# Patient Record
Sex: Female | Born: 1950 | Race: Black or African American | Hispanic: No | Marital: Single | Smoking: Former smoker
Health system: Southern US, Community
[De-identification: ages and names within clinical notes are randomized; demographics above are authoritative.]

## PROBLEM LIST (undated history)

## (undated) DIAGNOSIS — G629 Polyneuropathy, unspecified: Secondary | ICD-10-CM

## (undated) DIAGNOSIS — R0789 Other chest pain: Secondary | ICD-10-CM

## (undated) DIAGNOSIS — I10 Essential (primary) hypertension: Secondary | ICD-10-CM

## (undated) DIAGNOSIS — Z0000 Encounter for general adult medical examination without abnormal findings: Secondary | ICD-10-CM

## (undated) DIAGNOSIS — E1140 Type 2 diabetes mellitus with diabetic neuropathy, unspecified: Secondary | ICD-10-CM

## (undated) DIAGNOSIS — H409 Unspecified glaucoma: Secondary | ICD-10-CM

## (undated) DIAGNOSIS — F32A Depression, unspecified: Secondary | ICD-10-CM

## (undated) DIAGNOSIS — E785 Hyperlipidemia, unspecified: Secondary | ICD-10-CM

## (undated) DIAGNOSIS — A419 Sepsis, unspecified organism: Secondary | ICD-10-CM

## (undated) DIAGNOSIS — S82142A Displaced bicondylar fracture of left tibia, initial encounter for closed fracture: Secondary | ICD-10-CM

## (undated) DIAGNOSIS — Z1159 Encounter for screening for other viral diseases: Secondary | ICD-10-CM

## (undated) DIAGNOSIS — Z594 Lack of adequate food and safe drinking water: Secondary | ICD-10-CM

## (undated) DIAGNOSIS — Z1239 Encounter for other screening for malignant neoplasm of breast: Secondary | ICD-10-CM

## (undated) DIAGNOSIS — Z794 Long term (current) use of insulin: Principal | ICD-10-CM

## (undated) DIAGNOSIS — I219 Acute myocardial infarction, unspecified: Secondary | ICD-10-CM

## (undated) DIAGNOSIS — L0231 Cutaneous abscess of buttock: Secondary | ICD-10-CM

## (undated) DIAGNOSIS — M810 Age-related osteoporosis without current pathological fracture: Secondary | ICD-10-CM

## (undated) DIAGNOSIS — E1165 Type 2 diabetes mellitus with hyperglycemia: Principal | ICD-10-CM

## (undated) HISTORY — DX: Encounter for general adult medical examination without abnormal findings: Z00.00

## (undated) HISTORY — DX: Encounter for other screening for malignant neoplasm of breast: Z12.39

## (undated) HISTORY — DX: Polyneuropathy, unspecified: G62.9

## (undated) HISTORY — DX: Displaced bicondylar fracture of left tibia, initial encounter for closed fracture: S82.142A

## (undated) HISTORY — PX: EYE SURGERY: SHX253

## (undated) HISTORY — DX: Acute myocardial infarction, unspecified: I21.9

## (undated) HISTORY — DX: Hyperlipidemia, unspecified: E78.5

## (undated) HISTORY — DX: Long term (current) use of insulin: Z79.4

## (undated) HISTORY — DX: Encounter for screening for other viral diseases: Z11.59

## (undated) HISTORY — DX: Unspecified glaucoma: H40.9

## (undated) HISTORY — DX: Sepsis, unspecified organism: A41.9

## (undated) HISTORY — DX: Lack of adequate food and safe drinking water: Z59.4

## (undated) HISTORY — DX: Depression, unspecified: F32.A

## (undated) HISTORY — DX: Other chest pain: R07.89

## (undated) HISTORY — DX: Cutaneous abscess of buttock: L02.31

## (undated) HISTORY — DX: Type 2 diabetes mellitus with hyperglycemia: E11.65

## (undated) HISTORY — DX: Age-related osteoporosis without current pathological fracture: M81.0

## (undated) HISTORY — DX: Type 2 diabetes mellitus with diabetic neuropathy, unspecified: E11.40

---

## 2006-03-10 ENCOUNTER — Encounter: Admission: RE | Admit: 2006-03-10 | Discharge: 2006-03-10 | Payer: Self-pay | Admitting: Internal Medicine

## 2006-03-25 ENCOUNTER — Encounter: Admission: RE | Admit: 2006-03-25 | Discharge: 2006-03-25 | Payer: Self-pay | Admitting: Internal Medicine

## 2006-10-15 ENCOUNTER — Observation Stay (HOSPITAL_COMMUNITY): Admission: EM | Admit: 2006-10-15 | Discharge: 2006-10-16 | Payer: Self-pay | Admitting: Emergency Medicine

## 2006-12-31 ENCOUNTER — Encounter: Admission: RE | Admit: 2006-12-31 | Discharge: 2006-12-31 | Payer: Self-pay | Admitting: Internal Medicine

## 2008-09-23 ENCOUNTER — Emergency Department (HOSPITAL_COMMUNITY): Admission: EM | Admit: 2008-09-23 | Discharge: 2008-09-24 | Payer: Self-pay | Admitting: Emergency Medicine

## 2009-01-17 ENCOUNTER — Emergency Department (HOSPITAL_COMMUNITY): Admission: EM | Admit: 2009-01-17 | Discharge: 2009-01-17 | Payer: Self-pay | Admitting: Emergency Medicine

## 2010-03-18 ENCOUNTER — Encounter: Payer: Self-pay | Admitting: Internal Medicine

## 2010-06-03 LAB — DIFFERENTIAL
Band Neutrophils: 0 % (ref 0–10)
Basophils Relative: 0 % (ref 0–1)
Blasts: 0 %
Lymphs Abs: 1.7 10*3/uL (ref 0.7–4.0)
Neutrophils Relative %: 71 % (ref 43–77)
Promyelocytes Absolute: 0 %

## 2010-06-03 LAB — GLUCOSE, CAPILLARY
Glucose-Capillary: 245 mg/dL — ABNORMAL HIGH (ref 70–99)
Glucose-Capillary: 272 mg/dL — ABNORMAL HIGH (ref 70–99)

## 2010-06-03 LAB — POCT CARDIAC MARKERS
CKMB, poc: <1 ng/mL — ABNORMAL LOW (ref 1.0–8.0)
CKMB, poc: <1 ng/mL — ABNORMAL LOW (ref 1.0–8.0)
Myoglobin, poc: 40.6 ng/mL (ref 12–200)
Myoglobin, poc: 51.9 ng/mL (ref 12–200)
Troponin i, poc: <0.05 ng/mL (ref 0.00–0.09)

## 2010-06-03 LAB — BASIC METABOLIC PANEL
CO2: 25 mEq/L (ref 19–32)
GFR calc Af Amer: >60 mL/min (ref >60)
GFR calc non Af Amer: >60 mL/min (ref >60)
Sodium: 136 mEq/L (ref 135–145)

## 2010-06-03 LAB — CBC
Hemoglobin: 14.5 g/dL (ref 12.0–15.0)
MCHC: 33.8 g/dL (ref 30.0–36.0)
MCV: 85 fL (ref 78.0–100.0)
Platelets: 242 10*3/uL (ref 150–400)
WBC: 7.8 10*3/uL (ref 4.0–10.5)

## 2010-06-03 LAB — BRAIN NATRIURETIC PEPTIDE: Pro B Natriuretic peptide (BNP): <30 pg/mL (ref 0.0–100.0)

## 2010-07-10 NOTE — Discharge Summary (Signed)
Heather [name], Heather [name]                  ACCOUNT NO.:  [id number]   MEDICAL RECORD NO.:  [id number]          PATIENT TYPE:  INP   LOCATION:  3733                         FACILITY:  [facility]   PHYSICIAN:  Hillery Aldo, M.D.   DATE OF BIRTH:  07-28-1950   DATE OF ADMISSION:  10/14/2006  DATE OF DISCHARGE:  10/16/2006                               DISCHARGE SUMMARY   PRIMARY CARE PHYSICIAN:  Robyn N. Allyne Gee, M.D.   DISCHARGE DIAGNOSES:  1. Noncardiac chest pain.  2. Poorly controlled diabetes.  3. Hypertension.  4. Mild dyslipidemia.  5. Obesity.   DISCHARGE MEDICATIONS:  1. Aspirin 81 mg daily.  2. Janumet 50/500 1 tablet daily.  3. Hydrochlorothiazide 12.5 mg daily.  4. Glucotrol XL 10 mg daily.   CONSULTATIONS:  None.   BRIEF ADMISSION HISTORY AND PHYSICAL:  The patient is a 60 year old  female who presented to the hospital for evaluation secondary to chest  pain and dyspnea.  Chest pain was somewhat atypical and mild, rated at  2/10.  Dyspnea seemed to be precipitated by an argument with family  members and a bit of exertional activity.  Nevertheless, the patient was  concerned and presented to the hospital for evaluation, and due to her  risk factor profile, was admitted to rule out acute coronary syndrome.   PROCEDURES AND DIAGNOSTIC STUDIES:  Chest x-ray on October 14, 2006  showed no acute findings.   DISCHARGE LABORATORY DATA:  CBC and BMET were unremarkable.  D-dimer was  not elevated at 0.24.  Total cholesterol was 198, triglycerides 126, HDL  46, LDL 127.  Cardiac markers were negative, with the exception of  slight elevation of the total CK.  The MB fractions and troponin I were  negative for three sets.  Hemoglobin A1c was 9.7%.  TSH was 2.676.   HOSPITAL COURSE BY PROBLEM:  Problem 1.  Chest pain.  The patient's  chest pain was fairly atypical.  Nevertheless, she was started on  aspirin and put on Nitropaste.  Serial EKG readings were nonacute.  There were no  abnormalities noted on telemetry.  The patient was given a  fasting lipid panel to further risk stratify her and did have some mild  dyslipidemia.  It is felt that her chest pain was likely noncardiac and  atypical in nature, and after three sets of negative enzymes, the  patient was deemed stable for discharge.  She had no further complaints  of chest pain or dyspnea throughout the course of her hospitalization.  There was no evidence of pulmonary embolism, given her negative D-dimer,  or pneumothorax, given her chest x-ray findings.  Nevertheless, she does  have some risk factors, and we do recommend an outpatient Cardiolite or  stress test as determined by her primary care physician.   Problem 2.  Diabetes.  The patient has poor overall control of her  diabetes, given her hemoglobin A1c of 9.7%.  The patient states that she  had been off of treatment for approximately 3 years and only recently  reestablish treatment with a primary care physician.  She  was just  started on Janumet recently.  We will go ahead and add Glucotrol XL,  given her elevated blood glucose readings.   Problem 3.  Hypertension.  The patient's blood pressure is controlled on  her current dose of hydrochlorothiazide.   Problem 4.  Dyspnea.  The patient's dyspnea was felt to be due to  exertion in the setting of emotional upset.  She has had no further  complaints of dyspnea.  There is no evidence of pulmonary embolism or an  acute lung process.  Her lungs are clear by clinical exam.   Problem 5.  Mild dyslipidemia.  The patient's total cholesterol was 198  but her LDL does warrant treatment.  We will leave this to the  discretion of her primary care physician.   DISPOSITION:  The patient is stable for discharge home.  She is  instructed to call her primary care physician and set up a followup  hospital appointment with her for early next week.      Hillery Aldo, M.D.  Electronically Signed      CR/MEDQ  D:  10/16/2006  T:  10/17/2006  Job:  [id number]   cc:   Candyce Churn. Allyne Gee, M.D.

## 2010-07-10 NOTE — H&P (Signed)
Heather [name], Heather [name]                  ACCOUNT NO.:  [id number]   MEDICAL RECORD NO.:  [id number]          PATIENT TYPE:  INP   LOCATION:  3733                         FACILITY:  [facility]   PHYSICIAN:  Della Goo, M.D. DATE OF BIRTH:  09-21-1950   DATE OF ADMISSION:  10/14/2006  DATE OF DISCHARGE:                              HISTORY & PHYSICAL   ADDENDUM:   PHYSICAL EXAMINATION FINDINGS:  This is a 60 year old obese female in no  acute distress.  VITAL SIGNS:  Temperature 97.5, blood pressure 150/95, heart rate 104,  respirations 16, O2 saturations 95-96%.  HEENT EXAMINATION:  Normocephalic, atraumatic.  Pupils equally round,  reactive to light.  There is no scleral icterus.  Funduscopic benign.  Oropharynx is clear.  NECK:  Supple, full range of motion.  No thyromegaly, adenopathy,  jugular venous distention.  CARDIOVASCULAR:  Regular rate and rhythm.  No murmurs, gallops or rubs.  LUNGS:  Clear to auscultation bilaterally.  ABDOMEN:  Positive bowel sounds, soft, nontender, nondistended.  EXTREMITIES:  Without cyanosis, clubbing or edema.  NEUROLOGIC EXAMINATION:  Alert and oriented x3.  Nonfocal.   LABORATORY STUDIES:  Sodium 139, potassium 4.2, chloride 105,  bicarbonate 29, BUN 16, creatinine 0.7, glucose 204.  Hemoglobin 15.3,  hematocrit 45.0.  Cardiac enzymes:  Myoglobin 143, CK-MB 2.7, troponin  less than 0.05.  Pro time 12.7, INR 0.9.  EKG reveals a normal sinus  rhythm without acute ST-segment changes.  Chest x-ray reveals no active  disease process.   ASSESSMENT:  A 60 year old female being admitted with:   1. Shortness of breath.  2. Chest pain.  3. Type 2 diabetes mellitus.  4. Hypertension.   PLAN:  The patient will be admitted to a telemetry area and cardiac  enzymes will be performed.  Topical nitrates, aspirin, and oxygen  therapy have been ordered.  A D-dimer has also been ordered.  The  patient will continue on her regular medications except the Janumet  has  been held secondary to the metformin.  Sliding scale insulin coverage  has been ordered as needed.  DVT and GI prophylaxis also ordered.  Further workup will ensue pending results of the patient's studies.     Della Goo, M.D.  Electronically Signed    HJ/MEDQ  D:  10/15/2006  T:  10/17/2006  Job:  [id number]   cc:   Candyce Churn. Allyne Gee, M.D.

## 2010-07-10 NOTE — H&P (Signed)
Heather [name], Heather [name]                  ACCOUNT NO.:  [id number]   MEDICAL RECORD NO.:  [id number]          PATIENT TYPE:  INP   LOCATION:  3733                         FACILITY:  [facility]   PHYSICIAN:  Della Goo, M.D. DATE OF BIRTH:  1951-01-17   DATE OF ADMISSION:  10/14/2006  DATE OF DISCHARGE:                              HISTORY & PHYSICAL   PRIMARY CARE PHYSICIAN:  Dr. Dorothyann Peng.   CHIEF COMPLAINT:  Shortness of breath.   HISTORY OF PRESENT ILLNESS:  This is a 60 year old female presenting to  the emergency department secondary to complaints of severe shortness of  breath that started in the afternoon after excitement in her home.  The  patient reports having increased stressors and beginning to be severely  short of breath.  She was brought to the emergency department for  evaluation and subsequently began to have chest pain, which she  described as being mild, substernal, and rated the pain as being a 2/10/  she denies having any radiation of the pain.  She does have risk factors  of diabetes type 2 and hypertension along with a family history of  coronary artery disease, hypertension, diabetes in her mother.  The  patient is a nonsmoker.  She denies having any symptoms of nausea,  vomiting, diarrhea.  She denies having any syncope or seizure symptoms.  She does report having mild weakness.   PAST MEDICAL HISTORY:  1. History of type 2 diabetes mellitus.  2. Hypertension.   MEDICATIONS:  1. Hydrochlorothiazide 25 mg one p.o. daily.  2. Janumet 500/50 mg one p.o. q.p.m.   ALLERGIES:  No known drug allergies.   SOCIAL HISTORY:  The patient is at home with five adult children.  She  is a nonsmoker.  She reports having occasional rare alcohol, drinking  one mixed drink monthly.   FAMILY HISTORY:  As mentioned above.   REVIEW OF SYSTEMS:  Pertinents are mentioned above.   PHYSICAL EXAMINATION FINDINGS:  This is an obese 60 year old female in  no acute distress.  VITAL SIGNS:  Temperature   Dictation ended at this point.      Della Goo, M.D.  Electronically Signed     HJ/MEDQ  D:  10/15/2006  T:  10/17/2006  Job:  [id number]   cc:   Candyce Churn. Allyne Gee, M.D.

## 2010-12-07 LAB — CBC
MCV: 82.8
RBC: 4.39
WBC: 7.7

## 2010-12-07 LAB — CK TOTAL AND CKMB (NOT AT ARMC)
CK, MB: 3
Relative Index: 1.2
Total CK: 246 — ABNORMAL HIGH

## 2010-12-07 LAB — BASIC METABOLIC PANEL
BUN: 10
Chloride: 107
GFR calc Af Amer: >60
Potassium: 4.1
Sodium: 140

## 2010-12-07 LAB — CARDIAC PANEL(CRET KIN+CKTOT+MB+TROPI)
CK, MB: 2
Total CK: 266 — ABNORMAL HIGH
Total CK: 324 — ABNORMAL HIGH
Troponin I: 0.02

## 2010-12-07 LAB — POCT CARDIAC MARKERS: CKMB, poc: 2.7

## 2010-12-07 LAB — I-STAT 8, (EC8 V) (CONVERTED LAB)
BUN: 16
Glucose, Bld: 204 — ABNORMAL HIGH
HCT: 45
Operator id: 272551
pH, Ven: 7.371 — ABNORMAL HIGH

## 2010-12-07 LAB — HEMOGLOBIN A1C
Hgb A1c MFr Bld: 9.7 — ABNORMAL HIGH
Mean Plasma Glucose: 268

## 2010-12-07 LAB — LIPID PANEL
Cholesterol: 198
HDL: 46
Total CHOL/HDL Ratio: 4.3

## 2010-12-07 LAB — POCT I-STAT CREATININE: Operator id: 272551

## 2011-02-02 ENCOUNTER — Emergency Department (HOSPITAL_COMMUNITY)
Admission: EM | Admit: 2011-02-02 | Discharge: 2011-02-03 | Disposition: A | Discharge status: Home / Self Care | Payer: Managed Care, Other (non HMO) | Attending: Emergency Medicine | Admitting: Emergency Medicine

## 2011-02-02 ENCOUNTER — Other Ambulatory Visit: Payer: Self-pay

## 2011-02-02 ENCOUNTER — Encounter: Payer: Self-pay | Admitting: *Deleted

## 2011-02-02 DIAGNOSIS — R05 Cough: Secondary | ICD-10-CM | POA: Insufficient documentation

## 2011-02-02 DIAGNOSIS — R0602 Shortness of breath: Secondary | ICD-10-CM | POA: Insufficient documentation

## 2011-02-02 DIAGNOSIS — I10 Essential (primary) hypertension: Secondary | ICD-10-CM | POA: Insufficient documentation

## 2011-02-02 DIAGNOSIS — E119 Type 2 diabetes mellitus without complications: Secondary | ICD-10-CM | POA: Insufficient documentation

## 2011-02-02 DIAGNOSIS — R079 Chest pain, unspecified: Secondary | ICD-10-CM | POA: Insufficient documentation

## 2011-02-02 DIAGNOSIS — R059 Cough, unspecified: Secondary | ICD-10-CM | POA: Insufficient documentation

## 2011-02-02 HISTORY — DX: Essential (primary) hypertension: I10

## 2011-02-02 NOTE — ED Notes (Signed)
Patient with mild mid sternal chest pain that started earlier this afternoon at church.  Patient denies any other associated symptoms.

## 2011-02-03 ENCOUNTER — Encounter (HOSPITAL_COMMUNITY): Payer: Self-pay | Admitting: *Deleted

## 2011-02-03 ENCOUNTER — Emergency Department (HOSPITAL_COMMUNITY): Payer: Managed Care, Other (non HMO)

## 2011-02-03 LAB — BASIC METABOLIC PANEL
CO2: 26 mEq/L (ref 19–32)
Calcium: 9.4 mg/dL (ref 8.4–10.5)
GFR calc Af Amer: >90 mL/min (ref >90)
GFR calc non Af Amer: >90 mL/min (ref >90)
Sodium: 139 mEq/L (ref 135–145)

## 2011-02-03 LAB — CBC
MCH: 26.4 pg (ref 26.0–34.0)
Platelets: 256 10*3/uL (ref 150–400)
RBC: 4.92 MIL/uL (ref 3.87–5.11)

## 2011-02-03 LAB — POCT I-STAT TROPONIN I: Troponin i, poc: 0.01 ng/mL (ref 0.00–0.08)

## 2011-02-03 LAB — TROPONIN I: Troponin I: <0.3 ng/mL (ref <0.30)

## 2011-02-03 MED ORDER — HYDROCHLOROTHIAZIDE 12.5 MG PO CAPS
25.0000 mg | ORAL_CAPSULE | ORAL | Status: DC
  Filled 2011-02-03: qty 2

## 2011-02-03 MED ORDER — HYDROCHLOROTHIAZIDE 25 MG PO TABS
25.0000 mg | ORAL_TABLET | ORAL | Status: AC
  Administered 2011-02-03: 25 mg via ORAL
  Filled 2011-02-03 (×2): qty 1

## 2011-02-03 MED ORDER — METFORMIN HCL 500 MG PO TABS: 500.0000 mg | ORAL_TABLET | Freq: Two times a day (BID) | ORAL | Status: DC

## 2011-02-03 MED ORDER — HYDROCHLOROTHIAZIDE 25 MG PO TABS: 25.0000 mg | ORAL_TABLET | Freq: Every day | ORAL | Status: DC

## 2011-02-03 MED ORDER — METFORMIN HCL 500 MG PO TABS
500.0000 mg | ORAL_TABLET | ORAL | Status: AC
  Administered 2011-02-03: 500 mg via ORAL
  Filled 2011-02-03 (×2): qty 1

## 2011-02-03 NOTE — ED Provider Notes (Signed)
History     CSN: [id number] Arrival date & time: 02/02/2011 10:47 PM   First MD Initiated Contact with Patient 02/03/11 0151      Chief Complaint  Patient presents with  . Chest Pain    (Consider location/radiation/quality/duration/timing/severity/associated sxs/prior treatment) HPI This is a 60 year old black female with a history of hypertension and diabetes for which she has not receiving treatment. She had the onset of a mild left parasternal chest pain yesterday evening about 10 PM while at church. It was well localized and there was no radiation. There was no accompanying dyspnea, diaphoresis or nausea. There were no exacerbating or mitigating factors. It resolved on its own after about 2 hours. She denies other symptoms.  Past Medical History  Diagnosis Date  . Hypertension   . Diabetes mellitus     History reviewed. No pertinent past surgical history.  History reviewed. No pertinent family history.  History  Substance Use Topics  . Smoking status: Never Smoker   . Smokeless tobacco: Not on file  . Alcohol Use: No    OB History    Grav Para Term Preterm Abortions TAB SAB Ect Mult Living                  Review of Systems  All other systems reviewed and are negative.    Allergies  Review of patient's allergies indicates no known allergies.  Home Medications  No current outpatient prescriptions on file.  BP 158/96  Pulse 60  Temp(Src) 97.8 F (36.6 C) (Oral)  Resp 20  SpO2 96%  Physical Exam General: Well-developed, well-nourished female in no acute distress; appearance consistent with age of record HENT: normocephalic, atraumatic Eyes: pupils equal round and reactive to light; extraocular muscles intact Neck: supple Heart: regular rate and rhythm Lungs: clear to auscultation bilaterally Abdomen: soft; nontender; nondistended Extremities: No deformity; full range of motion; pulses normal Neurologic: Awake, alert and oriented; motor function  intact in all extremities and symmetric; no facial droop Skin: Warm and dry Psychiatric: Normal mood and affect    ED Course  Procedures (including critical care time)    MDM   Nursing notes and vitals signs, including pulse oximetry, reviewed.  Summary of this visit's results, reviewed by myself:  Labs:  Results for orders placed during the hospital encounter of 02/02/11  CBC      Component Value Range   WBC 7.0  4.0 - 10.5 (K/uL)   RBC 4.92  3.87 - 5.11 (MIL/uL)   Hemoglobin 13.0  12.0 - 15.0 (g/dL)   HCT 40.9  81.1 - 91.4 (%)   MCV 84.3  78.0 - 100.0 (fL)   MCH 26.4  26.0 - 34.0 (pg)   MCHC 31.3  30.0 - 36.0 (g/dL)   RDW 78.2  95.6 - 21.3 (%)   Platelets 256  150 - 400 (K/uL)  BASIC METABOLIC PANEL      Component Value Range   Sodium 139  135 - 145 (mEq/L)   Potassium 4.2  3.5 - 5.1 (mEq/L)   Chloride 104  96 - 112 (mEq/L)   CO2 26  19 - 32 (mEq/L)   Glucose, Bld 255 (*) 70 - 99 (mg/dL)   BUN 15  6 - 23 (mg/dL)   Creatinine, Ser 0.86  0.50 - 1.10 (mg/dL)   Calcium 9.4  8.4 - 57.8 (mg/dL)   GFR calc non Af Amer >90  >90 (mL/min)   GFR calc Af Amer >90  >90 (mL/min)  TROPONIN I  Component Value Range   Troponin I <0.30  <0.30 (ng/mL)  POCT I-STAT TROPONIN I      Component Value Range   Troponin i, poc 0.01  0.00 - 0.08 (ng/mL)   Comment 3           POCT I-STAT TROPONIN I      Component Value Range   Troponin i, poc 0.04  0.00 - 0.08 (ng/mL)   Comment 3           POCT I-STAT TROPONIN I      Component Value Range   Troponin i, poc 0.00  0.00 - 0.08 (ng/mL)   Comment 3             Imaging Studies: Dg Chest 2 View  02/03/2011  *RADIOLOGY REPORT*  Clinical Data: Chest pain, shortness of breath, and cough for 2 weeks.  CHEST - 2 VIEW  Comparison: 09/23/2008  Findings: The heart size and pulmonary vascularity are normal. The lungs appear clear and expanded without focal air space disease or consolidation. No blunting of the costophrenic angles.  Tortuous  aorta.  Degenerative changes in the spine.  No significant change since previous study.  IMPRESSION: No evidence of active pulmonary disease.  Original Report Authenticated By: Marlon Pel, M.D.   EKG Interpretation:  Date & Time: 02/03/2011 11:04 PM  Rate: 66  Rhythm: normal sinus rhythm  QRS Axis: normal  Intervals: normal  ST/T Wave abnormalities: normal  Conduction Disutrbances:none  Narrative Interpretation: abnormal R-wave progression  Old EKG Reviewed: unchanged  5:41 AM 3 sets of cardiac markers within normal limits. Patient has been asymptomatic in the ED. Will start patient on antihypertensive antihyperglycemic medications refer to [facility] that she needs establishment with a primary care physician. She was advised to return for worsening symptoms.      Hanley Seamen, MD 02/03/11 [phone]

## 2011-02-03 NOTE — ED Notes (Signed)
Comfort measures provided for family at bedside    

## 2011-02-03 NOTE — ED Notes (Signed)
CP currently resolved

## 2011-02-03 NOTE — ED Notes (Signed)
Pt shows no sign of neuro deficits

## 2011-11-04 ENCOUNTER — Encounter (HOSPITAL_COMMUNITY): Payer: Self-pay | Admitting: Family Medicine

## 2011-11-04 ENCOUNTER — Emergency Department (HOSPITAL_COMMUNITY)
Admission: EM | Admit: 2011-11-04 | Discharge: 2011-11-04 | Disposition: A | Discharge status: Home / Self Care | Payer: Managed Care, Other (non HMO) | Attending: Emergency Medicine | Admitting: Emergency Medicine

## 2011-11-04 DIAGNOSIS — I10 Essential (primary) hypertension: Secondary | ICD-10-CM | POA: Insufficient documentation

## 2011-11-04 DIAGNOSIS — L237 Allergic contact dermatitis due to plants, except food: Secondary | ICD-10-CM

## 2011-11-04 DIAGNOSIS — L255 Unspecified contact dermatitis due to plants, except food: Secondary | ICD-10-CM | POA: Insufficient documentation

## 2011-11-04 DIAGNOSIS — L259 Unspecified contact dermatitis, unspecified cause: Secondary | ICD-10-CM

## 2011-11-04 DIAGNOSIS — E119 Type 2 diabetes mellitus without complications: Secondary | ICD-10-CM | POA: Insufficient documentation

## 2011-11-04 MED ORDER — DEXAMETHASONE SODIUM PHOSPHATE 10 MG/ML IJ SOLN
10.0000 mg | Freq: Once | INTRAMUSCULAR | Status: AC
  Administered 2011-11-04: 10 mg via INTRAMUSCULAR
  Filled 2011-11-04: qty 1

## 2011-11-04 MED ORDER — PREDNISONE 20 MG PO TABS: ORAL_TABLET | ORAL | Status: AC

## 2011-11-04 MED ORDER — DIPHENHYDRAMINE HCL 25 MG PO CAPS
25.0000 mg | ORAL_CAPSULE | Freq: Once | ORAL | Status: AC
  Administered 2011-11-04: 25 mg via ORAL
  Filled 2011-11-04: qty 1

## 2011-11-04 NOTE — ED Notes (Signed)
Pt reports rash all over x 1 month. States she thinks it is poison oak or ivy, but is unsure. Reports severe itching.

## 2011-11-04 NOTE — ED Notes (Signed)
Rx given x1 Pt ambulating independently w/ steady gait on d/c in no acute distress, A&Ox4. D/c instructions reviewed w/ pt - pt denies any further questions or concerns at present.   

## 2011-11-04 NOTE — ED Provider Notes (Signed)
History     CSN: [id number]  Arrival date & time 11/04/11  1619   First MD Initiated Contact with Patient 11/04/11 1755      Chief Complaint  Patient presents with  . Rash    (Consider location/radiation/quality/duration/timing/severity/associated sxs/prior treatment) HPI Comments: 61 year old female presents to the emergency department with a rash on her arms, chest and abdomen that has come and gone for the past month. She states that one month ago she was working out in her yard where she has poison oak and developed a rash on her arms, chest and abdomen. The rash was very itchy. She tried taking Benadryl and applying A&D ointment without much relief. The rash began to go away until she started to work in her yard again a little over a week ago. Rash came back in the exact same spot. It is itchy but not painful. Denies any fever, chills, shortness of breath, trouble swallowing. Denies any new soaps, detergents, pets, recent travel or any contacts with similar rash.  Patient is a 61 y.o. female presenting with rash. The history is provided by the patient and a friend.  Rash     Past Medical History  Diagnosis Date  . Hypertension   . Diabetes mellitus     History reviewed. No pertinent past surgical history.  History reviewed. No pertinent family history.  History  Substance Use Topics  . Smoking status: Never Smoker   . Smokeless tobacco: Not on file  . Alcohol Use: No    OB History    Grav Para Term Preterm Abortions TAB SAB Ect Mult Living                  Review of Systems  Constitutional: Negative for fever and chills.  HENT: Negative for facial swelling, trouble swallowing, neck pain and neck stiffness.   Respiratory: Negative for shortness of breath.   Cardiovascular: Negative for chest pain.  Skin: Positive for rash.  Neurological: Negative for numbness.    Allergies  Review of patient's allergies indicates no known allergies.  Home Medications    Current Outpatient Rx  Name Route Sig Dispense Refill  . DIPHENHYDRAMINE HCL 25 MG PO TABS Oral Take 25 mg by mouth every 6 (six) hours as needed. For itching.    Marland Kitchen HYDROCHLOROTHIAZIDE 25 MG PO TABS Oral Take 25 mg by mouth daily.    Marland Kitchen METFORMIN HCL 500 MG PO TABS Oral Take 500 mg by mouth 2 (two) times daily with a meal.      BP 179/93  Pulse 79  Temp 98.6 F (37 C) (Oral)  Resp 18  SpO2 96%  Physical Exam  Constitutional: She is oriented to person, place, and time. She appears well-developed and well-nourished. No distress.  HENT:  Head: Normocephalic and atraumatic.  Mouth/Throat: Oropharynx is clear and moist. No oropharyngeal exudate.  Eyes: Conjunctivae are normal.  Neck: Normal range of motion. Neck supple.  Cardiovascular: Normal rate, regular rhythm and normal heart sounds.   Pulmonary/Chest: Effort normal and breath sounds normal.  Musculoskeletal: Normal range of motion.  Neurological: She is alert and oriented to person, place, and time.  Skin: Skin is warm and dry. Rash noted. Rash is maculopapular ( scattered on forearms bilaterally, chest and abdomen. no evidence of secondary infection) and urticarial (scattered on forearms bilaterally, chest and abdomen. no evidence of secondary infection). She is not diaphoretic.  Psychiatric: She has a normal mood and affect. Her behavior is normal.    ED  Course  Procedures (including critical care time)  Labs Reviewed - No data to display No results found.   1. Poison oak dermatitis   2. Contact dermatitis       MDM  61 year old female with poison oak. Shot of Decadron given in the ED. Will discharge with instructions to take Benadryl and a prednisone taper. Close return precautions discussed. There is no evidence of secondary infection on exam.        Trevor Mace, PA-C 11/04/11 1853

## 2011-11-05 NOTE — ED Provider Notes (Signed)
Medical screening examination/treatment/procedure(s) were performed by non-physician practitioner and as supervising physician I was immediately available for consultation/collaboration.   Gwyneth Sprout, MD 11/05/11 1339

## 2012-03-09 ENCOUNTER — Encounter (HOSPITAL_COMMUNITY): Payer: Self-pay | Admitting: *Deleted

## 2012-03-09 DIAGNOSIS — R51 Headache: Secondary | ICD-10-CM | POA: Insufficient documentation

## 2012-03-09 DIAGNOSIS — R5381 Other malaise: Secondary | ICD-10-CM | POA: Insufficient documentation

## 2012-03-09 DIAGNOSIS — R062 Wheezing: Secondary | ICD-10-CM | POA: Insufficient documentation

## 2012-03-09 DIAGNOSIS — IMO0001 Reserved for inherently not codable concepts without codable children: Secondary | ICD-10-CM | POA: Insufficient documentation

## 2012-03-09 DIAGNOSIS — J40 Bronchitis, not specified as acute or chronic: Secondary | ICD-10-CM | POA: Insufficient documentation

## 2012-03-09 DIAGNOSIS — R6883 Chills (without fever): Secondary | ICD-10-CM | POA: Insufficient documentation

## 2012-03-09 DIAGNOSIS — R002 Palpitations: Secondary | ICD-10-CM | POA: Insufficient documentation

## 2012-03-09 DIAGNOSIS — I10 Essential (primary) hypertension: Secondary | ICD-10-CM | POA: Insufficient documentation

## 2012-03-09 DIAGNOSIS — R0989 Other specified symptoms and signs involving the circulatory and respiratory systems: Secondary | ICD-10-CM | POA: Insufficient documentation

## 2012-03-09 DIAGNOSIS — M2550 Pain in unspecified joint: Secondary | ICD-10-CM | POA: Insufficient documentation

## 2012-03-09 DIAGNOSIS — R0789 Other chest pain: Secondary | ICD-10-CM | POA: Insufficient documentation

## 2012-03-09 DIAGNOSIS — R0602 Shortness of breath: Secondary | ICD-10-CM | POA: Insufficient documentation

## 2012-03-09 DIAGNOSIS — E119 Type 2 diabetes mellitus without complications: Secondary | ICD-10-CM | POA: Insufficient documentation

## 2012-03-09 NOTE — ED Notes (Signed)
Pt c/o cough, decreased appetite, and chills since Thursday.  Also c/o wheezing.

## 2012-03-10 ENCOUNTER — Emergency Department (HOSPITAL_COMMUNITY)
Admission: EM | Admit: 2012-03-10 | Discharge: 2012-03-10 | Disposition: A | Discharge status: Home / Self Care | Payer: BC Managed Care – PPO | Attending: Emergency Medicine | Admitting: Emergency Medicine

## 2012-03-10 ENCOUNTER — Emergency Department (HOSPITAL_COMMUNITY): Payer: BC Managed Care – PPO

## 2012-03-10 DIAGNOSIS — J40 Bronchitis, not specified as acute or chronic: Secondary | ICD-10-CM

## 2012-03-10 LAB — GLUCOSE, CAPILLARY: Glucose-Capillary: 260 mg/dL — ABNORMAL HIGH (ref 70–99)

## 2012-03-10 MED ORDER — ALBUTEROL SULFATE (5 MG/ML) 0.5% IN NEBU
2.5000 mg | INHALATION_SOLUTION | Freq: Once | RESPIRATORY_TRACT | Status: AC
  Administered 2012-03-10: 2.5 mg via RESPIRATORY_TRACT
  Filled 2012-03-10: qty 0.5

## 2012-03-10 MED ORDER — ALBUTEROL SULFATE HFA 108 (90 BASE) MCG/ACT IN AERS
2.0000 | INHALATION_SPRAY | RESPIRATORY_TRACT | Status: DC | PRN
  Administered 2012-03-10: 2 via RESPIRATORY_TRACT
  Filled 2012-03-10: qty 6.7

## 2012-03-10 MED ORDER — HYDROCOD POLST-CHLORPHEN POLST 10-8 MG/5ML PO LQCR: 5.0000 mL | Freq: Two times a day (BID) | ORAL | Status: DC

## 2012-03-10 MED ORDER — HYDROCOD POLST-CHLORPHEN POLST 10-8 MG/5ML PO LQCR
5.0000 mL | Freq: Once | ORAL | Status: AC
  Administered 2012-03-10: 5 mL via ORAL
  Filled 2012-03-10: qty 5

## 2012-03-10 NOTE — ED Notes (Signed)
Pt states does have ride home.

## 2012-03-10 NOTE — ED Provider Notes (Signed)
History     CSN: [id number]  Arrival date & time 03/09/12  2012   First MD Initiated Contact with Patient 03/10/12 0205      Chief Complaint  Patient presents with  . Cough  . Chills    (Consider location/radiation/quality/duration/timing/severity/associated sxs/prior treatment) HPI Comments: Pt states that she began feeling ill on Thursday with a cough.  It has since progressed to wheezing and shortness of breath with some difficulty breathing.  She denies rhinorrhea, nasal congestion, sputum production, or sore throat. She does state that she has felt chilled but does not know if she has run a fever, and she also has had diffuse body aches and a slight headache.    Patient is a 62 y.o. female presenting with cough. The history is provided by the patient.  Cough This is a new problem. The current episode started more than 2 days ago. The problem occurs every few minutes. The problem has been gradually worsening. The cough is non-productive. There has been no fever. Associated symptoms include chest pain, chills, headaches, myalgias, shortness of breath and wheezing. Pertinent negatives include no sweats, no ear congestion, no ear pain, no rhinorrhea, no sore throat and no eye redness. She has tried decongestants for the symptoms. The treatment provided no relief. She is not a smoker. Her past medical history does not include bronchitis, pneumonia, bronchiectasis, COPD, emphysema or asthma.    Past Medical History  Diagnosis Date  . Hypertension   . Diabetes mellitus     History reviewed. No pertinent past surgical history.  History reviewed. No pertinent family history.  History  Substance Use Topics  . Smoking status: Never Smoker   . Smokeless tobacco: Not on file  . Alcohol Use: No    OB History    Grav Para Term Preterm Abortions TAB SAB Ect Mult Living                  Review of Systems  Constitutional: Positive for chills and fatigue.  HENT: Negative for ear  pain, congestion, sore throat, rhinorrhea, sneezing, trouble swallowing and postnasal drip.   Eyes: Negative for discharge and redness.  Respiratory: Positive for cough, chest tightness, shortness of breath and wheezing. Negative for choking and stridor.   Cardiovascular: Positive for chest pain and palpitations. Negative for leg swelling.       Pt states that she feels like her heart has been racing since becoming sick.  Gastrointestinal: Negative.   Genitourinary: Negative.   Musculoskeletal: Positive for myalgias and arthralgias.  Skin: Negative.   Neurological: Positive for headaches. Negative for dizziness, syncope, weakness and light-headedness.  Psychiatric/Behavioral: Negative.   All other systems reviewed and are negative.    Allergies  Review of patient's allergies indicates no known allergies.  Home Medications   Current Outpatient Rx  Name  Route  Sig  Dispense  Refill  . HYDROCOD POLST-CPM POLST ER 10-8 MG/5ML PO LQCR   Oral   Take 5 mLs by mouth every 12 (twelve) hours.   140 mL   0     BP 156/89  Pulse 110  Temp 99.6 F (37.6 C) (Oral)  Resp 20  SpO2 100%  Physical Exam  Nursing note and vitals reviewed. Constitutional: She is oriented to person, place, and time. She appears well-developed and well-nourished. No distress.  HENT:  Head: Normocephalic and atraumatic.  Eyes: Conjunctivae normal are normal. Pupils are equal, round, and reactive to light.  Neck: Normal range of motion.  Cardiovascular:  Regular rhythm and normal heart sounds.        Pt was slightly tachycardic at the time of physical exam.  Pulmonary/Chest: No stridor. She has wheezes. She has rales.       Pt coughed every time she took a deep breath in; diffuse wheezes throughout bilaterally.  Abdominal: Soft. Bowel sounds are normal.  Neurological: She is alert and oriented to person, place, and time. No cranial nerve deficit.  Skin: Skin is warm and dry. She is not diaphoretic.    Psychiatric: She has a normal mood and affect.    ED Course  Procedures (including critical care time)  Labs Reviewed - No data to display Dg Chest 2 View  03/10/2012  *RADIOLOGY REPORT*  Clinical Data: Cough and wheezing.  CHEST - 2 VIEW  Comparison: 02/03/2011  Findings: Shallow inspiration. The heart size and pulmonary vascularity are normal. The lungs appear clear and expanded without focal air space disease or consolidation. No blunting of the costophrenic angles.No pneumothorax.  Mediastinal contours appear intact.  Degenerative changes in the spine.  No significant change since previous study.  IMPRESSION: No evidence of active pulmonary disease.   Original Report Authenticated By: Burman Nieves, M.D.      1. Bronchitis       MDM  Reviewed xray, no indication of pneumonia.  Patient received significant symptom relief with albuterol treatment and tussin cough medication.  Will DC home with same.         Arman Filter, NP 03/10/12 0416  Arman Filter, NP 03/10/12 [phone]

## 2012-03-10 NOTE — ED Notes (Signed)
Patient transported to X-ray 

## 2012-03-11 NOTE — ED Provider Notes (Signed)
Medical screening examination/treatment/procedure(s) were conducted as a shared visit with non-physician practitioner(s) and myself.  I personally evaluated the patient during the encounter.  Patient seen with Sharen Hones, M.D. Workup evaluated and patient treated for bronchitis.  Gilda Crease, MD 03/11/12 [phone]

## 2015-02-15 ENCOUNTER — Emergency Department (HOSPITAL_COMMUNITY): Payer: BLUE CROSS/BLUE SHIELD

## 2015-02-15 ENCOUNTER — Inpatient Hospital Stay (HOSPITAL_COMMUNITY)
Admission: EM | Admit: 2015-02-15 | Discharge: 2015-03-01 | DRG: 493 | Disposition: A | Discharge status: Home Health | Payer: BLUE CROSS/BLUE SHIELD | Attending: Orthopaedic Surgery | Admitting: Orthopaedic Surgery

## 2015-02-15 ENCOUNTER — Encounter (HOSPITAL_COMMUNITY): Payer: Self-pay | Admitting: *Deleted

## 2015-02-15 DIAGNOSIS — D62 Acute posthemorrhagic anemia: Secondary | ICD-10-CM | POA: Diagnosis not present

## 2015-02-15 DIAGNOSIS — Z23 Encounter for immunization: Secondary | ICD-10-CM

## 2015-02-15 DIAGNOSIS — Z419 Encounter for procedure for purposes other than remedying health state, unspecified: Secondary | ICD-10-CM

## 2015-02-15 DIAGNOSIS — E119 Type 2 diabetes mellitus without complications: Secondary | ICD-10-CM | POA: Diagnosis present

## 2015-02-15 DIAGNOSIS — S82252B Displaced comminuted fracture of shaft of left tibia, initial encounter for open fracture type I or II: Secondary | ICD-10-CM | POA: Diagnosis not present

## 2015-02-15 DIAGNOSIS — S8992XA Unspecified injury of left lower leg, initial encounter: Secondary | ICD-10-CM | POA: Diagnosis not present

## 2015-02-15 DIAGNOSIS — S82202B Unspecified fracture of shaft of left tibia, initial encounter for open fracture type I or II: Secondary | ICD-10-CM

## 2015-02-15 DIAGNOSIS — S82142A Displaced bicondylar fracture of left tibia, initial encounter for closed fracture: Secondary | ICD-10-CM

## 2015-02-15 DIAGNOSIS — W3400XA Accidental discharge from unspecified firearms or gun, initial encounter: Secondary | ICD-10-CM

## 2015-02-15 DIAGNOSIS — I10 Essential (primary) hypertension: Secondary | ICD-10-CM | POA: Diagnosis present

## 2015-02-15 HISTORY — PX: OTHER SURGICAL HISTORY: SHX169

## 2015-02-15 MED ORDER — SODIUM CHLORIDE 0.9 % IV BOLUS (SEPSIS)
1000.0000 mL | Freq: Once | INTRAVENOUS | Status: AC
  Administered 2015-02-15: 1000 mL via INTRAVENOUS

## 2015-02-15 MED ORDER — HYDROMORPHONE HCL 1 MG/ML IJ SOLN
1.0000 mg | Freq: Once | INTRAMUSCULAR | Status: AC
  Administered 2015-02-15: 1 mg via INTRAVENOUS
  Filled 2015-02-15: qty 1

## 2015-02-15 MED ORDER — TETANUS-DIPHTH-ACELL PERTUSSIS 5-2.5-18.5 LF-MCG/0.5 IM SUSP
0.5000 mL | Freq: Once | INTRAMUSCULAR | Status: AC
  Administered 2015-02-16: 0.5 mL via INTRAMUSCULAR
  Filled 2015-02-15: qty 0.5
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
## 2015-02-15 NOTE — ED Notes (Signed)
Tourniquet removed at 2350; combat gauze applied

## 2015-02-16 ENCOUNTER — Emergency Department (HOSPITAL_COMMUNITY): Payer: BLUE CROSS/BLUE SHIELD | Admitting: Anesthesiology

## 2015-02-16 ENCOUNTER — Inpatient Hospital Stay (HOSPITAL_COMMUNITY): Payer: BLUE CROSS/BLUE SHIELD

## 2015-02-16 ENCOUNTER — Emergency Department (HOSPITAL_COMMUNITY): Payer: BLUE CROSS/BLUE SHIELD

## 2015-02-16 ENCOUNTER — Encounter (HOSPITAL_COMMUNITY): Payer: Self-pay | Admitting: General Practice

## 2015-02-16 ENCOUNTER — Encounter (HOSPITAL_COMMUNITY)
Admission: EM | Disposition: A | Discharge status: Home Health | Payer: Self-pay | Source: Home / Self Care | Attending: Orthopaedic Surgery

## 2015-02-16 DIAGNOSIS — S82142A Displaced bicondylar fracture of left tibia, initial encounter for closed fracture: Secondary | ICD-10-CM | POA: Diagnosis present

## 2015-02-16 DIAGNOSIS — E119 Type 2 diabetes mellitus without complications: Secondary | ICD-10-CM | POA: Diagnosis present

## 2015-02-16 DIAGNOSIS — S82252B Displaced comminuted fracture of shaft of left tibia, initial encounter for open fracture type I or II: Secondary | ICD-10-CM | POA: Diagnosis present

## 2015-02-16 DIAGNOSIS — Z23 Encounter for immunization: Secondary | ICD-10-CM | POA: Diagnosis not present

## 2015-02-16 DIAGNOSIS — D62 Acute posthemorrhagic anemia: Secondary | ICD-10-CM | POA: Diagnosis not present

## 2015-02-16 DIAGNOSIS — I10 Essential (primary) hypertension: Secondary | ICD-10-CM | POA: Diagnosis present

## 2015-02-16 DIAGNOSIS — S8992XA Unspecified injury of left lower leg, initial encounter: Secondary | ICD-10-CM | POA: Diagnosis present

## 2015-02-16 HISTORY — DX: Displaced bicondylar fracture of left tibia, initial encounter for closed fracture: S82.142A

## 2015-02-16 HISTORY — PX: I & D EXTREMITY: SHX5045

## 2015-02-16 LAB — CREATININE, SERUM
CREATININE: 0.66 mg/dL (ref 0.44–1.00)
GFR calc Af Amer: >60 mL/min (ref >60)

## 2015-02-16 LAB — COMPREHENSIVE METABOLIC PANEL
ALBUMIN: 3.5 g/dL (ref 3.5–5.0)
ALK PHOS: 79 U/L (ref 38–126)
ALT: 16 U/L (ref 14–54)
AST: 17 U/L (ref 15–41)
Anion gap: 14 (ref 5–15)
BILIRUBIN TOTAL: 0.3 mg/dL (ref 0.3–1.2)
BUN: 10 mg/dL (ref 6–20)
CO2: 22 mmol/L (ref 22–32)
CREATININE: 0.82 mg/dL (ref 0.44–1.00)
Calcium: 9.6 mg/dL (ref 8.9–10.3)
Chloride: 102 mmol/L (ref 101–111)
GFR calc Af Amer: >60 mL/min (ref >60)
GLUCOSE: 322 mg/dL — AB (ref 65–99)
Potassium: 3.2 mmol/L — ABNORMAL LOW (ref 3.5–5.1)
Sodium: 138 mmol/L (ref 135–145)
TOTAL PROTEIN: 7.2 g/dL (ref 6.5–8.1)

## 2015-02-16 LAB — CBC
HCT: 31.4 % — ABNORMAL LOW (ref 36.0–46.0)
HCT: 40.1 % (ref 36.0–46.0)
Hemoglobin: 12.9 g/dL (ref 12.0–15.0)
Hemoglobin: 9.9 g/dL — ABNORMAL LOW (ref 12.0–15.0)
MCH: 27.2 pg (ref 26.0–34.0)
MCH: 27.7 pg (ref 26.0–34.0)
MCHC: 31.5 g/dL (ref 30.0–36.0)
MCHC: 32.2 g/dL (ref 30.0–36.0)
MCV: 86.1 fL (ref 78.0–100.0)
MCV: 86.3 fL (ref 78.0–100.0)
PLATELETS: 255 10*3/uL (ref 150–400)
PLATELETS: 292 10*3/uL (ref 150–400)
RBC: 3.64 MIL/uL — ABNORMAL LOW (ref 3.87–5.11)
RBC: 4.66 MIL/uL (ref 3.87–5.11)
RDW: 14.2 % (ref 11.5–15.5)
RDW: 14.4 % (ref 11.5–15.5)
WBC: 10.1 10*3/uL (ref 4.0–10.5)
WBC: 10.5 10*3/uL (ref 4.0–10.5)

## 2015-02-16 LAB — GLUCOSE, CAPILLARY
GLUCOSE-CAPILLARY: 256 mg/dL — AB (ref 65–99)
Glucose-Capillary: 237 mg/dL — ABNORMAL HIGH (ref 65–99)

## 2015-02-16 LAB — SAMPLE TO BLOOD BANK

## 2015-02-16 LAB — CDS SEROLOGY

## 2015-02-16 LAB — I-STAT CG4 LACTIC ACID, ED: Lactic Acid, Venous: 4.06 mmol/L (ref 0.5–2.0)

## 2015-02-16 LAB — PROTIME-INR
INR: 0.99 (ref 0.00–1.49)
Prothrombin Time: 13.3 seconds (ref 11.6–15.2)

## 2015-02-16 SURGERY — IRRIGATION AND DEBRIDEMENT EXTREMITY
Anesthesia: General | Site: Leg Lower | Laterality: Left

## 2015-02-16 MED ORDER — SODIUM CHLORIDE 0.9 % IV SOLN
INTRAVENOUS | Status: DC | PRN
  Administered 2015-02-16: 05:00:00 via INTRAVENOUS

## 2015-02-16 MED ORDER — ACETAMINOPHEN 500 MG PO TABS
1000.0000 mg | ORAL_TABLET | Freq: Once | ORAL | Status: DC
  Filled 2015-02-16: qty 2

## 2015-02-16 MED ORDER — PROPOFOL 10 MG/ML IV BOLUS
INTRAVENOUS | Status: AC
  Filled 2015-02-16: qty 20

## 2015-02-16 MED ORDER — CEFAZOLIN SODIUM 1-5 GM-% IV SOLN
1.0000 g | Freq: Four times a day (QID) | INTRAVENOUS | Status: AC
  Administered 2015-02-16 – 2015-02-17 (×3): 1 g via INTRAVENOUS
  Filled 2015-02-16 (×4): qty 50

## 2015-02-16 MED ORDER — ACETAMINOPHEN 325 MG PO TABS
650.0000 mg | ORAL_TABLET | Freq: Four times a day (QID) | ORAL | Status: DC | PRN
  Administered 2015-02-19: 650 mg via ORAL
  Filled 2015-02-16: qty 2

## 2015-02-16 MED ORDER — MORPHINE SULFATE (PF) 2 MG/ML IV SOLN: 2.0000 mg | INTRAVENOUS | Status: DC | PRN

## 2015-02-16 MED ORDER — METHOCARBAMOL 500 MG PO TABS
500.0000 mg | ORAL_TABLET | Freq: Four times a day (QID) | ORAL | Status: DC | PRN
  Administered 2015-02-16 – 2015-02-19 (×5): 500 mg via ORAL
  Filled 2015-02-16 (×5): qty 1

## 2015-02-16 MED ORDER — METOCLOPRAMIDE HCL 5 MG/ML IJ SOLN: 5.0000 mg | Freq: Three times a day (TID) | INTRAMUSCULAR | Status: DC | PRN

## 2015-02-16 MED ORDER — HYDROMORPHONE HCL 1 MG/ML IJ SOLN
0.2500 mg | INTRAMUSCULAR | Status: DC | PRN
  Administered 2015-02-16: 0.5 mg via INTRAVENOUS

## 2015-02-16 MED ORDER — METHOCARBAMOL 1000 MG/10ML IJ SOLN
500.0000 mg | Freq: Four times a day (QID) | INTRAMUSCULAR | Status: DC | PRN
  Filled 2015-02-16: qty 5

## 2015-02-16 MED ORDER — LACTATED RINGERS IV SOLN
INTRAVENOUS | Status: DC
  Administered 2015-02-16 – 2015-02-19 (×3): via INTRAVENOUS

## 2015-02-16 MED ORDER — PHENYLEPHRINE HCL 10 MG/ML IJ SOLN
INTRAMUSCULAR | Status: DC | PRN
  Administered 2015-02-16 (×2): 40 ug via INTRAVENOUS

## 2015-02-16 MED ORDER — ACETAMINOPHEN 650 MG RE SUPP: 650.0000 mg | Freq: Four times a day (QID) | RECTAL | Status: DC | PRN

## 2015-02-16 MED ORDER — SODIUM CHLORIDE 0.9 % IR SOLN
Status: DC | PRN
  Administered 2015-02-16 (×2): 1000 mL

## 2015-02-16 MED ORDER — METOCLOPRAMIDE HCL 5 MG PO TABS
5.0000 mg | ORAL_TABLET | Freq: Three times a day (TID) | ORAL | Status: DC | PRN
  Administered 2015-02-17: 10 mg via ORAL
  Filled 2015-02-16: qty 2

## 2015-02-16 MED ORDER — OXYCODONE HCL 5 MG PO TABS
5.0000 mg | ORAL_TABLET | ORAL | Status: DC | PRN
  Administered 2015-02-16 – 2015-03-01 (×42): 10 mg via ORAL
  Filled 2015-02-16 (×37): qty 2
  Filled 2015-02-16: qty 1

## 2015-02-16 MED ORDER — FENTANYL CITRATE (PF) 250 MCG/5ML IJ SOLN
INTRAMUSCULAR | Status: DC | PRN
  Administered 2015-02-16: 50 ug via INTRAVENOUS

## 2015-02-16 MED ORDER — PROPOFOL 10 MG/ML IV BOLUS
INTRAVENOUS | Status: DC | PRN
  Administered 2015-02-16: 110 mg via INTRAVENOUS

## 2015-02-16 MED ORDER — INSULIN ASPART 100 UNIT/ML ~~LOC~~ SOLN
0.0000 [IU] | Freq: Three times a day (TID) | SUBCUTANEOUS | Status: DC
  Administered 2015-02-17 (×2): 5 [IU] via SUBCUTANEOUS

## 2015-02-16 MED ORDER — ONDANSETRON HCL 4 MG/2ML IJ SOLN: 4.0000 mg | Freq: Four times a day (QID) | INTRAMUSCULAR | Status: DC | PRN

## 2015-02-16 MED ORDER — SUCCINYLCHOLINE CHLORIDE 20 MG/ML IJ SOLN
INTRAMUSCULAR | Status: DC | PRN
  Administered 2015-02-16: 100 mg via INTRAVENOUS

## 2015-02-16 MED ORDER — MIDAZOLAM HCL 2 MG/2ML IJ SOLN
INTRAMUSCULAR | Status: AC
  Filled 2015-02-16: qty 2

## 2015-02-16 MED ORDER — ONDANSETRON HCL 4 MG PO TABS: 4.0000 mg | ORAL_TABLET | Freq: Four times a day (QID) | ORAL | Status: DC | PRN

## 2015-02-16 MED ORDER — HYDROMORPHONE HCL 1 MG/ML IJ SOLN
INTRAMUSCULAR | Status: AC
  Administered 2015-02-16: 08:00:00
  Filled 2015-02-16: qty 1

## 2015-02-16 MED ORDER — HYDROMORPHONE HCL 1 MG/ML IJ SOLN
1.0000 mg | Freq: Once | INTRAMUSCULAR | Status: AC
  Administered 2015-02-16: 1 mg via INTRAVENOUS
  Filled 2015-02-16: qty 1

## 2015-02-16 MED ORDER — ENOXAPARIN SODIUM 40 MG/0.4ML ~~LOC~~ SOLN
40.0000 mg | SUBCUTANEOUS | Status: DC
  Administered 2015-02-17 – 2015-02-21 (×5): 40 mg via SUBCUTANEOUS
  Filled 2015-02-16 (×6): qty 0.4

## 2015-02-16 MED ORDER — INSULIN ASPART 100 UNIT/ML ~~LOC~~ SOLN
0.0000 [IU] | Freq: Every day | SUBCUTANEOUS | Status: DC
  Administered 2015-02-16: 2 [IU] via SUBCUTANEOUS

## 2015-02-16 MED ORDER — INSULIN ASPART 100 UNIT/ML ~~LOC~~ SOLN
SUBCUTANEOUS | Status: AC
  Administered 2015-02-16: 6 [IU] via SUBCUTANEOUS
  Filled 2015-02-16: qty 6

## 2015-02-16 MED ORDER — FENTANYL CITRATE (PF) 250 MCG/5ML IJ SOLN
INTRAMUSCULAR | Status: AC
  Filled 2015-02-16: qty 5

## 2015-02-16 MED ORDER — LIDOCAINE HCL (CARDIAC) 20 MG/ML IV SOLN
INTRAVENOUS | Status: AC
  Filled 2015-02-16: qty 5

## 2015-02-16 MED ORDER — CEFAZOLIN SODIUM-DEXTROSE 2-3 GM-% IV SOLR
INTRAVENOUS | Status: DC | PRN
  Administered 2015-02-16: 2 g via INTRAVENOUS

## 2015-02-16 MED ORDER — MIDAZOLAM HCL 2 MG/2ML IJ SOLN
INTRAMUSCULAR | Status: DC | PRN
  Administered 2015-02-16: 2 mg via INTRAVENOUS

## 2015-02-16 SURGICAL SUPPLY — 84 items
BAG DECANTER FOR FLEXI CONT (MISCELLANEOUS) ×1 IMPLANT
BANDAGE ELASTIC 4 VELCRO ST LF (GAUZE/BANDAGES/DRESSINGS) ×1 IMPLANT
BANDAGE ELASTIC 6 VELCRO ST LF (GAUZE/BANDAGES/DRESSINGS) ×1 IMPLANT
BNDG CMPR MED 15X6 ELC VLCR LF (GAUZE/BANDAGES/DRESSINGS) ×2
BNDG COHESIVE 4X5 TAN STRL (GAUZE/BANDAGES/DRESSINGS) ×1 IMPLANT
BNDG COHESIVE 6X5 TAN STRL LF (GAUZE/BANDAGES/DRESSINGS) ×2 IMPLANT
BNDG ELASTIC 6X15 VLCR STRL LF (GAUZE/BANDAGES/DRESSINGS) ×3 IMPLANT
BNDG GAUZE ELAST 4 BULKY (GAUZE/BANDAGES/DRESSINGS) ×3 IMPLANT
CONT SPEC 4OZ CLIKSEAL STRL BL (MISCELLANEOUS) ×2 IMPLANT
COTTON STERILE ROLL (GAUZE/BANDAGES/DRESSINGS) ×2 IMPLANT
COVER SURGICAL LIGHT HANDLE (MISCELLANEOUS) ×3 IMPLANT
CUFF TOURNIQUET SINGLE 18IN (TOURNIQUET CUFF) ×1 IMPLANT
CUFF TOURNIQUET SINGLE 24IN (TOURNIQUET CUFF) IMPLANT
CUFF TOURNIQUET SINGLE 34IN LL (TOURNIQUET CUFF) IMPLANT
CUFF TOURNIQUET SINGLE 44IN (TOURNIQUET CUFF) IMPLANT
DRAPE C-ARMOR (DRAPES) ×2 IMPLANT
DRAPE EXTREMITY T 121X128X90 (DRAPE) ×6 IMPLANT
DRAPE INCISE IOBAN 66X45 STRL (DRAPES) IMPLANT
DRAPE OEC MINIVIEW 54X84 (DRAPES) IMPLANT
DRAPE U-SHAPE 47X51 STRL (DRAPES) ×3 IMPLANT
DRSG ADAPTIC 3X8 NADH LF (GAUZE/BANDAGES/DRESSINGS) ×3 IMPLANT
DRSG EMULSION OIL 3X3 NADH (GAUZE/BANDAGES/DRESSINGS) ×1 IMPLANT
DRSG PAD ABDOMINAL 8X10 ST (GAUZE/BANDAGES/DRESSINGS) ×6 IMPLANT
DURAPREP 26ML APPLICATOR (WOUND CARE) ×3 IMPLANT
ELECT PENCIL ROCKER SW 15FT (MISCELLANEOUS) ×1 IMPLANT
ELECT REM PT RETURN 9FT ADLT (ELECTROSURGICAL) ×3
ELECTRODE REM PT RTRN 9FT ADLT (ELECTROSURGICAL) ×2 IMPLANT
GAUZE SPONGE 4X4 12PLY STRL (GAUZE/BANDAGES/DRESSINGS) ×1 IMPLANT
GAUZE XEROFORM 5X9 LF (GAUZE/BANDAGES/DRESSINGS) ×1 IMPLANT
GLOVE BIO SURGEON STRL SZ8 (GLOVE) ×3 IMPLANT
GLOVE BIOGEL PI IND STRL 6.5 (GLOVE) ×1 IMPLANT
GLOVE BIOGEL PI IND STRL 8 (GLOVE) ×2 IMPLANT
GLOVE BIOGEL PI IND STRL 8.5 (GLOVE) ×3 IMPLANT
GLOVE BIOGEL PI INDICATOR 6.5 (GLOVE) ×1
GLOVE BIOGEL PI INDICATOR 8 (GLOVE)
GLOVE BIOGEL PI INDICATOR 8.5 (GLOVE) ×2
GLOVE ORTHO TXT STRL SZ7.5 (GLOVE) ×1 IMPLANT
GLOVE SS BIOGEL STRL SZ 8.5 (GLOVE) ×2 IMPLANT
GLOVE SUPERSENSE BIOGEL SZ 8.5 (GLOVE) ×1
GLOVE SURG SS PI 7.5 STRL IVOR (GLOVE) ×4 IMPLANT
GLOVE SURG SS PI 8.5 STRL IVOR (GLOVE) ×1
GLOVE SURG SS PI 8.5 STRL STRW (GLOVE) ×1 IMPLANT
GOWN STRL REUS W/ TWL LRG LVL3 (GOWN DISPOSABLE) ×1 IMPLANT
GOWN STRL REUS W/TWL 2XL LVL3 (GOWN DISPOSABLE) ×6 IMPLANT
GOWN STRL REUS W/TWL LRG LVL3 (GOWN DISPOSABLE)
HANDPIECE INTERPULSE COAX TIP (DISPOSABLE)
IMMOBILIZER KNEE 24 THIGH 36 (MISCELLANEOUS) ×1 IMPLANT
IMMOBILIZER KNEE 24 UNIV (MISCELLANEOUS) ×3
KIT BASIN OR (CUSTOM PROCEDURE TRAY) ×3 IMPLANT
KIT ROOM TURNOVER OR (KITS) ×3 IMPLANT
MANIFOLD NEPTUNE II (INSTRUMENTS) ×3 IMPLANT
NDL HYPO 25GX1X1/2 BEV (NEEDLE) ×1 IMPLANT
NEEDLE HYPO 25GX1X1/2 BEV (NEEDLE) ×3 IMPLANT
NS IRRIG 1000ML POUR BTL (IV SOLUTION) ×3 IMPLANT
PACK ORTHO EXTREMITY (CUSTOM PROCEDURE TRAY) ×3 IMPLANT
PAD ARMBOARD 7.5X6 YLW CONV (MISCELLANEOUS) ×6 IMPLANT
PAD CAST 4YDX4 CTTN HI CHSV (CAST SUPPLIES) ×2 IMPLANT
PADDING CAST COTTON 4X4 STRL (CAST SUPPLIES) ×3
PENCIL BUTTON HOLSTER BLD 10FT (ELECTRODE) ×3 IMPLANT
SET HNDPC FAN SPRY TIP SCT (DISPOSABLE) IMPLANT
SPONGE GAUZE 4X4 12PLY STER LF (GAUZE/BANDAGES/DRESSINGS) ×2 IMPLANT
SPONGE LAP 18X18 X RAY DECT (DISPOSABLE) ×1 IMPLANT
SPONGE LAP 4X18 X RAY DECT (DISPOSABLE) ×1 IMPLANT
STOCKINETTE 6  STRL (DRAPES)
STOCKINETTE 6 STRL (DRAPES) ×1 IMPLANT
STOCKINETTE IMPERVIOUS 9X36 MD (GAUZE/BANDAGES/DRESSINGS) ×3 IMPLANT
SUCTION FRAZIER TIP 10 FR DISP (SUCTIONS) ×1 IMPLANT
SURGIFLO W/THROMBIN 8M KIT (HEMOSTASIS) IMPLANT
SUT BONE WAX W31G (SUTURE) ×1 IMPLANT
SUT ETHILON 3 0 PS 1 (SUTURE) ×1 IMPLANT
SUT ETHILON 4 0 PS 2 18 (SUTURE) ×4 IMPLANT
SUT PDS 0 CT 1 18  CR/8 (SUTURE) ×2 IMPLANT
SUT PROLENE 2 0 CT 1 (SUTURE) ×2 IMPLANT
SUT VIC AB 2-0 CT1 18 (SUTURE) ×1 IMPLANT
SUT VIC AB 2-0 CTB1 (SUTURE) ×1 IMPLANT
SYR BULB IRRIGATION 50ML (SYRINGE) ×2 IMPLANT
SYR CONTROL 10ML LL (SYRINGE) ×3 IMPLANT
TOWEL OR 17X24 6PK STRL BLUE (TOWEL DISPOSABLE) ×3 IMPLANT
TOWEL OR 17X26 10 PK STRL BLUE (TOWEL DISPOSABLE) ×3 IMPLANT
TUBE ANAEROBIC SPECIMEN COL (MISCELLANEOUS) IMPLANT
TUBE CONNECTING 12X1/4 (SUCTIONS) ×3 IMPLANT
UNDERPAD 30X30 INCONTINENT (UNDERPADS AND DIAPERS) ×3 IMPLANT
WATER STERILE IRR 1000ML POUR (IV SOLUTION) ×1 IMPLANT
YANKAUER SUCT BULB TIP NO VENT (SUCTIONS) ×3 IMPLANT
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
## 2015-02-16 NOTE — Progress Notes (Signed)
Inpatient Diabetes Program Recommendations  AACE/ADA: New Consensus Statement on Inpatient Glycemic Control (2015)  Target Ranges:  Prepandial:   less than 140 mg/dL      Peak postprandial:   less than 180 mg/dL (1-2 hours)      Critically ill patients:  140 - 180 mg/dL   02/16/15 Results for Heather [name], Heather [name] (MRN [id number]) as of 02/16/2015 09:30  Ref. Range 02/16/2015 00:22 02/16/2015 01:38 02/16/2015 05:45 02/16/2015 06:11 02/16/2015 07:36  Glucose-Capillary Latest Ref Range: 65-99 mg/dL   256 (H)    Noted hx of DM.  While NPO please consider moderate scale insulin every 4 hours.  Transition to  TID AC and HS when eating.  Also consider obtaining HgbA1c to assess glycemic control.  Branson West, CDE. M.Ed. Pager [phone] Inpatient Diabetes Coordinator

## 2015-02-16 NOTE — ED Notes (Signed)
Consent signed and at bedside  
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
## 2015-02-16 NOTE — Anesthesia Preprocedure Evaluation (Signed)
Anesthesia Evaluation  Patient identified by MRN, date of birth, ID band Patient awake    Reviewed: Allergy & Precautions, H&P , NPO status , Patient's Chart, lab work & pertinent test results  Airway Mallampati: II  TM Distance: >3 FB Neck ROM: Full    Dental no notable dental hx. (+) Poor Dentition, Dental Advisory Given   Pulmonary neg pulmonary ROS,    Pulmonary exam normal breath sounds clear to auscultation       Cardiovascular hypertension, Pt. on medications  Rhythm:Regular Rate:Normal     Neuro/Psych negative neurological ROS  negative psych ROS   GI/Hepatic negative GI ROS, Neg liver ROS,   Endo/Other  diabetes, Type 2, Oral Hypoglycemic Agents  Renal/GU negative Renal ROS  negative genitourinary   Musculoskeletal   Abdominal   Peds  Hematology negative hematology ROS (+)   Anesthesia Other Findings   Reproductive/Obstetrics negative OB ROS                             Anesthesia Physical Anesthesia Plan  ASA: II and emergent  Anesthesia Plan: General   Post-op Pain Management:    Induction: Intravenous, Rapid sequence and Cricoid pressure planned  Airway Management Planned: Oral ETT  Additional Equipment:   Intra-op Plan:   Post-operative Plan: Extubation in OR  Informed Consent: I have reviewed the patients History and Physical, chart, labs and discussed the procedure including the risks, benefits and alternatives for the proposed anesthesia with the patient or authorized representative who has indicated his/her understanding and acceptance.   Dental advisory given  Plan Discussed with: CRNA  Anesthesia Plan Comments:         Anesthesia Quick Evaluation

## 2015-02-16 NOTE — Transfer of Care (Signed)
Immediate Anesthesia Transfer of Care Note  Patient: Heather [name]  Procedure(s) Performed: Procedure(s): IRRIGATION AND DEBRIDEMENT LEFT LOWER EXTREMITY (Left)  Patient Location: PACU  Anesthesia Type:General  Level of Consciousness: sedated and patient cooperative  Airway & Oxygen Therapy: Patient connected to face mask oxygen  Post-op Assessment: Report given to RN and Post -op Vital signs reviewed and stable  Post vital signs: Reviewed and stable  Last Vitals:  Filed Vitals:   02/16/15 0330 02/16/15 0400  BP: 139/68 127/71  Pulse: 93 95  Temp:    Resp: 17 19    Complications: No apparent anesthesia complications

## 2015-02-16 NOTE — Anesthesia Postprocedure Evaluation (Signed)
Anesthesia Post Note  Patient: Heather [name]  Procedure(s) Performed: Procedure(s) (LRB): IRRIGATION AND DEBRIDEMENT LEFT LOWER EXTREMITY (Left)  Patient location during evaluation: PACU Anesthesia Type: General Level of consciousness: awake and alert Pain management: pain level controlled Vital Signs Assessment: post-procedure vital signs reviewed and stable Respiratory status: spontaneous breathing, nonlabored ventilation, respiratory function stable and patient connected to nasal cannula oxygen Cardiovascular status: blood pressure returned to baseline and stable Postop Assessment: no signs of nausea or vomiting Anesthetic complications: no    Last Vitals:  Filed Vitals:   02/16/15 0615 02/16/15 0635  BP: 131/83 116/57  Pulse: 82 78  Temp: 36.4 C 36.7 C  Resp: 17 18    Last Pain:  Filed Vitals:   02/16/15 0649  PainSc: Asleep                 Tenleigh Byer,W. EDMOND

## 2015-02-16 NOTE — Anesthesia Procedure Notes (Signed)
Procedure Name: Intubation Date/Time: 02/16/2015 4:53 AM Performed by: Valetta Fuller Pre-anesthesia Checklist: Patient identified, Emergency Drugs available, Suction available and Patient being monitored Patient Re-evaluated:Patient Re-evaluated prior to inductionOxygen Delivery Method: Circle system utilized Intubation Type: IV induction, Rapid sequence and Cricoid Pressure applied Laryngoscope Size: Miller and 2 Grade View: Grade I Tube type: Oral Tube size: 7.5 mm Number of attempts: 1 Airway Equipment and Method: Stylet Secured at: 23 cm Tube secured with: Tape Dental Injury: Teeth and Oropharynx as per pre-operative assessment

## 2015-02-16 NOTE — ED Notes (Signed)
Ortho tech at [employer]; will splint when tech arrives

## 2015-02-16 NOTE — ED Notes (Signed)
Bleeding remains controlled.
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
## 2015-02-16 NOTE — Progress Notes (Signed)
Pt CBG 256 MD made aware and does not want to treat at this time.  During operative procedure pt received 6 units sq insulin for a CBG of 325.
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
## 2015-02-16 NOTE — Progress Notes (Signed)
Patient transferred to 5N01.  Patient made XXX per patient request.  Patient and family made aware of XXX policy and how it works with password.  Report given to 5N RN.
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
## 2015-02-16 NOTE — Progress Notes (Signed)
Orthopedic Tech Progress Note Patient Details:  Heather [name] [name] [birth year] [id number] Applied fiberglass short leg splint and fiberglass stirrup splint to LLE.  Pulses, sensation, motion intact before and after splinting.  Capillary refill less than 2 seconds before and after splinting. Ortho Devices Type of Ortho Device: Stirrup splint, Short leg splint Ortho Device/Splint Location: LLE Ortho Device/Splint Interventions: Application   Darrol Poke 02/16/2015, 2:23 AM

## 2015-02-16 NOTE — ED Notes (Signed)
Pt taken to OR.

## 2015-02-16 NOTE — ED Notes (Signed)
Reginold Agent (cousin) asked this RN to place her name in the chart and would like to be given updates.    [phone].  States she will be back to visit in the morning.

## 2015-02-16 NOTE — ED Notes (Signed)
Pt taken to xray 
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
## 2015-02-17 ENCOUNTER — Encounter (HOSPITAL_COMMUNITY): Payer: Self-pay | Admitting: Orthopedic Surgery

## 2015-02-17 LAB — GLUCOSE, CAPILLARY
GLUCOSE-CAPILLARY: 201 mg/dL — AB (ref 65–99)
Glucose-Capillary: 216 mg/dL — ABNORMAL HIGH (ref 65–99)
Glucose-Capillary: 165 mg/dL — ABNORMAL HIGH (ref 65–99)
Glucose-Capillary: 127 mg/dL — ABNORMAL HIGH (ref 65–99)

## 2015-02-17 MED ORDER — INSULIN ASPART 100 UNIT/ML ~~LOC~~ SOLN
0.0000 [IU] | Freq: Every day | SUBCUTANEOUS | Status: DC
  Administered 2015-02-20: 2 [IU] via SUBCUTANEOUS

## 2015-02-17 MED ORDER — INSULIN ASPART 100 UNIT/ML ~~LOC~~ SOLN
6.0000 [IU] | Freq: Three times a day (TID) | SUBCUTANEOUS | Status: DC
  Administered 2015-02-17 – 2015-03-01 (×33): 6 [IU] via SUBCUTANEOUS

## 2015-02-17 MED ORDER — INSULIN ASPART 100 UNIT/ML ~~LOC~~ SOLN
0.0000 [IU] | Freq: Three times a day (TID) | SUBCUTANEOUS | Status: DC
  Administered 2015-02-17: 4 [IU] via SUBCUTANEOUS
  Administered 2015-02-18: 3 [IU] via SUBCUTANEOUS
  Administered 2015-02-18: 4 [IU] via SUBCUTANEOUS
  Administered 2015-02-18: 3 [IU] via SUBCUTANEOUS
  Administered 2015-02-19: 4 [IU] via SUBCUTANEOUS
  Administered 2015-02-19: 0 [IU] via SUBCUTANEOUS
  Administered 2015-02-19: 3 [IU] via SUBCUTANEOUS
  Administered 2015-02-20 (×2): 4 [IU] via SUBCUTANEOUS
  Administered 2015-02-20: 3 [IU] via SUBCUTANEOUS
  Administered 2015-02-21: 4 [IU] via SUBCUTANEOUS
  Administered 2015-02-21: 3 [IU] via SUBCUTANEOUS
  Administered 2015-02-21 – 2015-02-22 (×4): 4 [IU] via SUBCUTANEOUS
  Administered 2015-02-23: 7 [IU] via SUBCUTANEOUS
  Administered 2015-02-23 (×2): 4 [IU] via SUBCUTANEOUS
  Administered 2015-02-24: 3 [IU] via SUBCUTANEOUS
  Administered 2015-02-24 (×2): 4 [IU] via SUBCUTANEOUS
  Administered 2015-02-25: 15 [IU] via SUBCUTANEOUS
  Administered 2015-02-25: 4 [IU] via SUBCUTANEOUS
  Administered 2015-02-26: 7 [IU] via SUBCUTANEOUS
  Administered 2015-02-26: 3 [IU] via SUBCUTANEOUS
  Administered 2015-02-26: 4 [IU] via SUBCUTANEOUS
  Administered 2015-02-27: 7 [IU] via SUBCUTANEOUS
  Administered 2015-02-27: 3 [IU] via SUBCUTANEOUS
  Administered 2015-02-28: 4 [IU] via SUBCUTANEOUS
  Administered 2015-02-28: 3 [IU] via SUBCUTANEOUS
  Administered 2015-02-28: 7 [IU] via SUBCUTANEOUS
  Administered 2015-03-01: 3 [IU] via SUBCUTANEOUS
  Administered 2015-03-01: 4 [IU] via SUBCUTANEOUS

## 2015-02-17 NOTE — Progress Notes (Signed)
Inpatient Diabetes Program Recommendations  AACE/ADA: New Consensus Statement on Inpatient Glycemic Control (2015)  Target Ranges:  Prepandial:   less than 140 mg/dL      Peak postprandial:   less than 180 mg/dL (1-2 hours)      Critically ill patients:  140 - 180 mg/dL  Results for LENDY, BUSHNELL (MRN [id number]) as of 02/17/2015 10:12  Ref. Range 02/16/2015 05:45 02/16/2015 20:01 02/17/2015 06:37  Glucose-Capillary Latest Ref Range: 65-99 mg/dL 256 (H) 237 (H) 216 (H)   Review of Glycemic Control  Diabetes history: DM2 Outpatient Diabetes medications: Glipizide 10 mg BID, Metformin 1000 mg BID Current orders for Inpatient glycemic control: Novolog 0-15 units TID with meals, Novolog 0-5 units HS  Inpatient Diabetes Program Recommendations: Insulin - Basal: Please consider ordering low dose basal insulin. Recommend starting with Levemir 9 units QHS (based on 93 kg x 0.1 units).  Thanks, Barnie Alderman, RN, MSN, CDE Diabetes Coordinator Inpatient Diabetes Program [phone] (Team Pager from [place] to [place]) [phone] ([facility] office) [phone] [facility] office) [phone] [facility] office)

## 2015-02-17 NOTE — Progress Notes (Signed)
    Subjective: 1 Day Post-Op Procedure(s) (LRB): IRRIGATION AND DEBRIDEMENT LEFT LOWER EXTREMITY (Left) Patient reports pain as 3 on 0-10 scale.   Denies CP or SOB.  Voiding without difficulty. Positive flatus. Objective: Vital signs in last 24 hours: Temp:  [97.9 F (36.6 C)-98.6 F (37 C)] 98.6 F (37 C) (12/23 0640) Pulse Rate:  [85-97] 97 (12/23 0640) Resp:  [16-18] 18 (12/23 0640) BP: (139-162)/(76-81) 156/76 mmHg (12/23 0640) SpO2:  [95 %-100 %] 96 % (12/23 0640)  Intake/Output from previous day: 12/22 0701 - 12/23 0700 In: 825 [P.O.:300; I.V.:425; IV Piggyback:100] Out: 701 [Urine:701] Intake/Output this shift:    Labs:  Recent Labs  02/15/15 2356 02/16/15 0736  HGB 12.9 9.9*    Recent Labs  02/15/15 2356 02/16/15 0736  WBC 10.1 10.5  RBC 4.66 3.64*  HCT 40.1 31.4*  PLT 292 255    Recent Labs  02/15/15 2356 02/16/15 0736  NA 138  --   K 3.2*  --   CL 102  --   CO2 22  --   BUN 10  --   CREATININE 0.82 0.66  GLUCOSE 322*  --   CALCIUM 9.6  --     Recent Labs  02/15/15 2356  INR 0.99    Physical Exam: Neurologically intact Intact pulses distally Compartment soft EHL/TA/GA intact  Assessment/Plan: 1 Day Post-Op Procedure(s) (LRB): IRRIGATION AND DEBRIDEMENT LEFT LOWER EXTREMITY (Left) Advance diet Up with therapy  Plan on ORIF Monday with Dr Erlinda Hong Continue care.  Melina Schools D for Dr. Melina Schools [facility] [phone] 02/17/2015, 8:31 AM
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
## 2015-02-18 LAB — GLUCOSE, CAPILLARY
GLUCOSE-CAPILLARY: 154 mg/dL — AB (ref 65–99)
GLUCOSE-CAPILLARY: 95 mg/dL (ref 65–99)
Glucose-Capillary: 142 mg/dL — ABNORMAL HIGH (ref 65–99)
Glucose-Capillary: 145 mg/dL — ABNORMAL HIGH (ref 65–99)

## 2015-02-18 MED ORDER — WHITE PETROLATUM GEL
Status: AC
  Administered 2015-02-18: 1
  Filled 2015-02-18: qty 1

## 2015-02-18 NOTE — Progress Notes (Signed)
    Subjective: 2 Days Post-Op Procedure(s) (LRB): IRRIGATION AND DEBRIDEMENT LEFT LOWER EXTREMITY (Left) Patient reports pain as 3 on 0-10 scale.   Denies CP or SOB.  Voiding without difficulty. Positive flatus. Objective: Vital signs in last 24 hours: Temp:  [98.1 F (36.7 C)-98.4 F (36.9 C)] 98.4 F (36.9 C) (12/24 0653) Pulse Rate:  [88-99] 96 (12/24 0653) Resp:  [16-18] 18 (12/24 0653) BP: (146-157)/(71-80) 150/80 mmHg (12/24 0653) SpO2:  [95 %-97 %] 97 % (12/24 0653)  Intake/Output from previous day: 12/23 0701 - 12/24 0700 In: 480 [P.O.:480] Out: -  Intake/Output this shift:    Labs:  Recent Labs  02/15/15 2356 02/16/15 0736  HGB 12.9 9.9*    Recent Labs  02/15/15 2356 02/16/15 0736  WBC 10.1 10.5  RBC 4.66 3.64*  HCT 40.1 31.4*  PLT 292 255    Recent Labs  02/15/15 2356 02/16/15 0736  NA 138  --   K 3.2*  --   CL 102  --   CO2 22  --   BUN 10  --   CREATININE 0.82 0.66  GLUCOSE 322*  --   CALCIUM 9.6  --     Recent Labs  02/15/15 2356  INR 0.99    Physical Exam: Neurologically intact Intact pulses distally Compartment soft EHL/TA/GA intact  Assessment/Plan: 2 Days Post-Op Procedure(s) (LRB): IRRIGATION AND DEBRIDEMENT LEFT LOWER EXTREMITY (Left) Advance diet Up with therapy  Plan on ORIF Monday with Dr Erlinda Hong Continue care.  Daly Whipkey, [place] [phone] 02/18/2015, 7:30 AM

## 2015-02-18 NOTE — Progress Notes (Signed)
Patient stable and comfortable Dressings changed Compartments soft Plan for ORIF as soon as possible  N. Eduard Roux, MD [place] 8:04 AM

## 2015-02-19 LAB — GLUCOSE, CAPILLARY
GLUCOSE-CAPILLARY: 116 mg/dL — AB (ref 65–99)
GLUCOSE-CAPILLARY: 163 mg/dL — AB (ref 65–99)
GLUCOSE-CAPILLARY: 124 mg/dL — AB (ref 65–99)
Glucose-Capillary: 159 mg/dL — ABNORMAL HIGH (ref 65–99)

## 2015-02-19 NOTE — Progress Notes (Signed)
Patient is stable Dressing c/d/i Surgery is planned for Wednesday  N. Eduard Roux, MD [place] 7:14 AM

## 2015-02-20 LAB — GLUCOSE, CAPILLARY
GLUCOSE-CAPILLARY: 141 mg/dL — AB (ref 65–99)
Glucose-Capillary: 186 mg/dL — ABNORMAL HIGH (ref 65–99)
Glucose-Capillary: 165 mg/dL — ABNORMAL HIGH (ref 65–99)
Glucose-Capillary: 207 mg/dL — ABNORMAL HIGH (ref 65–99)

## 2015-02-20 NOTE — Progress Notes (Signed)
    Subjective: 4 Days Post-Op Procedure(s) (LRB): IRRIGATION AND DEBRIDEMENT LEFT LOWER EXTREMITY (Left) Patient reports pain as 2 on 0-10 scale.   Denies CP or SOB.  Voiding without difficulty. Positive flatus. Objective: Vital signs in last 24 hours: Temp:  [98 F (36.7 C)-98.2 F (36.8 C)] 98.2 F (36.8 C) (12/26 0433) Pulse Rate:  [77-92] 77 (12/26 0433) Resp:  [18] 18 (12/26 0433) BP: (142-165)/(75-82) 149/76 mmHg (12/26 0433) SpO2:  [97 %-100 %] 97 % (12/26 0433)  Intake/Output from previous day: 12/25 0701 - 12/26 0700 In: 480 [P.O.:480] Out: -  Intake/Output this shift:    Labs: No results for input(s): HGB in the last 72 hours. No results for input(s): WBC, RBC, HCT, PLT in the last 72 hours. No results for input(s): NA, K, CL, CO2, BUN, CREATININE, GLUCOSE, CALCIUM in the last 72 hours. No results for input(s): LABPT, INR in the last 72 hours.  Physical Exam: Neurologically intact Intact pulses distally  Assessment/Plan: 4 Days Post-Op Procedure(s) (LRB): IRRIGATION AND DEBRIDEMENT LEFT LOWER EXTREMITY (Left) Plan on ORIF with Dr Erlinda Hong this week - possibly Wednesday per patient. Continue lovenox for DVT prevention    Almin Livingstone D for Dr. Melina Schools [facility] [phone] 02/20/2015, 9:01 AM

## 2015-02-21 LAB — CBC
HCT: 31.8 % — ABNORMAL LOW (ref 36.0–46.0)
Hemoglobin: 10.2 g/dL — ABNORMAL LOW (ref 12.0–15.0)
MCH: 27.6 pg (ref 26.0–34.0)
MCHC: 32.1 g/dL (ref 30.0–36.0)
MCV: 85.9 fL (ref 78.0–100.0)
Platelets: 309 10*3/uL (ref 150–400)
RBC: 3.7 MIL/uL — ABNORMAL LOW (ref 3.87–5.11)
RDW: 14.2 % (ref 11.5–15.5)
WBC: 8.3 10*3/uL (ref 4.0–10.5)

## 2015-02-21 LAB — GLUCOSE, CAPILLARY
Glucose-Capillary: 175 mg/dL — ABNORMAL HIGH (ref 65–99)
Glucose-Capillary: 176 mg/dL — ABNORMAL HIGH (ref 65–99)
Glucose-Capillary: 139 mg/dL — ABNORMAL HIGH (ref 65–99)
Glucose-Capillary: 181 mg/dL — ABNORMAL HIGH (ref 65–99)

## 2015-02-21 LAB — BASIC METABOLIC PANEL
Anion gap: 8 (ref 5–15)
BUN: 7 mg/dL (ref 6–20)
CO2: 28 mmol/L (ref 22–32)
Calcium: 9 mg/dL (ref 8.9–10.3)
Chloride: 101 mmol/L (ref 101–111)
Creatinine, Ser: 0.7 mg/dL (ref 0.44–1.00)
GFR calc Af Amer: >60 mL/min (ref >60)
GFR calc non Af Amer: >60 mL/min (ref >60)
Glucose, Bld: 231 mg/dL — ABNORMAL HIGH (ref 65–99)
Potassium: 4.2 mmol/L (ref 3.5–5.1)
Sodium: 137 mmol/L (ref 135–145)

## 2015-02-21 MED ORDER — CEFAZOLIN SODIUM-DEXTROSE 2-3 GM-% IV SOLR
2.0000 g | INTRAVENOUS | Status: AC
  Filled 2015-02-21 (×2): qty 50

## 2015-02-21 NOTE — Progress Notes (Signed)
Plan for ORIF tomorrow NPO after midnight Consent obtained  N. Eduard Roux, MD [place] 9:14 AM

## 2015-02-21 NOTE — Progress Notes (Signed)
    Subjective: 5 Days Post-Op Procedure(s) (LRB): IRRIGATION AND DEBRIDEMENT LEFT LOWER EXTREMITY (Left) Patient reports pain as 2 on 0-10 scale.   Denies CP or SOB.  Voiding without difficulty. Positive flatus. Objective: Vital signs in last 24 hours: Temp:  [98.2 F (36.8 C)-98.6 F (37 C)] 98.6 F (37 C) (12/27 0452) Pulse Rate:  [85-92] 92 (12/27 0452) Resp:  [17-18] 18 (12/27 0452) BP: (141-157)/(73-79) 157/73 mmHg (12/27 0452) SpO2:  [97 %-99 %] 99 % (12/27 0452)  Intake/Output from previous day: 12/26 0701 - 12/27 0700 In: 4780.2 [P.O.:720; I.V.:4060.2] Out: 1 [Urine:1] Intake/Output this shift:    Labs: No results for input(s): HGB in the last 72 hours. No results for input(s): WBC, RBC, HCT, PLT in the last 72 hours. No results for input(s): NA, K, CL, CO2, BUN, CREATININE, GLUCOSE, CALCIUM in the last 72 hours. No results for input(s): LABPT, INR in the last 72 hours.  Physical Exam: Neurologically intact ABD soft Compartment soft  Assessment/Plan: 5 Days Post-Op Procedure(s) (LRB): IRRIGATION AND DEBRIDEMENT LEFT LOWER EXTREMITY (Left) Plan on ORIF tomorrow Will transfer care to Dr Erlinda Hong No new recommendations  Melina Schools D for Dr. Melina Schools [facility] [phone] 02/21/2015, 8:05 AM

## 2015-02-22 ENCOUNTER — Encounter (HOSPITAL_COMMUNITY)
Admission: EM | Disposition: A | Discharge status: Home Health | Payer: Self-pay | Source: Home / Self Care | Attending: Orthopaedic Surgery

## 2015-02-22 ENCOUNTER — Inpatient Hospital Stay (HOSPITAL_COMMUNITY): Payer: BLUE CROSS/BLUE SHIELD | Admitting: Anesthesiology

## 2015-02-22 ENCOUNTER — Inpatient Hospital Stay (HOSPITAL_COMMUNITY): Payer: BLUE CROSS/BLUE SHIELD

## 2015-02-22 HISTORY — PX: I & D EXTREMITY: SHX5045

## 2015-02-22 HISTORY — PX: ORIF TIBIA PLATEAU: SHX2132

## 2015-02-22 LAB — GLUCOSE, CAPILLARY
GLUCOSE-CAPILLARY: 193 mg/dL — AB (ref 65–99)
GLUCOSE-CAPILLARY: 182 mg/dL — AB (ref 65–99)
Glucose-Capillary: 192 mg/dL — ABNORMAL HIGH (ref 65–99)
Glucose-Capillary: 201 mg/dL — ABNORMAL HIGH (ref 65–99)
Glucose-Capillary: 234 mg/dL — ABNORMAL HIGH (ref 65–99)
Glucose-Capillary: 179 mg/dL — ABNORMAL HIGH (ref 65–99)
Glucose-Capillary: 168 mg/dL — ABNORMAL HIGH (ref 65–99)

## 2015-02-22 SURGERY — OPEN REDUCTION INTERNAL FIXATION (ORIF) TIBIAL PLATEAU
Anesthesia: General | Site: Leg Lower | Laterality: Left

## 2015-02-22 MED ORDER — SENNOSIDES-DOCUSATE SODIUM 8.6-50 MG PO TABS: 1.0000 | ORAL_TABLET | Freq: Every evening | ORAL | Status: DC | PRN

## 2015-02-22 MED ORDER — MIDAZOLAM HCL 5 MG/5ML IJ SOLN
INTRAMUSCULAR | Status: DC | PRN
  Administered 2015-02-22: 2 mg via INTRAVENOUS

## 2015-02-22 MED ORDER — FENTANYL CITRATE (PF) 250 MCG/5ML IJ SOLN
INTRAMUSCULAR | Status: AC
  Filled 2015-02-22: qty 5

## 2015-02-22 MED ORDER — MORPHINE SULFATE (PF) 2 MG/ML IV SOLN
1.0000 mg | INTRAVENOUS | Status: DC | PRN
  Administered 2015-02-22 (×4): 1 mg via INTRAVENOUS
  Filled 2015-02-22 (×4): qty 1

## 2015-02-22 MED ORDER — SUCCINYLCHOLINE CHLORIDE 20 MG/ML IJ SOLN
INTRAMUSCULAR | Status: AC
  Filled 2015-02-22: qty 1

## 2015-02-22 MED ORDER — METOCLOPRAMIDE HCL 5 MG PO TABS: 5.0000 mg | ORAL_TABLET | Freq: Three times a day (TID) | ORAL | Status: DC | PRN

## 2015-02-22 MED ORDER — LACTATED RINGERS IV SOLN
INTRAVENOUS | Status: DC | PRN
  Administered 2015-02-22: 09:00:00 via INTRAVENOUS

## 2015-02-22 MED ORDER — BUPIVACAINE HCL (PF) 0.25 % IJ SOLN
INTRAMUSCULAR | Status: AC
  Filled 2015-02-22: qty 30

## 2015-02-22 MED ORDER — ACETAMINOPHEN 650 MG RE SUPP: 650.0000 mg | Freq: Four times a day (QID) | RECTAL | Status: DC | PRN

## 2015-02-22 MED ORDER — ASPIRIN EC 325 MG PO TBEC
325.0000 mg | DELAYED_RELEASE_TABLET | Freq: Two times a day (BID) | ORAL | Status: DC
  Administered 2015-02-22 – 2015-03-01 (×14): 325 mg via ORAL
  Filled 2015-02-22 (×14): qty 1

## 2015-02-22 MED ORDER — OXYCODONE HCL 5 MG PO TABS: 5.0000 mg | ORAL_TABLET | ORAL | Status: DC | PRN

## 2015-02-22 MED ORDER — HYDROMORPHONE HCL 1 MG/ML IJ SOLN
0.2500 mg | INTRAMUSCULAR | Status: DC | PRN
  Administered 2015-02-22 (×4): 0.5 mg via INTRAVENOUS

## 2015-02-22 MED ORDER — HYDROCHLOROTHIAZIDE 25 MG PO TABS
25.0000 mg | ORAL_TABLET | Freq: Every day | ORAL | Status: DC
  Administered 2015-02-22 – 2015-03-01 (×8): 25 mg via ORAL
  Filled 2015-02-22 (×7): qty 1

## 2015-02-22 MED ORDER — OXYCODONE HCL 5 MG/5ML PO SOLN: 5.0000 mg | Freq: Once | ORAL | Status: AC | PRN

## 2015-02-22 MED ORDER — MIDAZOLAM HCL 2 MG/2ML IJ SOLN
INTRAMUSCULAR | Status: AC
  Filled 2015-02-22: qty 2

## 2015-02-22 MED ORDER — PROPOFOL 10 MG/ML IV BOLUS
INTRAVENOUS | Status: AC
  Filled 2015-02-22: qty 20

## 2015-02-22 MED ORDER — OXYCODONE HCL 5 MG PO TABS
5.0000 mg | ORAL_TABLET | Freq: Once | ORAL | Status: AC | PRN
  Administered 2015-02-22: 5 mg via ORAL

## 2015-02-22 MED ORDER — SUGAMMADEX SODIUM 200 MG/2ML IV SOLN
INTRAVENOUS | Status: AC
  Filled 2015-02-22: qty 2

## 2015-02-22 MED ORDER — ROCURONIUM BROMIDE 100 MG/10ML IV SOLN
INTRAVENOUS | Status: DC | PRN
  Administered 2015-02-22: 40 mg via INTRAVENOUS

## 2015-02-22 MED ORDER — PROMETHAZINE HCL 25 MG/ML IJ SOLN: 6.2500 mg | INTRAMUSCULAR | Status: DC | PRN

## 2015-02-22 MED ORDER — ONDANSETRON HCL 4 MG PO TABS: 4.0000 mg | ORAL_TABLET | Freq: Four times a day (QID) | ORAL | Status: DC | PRN

## 2015-02-22 MED ORDER — LACTATED RINGERS IV SOLN: INTRAVENOUS | Status: DC

## 2015-02-22 MED ORDER — LISINOPRIL-HYDROCHLOROTHIAZIDE 20-25 MG PO TABS: 1.0000 | ORAL_TABLET | Freq: Every day | ORAL | Status: DC

## 2015-02-22 MED ORDER — METHOCARBAMOL 500 MG PO TABS
500.0000 mg | ORAL_TABLET | Freq: Four times a day (QID) | ORAL | Status: DC | PRN
  Administered 2015-02-22 – 2015-02-24 (×5): 500 mg via ORAL
  Filled 2015-02-22 (×5): qty 1

## 2015-02-22 MED ORDER — FENTANYL CITRATE (PF) 100 MCG/2ML IJ SOLN
INTRAMUSCULAR | Status: DC | PRN
  Administered 2015-02-22 (×4): 50 ug via INTRAVENOUS
  Administered 2015-02-22 (×2): 100 ug via INTRAVENOUS

## 2015-02-22 MED ORDER — GLIPIZIDE 5 MG PO TABS
10.0000 mg | ORAL_TABLET | Freq: Two times a day (BID) | ORAL | Status: DC
  Administered 2015-02-22 – 2015-03-01 (×15): 10 mg via ORAL
  Filled 2015-02-22 (×15): qty 2

## 2015-02-22 MED ORDER — DEXTROSE 5 % IV SOLN
500.0000 mg | Freq: Four times a day (QID) | INTRAVENOUS | Status: DC | PRN
  Filled 2015-02-22: qty 5

## 2015-02-22 MED ORDER — EPHEDRINE SULFATE 50 MG/ML IJ SOLN
INTRAMUSCULAR | Status: AC
  Filled 2015-02-22: qty 1

## 2015-02-22 MED ORDER — AMLODIPINE BESYLATE 5 MG PO TABS
5.0000 mg | ORAL_TABLET | Freq: Every day | ORAL | Status: DC
  Administered 2015-02-22 – 2015-03-01 (×8): 5 mg via ORAL
  Filled 2015-02-22 (×8): qty 1

## 2015-02-22 MED ORDER — LIDOCAINE HCL (CARDIAC) 20 MG/ML IV SOLN
INTRAVENOUS | Status: DC | PRN
  Administered 2015-02-22: 100 mg via INTRAVENOUS

## 2015-02-22 MED ORDER — DIPHENHYDRAMINE HCL 12.5 MG/5ML PO ELIX: 25.0000 mg | ORAL_SOLUTION | ORAL | Status: DC | PRN

## 2015-02-22 MED ORDER — OXYCODONE HCL 5 MG PO TABS
5.0000 mg | ORAL_TABLET | ORAL | Status: DC | PRN
  Administered 2015-02-22 – 2015-03-01 (×12): 10 mg via ORAL
  Filled 2015-02-22 (×17): qty 2

## 2015-02-22 MED ORDER — SODIUM CHLORIDE 0.9 % IJ SOLN
INTRAMUSCULAR | Status: AC
  Filled 2015-02-22: qty 10

## 2015-02-22 MED ORDER — METHOCARBAMOL 750 MG PO TABS: 750.0000 mg | ORAL_TABLET | Freq: Two times a day (BID) | ORAL | Status: DC | PRN

## 2015-02-22 MED ORDER — ASPIRIN EC 325 MG PO TBEC: 325.0000 mg | DELAYED_RELEASE_TABLET | Freq: Two times a day (BID) | ORAL | Status: DC

## 2015-02-22 MED ORDER — ONDANSETRON HCL 4 MG/2ML IJ SOLN: 4.0000 mg | Freq: Four times a day (QID) | INTRAMUSCULAR | Status: DC | PRN

## 2015-02-22 MED ORDER — INSULIN ASPART 100 UNIT/ML ~~LOC~~ SOLN
SUBCUTANEOUS | Status: AC
  Filled 2015-02-22: qty 6

## 2015-02-22 MED ORDER — SUGAMMADEX SODIUM 200 MG/2ML IV SOLN
INTRAVENOUS | Status: DC | PRN
  Administered 2015-02-22: 200 mg via INTRAVENOUS

## 2015-02-22 MED ORDER — CEFAZOLIN SODIUM-DEXTROSE 2-3 GM-% IV SOLR
2.0000 g | Freq: Four times a day (QID) | INTRAVENOUS | Status: AC
  Administered 2015-02-22 – 2015-02-23 (×3): 2 g via INTRAVENOUS
  Filled 2015-02-22 (×3): qty 50

## 2015-02-22 MED ORDER — INSULIN ASPART 100 UNIT/ML ~~LOC~~ SOLN
6.0000 [IU] | Freq: Once | SUBCUTANEOUS | Status: AC
  Administered 2015-02-22: 6 [IU] via SUBCUTANEOUS

## 2015-02-22 MED ORDER — ACETAMINOPHEN 325 MG PO TABS: 650.0000 mg | ORAL_TABLET | Freq: Four times a day (QID) | ORAL | Status: DC | PRN

## 2015-02-22 MED ORDER — ONDANSETRON HCL 4 MG PO TABS: 4.0000 mg | ORAL_TABLET | Freq: Three times a day (TID) | ORAL | Status: DC | PRN

## 2015-02-22 MED ORDER — SODIUM CHLORIDE 0.9 % IV SOLN
INTRAVENOUS | Status: DC
  Administered 2015-02-22: 20:00:00 via INTRAVENOUS

## 2015-02-22 MED ORDER — OXYCODONE HCL 5 MG PO TABS
ORAL_TABLET | ORAL | Status: AC
  Filled 2015-02-22: qty 1

## 2015-02-22 MED ORDER — OXYCODONE HCL ER 10 MG PO T12A: 10.0000 mg | EXTENDED_RELEASE_TABLET | Freq: Two times a day (BID) | ORAL | Status: DC

## 2015-02-22 MED ORDER — SODIUM CHLORIDE 0.9 % IR SOLN
Status: DC | PRN
  Administered 2015-02-22: 1000 mL
  Administered 2015-02-22: 3000 mL

## 2015-02-22 MED ORDER — ONDANSETRON HCL 4 MG/2ML IJ SOLN
INTRAMUSCULAR | Status: AC
  Filled 2015-02-22: qty 2

## 2015-02-22 MED ORDER — PROPOFOL 10 MG/ML IV BOLUS
INTRAVENOUS | Status: DC | PRN
  Administered 2015-02-22: 150 mg via INTRAVENOUS

## 2015-02-22 MED ORDER — ROCURONIUM BROMIDE 50 MG/5ML IV SOLN
INTRAVENOUS | Status: AC
  Filled 2015-02-22: qty 1

## 2015-02-22 MED ORDER — METOCLOPRAMIDE HCL 5 MG/ML IJ SOLN: 5.0000 mg | Freq: Three times a day (TID) | INTRAMUSCULAR | Status: DC | PRN

## 2015-02-22 MED ORDER — LISINOPRIL 20 MG PO TABS
20.0000 mg | ORAL_TABLET | Freq: Every day | ORAL | Status: DC
  Administered 2015-02-22 – 2015-03-01 (×8): 20 mg via ORAL
  Filled 2015-02-22 (×8): qty 1

## 2015-02-22 MED ORDER — GABAPENTIN 300 MG PO CAPS
300.0000 mg | ORAL_CAPSULE | Freq: Three times a day (TID) | ORAL | Status: DC
  Administered 2015-02-22 – 2015-03-01 (×22): 300 mg via ORAL
  Filled 2015-02-22 (×23): qty 1

## 2015-02-22 MED ORDER — ONDANSETRON HCL 4 MG/2ML IJ SOLN
INTRAMUSCULAR | Status: DC | PRN
  Administered 2015-02-22: 4 mg via INTRAVENOUS

## 2015-02-22 MED ORDER — HYDROMORPHONE HCL 1 MG/ML IJ SOLN
INTRAMUSCULAR | Status: AC
  Filled 2015-02-22: qty 1

## 2015-02-22 SURGICAL SUPPLY — 103 items
BANDAGE ELASTIC 3 VELCRO ST LF (GAUZE/BANDAGES/DRESSINGS) IMPLANT
BANDAGE ELASTIC 6 VELCRO ST LF (GAUZE/BANDAGES/DRESSINGS) ×3 IMPLANT
BANDAGE ESMARK 6X9 LF (GAUZE/BANDAGES/DRESSINGS) ×1 IMPLANT
BIT DRILL 2.7 QC 7.9IN LONG (BIT) ×1 IMPLANT
BIT DRILL QC 2.7 6.3IN  SHORT (BIT) ×2
BIT DRILL QC 2.7 6.3IN SHORT (BIT) IMPLANT
BLADE SURG 10 STRL SS (BLADE) ×1 IMPLANT
BLADE SURG ROTATE 9660 (MISCELLANEOUS) IMPLANT
BNDG CMPR 9X6 STRL LF SNTH (GAUZE/BANDAGES/DRESSINGS) ×1
BNDG COHESIVE 1X5 TAN STRL LF (GAUZE/BANDAGES/DRESSINGS) IMPLANT
BNDG COHESIVE 4X5 TAN STRL (GAUZE/BANDAGES/DRESSINGS) ×1 IMPLANT
BNDG COHESIVE 6X5 TAN STRL LF (GAUZE/BANDAGES/DRESSINGS) ×3 IMPLANT
BNDG CONFORM 3 STRL LF (GAUZE/BANDAGES/DRESSINGS) IMPLANT
BNDG ESMARK 6X9 LF (GAUZE/BANDAGES/DRESSINGS) ×2
BNDG GAUZE STRTCH 6 (GAUZE/BANDAGES/DRESSINGS) ×6 IMPLANT
CORDS BIPOLAR (ELECTRODE) IMPLANT
COVER SURGICAL LIGHT HANDLE (MISCELLANEOUS) ×2 IMPLANT
CUFF TOURNIQUET SINGLE 24IN (TOURNIQUET CUFF) IMPLANT
CUFF TOURNIQUET SINGLE 34IN LL (TOURNIQUET CUFF) ×3 IMPLANT
CUFF TOURNIQUET SINGLE 44IN (TOURNIQUET CUFF) IMPLANT
DRAPE C-ARM 42X72 X-RAY (DRAPES) ×2 IMPLANT
DRAPE C-ARMOR (DRAPES) ×1 IMPLANT
DRAPE EXTREMITY BILATERAL (DRAPE) IMPLANT
DRAPE IMP U-DRAPE 54X76 (DRAPES) ×4 IMPLANT
DRAPE INCISE IOBAN 66X45 STRL (DRAPES) ×8 IMPLANT
DRAPE ORTHO SPLIT 77X108 STRL (DRAPES) ×4
DRAPE PROXIMA HALF (DRAPES) ×2 IMPLANT
DRAPE SURG 17X23 STRL (DRAPES) IMPLANT
DRAPE SURG ORHT 6 SPLT 77X108 (DRAPES) ×2 IMPLANT
DRAPE U-SHAPE 47X51 STRL (DRAPES) ×2 IMPLANT
DRSG PAD ABDOMINAL 8X10 ST (GAUZE/BANDAGES/DRESSINGS) ×1 IMPLANT
DURAPREP 26ML APPLICATOR (WOUND CARE) ×2 IMPLANT
ELECT CAUTERY BLADE 6.4 (BLADE) ×2 IMPLANT
ELECT REM PT RETURN 9FT ADLT (ELECTROSURGICAL) ×2
ELECTRODE REM PT RTRN 9FT ADLT (ELECTROSURGICAL) ×1 IMPLANT
FACESHIELD STD STERILE (MASK) ×1 IMPLANT
FACESHIELD WRAPAROUND (MASK) IMPLANT
FACESHIELD WRAPAROUND OR TEAM (MASK) IMPLANT
GAUZE SPONGE 4X4 12PLY STRL (GAUZE/BANDAGES/DRESSINGS) ×3 IMPLANT
GAUZE XEROFORM 1X8 LF (GAUZE/BANDAGES/DRESSINGS) ×1 IMPLANT
GAUZE XEROFORM 5X9 LF (GAUZE/BANDAGES/DRESSINGS) ×2 IMPLANT
GLOVE NEODERM STRL 7.5 LF PF (GLOVE) ×2 IMPLANT
GLOVE SURG NEODERM 7.5  LF PF (GLOVE) ×2
GLOVE SURG SYN 7.5  E (GLOVE) ×1
GLOVE SURG SYN 7.5 E (GLOVE) ×1 IMPLANT
GLOVE SURG SYN 7.5 PF PI (GLOVE) ×1 IMPLANT
GOWN STRL REIN XL XLG (GOWN DISPOSABLE) ×4 IMPLANT
HANDPIECE INTERPULSE COAX TIP (DISPOSABLE)
K-WIRE 2.0 (WIRE) ×2
K-WIRE TROCAR PT 2.0 150MM (WIRE) ×2
KIT BASIN OR (CUSTOM PROCEDURE TRAY) ×2 IMPLANT
KIT ROOM TURNOVER OR (KITS) ×2 IMPLANT
KWIRE TROCAR PT 2.0 150 (WIRE) IMPLANT
KWIRE TROCAR PT 2.0 150MM (WIRE) ×2 IMPLANT
MANIFOLD NEPTUNE II (INSTRUMENTS) ×2 IMPLANT
NDL SUT 6 .5 CRC .975X.05 MAYO (NEEDLE) IMPLANT
NEEDLE MAYO TAPER (NEEDLE)
NS IRRIG 1000ML POUR BTL (IV SOLUTION) ×4 IMPLANT
PACK GENERAL/GYN (CUSTOM PROCEDURE TRAY) ×1 IMPLANT
PACK ORTHO EXTREMITY (CUSTOM PROCEDURE TRAY) ×2 IMPLANT
PAD ABD 8X10 STRL (GAUZE/BANDAGES/DRESSINGS) ×2 IMPLANT
PAD ARMBOARD 7.5X6 YLW CONV (MISCELLANEOUS) ×4 IMPLANT
PADDING CAST ABS 4INX4YD NS (CAST SUPPLIES) ×2
PADDING CAST ABS 6INX4YD NS (CAST SUPPLIES) ×1
PADDING CAST ABS COTTON 4X4 ST (CAST SUPPLIES) ×2 IMPLANT
PADDING CAST ABS COTTON 6X4 NS (CAST SUPPLIES) IMPLANT
PADDING CAST COTTON 6X4 STRL (CAST SUPPLIES) ×2 IMPLANT
PLATE TIBIA L PROX 6 HOLE (Plate) ×1 IMPLANT
PUTTY DBX 1CC (Putty) ×2 IMPLANT
PUTTY DBX 1CC DEPUY (Putty) IMPLANT
SCREW LOCK 3.5X30 (Screw) ×2 IMPLANT
SCREW LOCK 3.5X32 (Screw) ×2 IMPLANT
SCREW LOCK 3.5X34 (Screw) ×2 IMPLANT
SCREW LOCK 3.5X65 (Screw) ×1 IMPLANT
SCREW LOCK 3.5X70MM (Screw) ×1 IMPLANT
SCREW LOCK T20 30X3.5XST CORT (Screw) IMPLANT
SCREW LOCK T20 34X3.5XST CORT (Screw) IMPLANT
SCREW PARTIAL THREAD 4.0X70 (Screw) ×1 IMPLANT
SET HNDPC FAN SPRY TIP SCT (DISPOSABLE) IMPLANT
SPONGE GAUZE 4X4 12PLY STER LF (GAUZE/BANDAGES/DRESSINGS) ×2 IMPLANT
SPONGE LAP 18X18 X RAY DECT (DISPOSABLE) ×2 IMPLANT
STAPLER VISISTAT 35W (STAPLE) IMPLANT
STOCKINETTE IMPERVIOUS 9X36 MD (GAUZE/BANDAGES/DRESSINGS) ×2 IMPLANT
SUT ETHILON 2 0 FS 18 (SUTURE) ×3 IMPLANT
SUT ETHILON 2 0 PSLX (SUTURE) ×2 IMPLANT
SUT ETHILON 3 0 PS 1 (SUTURE) ×5 IMPLANT
SUT PDS AB 0 CT 36 (SUTURE) IMPLANT
SUT VIC AB 0 CT1 27 (SUTURE) ×2
SUT VIC AB 0 CT1 27XBRD ANBCTR (SUTURE) IMPLANT
SUT VIC AB 2-0 CT1 27 (SUTURE) ×4
SUT VIC AB 2-0 CT1 36 (SUTURE) ×1 IMPLANT
SUT VIC AB 2-0 CT1 TAPERPNT 27 (SUTURE) ×1 IMPLANT
SUT VIC AB 2-0 FS1 27 (SUTURE) ×2 IMPLANT
SYR CONTROL 10ML LL (SYRINGE) IMPLANT
TOWEL OR 17X24 6PK STRL BLUE (TOWEL DISPOSABLE) ×2 IMPLANT
TOWEL OR 17X26 10 PK STRL BLUE (TOWEL DISPOSABLE) ×3 IMPLANT
TUBE ANAEROBIC SPECIMEN COL (MISCELLANEOUS) IMPLANT
TUBE CONNECTING 12X1/4 (SUCTIONS) ×2 IMPLANT
TUBE FEEDING 5FR 15 INCH (TUBING) IMPLANT
TUBING CYSTO DISP (UROLOGICAL SUPPLIES) ×1 IMPLANT
UNDERPAD 30X30 INCONTINENT (UNDERPADS AND DIAPERS) ×3 IMPLANT
WATER STERILE IRR 1000ML POUR (IV SOLUTION) ×2 IMPLANT
YANKAUER SUCT BULB TIP NO VENT (SUCTIONS) ×2 IMPLANT

## 2015-02-22 NOTE — Progress Notes (Signed)
Orthopedic Tech Progress Note Patient Details:  Heather [name] [facility] 09-May-1950 [id number]  Ortho Devices Type of Ortho Device: Stirrup splint, Short leg splint Ortho Device/Splint Location: applied ohf to bed Ortho Device/Splint Interventions: Ordered, Application   Karolee Stamps 02/22/2015, 6:21 PM

## 2015-02-22 NOTE — Anesthesia Procedure Notes (Signed)
Procedure Name: Intubation Date/Time: 02/22/2015 9:32 AM Performed by: Trixie Deis A Pre-anesthesia Checklist: Patient identified, Emergency Drugs available, Suction available, Patient being monitored and Timeout performed Patient Re-evaluated:Patient Re-evaluated prior to inductionOxygen Delivery Method: Circle system utilized Preoxygenation: Pre-oxygenation with 100% oxygen Intubation Type: IV induction Ventilation: Mask ventilation without difficulty Laryngoscope Size: Mac and 3 Grade View: Grade I Tube type: Oral Tube size: 7.0 mm Number of attempts: 1 Airway Equipment and Method: Stylet Placement Confirmation: ETT inserted through vocal cords under direct vision,  positive ETCO2 and breath sounds checked- equal and bilateral Secured at: 21 cm Tube secured with: Tape Dental Injury: Teeth and Oropharynx as per pre-operative assessment

## 2015-02-22 NOTE — Progress Notes (Signed)
Orthopedic Tech Progress Note Patient Details:  Heather [name] [facility] Jul 12, 1950 [id number] Brace order completed by bio-tech vendor. Patient ID: Heather [name], female   DOB: [birth year], 64 y.o.   MRN: [id number]   Braulio Bosch 02/22/2015, 3:46 PM

## 2015-02-22 NOTE — Anesthesia Preprocedure Evaluation (Signed)
Anesthesia Evaluation  Patient identified by MRN, date of birth, ID band Patient awake    Reviewed: Allergy & Precautions, H&P , NPO status , Patient's Chart, lab work & pertinent test results  Airway Mallampati: II  TM Distance: >3 FB Neck ROM: Full    Dental no notable dental hx. (+) Poor Dentition, Dental Advisory Given   Pulmonary neg pulmonary ROS,    Pulmonary exam normal breath sounds clear to auscultation       Cardiovascular hypertension, Pt. on medications  Rhythm:Regular Rate:Normal     Neuro/Psych negative neurological ROS  negative psych ROS   GI/Hepatic negative GI ROS, Neg liver ROS,   Endo/Other  diabetes, Type 2, Oral Hypoglycemic Agents  Renal/GU negative Renal ROS  negative genitourinary   Musculoskeletal   Abdominal   Peds  Hematology negative hematology ROS (+)   Anesthesia Other Findings   Reproductive/Obstetrics negative OB ROS                             Anesthesia Physical  Anesthesia Plan  ASA: II and emergent  Anesthesia Plan: General   Post-op Pain Management:    Induction: Intravenous  Airway Management Planned: Oral ETT  Additional Equipment:   Intra-op Plan:   Post-operative Plan: Extubation in OR  Informed Consent: I have reviewed the patients History and Physical, chart, labs and discussed the procedure including the risks, benefits and alternatives for the proposed anesthesia with the patient or authorized representative who has indicated his/her understanding and acceptance.   Dental advisory given  Plan Discussed with: CRNA  Anesthesia Plan Comments:         Anesthesia Quick Evaluation

## 2015-02-22 NOTE — Transfer of Care (Signed)
Immediate Anesthesia Transfer of Care Note  Patient: Heather [name]  Procedure(s) Performed: Procedure(s): OPEN REDUCTION INTERNAL FIXATION (ORIF) TIBIAL PLATEAU (Left) IRRIGATION AND DEBRIDEMENT EXTREMITY (Left)  Patient Location: PACU  Anesthesia Type:General  Level of Consciousness: awake, alert  and oriented  Airway & Oxygen Therapy: Patient Spontanous Breathing and Patient connected to nasal cannula oxygen  Post-op Assessment: Report given to RN, Post -op Vital signs reviewed and stable and Patient moving all extremities  Post vital signs: Reviewed and stable  Last Vitals:  Filed Vitals:   02/21/15 2005 02/22/15 0542  BP: 162/79 142/77  Pulse: 76 81  Temp: 36.8 C 37.2 C  Resp: 18 18    Complications: No apparent anesthesia complications
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
## 2015-02-23 ENCOUNTER — Encounter (HOSPITAL_COMMUNITY): Payer: Self-pay | Admitting: Orthopaedic Surgery

## 2015-02-23 LAB — GLUCOSE, CAPILLARY
GLUCOSE-CAPILLARY: 207 mg/dL — AB (ref 65–99)
GLUCOSE-CAPILLARY: 178 mg/dL — AB (ref 65–99)
Glucose-Capillary: 165 mg/dL — ABNORMAL HIGH (ref 65–99)
Glucose-Capillary: 101 mg/dL — ABNORMAL HIGH (ref 65–99)
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
## 2015-02-23 NOTE — Progress Notes (Signed)
   Subjective:  Patient reports pain as mild.    Objective:   VITALS:   Filed Vitals:   02/22/15 1754 02/22/15 2115 02/23/15 0103 02/23/15 0636  BP: 176/89 153/76 142/71 141/77  Pulse:  80 77 91  Temp:  98.9 F (37.2 C) 99.8 F (37.7 C) 98.9 F (37.2 C)  TempSrc:  Oral Oral Oral  Resp:  16 16 18   Height:      Weight:      SpO2:  98% 100% 100%    Neurologically intact ABD soft Neurovascular intact Sensation intact distally Intact pulses distally Dorsiflexion/Plantar flexion intact Incision: dressing C/D/I and no drainage No cellulitis present Compartment soft   Lab Results  Component Value Date   WBC 8.3 02/21/2015   HGB 10.2* 02/21/2015   HCT 31.8* 02/21/2015   MCV 85.9 02/21/2015   PLT 309 02/21/2015     Assessment/Plan:  1 Day Post-Op   - Expected postop acute blood loss anemia - will monitor for symptoms - Up with PT/OT - DVT ppx - SCDs, ambulation, aspirin - TDWB operative extremity - Pain control - Discharge planning - may go home today if she clears PT  Marianna Payment 02/23/2015, 7:56 AM [phone]
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
## 2015-02-24 ENCOUNTER — Encounter (HOSPITAL_COMMUNITY): Payer: Self-pay | Admitting: Orthopaedic Surgery

## 2015-02-24 LAB — GLUCOSE, CAPILLARY
GLUCOSE-CAPILLARY: 167 mg/dL — AB (ref 65–99)
GLUCOSE-CAPILLARY: 130 mg/dL — AB (ref 65–99)
Glucose-Capillary: 131 mg/dL — ABNORMAL HIGH (ref 65–99)
Glucose-Capillary: 193 mg/dL — ABNORMAL HIGH (ref 65–99)

## 2015-02-24 MED ORDER — SORBITOL 70 % SOLN
30.0000 mL | Freq: Every day | Status: DC | PRN
  Administered 2015-02-24: 30 mL via ORAL
  Filled 2015-02-24: qty 30

## 2015-02-24 NOTE — Anesthesia Postprocedure Evaluation (Signed)
Anesthesia Post Note  Patient: Heather [name]  Procedure(s) Performed: Procedure(s) (LRB): OPEN REDUCTION INTERNAL FIXATION (ORIF) TIBIAL PLATEAU (Left) IRRIGATION AND DEBRIDEMENT EXTREMITY (Left)  Patient location during evaluation: PACU Anesthesia Type: General Level of consciousness: awake and alert Pain management: pain level controlled Vital Signs Assessment: post-procedure vital signs reviewed and stable Respiratory status: spontaneous breathing Cardiovascular status: blood pressure returned to baseline Anesthetic complications: no    Last Vitals:  Filed Vitals:   02/24/15 1356 02/24/15 2046  BP: 124/65 119/69  Pulse: 99 96  Temp: 37.2 C 37.3 C  Resp: 18     Last Pain:  Filed Vitals:   02/24/15 2313  PainSc: Asleep                 Tiajuana Amass

## 2015-02-24 NOTE — Progress Notes (Signed)
   Subjective:  Patient reports pain as mild.    Objective:   VITALS:   Filed Vitals:   02/23/15 0636 02/23/15 1402 02/23/15 2134 02/24/15 0638  BP: 141/77 160/69 134/66 141/79  Pulse: 91 98 96 131  Temp: 98.9 F (37.2 C) 99 F (37.2 C) 98.8 F (37.1 C) 99 F (37.2 C)  TempSrc: Oral Oral Oral Oral  Resp: 18 16 16 18   Height:      Weight:      SpO2: 100% 100% 93% 98%    Neurologically intact ABD soft Neurovascular intact Sensation intact distally Intact pulses distally Dorsiflexion/Plantar flexion intact Incision: dressing C/D/I and no drainage No cellulitis present Compartment soft   Lab Results  Component Value Date   WBC 8.3 02/21/2015   HGB 10.2* 02/21/2015   HCT 31.8* 02/21/2015   MCV 85.9 02/21/2015   PLT 309 02/21/2015     Assessment/Plan:  2 Days Post-Op   - stable for dc to SNF when bed is available - Rx in chart - ASA for DVT ppx - bledsoe brace when OOB - up with PT  Marianna Payment 02/24/2015, 7:26 AM [phone]
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
## 2015-02-25 LAB — GLUCOSE, CAPILLARY
GLUCOSE-CAPILLARY: 100 mg/dL — AB (ref 65–99)
Glucose-Capillary: 312 mg/dL — ABNORMAL HIGH (ref 65–99)
Glucose-Capillary: 160 mg/dL — ABNORMAL HIGH (ref 65–99)
Glucose-Capillary: 89 mg/dL (ref 65–99)

## 2015-02-25 NOTE — Progress Notes (Signed)
Stable from ortho stand point for dc to SNF when bed is available

## 2015-02-25 NOTE — Clinical Social Work Note (Addendum)
CSW met with patient to discuss going to SNF for short term rehab.  Patient stated her insurance is changing on Monday, CSW was given the insurance policy number and will inform SNFs about insurance change.  Patient stated she lives alone, and needs some short term rehab before she can return back home.  CSW to fax out SNF for patient and complete formal assessment.  CSW informed patient that sometimes it may take two days for insurance approval, and she may have to go to a SNF that is not her preferred facility.    Jones Broom. Kabria Hetzer, MSW, [place] 02/25/2015 8:53 AM

## 2015-02-25 NOTE — Clinical Social Work Note (Signed)
Clinical Social Work Assessment  Patient Details  Name: Heather [name] MRN: [id number] Date of Birth: 1950/09/11  Date of referral:  02/25/15               Reason for consult:  Facility Placement                Permission sought to share information with:  Facility Sport and exercise psychologist, Family Supports Permission granted to share information::  Yes, Verbal Permission Granted  Name::     Lenn Sink, [place] [phone]  Agency::  SNF admissions  Relationship::     Contact Information:     Housing/Transportation Living arrangements for the past 2 months:  Single Family Home Source of Information:  Patient Patient Interpreter Needed:  None Criminal Activity/Legal Involvement Pertinent to Current Situation/Hospitalization:  No - Comment as needed Significant Relationships:  Other Family Members Lives with:  Self Do you feel safe going back to the place where you live?  Yes (Patient does not want to return home based on what happened she is planning to go to her sister's house after she has has some short term rehab.) Need for family participation in patient care:  No (Coment)  Care giving concerns:  Patient feels she needs some short term rehab in order to return back home.   Social Worker assessment / plan:  Patient is a pleasant alert and oriented x4 female who lives on her own.  Patient recently retired from working, and does not have any other family to help provide support for her.  Patient stated she has never been to rehab before and asked several questions regarding SNF placement.  CSW explained SNF search process and what to expect.  Patient has [employer] and she is in the process of switching plans.  Patient states she is interested in going to a SNF in [facility].  Patient expresses she did not have any other questions or concerns, CSW explained to patient that there is a possibility that if insurance has not approved, she may have to go to a  different facility or go home with home health.  Patient is aware of this and CSW explained to her that sometimes it takes a couple of days for insurance approval.  Employment status:  Part-Time Insurance information:  Managed Care PT Recommendations:  [place] / Referral to community resources:  [place]  Patient/Family's Response to care:  Patient in agreement to going to SNF for short term rehab.  Patient/Family's Understanding of and Emotional Response to Diagnosis, Current Treatment, and Prognosis:  Patient is aware of current treatment plan and prognosis.  Emotional Assessment Appearance:  Appears stated age Attitude/Demeanor/Rapport:    Affect (typically observed):  Appropriate, Pleasant Orientation:  Oriented to Self, Oriented to Place, Oriented to  Time, Oriented to Situation Alcohol / Substance use:  Not Applicable Psych involvement (Current and /or in the community):  No (Comment)  Discharge Needs  Concerns to be addressed:    Readmission within the last 30 days:  No Current discharge risk:  None Barriers to Discharge:  Insurance Authorization   Anell Barr 02/25/2015, 9:58 AM

## 2015-02-25 NOTE — Discharge Instructions (Signed)
Postoperative instructions:  Weightbearing: Touchdown weight bearing  Keep your dressing and/or splint clean and dry at all times.  You can remove your dressing on post-operative day #5 and change with a dry/sterile dressing or Band-Aids as needed thereafter.    Incision instructions:  Do not soak your incision for 3 weeks after surgery.  If the incision gets wet, pat dry and do not scrub the incision.  Pain control:  You have been given a prescription to be taken as directed for post-operative pain control.  In addition, elevate the operative extremity above the heart at all times to prevent swelling and throbbing pain.  Take over-the-counter Colace, 100mg  by mouth twice a day while taking narcotic pain medications to help prevent constipation.  Follow up appointments: 1) 10-14 days for suture removal and wound check. 2) Dr. Erlinda Hong as scheduled.   -------------------------------------------------------------------------------------------------------------  After Surgery Pain Control:  After your surgery, post-surgical discomfort or pain is likely. This discomfort can last several days to a few weeks. At certain times of the day your discomfort may be more intense.  Did you receive a nerve block?  A nerve block can provide pain relief for one hour to two days after your surgery. As long as the nerve block is working, you will experience little or no sensation in the area the surgeon operated on.  As the nerve block wears off, you will begin to experience pain or discomfort. It is very important that you begin taking your prescribed pain medication before the nerve block fully wears off. Treating your pain at the first sign of the block wearing off will ensure your pain is better controlled and more tolerable when full-sensation returns. Do not wait until the pain is intolerable, as the medicine will be less effective. It is better to treat pain in advance than to try and catch up.  General  Anesthesia:  If you did not receive a nerve block during your surgery, you will need to start taking your pain medication shortly after your surgery and should continue to do so as prescribed by your surgeon.  Pain Medication:  Most commonly we prescribe Vicodin and Percocet for post-operative pain. Both of these medications contain a combination of acetaminophen (Tylenol) and a narcotic to help control pain.   It takes between 30 and 45 minutes before pain medication starts to work. It is important to take your medication before your pain level gets too intense.   Nausea is a common side effect of many pain medications. You will want to eat something before taking your pain medicine to help prevent nausea.   If you are taking a prescription pain medication that contains acetaminophen, we recommend that you do not take additional over the counter acetaminophen (Tylenol).  Other pain relieving options:   Using a cold pack to ice the affected area a few times a day (15 to 20 minutes at a time) can help to relieve pain, reduce swelling and bruising.   Elevation of the affected area can also help to reduce pain and swelling.

## 2015-02-25 NOTE — Clinical Social Work Note (Signed)
Patient has been faxed out looking for SNF placements, patient does not have any bed offers at this time, patient is requesting [facility], [facility], or [facility].  CSW continuing to follow patient's progress and will present bed offers once patient has bed available.  Jones Broom. [place], MSW, [place] 02/25/2015 1:00 PM
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
## 2015-02-25 NOTE — Clinical Social Work Placement (Signed)
   CLINICAL SOCIAL WORK PLACEMENT  NOTE  Date:  02/25/2015  Patient Details  Name: Heather [name] MRN: [id number] Date of Birth: October 23, 1950  Clinical Social Work is seeking post-discharge placement for this patient at the [place] level of care (*CSW will initial, date and re-position this form in  chart as items are completed):  Yes   Patient/family provided with [place] Work Department's list of facilities offering this level of care within the geographic area requested by the patient (or if unable, by the patient's family).  Yes   Patient/family informed of their freedom to choose among providers that offer the needed level of care, that participate in Medicare, Medicaid or managed care program needed by the patient, have an available bed and are willing to accept the patient.  Yes   Patient/family informed of [facility]'s ownership interest in [facility] and [facility], as well as of the fact that they are under no obligation to receive care at these facilities.  PASRR submitted to EDS on 02/25/15     PASRR number received on 02/25/15     Existing PASRR number confirmed on       FL2 transmitted to all facilities in geographic area requested by pt/family on 02/25/15     FL2 transmitted to all facilities within larger geographic area on 02/25/15     Patient informed that his/her managed care company has contracts with or will negotiate with certain facilities, including the following:            Patient/family informed of bed offers received.  Patient chooses bed at       Physician recommends and patient chooses bed at      Patient to be transferred to   on  .  Patient to be transferred to facility by       Patient family notified on   of transfer.  Name of family member notified:        PHYSICIAN       Additional Comment:    _______________________________________________ Ross Ludwig, [place] 02/25/2015, 1:20  PM

## 2015-02-26 LAB — GLUCOSE, CAPILLARY
GLUCOSE-CAPILLARY: 149 mg/dL — AB (ref 65–99)
GLUCOSE-CAPILLARY: 142 mg/dL — AB (ref 65–99)
Glucose-Capillary: 204 mg/dL — ABNORMAL HIGH (ref 65–99)
Glucose-Capillary: 159 mg/dL — ABNORMAL HIGH (ref 65–99)

## 2015-02-26 NOTE — Progress Notes (Signed)
Gave bed offers.  Pt is leaning towards [facility].  Weekday SW to follow.  Bernita Raisin, [place] Social Work [phone]

## 2015-02-26 NOTE — Progress Notes (Signed)
Patient ID: Heather [name], female   DOB: May 11, 1950, 65 y.o.   MRN: [id number] No acute changes.  Awaiting SNF placement.  Left leg dressing clean and intact.  Calf soft.

## 2015-02-26 NOTE — Care Management Note (Signed)
Case Management Note  Patient Details  Name: Heather [name] MRN: [id number] Date of Birth: 30-Jun-1950  Subjective/Objective:                    Action/Plan: Anticipate discharge to SNF. No further CM needs but will be available should additional discharge needs arise.   Expected Discharge Date:                  Expected Discharge Plan:  Skilled Nursing Facility  In-House Referral:  Clinical Social Work  Discharge planning Services  CM Consult  Post Acute Care Choice:    Choice offered to:     DME Arranged:    DME Agency:     HH Arranged:    [place] Agency:     Status of Service:  Completed, signed off  Medicare Important Message Given:    Date Medicare IM Given:    Medicare IM give by:    Date Additional Medicare IM Given:    Additional Medicare Important Message give by:     If discussed at [place] of Stay Meetings, dates discussed:    Additional Comments:  Delrae Sawyers, RN 02/26/2015, 10:40 AM

## 2015-02-27 LAB — GLUCOSE, CAPILLARY
GLUCOSE-CAPILLARY: 115 mg/dL — AB (ref 65–99)
Glucose-Capillary: 211 mg/dL — ABNORMAL HIGH (ref 65–99)
Glucose-Capillary: 130 mg/dL — ABNORMAL HIGH (ref 65–99)
Glucose-Capillary: 165 mg/dL — ABNORMAL HIGH (ref 65–99)
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
## 2015-02-27 NOTE — Progress Notes (Signed)
Subjective: 5 Days Post-Op Procedure(s) (LRB): OPEN REDUCTION INTERNAL FIXATION (ORIF) TIBIAL PLATEAU (Left) IRRIGATION AND DEBRIDEMENT EXTREMITY (Left) Patient reports pain as mild.    Objective: Vital signs in last 24 hours: Temp:  [97.9 F (36.6 C)-98.4 F (36.9 C)] 98.2 F (36.8 C) (01/02 0651) Pulse Rate:  [79-80] 80 (01/02 0651) Resp:  [16-18] 18 (01/02 0651) BP: (112-142)/(65-76) 112/65 mmHg (01/02 0651) SpO2:  [93 %-96 %] 96 % (01/02 0651)  Intake/Output from previous day: 01/01 0701 - 01/02 0700 In: 1010 [P.O.:1010] Out: -  Intake/Output this shift: Total I/O In: 240 [P.O.:240] Out: -   No results for input(s): HGB in the last 72 hours. No results for input(s): WBC, RBC, HCT, PLT in the last 72 hours. No results for input(s): NA, K, CL, CO2, BUN, CREATININE, GLUCOSE, CALCIUM in the last 72 hours. No results for input(s): LABPT, INR in the last 72 hours.  Neuro intact foot.   Assessment/Plan: 5 Days Post-Op Procedure(s) (LRB): OPEN REDUCTION INTERNAL FIXATION (ORIF) TIBIAL PLATEAU (Left) IRRIGATION AND DEBRIDEMENT EXTREMITY (Left) Discharge to SNF pending approval.   Hulen Mandler C 02/27/2015, 8:43 AM

## 2015-02-27 NOTE — Clinical Social Work Note (Addendum)
CSW contacted [facility] who said they can not take patient today due to insurance company not being open and they are not able to get insurance approval.  CSW met with patient and explained that if patient is not approved for preferred SNF, she may have to consider going to a different facility who can take patient while insurance is being improved or patient would have to consider going home with home health.  Patient's expressed she is going to say some prayers and hope to get approval for SNF [facility] but is in agreement to going to different facility.  CSW to continue to follow patient's progress.  Jones Broom. Arad Burston, MSW, [place] 02/27/2015 10:31 AM

## 2015-02-28 LAB — GLUCOSE, CAPILLARY
GLUCOSE-CAPILLARY: 130 mg/dL — AB (ref 65–99)
Glucose-Capillary: 135 mg/dL — ABNORMAL HIGH (ref 65–99)
Glucose-Capillary: 205 mg/dL — ABNORMAL HIGH (ref 65–99)
Glucose-Capillary: 163 mg/dL — ABNORMAL HIGH (ref 65–99)
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
## 2015-02-28 NOTE — Progress Notes (Signed)
Pt states bed and equipment will not be delivered to her house until tomorrow

## 2015-02-28 NOTE — Clinical Social Work Note (Signed)
Heather [name] has retracted bed offer due to safety concerns. CSW shared information with patient and family. Patient was agreeable to going to Letter of Guarantee facility and requested [name] however [name] does NOT accept LOG's for insurance authorization.   Patient has decided to return home with a friend/family member and home health services. RNCM notified. RNCM has notified MD.  Clinical Social Worker will sign off for now as social work intervention is no longer needed. Please consult [place] again if new need arises.  Heather [name], MSW, LCSWA [phone] 02/28/2015 3:03 PM
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
## 2015-03-01 LAB — GLUCOSE, CAPILLARY
Glucose-Capillary: 152 mg/dL — ABNORMAL HIGH (ref 65–99)
Glucose-Capillary: 261 mg/dL — ABNORMAL HIGH (ref 65–99)

## 2015-03-01 NOTE — Care Management (Signed)
Case manager received call from [facility] with [facility], patient can not be accepeted , she is out of network with her agency. Case manager will speak with patient concerning setting up another agency.

## 2015-03-01 NOTE — Progress Notes (Signed)
Pt awaiting call from her friend to tell her the hosp bed has been delivered.Then they will come to take her to her friends house
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
## 2015-03-01 NOTE — Care Management (Signed)
Case manager spoke with patient concerning home health and insurance coverage. It appears that patient's BC/BS has lapsed. Rhett Bannister with [facility] states her office called to confirm benefits and was told no coverage. Case manager asked patient if she was aware and she said yes, she was told she needed to pay $19.95  but she wasn't sure what she needed to do next. Case manager provided her with her contact number for BC/BS. Also explained that at this time she will not have home health therapy because of lack of coverage.

## 2015-03-01 NOTE — Progress Notes (Signed)
Patient ID: Heather [name], female   DOB: 07/24/1950, 65 y.o.   MRN: [id number] No acute changes.  Can be discharged today.
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
## 2015-10-10 ENCOUNTER — Encounter (HOSPITAL_COMMUNITY): Payer: Self-pay | Admitting: Emergency Medicine

## 2015-10-10 ENCOUNTER — Ambulatory Visit (HOSPITAL_COMMUNITY)
Admission: EM | Admit: 2015-10-10 | Discharge: 2015-10-10 | Disposition: A | Discharge status: Home / Self Care | Payer: Medicare Other | Attending: Family Medicine | Admitting: Family Medicine

## 2015-10-10 DIAGNOSIS — E118 Type 2 diabetes mellitus with unspecified complications: Secondary | ICD-10-CM | POA: Diagnosis not present

## 2015-10-10 DIAGNOSIS — I10 Essential (primary) hypertension: Secondary | ICD-10-CM | POA: Diagnosis not present

## 2015-10-10 LAB — GLUCOSE, CAPILLARY: Glucose-Capillary: 306 mg/dL — ABNORMAL HIGH (ref 65–99)

## 2015-10-10 MED ORDER — GLIPIZIDE 10 MG PO TABS: 10.0000 mg | ORAL_TABLET | Freq: Two times a day (BID) | ORAL | 0 refills | Status: DC

## 2015-10-10 MED ORDER — LISINOPRIL-HYDROCHLOROTHIAZIDE 20-25 MG PO TABS: 1.0000 | ORAL_TABLET | Freq: Every day | ORAL | 0 refills | Status: DC

## 2015-10-10 MED ORDER — METFORMIN HCL 1000 MG PO TABS: 1000.0000 mg | ORAL_TABLET | Freq: Two times a day (BID) | ORAL | 0 refills | Status: DC

## 2015-10-10 MED ORDER — AMLODIPINE BESYLATE 5 MG PO TABS: 5.0000 mg | ORAL_TABLET | Freq: Every day | ORAL | 0 refills | Status: DC

## 2015-10-10 NOTE — ED Triage Notes (Signed)
The patient presented to the [facility] with a complaint of a headache and general body aches and weakness x 3 days. The patient stated that she has not been taking any of her prescribed medications in months and is now starting to "feel bad."

## 2015-10-10 NOTE — Discharge Instructions (Signed)
Current symptoms are probably due to elevated blood pressure and sugar levels. Recommend restart BP medications and diabetes medications (Rx provided today for 30 day supply). Need to find a new primary care provider for continued management of these conditions. If symptoms do not improve after restarting medication, recommend go to ER. Otherwise follow-up with a primary care provider as planned.
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
## 2015-10-25 DIAGNOSIS — E089 Diabetes mellitus due to underlying condition without complications: Secondary | ICD-10-CM | POA: Diagnosis not present

## 2015-10-25 DIAGNOSIS — Z7289 Other problems related to lifestyle: Secondary | ICD-10-CM | POA: Diagnosis not present

## 2015-10-25 DIAGNOSIS — I10 Essential (primary) hypertension: Secondary | ICD-10-CM | POA: Diagnosis not present

## 2015-10-25 DIAGNOSIS — G4700 Insomnia, unspecified: Secondary | ICD-10-CM | POA: Diagnosis not present

## 2015-10-25 DIAGNOSIS — R945 Abnormal results of liver function studies: Secondary | ICD-10-CM | POA: Diagnosis not present

## 2015-11-06 DIAGNOSIS — I10 Essential (primary) hypertension: Secondary | ICD-10-CM | POA: Diagnosis not present

## 2015-11-06 DIAGNOSIS — E088 Diabetes mellitus due to underlying condition with unspecified complications: Secondary | ICD-10-CM | POA: Diagnosis not present

## 2015-11-06 DIAGNOSIS — E782 Mixed hyperlipidemia: Secondary | ICD-10-CM | POA: Diagnosis not present

## 2015-11-06 DIAGNOSIS — E0842 Diabetes mellitus due to underlying condition with diabetic polyneuropathy: Secondary | ICD-10-CM | POA: Diagnosis not present

## 2015-11-09 DIAGNOSIS — Z23 Encounter for immunization: Secondary | ICD-10-CM | POA: Diagnosis not present

## 2015-11-17 DIAGNOSIS — Z1231 Encounter for screening mammogram for malignant neoplasm of breast: Secondary | ICD-10-CM | POA: Diagnosis not present

## 2015-11-17 DIAGNOSIS — M85852 Other specified disorders of bone density and structure, left thigh: Secondary | ICD-10-CM | POA: Diagnosis not present

## 2015-11-20 ENCOUNTER — Ambulatory Visit (HOSPITAL_COMMUNITY)
Admission: EM | Admit: 2015-11-20 | Discharge: 2015-11-20 | Disposition: A | Discharge status: Home / Self Care | Payer: Medicare Other | Attending: Family Medicine | Admitting: Family Medicine

## 2015-11-20 ENCOUNTER — Encounter (HOSPITAL_COMMUNITY): Payer: Self-pay | Admitting: Family Medicine

## 2015-11-20 DIAGNOSIS — L0100 Impetigo, unspecified: Secondary | ICD-10-CM

## 2015-11-20 MED ORDER — HYDROXYZINE HCL 25 MG PO TABS: 25.0000 mg | ORAL_TABLET | Freq: Four times a day (QID) | ORAL | 0 refills | Status: DC

## 2015-11-20 MED ORDER — AMOXICILLIN-POT CLAVULANATE 875-125 MG PO TABS: 1.0000 | ORAL_TABLET | Freq: Two times a day (BID) | ORAL | 0 refills | Status: DC

## 2015-11-20 NOTE — ED Triage Notes (Signed)
Rash to arms, chest, low abdomen and back.  Patient has very dry, scaly, and oozing areas on bilateral arms
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
## 2015-12-06 DIAGNOSIS — E782 Mixed hyperlipidemia: Secondary | ICD-10-CM | POA: Diagnosis not present

## 2015-12-06 DIAGNOSIS — E0842 Diabetes mellitus due to underlying condition with diabetic polyneuropathy: Secondary | ICD-10-CM | POA: Diagnosis not present

## 2015-12-06 DIAGNOSIS — E088 Diabetes mellitus due to underlying condition with unspecified complications: Secondary | ICD-10-CM | POA: Diagnosis not present

## 2015-12-06 DIAGNOSIS — I10 Essential (primary) hypertension: Secondary | ICD-10-CM | POA: Diagnosis not present

## 2015-12-19 DIAGNOSIS — Z23 Encounter for immunization: Secondary | ICD-10-CM | POA: Diagnosis not present

## 2015-12-20 DIAGNOSIS — E089 Diabetes mellitus due to underlying condition without complications: Secondary | ICD-10-CM | POA: Diagnosis not present

## 2015-12-20 DIAGNOSIS — E782 Mixed hyperlipidemia: Secondary | ICD-10-CM | POA: Diagnosis not present

## 2015-12-20 DIAGNOSIS — I10 Essential (primary) hypertension: Secondary | ICD-10-CM | POA: Diagnosis not present

## 2015-12-20 DIAGNOSIS — L0213 Carbuncle of neck: Secondary | ICD-10-CM | POA: Diagnosis not present

## 2016-01-01 ENCOUNTER — Emergency Department (HOSPITAL_COMMUNITY): Payer: Medicare Other

## 2016-01-01 ENCOUNTER — Observation Stay (HOSPITAL_COMMUNITY)
Admission: EM | Admit: 2016-01-01 | Discharge: 2016-01-04 | Disposition: A | Discharge status: Home / Self Care | Payer: Medicare Other | Attending: Cardiovascular Disease | Admitting: Cardiovascular Disease

## 2016-01-01 ENCOUNTER — Encounter (HOSPITAL_COMMUNITY): Payer: Self-pay | Admitting: Emergency Medicine

## 2016-01-01 DIAGNOSIS — G629 Polyneuropathy, unspecified: Secondary | ICD-10-CM

## 2016-01-01 DIAGNOSIS — R05 Cough: Secondary | ICD-10-CM | POA: Diagnosis not present

## 2016-01-01 DIAGNOSIS — Z79899 Other long term (current) drug therapy: Secondary | ICD-10-CM | POA: Diagnosis not present

## 2016-01-01 DIAGNOSIS — IMO0002 Reserved for concepts with insufficient information to code with codable children: Secondary | ICD-10-CM

## 2016-01-01 DIAGNOSIS — R079 Chest pain, unspecified: Secondary | ICD-10-CM | POA: Diagnosis not present

## 2016-01-01 DIAGNOSIS — Z7982 Long term (current) use of aspirin: Secondary | ICD-10-CM | POA: Diagnosis not present

## 2016-01-01 DIAGNOSIS — I10 Essential (primary) hypertension: Secondary | ICD-10-CM | POA: Diagnosis not present

## 2016-01-01 DIAGNOSIS — Z7984 Long term (current) use of oral hypoglycemic drugs: Secondary | ICD-10-CM | POA: Insufficient documentation

## 2016-01-01 DIAGNOSIS — E1159 Type 2 diabetes mellitus with other circulatory complications: Secondary | ICD-10-CM

## 2016-01-01 DIAGNOSIS — E1165 Type 2 diabetes mellitus with hyperglycemia: Secondary | ICD-10-CM

## 2016-01-01 DIAGNOSIS — R0789 Other chest pain: Secondary | ICD-10-CM

## 2016-01-01 DIAGNOSIS — E1140 Type 2 diabetes mellitus with diabetic neuropathy, unspecified: Secondary | ICD-10-CM | POA: Insufficient documentation

## 2016-01-01 DIAGNOSIS — E118 Type 2 diabetes mellitus with unspecified complications: Secondary | ICD-10-CM

## 2016-01-01 DIAGNOSIS — Z794 Long term (current) use of insulin: Secondary | ICD-10-CM

## 2016-01-01 DIAGNOSIS — I25119 Atherosclerotic heart disease of native coronary artery with unspecified angina pectoris: Principal | ICD-10-CM | POA: Insufficient documentation

## 2016-01-01 DIAGNOSIS — R072 Precordial pain: Secondary | ICD-10-CM | POA: Diagnosis not present

## 2016-01-01 DIAGNOSIS — E1139 Type 2 diabetes mellitus with other diabetic ophthalmic complication: Secondary | ICD-10-CM

## 2016-01-01 HISTORY — DX: Other chest pain: R07.89

## 2016-01-01 HISTORY — DX: Reserved for concepts with insufficient information to code with codable children: IMO0002

## 2016-01-01 HISTORY — DX: Type 2 diabetes mellitus with diabetic neuropathy, unspecified: E11.40

## 2016-01-01 LAB — COMPREHENSIVE METABOLIC PANEL
ALBUMIN: 3.8 g/dL (ref 3.5–5.0)
ALK PHOS: 86 U/L (ref 38–126)
ALT: 17 U/L (ref 14–54)
ANION GAP: 9 (ref 5–15)
AST: 18 U/L (ref 15–41)
BUN: 15 mg/dL (ref 6–20)
CALCIUM: 10.7 mg/dL — AB (ref 8.9–10.3)
CHLORIDE: 107 mmol/L (ref 101–111)
CO2: 26 mmol/L (ref 22–32)
Creatinine, Ser: 0.97 mg/dL (ref 0.44–1.00)
GFR calc non Af Amer: 60 mL/min — ABNORMAL LOW (ref >60)
GLUCOSE: 93 mg/dL (ref 65–99)
Potassium: 3.9 mmol/L (ref 3.5–5.1)
SODIUM: 142 mmol/L (ref 135–145)
Total Bilirubin: 0.5 mg/dL (ref 0.3–1.2)
Total Protein: 8.1 g/dL (ref 6.5–8.1)

## 2016-01-01 LAB — CBC WITH DIFFERENTIAL/PLATELET
BASOS PCT: 0 %
Basophils Absolute: 0 10*3/uL (ref 0.0–0.1)
EOS ABS: 0.4 10*3/uL (ref 0.0–0.7)
EOS PCT: 5 %
HCT: 45.6 % (ref 36.0–46.0)
HEMOGLOBIN: 14.9 g/dL (ref 12.0–15.0)
Lymphocytes Relative: 38 %
Lymphs Abs: 3.6 10*3/uL (ref 0.7–4.0)
MCH: 27.5 pg (ref 26.0–34.0)
MCHC: 32.7 g/dL (ref 30.0–36.0)
MCV: 84.3 fL (ref 78.0–100.0)
Monocytes Absolute: 0.6 10*3/uL (ref 0.1–1.0)
Monocytes Relative: 6 %
NEUTROS PCT: 51 %
Neutro Abs: 4.9 10*3/uL (ref 1.7–7.7)
PLATELETS: 371 10*3/uL (ref 150–400)
RBC: 5.41 MIL/uL — AB (ref 3.87–5.11)
RDW: 14.4 % (ref 11.5–15.5)
WBC: 9.5 10*3/uL (ref 4.0–10.5)

## 2016-01-01 LAB — D-DIMER, QUANTITATIVE (NOT AT ARMC): D-Dimer, Quant: 0.61 ug/mL-FEU — ABNORMAL HIGH (ref 0.00–0.50)

## 2016-01-01 LAB — TROPONIN I: Troponin I: 0.03 ng/mL (ref <0.03)

## 2016-01-01 MED ORDER — ONDANSETRON HCL 4 MG/2ML IJ SOLN: 4.0000 mg | Freq: Four times a day (QID) | INTRAMUSCULAR | Status: DC | PRN

## 2016-01-01 MED ORDER — MORPHINE SULFATE (PF) 4 MG/ML IV SOLN: 2.0000 mg | INTRAVENOUS | Status: DC | PRN

## 2016-01-01 MED ORDER — IOPAMIDOL (ISOVUE-370) INJECTION 76%
INTRAVENOUS | Status: AC
  Administered 2016-01-01: 100 mL
  Filled 2016-01-01: qty 100

## 2016-01-01 MED ORDER — ASPIRIN EC 325 MG PO TBEC
325.0000 mg | DELAYED_RELEASE_TABLET | Freq: Once | ORAL | Status: AC
  Administered 2016-01-01: 325 mg via ORAL
  Filled 2016-01-01: qty 1

## 2016-01-01 MED ORDER — INSULIN ASPART 100 UNIT/ML ~~LOC~~ SOLN
0.0000 [IU] | Freq: Three times a day (TID) | SUBCUTANEOUS | Status: DC
  Administered 2016-01-02: 3 [IU] via SUBCUTANEOUS
  Administered 2016-01-02: 1 [IU] via SUBCUTANEOUS
  Administered 2016-01-03: 2 [IU] via SUBCUTANEOUS
  Administered 2016-01-03: 1 [IU] via SUBCUTANEOUS
  Administered 2016-01-04: 2 [IU] via SUBCUTANEOUS
  Administered 2016-01-04: 3 [IU] via SUBCUTANEOUS

## 2016-01-01 MED ORDER — ONDANSETRON HCL 4 MG/2ML IJ SOLN: 4.0000 mg | Freq: Three times a day (TID) | INTRAMUSCULAR | Status: DC | PRN

## 2016-01-01 MED ORDER — ACETAMINOPHEN 325 MG PO TABS: 650.0000 mg | ORAL_TABLET | ORAL | Status: DC | PRN

## 2016-01-01 MED ORDER — GABAPENTIN 100 MG PO CAPS
100.0000 mg | ORAL_CAPSULE | Freq: Two times a day (BID) | ORAL | Status: DC
  Administered 2016-01-01 – 2016-01-04 (×6): 100 mg via ORAL
  Filled 2016-01-01 (×6): qty 1

## 2016-01-01 MED ORDER — GI COCKTAIL ~~LOC~~
30.0000 mL | Freq: Once | ORAL | Status: AC
  Administered 2016-01-01: 30 mL via ORAL
  Filled 2016-01-01: qty 30

## 2016-01-01 MED ORDER — LISINOPRIL 10 MG PO TABS
20.0000 mg | ORAL_TABLET | Freq: Every day | ORAL | Status: DC
  Administered 2016-01-02 – 2016-01-03 (×2): 20 mg via ORAL
  Filled 2016-01-01 (×2): qty 2

## 2016-01-01 MED ORDER — FAMOTIDINE 20 MG PO TABS
20.0000 mg | ORAL_TABLET | Freq: Two times a day (BID) | ORAL | Status: DC
Start: 2016-01-01 — End: 2016-01-04
  Administered 2016-01-01 – 2016-01-04 (×6): 20 mg via ORAL
  Filled 2016-01-01 (×6): qty 1

## 2016-01-01 MED ORDER — HYDROXYZINE HCL 25 MG PO TABS
25.0000 mg | ORAL_TABLET | Freq: Four times a day (QID) | ORAL | Status: DC
  Administered 2016-01-01 – 2016-01-03 (×6): 25 mg via ORAL
  Filled 2016-01-01 (×8): qty 1

## 2016-01-01 MED ORDER — HYDROCHLOROTHIAZIDE 25 MG PO TABS
25.0000 mg | ORAL_TABLET | Freq: Every day | ORAL | Status: DC
  Administered 2016-01-02 – 2016-01-03 (×2): 25 mg via ORAL
  Filled 2016-01-01 (×2): qty 1

## 2016-01-01 MED ORDER — AMLODIPINE BESYLATE 10 MG PO TABS
10.0000 mg | ORAL_TABLET | Freq: Every day | ORAL | Status: DC
  Administered 2016-01-02 – 2016-01-04 (×3): 10 mg via ORAL
  Filled 2016-01-01 (×3): qty 1

## 2016-01-01 MED ORDER — LISINOPRIL-HYDROCHLOROTHIAZIDE 20-25 MG PO TABS: 1.0000 | ORAL_TABLET | Freq: Every day | ORAL | Status: DC

## 2016-01-01 MED ORDER — ASPIRIN EC 81 MG PO TBEC
81.0000 mg | DELAYED_RELEASE_TABLET | Freq: Every day | ORAL | Status: DC
  Administered 2016-01-02: 81 mg via ORAL
  Filled 2016-01-01: qty 1
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
## 2016-01-01 NOTE — ED Notes (Signed)
Admitting MD at bedside.

## 2016-01-01 NOTE — ED Notes (Signed)
Pt transported to CT ?
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
## 2016-01-01 NOTE — ED Notes (Signed)
Spoke to lab, states they received blue top, will add d-dimer

## 2016-01-01 NOTE — ED Triage Notes (Addendum)
Pt c/o central chest pain off and on x's 2 days.  St's to day has been constant.  Pt denies nausea, vomiting or diaphoresis.  St's she has had a cough and felt short of breath at times.   Pt st's when she coughs or burps the pain increases.

## 2016-01-01 NOTE — ED Notes (Signed)
PA at bedside.

## 2016-01-02 ENCOUNTER — Observation Stay (HOSPITAL_COMMUNITY): Payer: Medicare Other

## 2016-01-02 ENCOUNTER — Telehealth: Payer: Self-pay | Admitting: *Deleted

## 2016-01-02 ENCOUNTER — Observation Stay (HOSPITAL_BASED_OUTPATIENT_CLINIC_OR_DEPARTMENT_OTHER): Payer: Medicare Other

## 2016-01-02 ENCOUNTER — Encounter (HOSPITAL_COMMUNITY): Payer: Self-pay | Admitting: Cardiology

## 2016-01-02 DIAGNOSIS — R079 Chest pain, unspecified: Secondary | ICD-10-CM | POA: Diagnosis not present

## 2016-01-02 DIAGNOSIS — R9439 Abnormal result of other cardiovascular function study: Secondary | ICD-10-CM | POA: Diagnosis not present

## 2016-01-02 DIAGNOSIS — R072 Precordial pain: Secondary | ICD-10-CM

## 2016-01-02 DIAGNOSIS — I10 Essential (primary) hypertension: Secondary | ICD-10-CM | POA: Diagnosis not present

## 2016-01-02 DIAGNOSIS — I25119 Atherosclerotic heart disease of native coronary artery with unspecified angina pectoris: Secondary | ICD-10-CM | POA: Diagnosis not present

## 2016-01-02 LAB — NM MYOCAR MULTI W/SPECT W/WALL MOTION / EF
CHL CUP NUCLEAR SRS: 7
CHL CUP STRESS STAGE 1 GRADE: 0 %
CHL CUP STRESS STAGE 1 SPEED: 0 mph
CHL CUP STRESS STAGE 2 GRADE: 0 %
CHL CUP STRESS STAGE 2 SPEED: 0 mph
CHL CUP STRESS STAGE 3 DBP: 79 mmHg
CHL CUP STRESS STAGE 3 GRADE: 0 %
CHL CUP STRESS STAGE 3 SBP: 150 mmHg
CHL CUP STRESS STAGE 4 HR: 93 {beats}/min
CHL CUP STRESS STAGE 4 SBP: 147 mmHg
CHL CUP STRESS STAGE 4 SPEED: 0 mph
LV sys vol: 29 mL
LVDIAVOL: 66 mL (ref 46–106)
MPHR: 155 {beats}/min
Percent HR: 69 %
RATE: 0.31
Rest HR: 73 {beats}/min
SDS: 2
SSS: 9
Stage 1 HR: 69 {beats}/min
Stage 2 HR: 69 {beats}/min
Stage 3 HR: 100 {beats}/min
Stage 3 Speed: 0 mph
Stage 4 DBP: 81 mmHg
Stage 4 Grade: 0 %
TID: 1.42

## 2016-01-02 LAB — GLUCOSE, CAPILLARY
GLUCOSE-CAPILLARY: 123 mg/dL — AB (ref 65–99)
GLUCOSE-CAPILLARY: 205 mg/dL — AB (ref 65–99)
GLUCOSE-CAPILLARY: 214 mg/dL — AB (ref 65–99)
Glucose-Capillary: 141 mg/dL — ABNORMAL HIGH (ref 65–99)

## 2016-01-02 LAB — ECHOCARDIOGRAM COMPLETE
Height: 67 in
Weight: 3227.2 oz

## 2016-01-02 LAB — TROPONIN I: Troponin I: <0.03 ng/mL (ref <0.03)

## 2016-01-02 LAB — HEMOGLOBIN A1C
HEMOGLOBIN A1C: 10.3 % — AB (ref 4.8–5.6)
MEAN PLASMA GLUCOSE: 249 mg/dL

## 2016-01-02 MED ORDER — SODIUM CHLORIDE 0.9% FLUSH
3.0000 mL | Freq: Two times a day (BID) | INTRAVENOUS | Status: DC
  Administered 2016-01-02 – 2016-01-03 (×2): 3 mL via INTRAVENOUS

## 2016-01-02 MED ORDER — SODIUM CHLORIDE 0.9 % IV SOLN: 250.0000 mL | INTRAVENOUS | Status: DC | PRN

## 2016-01-02 MED ORDER — SODIUM CHLORIDE 0.9 % IV SOLN
INTRAVENOUS | Status: DC
  Administered 2016-01-03: 09:00:00 via INTRAVENOUS

## 2016-01-02 MED ORDER — TECHNETIUM TC 99M TETROFOSMIN IV KIT
10.0000 | PACK | Freq: Once | INTRAVENOUS | Status: AC | PRN
  Administered 2016-01-02: 10 via INTRAVENOUS

## 2016-01-02 MED ORDER — SODIUM CHLORIDE 0.9% FLUSH: 3.0000 mL | INTRAVENOUS | Status: DC | PRN

## 2016-01-02 MED ORDER — ASPIRIN 81 MG PO CHEW
81.0000 mg | CHEWABLE_TABLET | ORAL | Status: AC
  Administered 2016-01-03: 81 mg via ORAL
  Filled 2016-01-02: qty 1

## 2016-01-02 MED ORDER — REGADENOSON 0.4 MG/5ML IV SOLN
0.4000 mg | Freq: Once | INTRAVENOUS | Status: AC
  Administered 2016-01-02: 0.4 mg via INTRAVENOUS
  Filled 2016-01-02: qty 5

## 2016-01-02 MED ORDER — REGADENOSON 0.4 MG/5ML IV SOLN
INTRAVENOUS | Status: AC
  Administered 2016-01-02: 0.4 mg via INTRAVENOUS
  Filled 2016-01-02: qty 5

## 2016-01-02 MED ORDER — TECHNETIUM TC 99M TETROFOSMIN IV KIT
30.0000 | PACK | Freq: Once | INTRAVENOUS | Status: AC | PRN
  Administered 2016-01-02: 30 via INTRAVENOUS

## 2016-01-02 NOTE — Progress Notes (Signed)
  Echocardiogram 2D Echocardiogram has been performed.  Darlina Sicilian M 01/02/2016, 12:12 PM

## 2016-01-02 NOTE — Progress Notes (Signed)
Pt arrived to 2w02. Able to ambulate from stretcher to bed. Tele box placed, verified x2. Pt in no distress, no SOB. Call bell within reach. Will continue to monitor.  Jaymes Graff, RN
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
## 2016-01-02 NOTE — Care Management Obs Status (Signed)
[facility] NOTIFICATION   Patient Details  Name: ARLENNE SANTELL MRN: [id number] Date of Birth: [birth year]   Medicare Observation Status Notification Given:  Yes (obs letter explained to patient, answered questions)    Apolonio Schneiders, RN 01/02/2016, 7:10 PM

## 2016-01-02 NOTE — Progress Notes (Signed)
PROGRESS NOTE  Heather [name] [id number] DOB: 22-Sep-1950 DOA: 01/01/2016 PCP: Elyn Peers, MD   LOS: 0 days   Brief Narrative: Heather [name] is a 65 y.o. woman with a history of HTN and DM who presents to the ED for evaluation of chest pain.  She has had recurrent episodes of the past 2-3 days.  Chest pain is typically substernal and nonradiating.  It is associated with shortness of breath and light-headedness.  She has not passed out.  She does not have any known history of CAD.  She has never seen a cardiologist.  She denies any prior cardiac testing, including echo, stress test, or heart cath. Underwent stress testing 11/7 which was positive, therefore cardiology was consulted.   Assessment & Plan: Principal Problem:   Chest pain Active Problems:   HTN (hypertension)   Diabetes (HCC)   Neuropathy (HCC)   Chest pain - positive stress testing, appreciate cardiology consult, may need cath  - on Aspirin - now chest pain free  HTN - continue Norvasc, HCTZ, Lisinopril  DM - SSI   DVT prophylaxis: SCD Code Status: Full Family Communication: no family bedside Disposition Plan: home when ready  Consultants:   Cardiology   Procedures:   2D echo:  Study Conclusions - Left ventricle: The cavity size was normal. Systolic function was normal. The estimated ejection fraction was in the range of 60% to 65%. Wall motion was normal; there were no regional wall motion abnormalities. Doppler parameters are consistent with abnormal left ventricular relaxation (grade 1 diastolic dysfunction). - Mitral valve: There was mild regurgitation. - Pulmonary arteries: Systolic pressure was mildly increased. PA peak pressure: 35 mm Hg (S).  Antimicrobials:  None   Subjective: - no chest pain, shortness of breath, no abdominal pain, nausea or vomiting.   Objective: Vitals:   01/02/16 0945 01/02/16 0946 01/02/16 1056 01/02/16 1435  BP:  (!) 147/81 134/75 123/72  Pulse: 96 93  81    Resp:    18  Temp:    97.9 F (36.6 C)  TempSrc:    Oral  SpO2:    100%  Weight:      Height:       No intake or output data in the 24 hours ending 01/02/16 1628 Filed Weights   01/01/16 1528 01/02/16 0310  Weight: 93.9 kg (207 lb) 91.5 kg (201 lb 11.2 oz)    Examination: Constitutional: NAD Vitals:   01/02/16 0945 01/02/16 0946 01/02/16 1056 01/02/16 1435  BP:  (!) 147/81 134/75 123/72  Pulse: 96 93  81  Resp:    18  Temp:    97.9 F (36.6 C)  TempSrc:    Oral  SpO2:    100%  Weight:      Height:       Eyes: PERRL, lids and conjunctivae normal ENMT: Mucous membranes are moist. No oropharyngeal exudates Respiratory: clear to auscultation bilaterally, no wheezing, no crackles. Normal respiratory effort.  Cardiovascular: Regular rate and rhythm, no murmurs / rubs / gallops. No LE edema. 2+ pedal pulses. No carotid bruits.  Abdomen: no tenderness. Bowel sounds positive. Neurologic: non focal    Data Reviewed: I have personally reviewed following labs and imaging studies  CBC:  Recent Labs Lab 01/01/16 1601  WBC 9.5  NEUTROABS 4.9  HGB 14.9  HCT 45.6  MCV 84.3  PLT 123456   Basic Metabolic Panel:  Recent Labs Lab 01/01/16 1601  NA 142  K 3.9  CL 107  CO2  26  GLUCOSE 93  BUN 15  CREATININE 0.97  CALCIUM 10.7*   GFR: Estimated Creatinine Clearance: 67.2 mL/min (by C-G formula based on SCr of 0.97 mg/dL). Liver Function Tests:  Recent Labs Lab 01/01/16 1601  AST 18  ALT 17  ALKPHOS 86  BILITOT 0.5  PROT 8.1  ALBUMIN 3.8   No results for input(s): LIPASE, AMYLASE in the last 168 hours. No results for input(s): AMMONIA in the last 168 hours. Coagulation Profile: No results for input(s): INR, PROTIME in the last 168 hours. Cardiac Enzymes:  Recent Labs Lab 01/01/16 1601 01/01/16 2311 01/02/16 0111 01/02/16 0407  TROPONINI 0.03* <0.03 <0.03 <0.03   BNP (last 3 results) No results for input(s): PROBNP in the last 8760 hours. HbA1C: No  results for input(s): HGBA1C in the last 72 hours. CBG:  Recent Labs Lab 01/02/16 0622 01/02/16 1059 01/02/16 1614  GLUCAP 123* 205* 141*   Lipid Profile: No results for input(s): CHOL, HDL, LDLCALC, TRIG, CHOLHDL, LDLDIRECT in the last 72 hours. Thyroid Function Tests: No results for input(s): TSH, T4TOTAL, FREET4, T3FREE, THYROIDAB in the last 72 hours. Anemia Panel: No results for input(s): VITAMINB12, FOLATE, FERRITIN, TIBC, IRON, RETICCTPCT in the last 72 hours. Urine analysis: No results found for: COLORURINE, APPEARANCEUR, LABSPEC, PHURINE, GLUCOSEU, HGBUR, BILIRUBINUR, KETONESUR, PROTEINUR, UROBILINOGEN, NITRITE, LEUKOCYTESUR Sepsis Labs: Invalid input(s): PROCALCITONIN, LACTICIDVEN  No results found for this or any previous visit (from the past 240 hour(s)).    Radiology Studies: Dg Chest 2 View  Result Date: 01/01/2016 CLINICAL DATA:  Central chest pain and cough. EXAM: CHEST  2 VIEW COMPARISON:  03/10/2012 FINDINGS: The heart size and mediastinal contours are within normal limits. There is no evidence of pulmonary edema, consolidation, pneumothorax, nodule or pleural fluid. The visualized skeletal structures are unremarkable. IMPRESSION: No active cardiopulmonary disease. Electronically Signed   By: Aletta Edouard M.D.   On: 01/01/2016 16:19   Ct Angio Chest Pe W And/or Wo Contrast  Result Date: 01/01/2016 CLINICAL DATA:  Chest pain, worse with exertion. Elevated D-dimer. Hypertension. Diabetes. EXAM: CT ANGIOGRAPHY CHEST WITH CONTRAST TECHNIQUE: Multidetector CT imaging of the chest was performed using the standard protocol during bolus administration of intravenous contrast. Multiplanar CT image reconstructions and MIPs were obtained to evaluate the vascular anatomy. CONTRAST:  80 cc of Isovue 370 COMPARISON:  Chest radiograph of earlier today. FINDINGS: Cardiovascular: Mild motion and patient body habitus degradation. The quality of this exam for evaluation of pulmonary  embolism is good. The bolus is well timed. No evidence of pulmonary embolism. Tortuous thoracic aorta. Mild aortic atherosclerosis. Moderate cardiomegaly. Pulmonary artery enlargement, outflow tract 3.2 cm. Mediastinum/Nodes: No mediastinal or hilar adenopathy. Lungs/Pleura: No pleural fluid. Mosaic attenuation is lower lobe predominant and favored to be due to hypoventilation/air trapping. No lobar consolidation. Upper Abdomen: Normal imaged portions of the liver, spleen, stomach, pancreas, adrenal glands, kidneys. Musculoskeletal: Mild thoracic spondylosis. Review of the MIP images confirms the above findings. IMPRESSION: 1.  No evidence of pulmonary embolism. 2. Mild limitations, secondary to motion and patient body habitus, as detailed above. 3. Cardiomegaly. 4.  Aortic atherosclerosis. 5. Pulmonary artery enlargement suggests pulmonary arterial hypertension. Electronically Signed   By: Abigail Miyamoto M.D.   On: 01/01/2016 18:41   Nm Myocar Multi W/spect W/wall Motion / Ef  Result Date: 01/02/2016  There was no ST segment deviation noted during stress.  No T wave inversion was noted during stress.  There is a medium defect of mild severity present in the  basal inferior, basal inferolateral, mid inferior and mid inferolateral location. The defect is reversible and consistent with ischemia. The patient has large pendulous breasts on RAW data so this could also be explained by shifting breast attenuation artifact.  This is a high risk study.  The left ventricular ejection fraction is normal (55-65%).  Nuclear stress EF: 56%.      Scheduled Meds: . amLODipine  10 mg Oral Daily  . aspirin EC  81 mg Oral Daily  . famotidine  20 mg Oral BID  . gabapentin  100 mg Oral BID  . hydrochlorothiazide  25 mg Oral Daily  . hydrOXYzine  25 mg Oral Q6H  . insulin aspart  0-9 Units Subcutaneous TID WC  . lisinopril  20 mg Oral Daily   Continuous Infusions:  Marzetta Board, MD, PhD [facility] Pager  [phone]  If 7PM-7AM, please contact night-coverage [email or web address] Password [facility] 01/02/2016, 4:28 PM

## 2016-01-03 ENCOUNTER — Encounter (HOSPITAL_COMMUNITY)
Admission: EM | Disposition: A | Discharge status: Home / Self Care | Payer: Self-pay | Source: Home / Self Care | Attending: Internal Medicine

## 2016-01-03 DIAGNOSIS — R071 Chest pain on breathing: Secondary | ICD-10-CM | POA: Diagnosis not present

## 2016-01-03 DIAGNOSIS — I2510 Atherosclerotic heart disease of native coronary artery without angina pectoris: Secondary | ICD-10-CM

## 2016-01-03 DIAGNOSIS — I25119 Atherosclerotic heart disease of native coronary artery with unspecified angina pectoris: Secondary | ICD-10-CM | POA: Diagnosis not present

## 2016-01-03 DIAGNOSIS — R0789 Other chest pain: Secondary | ICD-10-CM

## 2016-01-03 HISTORY — PX: CARDIAC CATHETERIZATION: SHX172

## 2016-01-03 LAB — PROTIME-INR
INR: 0.96
Prothrombin Time: 12.7 s (ref 11.4–15.2)

## 2016-01-03 LAB — POCT ACTIVATED CLOTTING TIME
ACTIVATED CLOTTING TIME: 164 s
Activated Clotting Time: 191 seconds

## 2016-01-03 LAB — BASIC METABOLIC PANEL
Anion gap: 8 (ref 5–15)
BUN: 16 mg/dL (ref 6–20)
CHLORIDE: 105 mmol/L (ref 101–111)
CO2: 22 mmol/L (ref 22–32)
CREATININE: 0.88 mg/dL (ref 0.44–1.00)
Calcium: 8.9 mg/dL (ref 8.9–10.3)
Glucose, Bld: 184 mg/dL — ABNORMAL HIGH (ref 65–99)
POTASSIUM: 4.4 mmol/L (ref 3.5–5.1)
SODIUM: 135 mmol/L (ref 135–145)

## 2016-01-03 LAB — CBC
HCT: 40.6 % (ref 36.0–46.0)
HEMOGLOBIN: 13 g/dL (ref 12.0–15.0)
MCH: 27.1 pg (ref 26.0–34.0)
MCHC: 32 g/dL (ref 30.0–36.0)
MCV: 84.6 fL (ref 78.0–100.0)
PLATELETS: 303 10*3/uL (ref 150–400)
RBC: 4.8 MIL/uL (ref 3.87–5.11)
RDW: 14.9 % (ref 11.5–15.5)
WBC: 8.3 10*3/uL (ref 4.0–10.5)

## 2016-01-03 LAB — GLUCOSE, CAPILLARY
Glucose-Capillary: 195 mg/dL — ABNORMAL HIGH (ref 65–99)
Glucose-Capillary: 149 mg/dL — ABNORMAL HIGH (ref 65–99)
Glucose-Capillary: 93 mg/dL (ref 65–99)
Glucose-Capillary: 197 mg/dL — ABNORMAL HIGH (ref 65–99)

## 2016-01-03 SURGERY — LEFT HEART CATH AND CORONARY ANGIOGRAPHY
Anesthesia: LOCAL

## 2016-01-03 MED ORDER — ISOSORBIDE MONONITRATE ER 30 MG PO TB24
30.0000 mg | ORAL_TABLET | Freq: Every day | ORAL | Status: DC
  Administered 2016-01-03 – 2016-01-04 (×2): 30 mg via ORAL
  Filled 2016-01-03 (×2): qty 1

## 2016-01-03 MED ORDER — NITROGLYCERIN 1 MG/10 ML FOR IR/CATH LAB
INTRA_ARTERIAL | Status: DC | PRN
  Administered 2016-01-03: 200 ug via INTRACORONARY

## 2016-01-03 MED ORDER — IOPAMIDOL (ISOVUE-370) INJECTION 76%
INTRAVENOUS | Status: AC
  Filled 2016-01-03: qty 100

## 2016-01-03 MED ORDER — SODIUM CHLORIDE 0.9 % IV SOLN: 250.0000 mL | INTRAVENOUS | Status: DC | PRN

## 2016-01-03 MED ORDER — IOPAMIDOL (ISOVUE-370) INJECTION 76%
INTRAVENOUS | Status: AC
  Filled 2016-01-03: qty 50

## 2016-01-03 MED ORDER — ENOXAPARIN SODIUM 40 MG/0.4ML ~~LOC~~ SOLN
40.0000 mg | SUBCUTANEOUS | Status: DC
  Administered 2016-01-04: 40 mg via SUBCUTANEOUS
  Filled 2016-01-03: qty 0.4

## 2016-01-03 MED ORDER — SODIUM CHLORIDE 0.9 % IV SOLN
INTRAVENOUS | Status: DC
  Administered 2016-01-03: 960 mL via INTRAVENOUS

## 2016-01-03 MED ORDER — LIDOCAINE HCL (PF) 1 % IJ SOLN
INTRAMUSCULAR | Status: DC | PRN
  Administered 2016-01-03: 2 mL
  Administered 2016-01-03: 15 mL

## 2016-01-03 MED ORDER — HEPARIN SODIUM (PORCINE) 1000 UNIT/ML IJ SOLN
INTRAMUSCULAR | Status: DC | PRN
  Administered 2016-01-03: 3500 [IU] via INTRAVENOUS

## 2016-01-03 MED ORDER — SODIUM CHLORIDE 0.9% FLUSH
3.0000 mL | Freq: Two times a day (BID) | INTRAVENOUS | Status: DC
Start: 2016-01-03 — End: 2016-01-04
  Administered 2016-01-03 – 2016-01-04 (×2): 3 mL via INTRAVENOUS

## 2016-01-03 MED ORDER — VERAPAMIL HCL 2.5 MG/ML IV SOLN
INTRAVENOUS | Status: DC | PRN
  Administered 2016-01-03: 10 mL via INTRA_ARTERIAL

## 2016-01-03 MED ORDER — SODIUM CHLORIDE 0.9 % IV SOLN
INTRAVENOUS | Status: DC
  Administered 2016-01-03: 23:00:00 via INTRAVENOUS

## 2016-01-03 MED ORDER — MIDAZOLAM HCL 2 MG/2ML IJ SOLN
INTRAMUSCULAR | Status: DC | PRN
  Administered 2016-01-03: 2 mg via INTRAVENOUS
  Administered 2016-01-03: 1 mg via INTRAVENOUS

## 2016-01-03 MED ORDER — LIDOCAINE HCL (PF) 1 % IJ SOLN
INTRAMUSCULAR | Status: AC
  Filled 2016-01-03: qty 30

## 2016-01-03 MED ORDER — DIAZEPAM 5 MG PO TABS: 5.0000 mg | ORAL_TABLET | ORAL | Status: DC | PRN

## 2016-01-03 MED ORDER — BISACODYL 10 MG RE SUPP
10.0000 mg | Freq: Once | RECTAL | Status: DC
  Filled 2016-01-03: qty 1

## 2016-01-03 MED ORDER — HEPARIN (PORCINE) IN NACL 2-0.9 UNIT/ML-% IJ SOLN
INTRAMUSCULAR | Status: AC
  Filled 2016-01-03: qty 1500

## 2016-01-03 MED ORDER — MIDAZOLAM HCL 2 MG/2ML IJ SOLN
INTRAMUSCULAR | Status: AC
  Filled 2016-01-03: qty 2

## 2016-01-03 MED ORDER — NITROGLYCERIN 1 MG/10 ML FOR IR/CATH LAB
INTRA_ARTERIAL | Status: AC
  Filled 2016-01-03: qty 10

## 2016-01-03 MED ORDER — ONDANSETRON HCL 4 MG/2ML IJ SOLN: 4.0000 mg | Freq: Four times a day (QID) | INTRAMUSCULAR | Status: DC | PRN

## 2016-01-03 MED ORDER — ASPIRIN 81 MG PO CHEW
81.0000 mg | CHEWABLE_TABLET | Freq: Every day | ORAL | Status: DC
  Administered 2016-01-04: 81 mg via ORAL
  Filled 2016-01-03: qty 1

## 2016-01-03 MED ORDER — HEPARIN (PORCINE) IN NACL 2-0.9 UNIT/ML-% IJ SOLN
INTRAMUSCULAR | Status: DC | PRN
  Administered 2016-01-03: 15:00:00

## 2016-01-03 MED ORDER — FENTANYL CITRATE (PF) 100 MCG/2ML IJ SOLN
INTRAMUSCULAR | Status: DC | PRN
  Administered 2016-01-03: 25 ug via INTRAVENOUS
  Administered 2016-01-03: 50 ug via INTRAVENOUS

## 2016-01-03 MED ORDER — ACETAMINOPHEN 325 MG PO TABS: 650.0000 mg | ORAL_TABLET | ORAL | Status: DC | PRN

## 2016-01-03 MED ORDER — FENTANYL CITRATE (PF) 100 MCG/2ML IJ SOLN
INTRAMUSCULAR | Status: AC
  Filled 2016-01-03: qty 2

## 2016-01-03 MED ORDER — IOPAMIDOL (ISOVUE-370) INJECTION 76%
INTRAVENOUS | Status: DC | PRN
  Administered 2016-01-03: 180 mL

## 2016-01-03 MED ORDER — SODIUM CHLORIDE 0.9% FLUSH: 3.0000 mL | INTRAVENOUS | Status: DC | PRN

## 2016-01-03 MED ORDER — VERAPAMIL HCL 2.5 MG/ML IV SOLN
INTRAVENOUS | Status: AC
  Filled 2016-01-03: qty 2

## 2016-01-03 MED ORDER — HEPARIN SODIUM (PORCINE) 1000 UNIT/ML IJ SOLN
INTRAMUSCULAR | Status: AC
  Filled 2016-01-03: qty 1

## 2016-01-03 SURGICAL SUPPLY — 15 items
CATH INFINITI 5 FR AR2 MOD (CATHETERS) ×1 IMPLANT
CATH INFINITI 5 FR JR5 (CATHETERS) ×1 IMPLANT
CATH INFINITI JR4 5F (CATHETERS) ×1 IMPLANT
CATH OPTITORQUE TIG 4.0 5F (CATHETERS) ×1 IMPLANT
DEVICE RAD COMP TR BAND LRG (VASCULAR PRODUCTS) ×1 IMPLANT
GLIDESHEATH SLEND SS 6F .021 (SHEATH) ×1 IMPLANT
GUIDEWIRE INQWIRE 1.5J.035X260 (WIRE) IMPLANT
INQWIRE 1.5J .035X260CM (WIRE) ×2
KIT HEART LEFT (KITS) ×2 IMPLANT
PACK CARDIAC CATHETERIZATION (CUSTOM PROCEDURE TRAY) ×2 IMPLANT
SHEATH PINNACLE 5F 10CM (SHEATH) ×1 IMPLANT
TRANSDUCER W/STOPCOCK (MISCELLANEOUS) ×2 IMPLANT
TUBING CIL FLEX 10 FLL-RA (TUBING) ×2 IMPLANT
WIRE EMERALD 3MM-J .035X260CM (WIRE) ×1 IMPLANT
WIRE HI TORQ VERSACORE-J 145CM (WIRE) ×1 IMPLANT

## 2016-01-03 NOTE — Interval H&P Note (Signed)
Cath Lab Visit (complete for each Cath Lab visit)  Clinical Evaluation Leading to the Procedure:   ACS: No.  Non-ACS:    Anginal Classification: CCS II  Anti-ischemic medical therapy: Minimal Therapy (1 class of medications)  Non-Invasive Test Results: High-risk stress test findings: cardiac mortality >3%/year  Prior CABG: No previous CABG      History and Physical Interval Note:  01/03/2016 1:45 PM  Heather [name]  has presented today for surgery, with the diagnosis of positive stress test  The various methods of treatment have been discussed with the patient and family. After consideration of risks, benefits and other options for treatment, the patient has consented to  Procedure(s): Left Heart Cath and Coronary Angiography (N/A) as a surgical intervention .  The patient's history has been reviewed, patient examined, no change in status, stable for surgery.  I have reviewed the patient's chart and labs.  Questions were answered to the patient's satisfaction.     Shelva Majestic

## 2016-01-03 NOTE — Research (Signed)
[employer] Study Informed Consent   Subject Name: Heather [name]  Subject met inclusion and exclusion criteria.  The informed consent form, study requirements and expectations were reviewed with the subject and questions and concerns were addressed prior to the signing of the consent form.  The subject verbalized understanding of the trial requirements.  The subject agreed to participate in the trial and signed the informed consent.  The informed consent was obtained prior to performance of any protocol-specific procedures for the subject.  A copy of the signed informed consent was given to the subject and a copy was placed in the subject's medical record.  Mable Fill, Marissa Nestle 01/03/2016, 11:11 AM

## 2016-01-03 NOTE — Progress Notes (Signed)
   The patient was seen earlier this morning.  Indications for coronary angiography were discussed in detail with the patient including the risks. She is awaiting coronary angiography. Was initially on team D census but now has been moved to team B: The patient was counseled to undergo left heart catheterization, coronary angiography, and possible percutaneous coronary intervention with stent implantation. The procedural risks and benefits were discussed in detail. The risks discussed included death, stroke, myocardial infarction, life-threatening bleeding, limb ischemia, kidney injury, allergy, and possible emergency cardiac surgery. The risk of these significant complications were estimated to occur less than 1% of the time. After discussion, the patient has agreed to proceed.  Story is atypical and I wouldn't be surprised if the nuclear study is false positive.

## 2016-01-03 NOTE — Progress Notes (Signed)
PROGRESS NOTE  Heather [name] [id number] DOB: 09-25-1950 DOA: 01/01/2016 PCP: Elyn Peers, MD   LOS: 0 days   Brief Narrative: Heather [name] is a 65 y.o. woman with a history of HTN and DM who presents to the ED for evaluation of chest pain.  She has had recurrent episodes of the past 2-3 days.  Chest pain is typically substernal and nonradiating.  It is associated with shortness of breath and light-headedness.  She has not passed out.  She does not have any known history of CAD.  She has never seen a cardiologist.  She denies any prior cardiac testing, including echo, stress test, or heart cath. Underwent stress testing 11/7 which was positive, therefore cardiology was consulted.   Assessment & Plan: Principal Problem:   Chest pain Active Problems:   HTN (hypertension)   Diabetes (HCC)   Neuropathy (HCC)   Chest pain - positive stress testing, appreciate cardiology consult, for cath today.  - on Aspirin - now chest pain free  HTN - continue Norvasc, hold  HCTZ, Lisinopril pre cath.   DM - SSI   DVT prophylaxis: SCD Code Status: Full Family Communication: no family bedside Disposition Plan: home when ready  Consultants:   Cardiology   Procedures:   2D echo:  Study Conclusions - Left ventricle: The cavity size was normal. Systolic function was normal. The estimated ejection fraction was in the range of 60% to 65%. Wall motion was normal; there were no regional wall motion abnormalities. Doppler parameters are consistent with abnormal left ventricular relaxation (grade 1 diastolic dysfunction). - Mitral valve: There was mild regurgitation. - Pulmonary arteries: Systolic pressure was mildly increased. PA peak pressure: 35 mm Hg (S).  Antimicrobials:  None   Subjective: - no chest pain, shortness of breath.  Awaiting cath   Objective: Vitals:   01/03/16 1446 01/03/16 1451 01/03/16 1456 01/03/16 1501  BP: 118/74 107/74 114/70 121/75  Pulse: 69 76 73 66    Resp: 17 18 17 18   Temp:      TempSrc:      SpO2: 97% 97% 97% 97%  Weight:      Height:        Intake/Output Summary (Last 24 hours) at 01/03/16 1559 Last data filed at 01/03/16 1408  Gross per 24 hour  Intake              360 ml  Output                0 ml  Net              360 ml   Filed Weights   01/01/16 1528 01/02/16 0310 01/03/16 0432  Weight: 93.9 kg (207 lb) 91.5 kg (201 lb 11.2 oz) 92.3 kg (203 lb 8 oz)    Examination: Constitutional: NAD Vitals:   01/03/16 1446 01/03/16 1451 01/03/16 1456 01/03/16 1501  BP: 118/74 107/74 114/70 121/75  Pulse: 69 76 73 66  Resp: 17 18 17 18   Temp:      TempSrc:      SpO2: 97% 97% 97% 97%  Weight:      Height:       Eyes: PERRL, lids and conjunctivae normal ENMT: Mucous membranes are moist. No oropharyngeal exudates Respiratory: clear to auscultation bilaterally, no wheezing, no crackles. Normal respiratory effort.  Cardiovascular: Regular rate and rhythm, no murmurs / rubs / gallops. No LE edema. 2+ pedal pulses. No carotid bruits.  Abdomen: no tenderness. Bowel sounds positive.  Neurologic: non focal    Data Reviewed: I have personally reviewed following labs and imaging studies  CBC:  Recent Labs Lab 01/01/16 1601 01/03/16 0317  WBC 9.5 8.3  NEUTROABS 4.9  --   HGB 14.9 13.0  HCT 45.6 40.6  MCV 84.3 84.6  PLT 371 [id number]   Basic Metabolic Panel:  Recent Labs Lab 01/01/16 1601 01/03/16 0317  NA 142 135  K 3.9 4.4  CL 107 105  CO2 26 22  GLUCOSE 93 184*  BUN 15 16  CREATININE 0.97 0.88  CALCIUM 10.7* 8.9   GFR: Estimated Creatinine Clearance: 74.4 mL/min (by C-G formula based on SCr of 0.88 mg/dL). Liver Function Tests:  Recent Labs Lab 01/01/16 1601  AST 18  ALT 17  ALKPHOS 86  BILITOT 0.5  PROT 8.1  ALBUMIN 3.8   No results for input(s): LIPASE, AMYLASE in the last 168 hours. No results for input(s): AMMONIA in the last 168 hours. Coagulation Profile:  Recent Labs Lab 01/03/16 0518   INR 0.96   Cardiac Enzymes:  Recent Labs Lab 01/01/16 1601 01/01/16 2311 01/02/16 0111 01/02/16 0407  TROPONINI 0.03* <0.03 <0.03 <0.03   BNP (last 3 results) No results for input(s): PROBNP in the last 8760 hours. HbA1C:  Recent Labs  01/01/16 2311  HGBA1C 10.3*   CBG:  Recent Labs Lab 01/02/16 1614 01/02/16 2133 01/03/16 0612 01/03/16 1120 01/03/16 1536  GLUCAP 141* 214* 195* 149* 93   Lipid Profile: No results for input(s): CHOL, HDL, LDLCALC, TRIG, CHOLHDL, LDLDIRECT in the last 72 hours. Thyroid Function Tests: No results for input(s): TSH, T4TOTAL, FREET4, T3FREE, THYROIDAB in the last 72 hours. Anemia Panel: No results for input(s): VITAMINB12, FOLATE, FERRITIN, TIBC, IRON, RETICCTPCT in the last 72 hours. Urine analysis: No results found for: COLORURINE, APPEARANCEUR, LABSPEC, PHURINE, GLUCOSEU, HGBUR, BILIRUBINUR, KETONESUR, PROTEINUR, UROBILINOGEN, NITRITE, LEUKOCYTESUR Sepsis Labs: Invalid input(s): PROCALCITONIN, LACTICIDVEN  No results found for this or any previous visit (from the past 240 hour(s)).    Radiology Studies: Dg Chest 2 View  Result Date: 01/01/2016 CLINICAL DATA:  Central chest pain and cough. EXAM: CHEST  2 VIEW COMPARISON:  03/10/2012 FINDINGS: The heart size and mediastinal contours are within normal limits. There is no evidence of pulmonary edema, consolidation, pneumothorax, nodule or pleural fluid. The visualized skeletal structures are unremarkable. IMPRESSION: No active cardiopulmonary disease. Electronically Signed   By: Aletta Edouard M.D.   On: 01/01/2016 16:19   Ct Angio Chest Pe W And/or Wo Contrast  Result Date: 01/01/2016 CLINICAL DATA:  Chest pain, worse with exertion. Elevated D-dimer. Hypertension. Diabetes. EXAM: CT ANGIOGRAPHY CHEST WITH CONTRAST TECHNIQUE: Multidetector CT imaging of the chest was performed using the standard protocol during bolus administration of intravenous contrast. Multiplanar CT image  reconstructions and MIPs were obtained to evaluate the vascular anatomy. CONTRAST:  80 cc of Isovue 370 COMPARISON:  Chest radiograph of earlier today. FINDINGS: Cardiovascular: Mild motion and patient body habitus degradation. The quality of this exam for evaluation of pulmonary embolism is good. The bolus is well timed. No evidence of pulmonary embolism. Tortuous thoracic aorta. Mild aortic atherosclerosis. Moderate cardiomegaly. Pulmonary artery enlargement, outflow tract 3.2 cm. Mediastinum/Nodes: No mediastinal or hilar adenopathy. Lungs/Pleura: No pleural fluid. Mosaic attenuation is lower lobe predominant and favored to be due to hypoventilation/air trapping. No lobar consolidation. Upper Abdomen: Normal imaged portions of the liver, spleen, stomach, pancreas, adrenal glands, kidneys. Musculoskeletal: Mild thoracic spondylosis. Review of the MIP images confirms the above findings. IMPRESSION: 1.  No evidence of pulmonary embolism. 2. Mild limitations, secondary to motion and patient body habitus, as detailed above. 3. Cardiomegaly. 4.  Aortic atherosclerosis. 5. Pulmonary artery enlargement suggests pulmonary arterial hypertension. Electronically Signed   By: Abigail Miyamoto M.D.   On: 01/01/2016 18:41   Nm Myocar Multi W/spect W/wall Motion / Ef  Result Date: 01/02/2016  There was no ST segment deviation noted during stress.  No T wave inversion was noted during stress.  There is a medium defect of mild severity present in the basal inferior, basal inferolateral, mid inferior and mid inferolateral location. The defect is reversible and consistent with ischemia. The patient has large pendulous breasts on RAW data so this could also be explained by shifting breast attenuation artifact.  This is a high risk study.  The left ventricular ejection fraction is normal (55-65%).  Nuclear stress EF: 56%.      Scheduled Meds: . [MAR Hold] amLODipine  10 mg Oral Daily  . [MAR Hold] aspirin EC  81 mg Oral  Daily  . [MAR Hold] bisacodyl  10 mg Rectal Once  . [MAR Hold] famotidine  20 mg Oral BID  . [MAR Hold] gabapentin  100 mg Oral BID  . [MAR Hold] insulin aspart  0-9 Units Subcutaneous TID WC  . sodium chloride flush  3 mL Intravenous Q12H   Continuous Infusions: . sodium chloride 125 mL/hr at 01/03/16 0857  . sodium chloride 960 mL (01/03/16 1548)    [place], Md.  [facility] Pager [phone]  If 7PM-7AM, please contact night-coverage [email or web address] Password [email or web address] 01/03/2016, 3:59 PM

## 2016-01-03 NOTE — H&P (View-Only) (Signed)
   The patient was seen earlier this morning.  Indications for coronary angiography were discussed in detail with the patient including the risks. She is awaiting coronary angiography. Was initially on team D census but now has been moved to team B: The patient was counseled to undergo left heart catheterization, coronary angiography, and possible percutaneous coronary intervention with stent implantation. The procedural risks and benefits were discussed in detail. The risks discussed included death, stroke, myocardial infarction, life-threatening bleeding, limb ischemia, kidney injury, allergy, and possible emergency cardiac surgery. The risk of these significant complications were estimated to occur less than 1% of the time. After discussion, the patient has agreed to proceed.  Story is atypical and I wouldn't be surprised if the nuclear study is false positive.

## 2016-01-03 NOTE — Progress Notes (Addendum)
Site area: RFA Site Prior to Removal:  Level 0 Pressure Applied For:25 min Manual:  yes  Patient Status During Pull: stable  Post Pull Site:  Level 0 Post Pull Instructions Given:  yes Post Pull Pulses Present: palpable Dressing Applied:  tegaderm Bedrest begins @ 1615 till 2015 Comments:   

## 2016-01-04 ENCOUNTER — Encounter (HOSPITAL_COMMUNITY): Payer: Self-pay | Admitting: Cardiovascular Disease

## 2016-01-04 DIAGNOSIS — I25118 Atherosclerotic heart disease of native coronary artery with other forms of angina pectoris: Secondary | ICD-10-CM

## 2016-01-04 DIAGNOSIS — R071 Chest pain on breathing: Secondary | ICD-10-CM | POA: Diagnosis not present

## 2016-01-04 DIAGNOSIS — I25119 Atherosclerotic heart disease of native coronary artery with unspecified angina pectoris: Secondary | ICD-10-CM | POA: Diagnosis not present

## 2016-01-04 LAB — GLUCOSE, CAPILLARY
GLUCOSE-CAPILLARY: 208 mg/dL — AB (ref 65–99)
Glucose-Capillary: 171 mg/dL — ABNORMAL HIGH (ref 65–99)

## 2016-01-04 MED ORDER — LISINOPRIL 10 MG PO TABS
20.0000 mg | ORAL_TABLET | Freq: Every day | ORAL | Status: DC
  Administered 2016-01-04: 20 mg via ORAL
  Filled 2016-01-04: qty 2

## 2016-01-04 MED ORDER — HYDROCHLOROTHIAZIDE 25 MG PO TABS
25.0000 mg | ORAL_TABLET | Freq: Every day | ORAL | Status: DC
  Administered 2016-01-04: 25 mg via ORAL
  Filled 2016-01-04: qty 1

## 2016-01-04 MED ORDER — ASPIRIN 81 MG PO CHEW: 81.0000 mg | CHEWABLE_TABLET | Freq: Every day | ORAL | 0 refills | Status: DC

## 2016-01-04 MED ORDER — ISOSORBIDE MONONITRATE ER 30 MG PO TB24: 30.0000 mg | ORAL_TABLET | Freq: Every day | ORAL | 0 refills | Status: DC

## 2016-01-04 MED ORDER — LISINOPRIL-HYDROCHLOROTHIAZIDE 20-25 MG PO TABS: 1.0000 | ORAL_TABLET | Freq: Every day | ORAL | Status: DC

## 2016-01-04 NOTE — Progress Notes (Signed)
Patient Name: Heather [name] Date of Encounter: 01/04/2016  Primary Cardiologist:   Hospital Problem List     Principal Problem:   Chest pain Active Problems:   HTN (hypertension)   Diabetes ([place])   Neuropathy ([place])     Subjective   Patient underwent cardiac catheterization yesterday. There is mild coronary disease. There was mild catheter related spasm. Oral nitrate has been recommended for medical therapy.  Inpatient Medications    Scheduled Meds: . amLODipine  10 mg Oral Daily  . aspirin  81 mg Oral Daily  . bisacodyl  10 mg Rectal Once  . enoxaparin (LOVENOX) injection  40 mg Subcutaneous Q24H  . famotidine  20 mg Oral BID  . gabapentin  100 mg Oral BID  . insulin aspart  0-9 Units Subcutaneous TID WC  . isosorbide mononitrate  30 mg Oral Daily  . lisinopril-hydrochlorothiazide  1 tablet Oral Daily  . sodium chloride flush  3 mL Intravenous Q12H   Continuous Infusions: . sodium chloride Stopped (01/03/16 2230)  . sodium chloride 125 mL/hr at 01/03/16 2230   PRN Meds: sodium chloride, acetaminophen, acetaminophen, diazepam, morphine injection, ondansetron (ZOFRAN) IV, ondansetron (ZOFRAN) IV, sodium chloride flush   Vital Signs    Vitals:   01/03/16 1730 01/03/16 1800 01/03/16 1919 01/04/16 0536  BP: 140/77 131/65 (!) 124/56 (!) 158/72  Pulse: 66 78 77 80  Resp: 18 18 18 18   Temp:   98 F (36.7 C) 98.1 F (36.7 C)  TempSrc:   Oral Oral  SpO2: 100% 99% 100% 100%  Weight:    204 lb 1.6 oz (92.6 kg)  Height:        Intake/Output Summary (Last 24 hours) at 01/04/16 0620 Last data filed at 01/03/16 1408  Gross per 24 hour  Intake                0 ml  Output                0 ml  Net                0 ml   Filed Weights   01/02/16 0310 01/03/16 0432 01/04/16 0536  Weight: 201 lb 11.2 oz (91.5 kg) 203 lb 8 oz (92.3 kg) 204 lb 1.6 oz (92.6 kg)    Physical Exam   Patient is oriented to person time and place. Affect is normal. Lungs are clear.  Respiratory effort is not labored. Cardiac exam reveals an S1 and S2. The right radial cath site is stable.  Labs    CBC  Recent Labs  01/01/16 1601 01/03/16 0317  WBC 9.5 8.3  NEUTROABS 4.9  --   HGB 14.9 13.0  HCT 45.6 40.6  MCV 84.3 84.6  PLT 371 [id number]   Basic Metabolic Panel  Recent Labs  01/01/16 1601 01/03/16 0317  NA 142 135  K 3.9 4.4  CL 107 105  CO2 26 22  GLUCOSE 93 184*  BUN 15 16  CREATININE 0.97 0.88  CALCIUM 10.7* 8.9   Liver Function Tests  Recent Labs  01/01/16 1601  AST 18  ALT 17  ALKPHOS 86  BILITOT 0.5  PROT 8.1  ALBUMIN 3.8   No results for input(s): LIPASE, AMYLASE in the last 72 hours. Cardiac Enzymes  Recent Labs  01/01/16 2311 01/02/16 0111 01/02/16 0407  TROPONINI <0.03 <0.03 <0.03   BNP Invalid input(s): POCBNP D-Dimer  Recent Labs  01/01/16 1721  DDIMER 0.61*   Hemoglobin  A1C  Recent Labs  01/01/16 2311  HGBA1C 10.3*   Fasting Lipid Panel No results for input(s): CHOL, HDL, LDLCALC, TRIG, CHOLHDL, LDLDIRECT in the last 72 hours. Thyroid Function Tests No results for input(s): TSH, T4TOTAL, T3FREE, THYROIDAB in the last 72 hours.  Invalid input(s): FREET3  Telemetry    - Personally Reviewed    There is normal sinus rhythm. There is one run of 4 beats of ventricular tachycardia with a rate of 145.  ECG        Radiology    Nm Myocar Multi W/spect W/wall Motion / Ef  Result Date: 01/02/2016  There was no ST segment deviation noted during stress.  No T wave inversion was noted during stress.  There is a medium defect of mild severity present in the basal inferior, basal inferolateral, mid inferior and mid inferolateral location. The defect is reversible and consistent with ischemia. The patient has large pendulous breasts on RAW data so this could also be explained by shifting breast attenuation artifact.  This is a high risk study.  The left ventricular ejection fraction is normal (55-65%).  Nuclear  stress EF: 56%.     Cardiac Studies     Patient Profile     Patient is being evaluated for chest pain.  Assessment & Plan       Chest pain      Catheterization reveals mild coronary disease. There was mild catheter related spasm. Recommendation is for treatment with isosorbide and medical follow-up of coronary disease. No further in-hospital treatment of coronary disease is needed.  Ventricular tachycardia    4 beats of ventricular tachycardia with a rate of 145 is noted on the monitor. There is normal LV function. I've chosen not to add a beta blocker because of the spasm noted in the cath lab. However this was probably catheter related spasm and ultimately beta blockade would not be contraindicated if needed. No further workup at this time.         HTN (hypertension)   Diabetes ([place])   Neuropathy ([place])   Dola Argyle, MD  01/04/2016, 6:20 AM

## 2016-01-04 NOTE — Telephone Encounter (Signed)
Opened in error

## 2016-01-04 NOTE — Progress Notes (Signed)
Order to discharge received.  IVs and telemetry removed, ccmd notified.  Pt notified ride who is en route.

## 2016-01-04 NOTE — Discharge Summary (Signed)
Physician Discharge Summary  Heather [name] [id number] DOB: [birth year] DOA: 01/01/2016  PCP: Elyn Peers, MD  Admit date: 01/01/2016 Discharge date: 01/04/2016  Admitted From: Home  Disposition:  Home   Recommendations for Outpatient Follow-up:  1. Follow up with PCP in 1-2 weeks 2. Please obtain BMP/CBC in one week 3. Needs to follow up with cardiologist for further titration of medications.     Discharge Condition: Stable.  CODE STATUS:Full code.  Diet recommendation: Heart Healthy   Brief/Interim Summary: Heather [name] a 65 y.o.woman with a history of HTN and DM who presents to the ED for evaluation of chest pain. She has had recurrent episodes of the past 2-3 days. Chest pain is typically substernal and nonradiating. It is associated with shortness of breath and light-headedness. She has not passed out. She does not have any known history of CAD. She has never seen a cardiologist. She denies any prior cardiac testing, including echo, stress test, or heart cath. Underwent stress testing 11/7 which was positive, therefore cardiology was consulted.   Assessment & Plan: Principal Problem:   Chest pain, angina/  Active Problems:   HTN (hypertension)   Diabetes (HCC)   Neuropathy (HCC)   Chest pain,.  - positive stress testing, appreciate cardiology consult, Cath with mild CAD.  -medical management. Patient was started on Imdur. Tolerating this medications.  - on Aspirin - now chest pain free -Ct Angio negative for PE>   HTN - continue Norvasc, resume   HCTZ, Lisinopril.   DM - SSI Resume home medications at discharge   Discharge Diagnoses:  Principal Problem:   Chest pain Active Problems:   HTN (hypertension)   Diabetes ([place])   Neuropathy (HCC)    Discharge Instructions  Discharge Instructions    Diet - low sodium heart healthy    Complete by:  As directed    Increase activity slowly    Complete by:  As directed        Medication List     STOP taking these medications   ALEVE 220 MG tablet Generic drug:  naproxen sodium   aspirin EC 325 MG tablet Replaced by:  aspirin 81 MG chewable tablet   metFORMIN 1000 MG tablet Commonly known as:  GLUCOPHAGE     TAKE these medications   amLODipine 10 MG tablet Commonly known as:  NORVASC Take 10 mg by mouth daily.   aspirin 81 MG chewable tablet Chew 1 tablet (81 mg total) by mouth daily. Start taking on:  01/05/2016 Replaces:  aspirin EC 325 MG tablet   gabapentin 100 MG capsule Commonly known as:  NEURONTIN Take 100 mg by mouth 2 (two) times daily.   glipiZIDE 10 MG tablet Commonly known as:  GLUCOTROL Take 1 tablet (10 mg total) by mouth 2 (two) times daily before a meal.   hydrOXYzine 25 MG tablet Commonly known as:  ATARAX/VISTARIL Take 1 tablet (25 mg total) by mouth every 6 (six) hours.   isosorbide mononitrate 30 MG 24 hr tablet Commonly known as:  IMDUR Take 1 tablet (30 mg total) by mouth daily. Start taking on:  01/05/2016   JARDIANCE 25 MG Tabs tablet Generic drug:  empagliflozin Take 25 mg by mouth daily.   JENTADUETO XR 06-998 MG Tb24 Generic drug:  Linagliptin-Metformin HCl ER Take 1 tablet by mouth every morning.   lisinopril-hydrochlorothiazide 20-25 MG tablet Commonly known as:  PRINZIDE,ZESTORETIC Take 1 tablet by mouth daily.   mupirocin ointment 2 % Commonly known as:  [employer]  Apply 1 application topically 3 (three) times daily as needed (to affected area).       No Known Allergies  Consultations:  Cardiology    Procedures/Studies: Dg Chest 2 View  Result Date: 01/01/2016 CLINICAL DATA:  Central chest pain and cough. EXAM: CHEST  2 VIEW COMPARISON:  03/10/2012 FINDINGS: The heart size and mediastinal contours are within normal limits. There is no evidence of pulmonary edema, consolidation, pneumothorax, nodule or pleural fluid. The visualized skeletal structures are unremarkable. IMPRESSION: No active cardiopulmonary  disease. Electronically Signed   By: Aletta Edouard M.D.   On: 01/01/2016 16:19   Ct Angio Chest Pe W And/or Wo Contrast  Result Date: 01/01/2016 CLINICAL DATA:  Chest pain, worse with exertion. Elevated D-dimer. Hypertension. Diabetes. EXAM: CT ANGIOGRAPHY CHEST WITH CONTRAST TECHNIQUE: Multidetector CT imaging of the chest was performed using the standard protocol during bolus administration of intravenous contrast. Multiplanar CT image reconstructions and MIPs were obtained to evaluate the vascular anatomy. CONTRAST:  80 cc of Isovue 370 COMPARISON:  Chest radiograph of earlier today. FINDINGS: Cardiovascular: Mild motion and patient body habitus degradation. The quality of this exam for evaluation of pulmonary embolism is good. The bolus is well timed. No evidence of pulmonary embolism. Tortuous thoracic aorta. Mild aortic atherosclerosis. Moderate cardiomegaly. Pulmonary artery enlargement, outflow tract 3.2 cm. Mediastinum/Nodes: No mediastinal or hilar adenopathy. Lungs/Pleura: No pleural fluid. Mosaic attenuation is lower lobe predominant and favored to be due to hypoventilation/air trapping. No lobar consolidation. Upper Abdomen: Normal imaged portions of the liver, spleen, stomach, pancreas, adrenal glands, kidneys. Musculoskeletal: Mild thoracic spondylosis. Review of the MIP images confirms the above findings. IMPRESSION: 1.  No evidence of pulmonary embolism. 2. Mild limitations, secondary to motion and patient body habitus, as detailed above. 3. Cardiomegaly. 4.  Aortic atherosclerosis. 5. Pulmonary artery enlargement suggests pulmonary arterial hypertension. Electronically Signed   By: Abigail Miyamoto M.D.   On: 01/01/2016 18:41   Nm Myocar Multi W/spect W/wall Motion / Ef  Result Date: 01/02/2016  There was no ST segment deviation noted during stress.  No T wave inversion was noted during stress.  There is a medium defect of mild severity present in the basal inferior, basal inferolateral,  mid inferior and mid inferolateral location. The defect is reversible and consistent with ischemia. The patient has large pendulous breasts on RAW data so this could also be explained by shifting breast attenuation artifact.  This is a high risk study.  The left ventricular ejection fraction is normal (55-65%).  Nuclear stress EF: 56%.     (Echo, Carotid, EGD, Colonoscopy, ERCP)    Subjective:   Discharge Exam: Vitals:   01/03/16 1919 01/04/16 0536  BP: (!) 124/56 (!) 158/72  Pulse: 77 80  Resp: 18 18  Temp: 98 F (36.7 C) 98.1 F (36.7 C)   Vitals:   01/03/16 1730 01/03/16 1800 01/03/16 1919 01/04/16 0536  BP: 140/77 131/65 (!) 124/56 (!) 158/72  Pulse: 66 78 77 80  Resp: 18 18 18 18   Temp:   98 F (36.7 C) 98.1 F (36.7 C)  TempSrc:   Oral Oral  SpO2: 100% 99% 100% 100%  Weight:    92.6 kg (204 lb 1.6 oz)  Height:        General: Pt is alert, awake, not in acute distress Cardiovascular: RRR, S1/S2 +, no rubs, no gallops Respiratory: CTA bilaterally, no wheezing, no rhonchi Abdominal: Soft, NT, ND, bowel sounds + Extremities: no edema, no cyanosis  The results of significant diagnostics from this hospitalization (including imaging, microbiology, ancillary and laboratory) are listed below for reference.     Microbiology: No results found for this or any previous visit (from the past 240 hour(s)).   Labs: BNP (last 3 results) No results for input(s): BNP in the last 8760 hours. Basic Metabolic Panel:  Recent Labs Lab 01/01/16 1601 01/03/16 0317  NA 142 135  K 3.9 4.4  CL 107 105  CO2 26 22  GLUCOSE 93 184*  BUN 15 16  CREATININE 0.97 0.88  CALCIUM 10.7* 8.9   Liver Function Tests:  Recent Labs Lab 01/01/16 1601  AST 18  ALT 17  ALKPHOS 86  BILITOT 0.5  PROT 8.1  ALBUMIN 3.8   No results for input(s): LIPASE, AMYLASE in the last 168 hours. No results for input(s): AMMONIA in the last 168 hours. CBC:  Recent Labs Lab 01/01/16 1601  01/03/16 0317  WBC 9.5 8.3  NEUTROABS 4.9  --   HGB 14.9 13.0  HCT 45.6 40.6  MCV 84.3 84.6  PLT 371 303   Cardiac Enzymes:  Recent Labs Lab 01/01/16 1601 01/01/16 2311 01/02/16 0111 01/02/16 0407  TROPONINI 0.03* <0.03 <0.03 <0.03   BNP: Invalid input(s): POCBNP CBG:  Recent Labs Lab 01/03/16 1120 01/03/16 1536 01/03/16 2116 01/04/16 0620 01/04/16 1125  GLUCAP 149* 93 197* 171* 208*   D-Dimer  Recent Labs  01/01/16 1721  DDIMER 0.61*   Hgb A1c  Recent Labs  01/01/16 2311  HGBA1C 10.3*   Lipid Profile No results for input(s): CHOL, HDL, LDLCALC, TRIG, CHOLHDL, LDLDIRECT in the last 72 hours. Thyroid function studies No results for input(s): TSH, T4TOTAL, T3FREE, THYROIDAB in the last 72 hours.  Invalid input(s): FREET3 Anemia work up No results for input(s): VITAMINB12, FOLATE, FERRITIN, TIBC, IRON, RETICCTPCT in the last 72 hours. Urinalysis No results found for: COLORURINE, APPEARANCEUR, LABSPEC, [place], GLUCOSEU, HGBUR, BILIRUBINUR, KETONESUR, PROTEINUR, UROBILINOGEN, NITRITE, LEUKOCYTESUR Sepsis Labs Invalid input(s): PROCALCITONIN,  WBC,  LACTICIDVEN Microbiology No results found for this or any previous visit (from the past 240 hour(s)).   Time coordinating discharge: Over 30 minutes  SIGNED:   Elmarie Shiley, MD  [facility] 01/04/2016, 12:06 PM Pager   If 7PM-7AM, please contact night-coverage [email or web address] Password [email or web address]

## 2016-01-09 DIAGNOSIS — F064 Anxiety disorder due to known physiological condition: Secondary | ICD-10-CM | POA: Diagnosis not present

## 2016-01-09 DIAGNOSIS — E089 Diabetes mellitus due to underlying condition without complications: Secondary | ICD-10-CM | POA: Diagnosis not present

## 2016-01-09 DIAGNOSIS — I209 Angina pectoris, unspecified: Secondary | ICD-10-CM | POA: Diagnosis not present

## 2016-01-09 DIAGNOSIS — E782 Mixed hyperlipidemia: Secondary | ICD-10-CM | POA: Diagnosis not present

## 2016-01-09 DIAGNOSIS — I10 Essential (primary) hypertension: Secondary | ICD-10-CM | POA: Diagnosis not present

## 2016-01-31 DIAGNOSIS — K635 Polyp of colon: Secondary | ICD-10-CM | POA: Diagnosis not present

## 2016-01-31 DIAGNOSIS — D125 Benign neoplasm of sigmoid colon: Secondary | ICD-10-CM | POA: Diagnosis not present

## 2016-01-31 DIAGNOSIS — Z1211 Encounter for screening for malignant neoplasm of colon: Secondary | ICD-10-CM | POA: Diagnosis not present

## 2016-02-06 DIAGNOSIS — Z1211 Encounter for screening for malignant neoplasm of colon: Secondary | ICD-10-CM | POA: Diagnosis not present

## 2016-02-06 DIAGNOSIS — K635 Polyp of colon: Secondary | ICD-10-CM | POA: Diagnosis not present

## 2016-03-16 ENCOUNTER — Ambulatory Visit (HOSPITAL_COMMUNITY)
Admission: EM | Admit: 2016-03-16 | Discharge: 2016-03-16 | Disposition: A | Discharge status: Home / Self Care | Payer: Medicare Other | Attending: Family Medicine | Admitting: Family Medicine

## 2016-03-16 ENCOUNTER — Encounter (HOSPITAL_COMMUNITY): Payer: Self-pay | Admitting: Emergency Medicine

## 2016-03-16 DIAGNOSIS — K529 Noninfective gastroenteritis and colitis, unspecified: Secondary | ICD-10-CM | POA: Diagnosis not present

## 2016-03-16 MED ORDER — ONDANSETRON 8 MG PO TBDP: 8.0000 mg | ORAL_TABLET | Freq: Three times a day (TID) | ORAL | 0 refills | Status: AC | PRN
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
## 2016-03-16 NOTE — Discharge Instructions (Signed)
Take the zofran as needed for nausea.  Take imodium Over the counter for the diarrhea.  I believe you have a stomach bug, this should get better on its own.

## 2016-03-16 NOTE — ED Triage Notes (Addendum)
PATIENT IS IN DEPARTMENT WITH SEVERAL FAMILY MEMBERS WITH SIMILAR SYMPTOMS.  Patient feels weak, has had vomiting and diarrhea

## 2016-05-10 DIAGNOSIS — I10 Essential (primary) hypertension: Secondary | ICD-10-CM | POA: Diagnosis not present

## 2016-05-10 DIAGNOSIS — E785 Hyperlipidemia, unspecified: Secondary | ICD-10-CM | POA: Diagnosis not present

## 2016-05-10 DIAGNOSIS — E7800 Pure hypercholesterolemia, unspecified: Secondary | ICD-10-CM | POA: Diagnosis not present

## 2016-05-10 DIAGNOSIS — E089 Diabetes mellitus due to underlying condition without complications: Secondary | ICD-10-CM | POA: Diagnosis not present

## 2016-05-23 ENCOUNTER — Emergency Department (HOSPITAL_COMMUNITY)
Admission: EM | Admit: 2016-05-23 | Discharge: 2016-05-23 | Disposition: A | Discharge status: Home / Self Care | Payer: Medicare Other | Source: Home / Self Care | Attending: Emergency Medicine | Admitting: Emergency Medicine

## 2016-05-23 ENCOUNTER — Encounter (HOSPITAL_COMMUNITY): Payer: Self-pay

## 2016-05-23 DIAGNOSIS — E1140 Type 2 diabetes mellitus with diabetic neuropathy, unspecified: Secondary | ICD-10-CM | POA: Insufficient documentation

## 2016-05-23 DIAGNOSIS — K611 Rectal abscess: Secondary | ICD-10-CM | POA: Diagnosis not present

## 2016-05-23 DIAGNOSIS — Z79899 Other long term (current) drug therapy: Secondary | ICD-10-CM | POA: Insufficient documentation

## 2016-05-23 DIAGNOSIS — L0231 Cutaneous abscess of buttock: Secondary | ICD-10-CM | POA: Diagnosis not present

## 2016-05-23 DIAGNOSIS — Z7984 Long term (current) use of oral hypoglycemic drugs: Secondary | ICD-10-CM | POA: Insufficient documentation

## 2016-05-23 DIAGNOSIS — Z7982 Long term (current) use of aspirin: Secondary | ICD-10-CM

## 2016-05-23 DIAGNOSIS — M5489 Other dorsalgia: Secondary | ICD-10-CM | POA: Diagnosis not present

## 2016-05-23 DIAGNOSIS — I10 Essential (primary) hypertension: Secondary | ICD-10-CM | POA: Insufficient documentation

## 2016-05-23 DIAGNOSIS — N179 Acute kidney failure, unspecified: Secondary | ICD-10-CM | POA: Diagnosis not present

## 2016-05-23 DIAGNOSIS — R935 Abnormal findings on diagnostic imaging of other abdominal regions, including retroperitoneum: Secondary | ICD-10-CM | POA: Diagnosis not present

## 2016-05-23 DIAGNOSIS — A419 Sepsis, unspecified organism: Secondary | ICD-10-CM | POA: Diagnosis not present

## 2016-05-23 DIAGNOSIS — L03115 Cellulitis of right lower limb: Secondary | ICD-10-CM | POA: Diagnosis not present

## 2016-05-23 MED ORDER — CEPHALEXIN 500 MG PO CAPS: 500.0000 mg | ORAL_CAPSULE | Freq: Four times a day (QID) | ORAL | 0 refills | Status: DC

## 2016-05-23 MED ORDER — BENZONATATE 100 MG PO CAPS: 100.0000 mg | ORAL_CAPSULE | Freq: Three times a day (TID) | ORAL | 0 refills | Status: DC

## 2016-05-23 MED ORDER — MORPHINE SULFATE 15 MG PO TABS: 15.0000 mg | ORAL_TABLET | ORAL | 0 refills | Status: DC | PRN

## 2016-05-23 MED ORDER — ACETAMINOPHEN 500 MG PO TABS
1000.0000 mg | ORAL_TABLET | Freq: Once | ORAL | Status: AC
Start: 2016-05-23 — End: 2016-05-23
  Administered 2016-05-23: 1000 mg via ORAL
  Filled 2016-05-23: qty 2

## 2016-05-23 MED ORDER — SULFAMETHOXAZOLE-TRIMETHOPRIM 800-160 MG PO TABS: 1.0000 | ORAL_TABLET | Freq: Two times a day (BID) | ORAL | 0 refills | Status: AC

## 2016-05-23 MED ORDER — IBUPROFEN 400 MG PO TABS
400.0000 mg | ORAL_TABLET | Freq: Once | ORAL | Status: AC
  Administered 2016-05-23: 400 mg via ORAL
  Filled 2016-05-23: qty 1

## 2016-05-23 MED ORDER — IBUPROFEN 400 MG PO TABS
400.0000 mg | ORAL_TABLET | Freq: Once | ORAL | Status: AC | PRN
  Administered 2016-05-23: 400 mg via ORAL

## 2016-05-23 MED ORDER — LIDOCAINE-EPINEPHRINE (PF) 2 %-1:200000 IJ SOLN
20.0000 mL | Freq: Once | INTRAMUSCULAR | Status: AC
  Administered 2016-05-23: 20 mL via INTRADERMAL
  Filled 2016-05-23: qty 20

## 2016-05-23 MED ORDER — IBUPROFEN 400 MG PO TABS
ORAL_TABLET | ORAL | Status: AC
  Filled 2016-05-23: qty 1

## 2016-05-23 MED ORDER — OXYCODONE HCL 5 MG PO TABS
5.0000 mg | ORAL_TABLET | Freq: Once | ORAL | Status: AC
  Administered 2016-05-23: 5 mg via ORAL
  Filled 2016-05-23: qty 1

## 2016-05-23 NOTE — ED Triage Notes (Signed)
Per Pt, Pt is coming from home with complaints of boil noted to the right buttocks. Denies fever or drainage.

## 2016-05-23 NOTE — Discharge Instructions (Signed)
Warm compresses 4x a day, follow up in 48 hours for wound recheck.  Return sooner for fever, worsening pain.
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
## 2016-05-23 NOTE — ED Notes (Signed)
ED Provider at bedside. 

## 2016-05-25 ENCOUNTER — Emergency Department (HOSPITAL_COMMUNITY): Payer: Medicare Other

## 2016-05-25 ENCOUNTER — Encounter (HOSPITAL_COMMUNITY): Payer: Self-pay | Admitting: *Deleted

## 2016-05-25 ENCOUNTER — Inpatient Hospital Stay (HOSPITAL_COMMUNITY)
Admission: EM | Admit: 2016-05-25 | Discharge: 2016-05-28 | DRG: 854 | Disposition: A | Discharge status: Home / Self Care | Payer: Medicare Other | Attending: Internal Medicine | Admitting: Internal Medicine

## 2016-05-25 DIAGNOSIS — L03115 Cellulitis of right lower limb: Secondary | ICD-10-CM

## 2016-05-25 DIAGNOSIS — E1139 Type 2 diabetes mellitus with other diabetic ophthalmic complication: Secondary | ICD-10-CM

## 2016-05-25 DIAGNOSIS — E11628 Type 2 diabetes mellitus with other skin complications: Secondary | ICD-10-CM

## 2016-05-25 DIAGNOSIS — Z7984 Long term (current) use of oral hypoglycemic drugs: Secondary | ICD-10-CM | POA: Diagnosis not present

## 2016-05-25 DIAGNOSIS — Z8249 Family history of ischemic heart disease and other diseases of the circulatory system: Secondary | ICD-10-CM

## 2016-05-25 DIAGNOSIS — A419 Sepsis, unspecified organism: Secondary | ICD-10-CM | POA: Diagnosis not present

## 2016-05-25 DIAGNOSIS — K611 Rectal abscess: Secondary | ICD-10-CM | POA: Diagnosis present

## 2016-05-25 DIAGNOSIS — E876 Hypokalemia: Secondary | ICD-10-CM | POA: Diagnosis present

## 2016-05-25 DIAGNOSIS — E118 Type 2 diabetes mellitus with unspecified complications: Secondary | ICD-10-CM

## 2016-05-25 DIAGNOSIS — E1140 Type 2 diabetes mellitus with diabetic neuropathy, unspecified: Secondary | ICD-10-CM | POA: Diagnosis present

## 2016-05-25 DIAGNOSIS — I10 Essential (primary) hypertension: Secondary | ICD-10-CM | POA: Diagnosis present

## 2016-05-25 DIAGNOSIS — L0231 Cutaneous abscess of buttock: Secondary | ICD-10-CM

## 2016-05-25 DIAGNOSIS — Z7982 Long term (current) use of aspirin: Secondary | ICD-10-CM | POA: Diagnosis not present

## 2016-05-25 DIAGNOSIS — E1159 Type 2 diabetes mellitus with other circulatory complications: Secondary | ICD-10-CM | POA: Diagnosis present

## 2016-05-25 DIAGNOSIS — E1165 Type 2 diabetes mellitus with hyperglycemia: Secondary | ICD-10-CM

## 2016-05-25 DIAGNOSIS — I2510 Atherosclerotic heart disease of native coronary artery without angina pectoris: Secondary | ICD-10-CM | POA: Diagnosis present

## 2016-05-25 DIAGNOSIS — Z823 Family history of stroke: Secondary | ICD-10-CM

## 2016-05-25 DIAGNOSIS — N179 Acute kidney failure, unspecified: Secondary | ICD-10-CM | POA: Diagnosis not present

## 2016-05-25 DIAGNOSIS — R935 Abnormal findings on diagnostic imaging of other abdominal regions, including retroperitoneum: Secondary | ICD-10-CM

## 2016-05-25 DIAGNOSIS — E119 Type 2 diabetes mellitus without complications: Secondary | ICD-10-CM | POA: Diagnosis not present

## 2016-05-25 DIAGNOSIS — Z794 Long term (current) use of insulin: Secondary | ICD-10-CM

## 2016-05-25 DIAGNOSIS — M5489 Other dorsalgia: Secondary | ICD-10-CM | POA: Diagnosis present

## 2016-05-25 HISTORY — DX: Sepsis, unspecified organism: A41.9

## 2016-05-25 HISTORY — DX: Cutaneous abscess of buttock: L02.31

## 2016-05-25 LAB — URINALYSIS, ROUTINE W REFLEX MICROSCOPIC
Glucose, UA: >=500 mg/dL — AB
HGB URINE DIPSTICK: NEGATIVE
Ketones, ur: 5 mg/dL — AB
LEUKOCYTES UA: NEGATIVE
Nitrite: NEGATIVE
Protein, ur: 100 mg/dL — AB
SPECIFIC GRAVITY, URINE: 1.025 (ref 1.005–1.030)
pH: 5 (ref 5.0–8.0)

## 2016-05-25 LAB — COMPREHENSIVE METABOLIC PANEL
ALBUMIN: 3.3 g/dL — AB (ref 3.5–5.0)
ALT: 14 U/L (ref 14–54)
ANION GAP: 11 (ref 5–15)
AST: 18 U/L (ref 15–41)
Alkaline Phosphatase: 118 U/L (ref 38–126)
BUN: 15 mg/dL (ref 6–20)
CHLORIDE: 97 mmol/L — AB (ref 101–111)
CO2: 27 mmol/L (ref 22–32)
Calcium: 9.7 mg/dL (ref 8.9–10.3)
Creatinine, Ser: 1.25 mg/dL — ABNORMAL HIGH (ref 0.44–1.00)
GFR calc non Af Amer: 44 mL/min — ABNORMAL LOW (ref >60)
GFR, EST AFRICAN AMERICAN: 51 mL/min — AB (ref >60)
GLUCOSE: 322 mg/dL — AB (ref 65–99)
Potassium: 3.6 mmol/L (ref 3.5–5.1)
SODIUM: 135 mmol/L (ref 135–145)
Total Bilirubin: 0.6 mg/dL (ref 0.3–1.2)
Total Protein: 8.3 g/dL — ABNORMAL HIGH (ref 6.5–8.1)

## 2016-05-25 LAB — CBC WITH DIFFERENTIAL/PLATELET
Basophils Absolute: 0 K/uL (ref 0.0–0.1)
Basophils Relative: 0 %
Eosinophils Absolute: 0.1 K/uL (ref 0.0–0.7)
Eosinophils Relative: 1 %
HCT: 44 % (ref 36.0–46.0)
Hemoglobin: 14.4 g/dL (ref 12.0–15.0)
Lymphocytes Relative: 18 %
Lymphs Abs: 1.9 K/uL (ref 0.7–4.0)
MCH: 27 pg (ref 26.0–34.0)
MCHC: 32.7 g/dL (ref 30.0–36.0)
MCV: 82.4 fL (ref 78.0–100.0)
Monocytes Absolute: 0.6 K/uL (ref 0.1–1.0)
Monocytes Relative: 6 %
Neutro Abs: 8.1 K/uL — ABNORMAL HIGH (ref 1.7–7.7)
Neutrophils Relative %: 75 %
Platelets: 385 K/uL (ref 150–400)
RBC: 5.34 MIL/uL — ABNORMAL HIGH (ref 3.87–5.11)
RDW: 13.3 % (ref 11.5–15.5)
WBC: 10.7 K/uL — ABNORMAL HIGH (ref 4.0–10.5)

## 2016-05-25 LAB — I-STAT CG4 LACTIC ACID, ED: Lactic Acid, Venous: 2.41 mmol/L (ref 0.5–1.9)

## 2016-05-25 LAB — LACTIC ACID, PLASMA
LACTIC ACID, VENOUS: 1.4 mmol/L (ref 0.5–1.9)
Lactic Acid, Venous: 1.1 mmol/L (ref 0.5–1.9)

## 2016-05-25 LAB — PROCALCITONIN: PROCALCITONIN: 0.2 ng/mL

## 2016-05-25 LAB — GLUCOSE, CAPILLARY: GLUCOSE-CAPILLARY: 206 mg/dL — AB (ref 65–99)

## 2016-05-25 MED ORDER — BENZONATATE 100 MG PO CAPS: 100.0000 mg | ORAL_CAPSULE | Freq: Three times a day (TID) | ORAL | Status: DC | PRN

## 2016-05-25 MED ORDER — SODIUM CHLORIDE 0.9 % IV SOLN
INTRAVENOUS | Status: DC
  Administered 2016-05-25: 1000 mL via INTRAVENOUS

## 2016-05-25 MED ORDER — MORPHINE SULFATE (PF) 4 MG/ML IV SOLN
2.0000 mg | INTRAVENOUS | Status: DC | PRN
  Administered 2016-05-26: 2 mg via INTRAVENOUS
  Filled 2016-05-25: qty 1

## 2016-05-25 MED ORDER — VANCOMYCIN HCL IN DEXTROSE 1-5 GM/200ML-% IV SOLN
1000.0000 mg | Freq: Once | INTRAVENOUS | Status: AC
  Administered 2016-05-25: 1000 mg via INTRAVENOUS
  Filled 2016-05-25: qty 200

## 2016-05-25 MED ORDER — ACETAMINOPHEN 650 MG RE SUPP: 650.0000 mg | Freq: Four times a day (QID) | RECTAL | Status: DC | PRN

## 2016-05-25 MED ORDER — ONDANSETRON HCL 4 MG PO TABS: 4.0000 mg | ORAL_TABLET | Freq: Four times a day (QID) | ORAL | Status: DC | PRN

## 2016-05-25 MED ORDER — ASPIRIN 81 MG PO CHEW
81.0000 mg | CHEWABLE_TABLET | Freq: Every day | ORAL | Status: DC
  Administered 2016-05-25 – 2016-05-28 (×4): 81 mg via ORAL
  Filled 2016-05-25 (×4): qty 1

## 2016-05-25 MED ORDER — HYDROCODONE-ACETAMINOPHEN 5-325 MG PO TABS: 1.0000 | ORAL_TABLET | ORAL | Status: DC | PRN

## 2016-05-25 MED ORDER — INSULIN ASPART 100 UNIT/ML ~~LOC~~ SOLN
0.0000 [IU] | Freq: Every day | SUBCUTANEOUS | Status: DC
  Administered 2016-05-25: 2 [IU] via SUBCUTANEOUS

## 2016-05-25 MED ORDER — VANCOMYCIN HCL IN DEXTROSE 750-5 MG/150ML-% IV SOLN
750.0000 mg | Freq: Two times a day (BID) | INTRAVENOUS | Status: DC
  Administered 2016-05-25 – 2016-05-26 (×2): 750 mg via INTRAVENOUS
  Filled 2016-05-25 (×3): qty 150

## 2016-05-25 MED ORDER — ONDANSETRON HCL 4 MG/2ML IJ SOLN: 4.0000 mg | Freq: Four times a day (QID) | INTRAMUSCULAR | Status: DC | PRN

## 2016-05-25 MED ORDER — INSULIN ASPART 100 UNIT/ML ~~LOC~~ SOLN
0.0000 [IU] | Freq: Three times a day (TID) | SUBCUTANEOUS | Status: DC
  Administered 2016-05-26 (×2): 7 [IU] via SUBCUTANEOUS
  Administered 2016-05-27: 3 [IU] via SUBCUTANEOUS
  Administered 2016-05-27: 11 [IU] via SUBCUTANEOUS
  Administered 2016-05-27: 3 [IU] via SUBCUTANEOUS
  Administered 2016-05-28: 7 [IU] via SUBCUTANEOUS
  Administered 2016-05-28: 11 [IU] via SUBCUTANEOUS

## 2016-05-25 MED ORDER — ISOSORBIDE MONONITRATE ER 30 MG PO TB24
30.0000 mg | ORAL_TABLET | Freq: Every day | ORAL | Status: DC
  Administered 2016-05-26 – 2016-05-28 (×3): 30 mg via ORAL
  Filled 2016-05-25 (×3): qty 1

## 2016-05-25 MED ORDER — PIPERACILLIN-TAZOBACTAM 3.375 G IVPB 30 MIN
3.3750 g | Freq: Once | INTRAVENOUS | Status: AC
  Administered 2016-05-25: 3.375 g via INTRAVENOUS
  Filled 2016-05-25: qty 50

## 2016-05-25 MED ORDER — AMLODIPINE BESYLATE 10 MG PO TABS
10.0000 mg | ORAL_TABLET | Freq: Every day | ORAL | Status: DC
  Administered 2016-05-25 – 2016-05-28 (×4): 10 mg via ORAL
  Filled 2016-05-25 (×4): qty 1

## 2016-05-25 MED ORDER — IOPAMIDOL (ISOVUE-300) INJECTION 61%
INTRAVENOUS | Status: AC
  Administered 2016-05-25: 75 mL
  Filled 2016-05-25: qty 75

## 2016-05-25 MED ORDER — SENNA 8.6 MG PO TABS
1.0000 | ORAL_TABLET | Freq: Two times a day (BID) | ORAL | Status: DC
  Administered 2016-05-25 – 2016-05-28 (×5): 8.6 mg via ORAL
  Filled 2016-05-25 (×7): qty 1

## 2016-05-25 MED ORDER — SODIUM CHLORIDE 0.9 % IV BOLUS (SEPSIS)
500.0000 mL | Freq: Once | INTRAVENOUS | Status: AC
  Administered 2016-05-25: 500 mL via INTRAVENOUS

## 2016-05-25 MED ORDER — GABAPENTIN 100 MG PO CAPS
100.0000 mg | ORAL_CAPSULE | Freq: Two times a day (BID) | ORAL | Status: DC
  Administered 2016-05-25 – 2016-05-28 (×6): 100 mg via ORAL
  Filled 2016-05-25 (×6): qty 1

## 2016-05-25 MED ORDER — PIPERACILLIN-TAZOBACTAM 3.375 G IVPB
3.3750 g | Freq: Three times a day (TID) | INTRAVENOUS | Status: DC
Start: 2016-05-25 — End: 2016-05-28
  Administered 2016-05-25 – 2016-05-28 (×8): 3.375 g via INTRAVENOUS
  Filled 2016-05-25 (×9): qty 50

## 2016-05-25 MED ORDER — ACETAMINOPHEN 325 MG PO TABS
650.0000 mg | ORAL_TABLET | Freq: Four times a day (QID) | ORAL | Status: DC | PRN
Start: 2016-05-25 — End: 2016-05-28

## 2016-05-25 MED ORDER — MORPHINE SULFATE (PF) 4 MG/ML IV SOLN
4.0000 mg | Freq: Once | INTRAVENOUS | Status: AC
  Administered 2016-05-25: 4 mg via INTRAVENOUS
  Filled 2016-05-25: qty 1

## 2016-05-25 MED ORDER — SODIUM CHLORIDE 0.9% FLUSH
3.0000 mL | Freq: Two times a day (BID) | INTRAVENOUS | Status: DC
  Administered 2016-05-25 – 2016-05-27 (×4): 3 mL via INTRAVENOUS

## 2016-05-25 NOTE — Progress Notes (Signed)
Sent note to pharmacy to send 2200 vancomycin.
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
## 2016-05-25 NOTE — ED Triage Notes (Signed)
Pt was here on 3/29 for an abscess on her buttock. Reports still having severe pain and no relief. Has been taking antibiotics as prescribed.

## 2016-05-25 NOTE — Progress Notes (Signed)
Pharmacy Antibiotic Note Heather [name] is a 66 y.o. female admitted on 05/25/2016 with buttocks abscess that failed conservative management. Pharmacy has been consulted for Zosyn and vancomycin dosing. SCr 1.25 which is slightly elevated from baseline.   Plan: 1. Vancomycin 750 IV every 12 hours.  Goal trough 15-20 mcg/mL. 2. Zosyn 3.375g IV q8h (4 hour infusion).  3. BMP in am to assess renal function     Temp (24hrs), Avg:98 F (36.7 C), Min:98 F (36.7 C), Max:98 F (36.7 C)   Recent Labs Lab 05/25/16 1448 05/25/16 1504  WBC 10.7*  --   CREATININE 1.25*  --   LATICACIDVEN  --  2.41*    Estimated Creatinine Clearance: 51.1 mL/min (A) (by C-G formula based on SCr of 1.25 mg/dL (H)).    No Known Allergies  Antimicrobials this admission: 3/31 Zosyn >>  3/31 vancomycin  >>   Microbiology results: 3/31 BCx: ngtd  Thank you for allowing pharmacy to be a part of this patient's care.  Vincenza Hews, PharmD, BCPS 05/25/2016, 3:44 PM
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
## 2016-05-25 NOTE — ED Provider Notes (Signed)
Assumed care from Dr. Laverta Baltimore at 4 PM. Briefly, the patient is a 66 y.o. female with PMHx of  has a past medical history of Diabetes mellitus and Hypertension. here with 66 yo F with PMHx of HTN, DM here with worsening gluteal redness, abscess. Labs show leukocytosis, AKI, lactic acidosis. CT scan pending. IVF, ABX given. Plan to admit.  Labs Reviewed  COMPREHENSIVE METABOLIC PANEL - Abnormal; Notable for the following:       Result Value   Chloride 97 (*)    Glucose, Bld 322 (*)    Creatinine, Ser 1.25 (*)    Total Protein 8.3 (*)    Albumin 3.3 (*)    GFR calc non Af Amer 44 (*)    GFR calc Af Amer 51 (*)    All other components within normal limits  CBC WITH DIFFERENTIAL/PLATELET - Abnormal; Notable for the following:    WBC 10.7 (*)    RBC 5.34 (*)    Neutro Abs 8.1 (*)    All other components within normal limits  URINALYSIS, ROUTINE W REFLEX MICROSCOPIC - Abnormal; Notable for the following:    Color, Urine AMBER (*)    APPearance CLOUDY (*)    Glucose, UA >=500 (*)    Bilirubin Urine SMALL (*)    Ketones, ur 5 (*)    Protein, ur 100 (*)    Bacteria, UA RARE (*)    Squamous Epithelial / LPF TOO NUMEROUS TO COUNT (*)    All other components within normal limits  I-STAT CG4 LACTIC ACID, ED - Abnormal; Notable for the following:    Lactic Acid, Venous 2.41 (*)    All other components within normal limits  CULTURE, BLOOD (ROUTINE X 2)  CULTURE, BLOOD (ROUTINE X 2)  BASIC METABOLIC PANEL    Course of Care: -CT scan with phlegmon/cellulitis right gluteal area, c/w cellulitis. No drainable abscess. Given worsening cellulitis, sepsis despite outpt abx, will admit for IVF, IV ABX.   Clinical Impression: 1. Cellulitis of right lower extremity     Disposition: Admit to Hospitalist     Duffy Bruce, MD 05/25/16 [phone]

## 2016-05-25 NOTE — ED Notes (Signed)
Report attempted 

## 2016-05-26 ENCOUNTER — Encounter (HOSPITAL_COMMUNITY)
Admission: EM | Disposition: A | Discharge status: Home / Self Care | Payer: Self-pay | Source: Home / Self Care | Attending: Internal Medicine

## 2016-05-26 ENCOUNTER — Encounter (HOSPITAL_COMMUNITY): Payer: Self-pay | Admitting: Certified Registered Nurse Anesthetist

## 2016-05-26 ENCOUNTER — Inpatient Hospital Stay (HOSPITAL_COMMUNITY): Payer: Medicare Other | Admitting: Certified Registered Nurse Anesthetist

## 2016-05-26 DIAGNOSIS — E11628 Type 2 diabetes mellitus with other skin complications: Secondary | ICD-10-CM

## 2016-05-26 HISTORY — PX: INCISION AND DRAINAGE PERIRECTAL ABSCESS: SHX1804

## 2016-05-26 LAB — GLUCOSE, CAPILLARY
Glucose-Capillary: 233 mg/dL — ABNORMAL HIGH (ref 65–99)
Glucose-Capillary: 139 mg/dL — ABNORMAL HIGH (ref 65–99)
Glucose-Capillary: 201 mg/dL — ABNORMAL HIGH (ref 65–99)
Glucose-Capillary: 168 mg/dL — ABNORMAL HIGH (ref 65–99)

## 2016-05-26 LAB — CBC WITH DIFFERENTIAL/PLATELET
BASOS ABS: 0 10*3/uL (ref 0.0–0.1)
BASOS PCT: 0 %
EOS PCT: 4 %
Eosinophils Absolute: 0.3 10*3/uL (ref 0.0–0.7)
HEMATOCRIT: 39.3 % (ref 36.0–46.0)
Hemoglobin: 12.7 g/dL (ref 12.0–15.0)
LYMPHS PCT: 24 %
Lymphs Abs: 2.4 10*3/uL (ref 0.7–4.0)
MCH: 26.6 pg (ref 26.0–34.0)
MCHC: 32.3 g/dL (ref 30.0–36.0)
MCV: 82.4 fL (ref 78.0–100.0)
MONO ABS: 0.6 10*3/uL (ref 0.1–1.0)
MONOS PCT: 6 %
NEUTROS ABS: 6.4 10*3/uL (ref 1.7–7.7)
Neutrophils Relative %: 66 %
PLATELETS: 316 10*3/uL (ref 150–400)
RBC: 4.77 MIL/uL (ref 3.87–5.11)
RDW: 13.7 % (ref 11.5–15.5)
WBC: 9.8 10*3/uL (ref 4.0–10.5)

## 2016-05-26 LAB — BASIC METABOLIC PANEL
ANION GAP: 10 (ref 5–15)
BUN: 9 mg/dL (ref 6–20)
CALCIUM: 8.6 mg/dL — AB (ref 8.9–10.3)
CO2: 22 mmol/L (ref 22–32)
Chloride: 104 mmol/L (ref 101–111)
Creatinine, Ser: 0.78 mg/dL (ref 0.44–1.00)
GFR calc non Af Amer: >60 mL/min (ref >60)
GLUCOSE: 310 mg/dL — AB (ref 65–99)
Potassium: 3.2 mmol/L — ABNORMAL LOW (ref 3.5–5.1)
Sodium: 136 mmol/L (ref 135–145)

## 2016-05-26 LAB — PROTIME-INR
INR: 1.07
PROTHROMBIN TIME: 14 s (ref 11.4–15.2)

## 2016-05-26 LAB — SURGICAL PCR SCREEN
MRSA, PCR: POSITIVE — AB
Staphylococcus aureus: POSITIVE — AB

## 2016-05-26 SURGERY — INCISION AND DRAINAGE, ABSCESS, PERIRECTAL
Anesthesia: General | Site: Buttocks

## 2016-05-26 MED ORDER — VANCOMYCIN HCL IN DEXTROSE 1-5 GM/200ML-% IV SOLN
1000.0000 mg | Freq: Two times a day (BID) | INTRAVENOUS | Status: DC
  Administered 2016-05-26 – 2016-05-28 (×4): 1000 mg via INTRAVENOUS
  Filled 2016-05-26 (×4): qty 200

## 2016-05-26 MED ORDER — PROMETHAZINE HCL 25 MG/ML IJ SOLN: 6.2500 mg | INTRAMUSCULAR | Status: DC | PRN

## 2016-05-26 MED ORDER — DOCUSATE SODIUM 100 MG PO CAPS
100.0000 mg | ORAL_CAPSULE | Freq: Two times a day (BID) | ORAL | Status: DC
  Administered 2016-05-26 – 2016-05-28 (×4): 100 mg via ORAL
  Filled 2016-05-26 (×5): qty 1

## 2016-05-26 MED ORDER — FENTANYL CITRATE (PF) 250 MCG/5ML IJ SOLN
INTRAMUSCULAR | Status: AC
Start: 2016-05-26 — End: 2016-05-26
  Filled 2016-05-26: qty 5

## 2016-05-26 MED ORDER — LIDOCAINE HCL (CARDIAC) 20 MG/ML IV SOLN
INTRAVENOUS | Status: DC | PRN
  Administered 2016-05-26: 70 mg via INTRAVENOUS

## 2016-05-26 MED ORDER — MUPIROCIN 2 % EX OINT: TOPICAL_OINTMENT | Freq: Two times a day (BID) | CUTANEOUS | Status: DC

## 2016-05-26 MED ORDER — PROPOFOL 10 MG/ML IV BOLUS
INTRAVENOUS | Status: DC | PRN
  Administered 2016-05-26: 160 mg via INTRAVENOUS

## 2016-05-26 MED ORDER — ONDANSETRON HCL 4 MG/2ML IJ SOLN
INTRAMUSCULAR | Status: DC | PRN
  Administered 2016-05-26: 4 mg via INTRAVENOUS

## 2016-05-26 MED ORDER — LACTATED RINGERS IV SOLN
INTRAVENOUS | Status: DC | PRN
  Administered 2016-05-26: 10:00:00 via INTRAVENOUS

## 2016-05-26 MED ORDER — MUPIROCIN 2 % EX OINT
1.0000 "application " | TOPICAL_OINTMENT | Freq: Two times a day (BID) | CUTANEOUS | Status: DC
  Administered 2016-05-26 – 2016-05-28 (×5): 1 via NASAL
  Filled 2016-05-26 (×2): qty 22

## 2016-05-26 MED ORDER — MIDAZOLAM HCL 2 MG/2ML IJ SOLN
INTRAMUSCULAR | Status: AC
  Filled 2016-05-26: qty 2

## 2016-05-26 MED ORDER — HYDROMORPHONE HCL 1 MG/ML IJ SOLN
INTRAMUSCULAR | Status: AC
  Administered 2016-05-26: 0.25 mg via INTRAVENOUS
  Filled 2016-05-26: qty 0.5

## 2016-05-26 MED ORDER — PNEUMOCOCCAL VAC POLYVALENT 25 MCG/0.5ML IJ INJ
0.5000 mL | INJECTION | INTRAMUSCULAR | Status: DC
Start: 2016-05-27 — End: 2016-05-28
  Filled 2016-05-26: qty 0.5

## 2016-05-26 MED ORDER — GLIPIZIDE 5 MG PO TABS
10.0000 mg | ORAL_TABLET | Freq: Two times a day (BID) | ORAL | Status: DC
  Administered 2016-05-26 – 2016-05-28 (×4): 10 mg via ORAL
  Filled 2016-05-26 (×4): qty 2

## 2016-05-26 MED ORDER — HYDROMORPHONE HCL 1 MG/ML IJ SOLN
0.2500 mg | INTRAMUSCULAR | Status: DC | PRN
  Administered 2016-05-26 (×2): 0.25 mg via INTRAVENOUS

## 2016-05-26 MED ORDER — CHLORHEXIDINE GLUCONATE CLOTH 2 % EX PADS
6.0000 | MEDICATED_PAD | Freq: Every day | CUTANEOUS | Status: DC
  Administered 2016-05-27: 6 via TOPICAL

## 2016-05-26 MED ORDER — FENTANYL CITRATE (PF) 100 MCG/2ML IJ SOLN
INTRAMUSCULAR | Status: DC | PRN
  Administered 2016-05-26: 50 ug via INTRAVENOUS
  Administered 2016-05-26: 100 ug via INTRAVENOUS

## 2016-05-26 MED ORDER — CHLORHEXIDINE GLUCONATE CLOTH 2 % EX PADS: 6.0000 | MEDICATED_PAD | Freq: Two times a day (BID) | CUTANEOUS | Status: DC

## 2016-05-26 MED ORDER — MIDAZOLAM HCL 5 MG/5ML IJ SOLN
INTRAMUSCULAR | Status: DC | PRN
  Administered 2016-05-26: 2 mg via INTRAVENOUS

## 2016-05-26 MED ORDER — 0.9 % SODIUM CHLORIDE (POUR BTL) OPTIME
TOPICAL | Status: DC | PRN
Start: 2016-05-26 — End: 2016-05-26
  Administered 2016-05-26: 1000 mL

## 2016-05-26 MED ORDER — POTASSIUM CHLORIDE CRYS ER 20 MEQ PO TBCR
40.0000 meq | EXTENDED_RELEASE_TABLET | Freq: Once | ORAL | Status: AC
  Administered 2016-05-26: 40 meq via ORAL
  Filled 2016-05-26: qty 2

## 2016-05-26 MED ORDER — PHENYLEPHRINE HCL 10 MG/ML IJ SOLN
INTRAVENOUS | Status: DC | PRN
  Administered 2016-05-26: 25 ug/min via INTRAVENOUS

## 2016-05-26 MED ORDER — PROPOFOL 10 MG/ML IV BOLUS
INTRAVENOUS | Status: AC
  Filled 2016-05-26: qty 20

## 2016-05-26 MED ORDER — OXYCODONE HCL 5 MG PO TABS: 5.0000 mg | ORAL_TABLET | ORAL | Status: DC | PRN

## 2016-05-26 MED ORDER — SCOPOLAMINE 1 MG/3DAYS TD PT72
MEDICATED_PATCH | TRANSDERMAL | Status: DC | PRN
  Administered 2016-05-26: 1 via TRANSDERMAL

## 2016-05-26 SURGICAL SUPPLY — 32 items
BLADE SURG 15 STRL LF DISP TIS (BLADE) ×1 IMPLANT
BLADE SURG 15 STRL SS (BLADE)
BNDG GAUZE ELAST 4 BULKY (GAUZE/BANDAGES/DRESSINGS) ×1 IMPLANT
BRIEF STRETCH FOR OB PAD LRG (UNDERPADS AND DIAPERS) ×2 IMPLANT
CANISTER SUCT 3000ML PPV (MISCELLANEOUS) ×2 IMPLANT
COVER MAYO STAND STRL (DRAPES) ×1 IMPLANT
COVER SURGICAL LIGHT HANDLE (MISCELLANEOUS) ×2 IMPLANT
DRSG PAD ABDOMINAL 8X10 ST (GAUZE/BANDAGES/DRESSINGS) ×2 IMPLANT
ELECT CAUTERY BLADE 6.4 (BLADE) ×2 IMPLANT
GAUZE SPONGE 4X4 12PLY STRL (GAUZE/BANDAGES/DRESSINGS) ×2 IMPLANT
GLOVE BIO SURGEON STRL SZ 6 (GLOVE) ×2 IMPLANT
GLOVE BIOGEL PI IND STRL 6.5 (GLOVE) ×1 IMPLANT
GLOVE BIOGEL PI INDICATOR 6.5 (GLOVE) ×1
GOWN STRL REUS W/ TWL LRG LVL3 (GOWN DISPOSABLE) ×2 IMPLANT
GOWN STRL REUS W/TWL LRG LVL3 (GOWN DISPOSABLE) ×4
KIT BASIN OR (CUSTOM PROCEDURE TRAY) ×2 IMPLANT
KIT ROOM TURNOVER OR (KITS) ×2 IMPLANT
LEGGING LITHOTOMY PAIR STRL (DRAPES) ×1 IMPLANT
NDL HYPO 25GX1X1/2 BEV (NEEDLE) ×1 IMPLANT
NEEDLE HYPO 25GX1X1/2 BEV (NEEDLE) IMPLANT
NS IRRIG 1000ML POUR BTL (IV SOLUTION) ×2 IMPLANT
PACK GENERAL/GYN (CUSTOM PROCEDURE TRAY) ×1 IMPLANT
PACK LITHOTOMY IV (CUSTOM PROCEDURE TRAY) ×1 IMPLANT
PAD ARMBOARD 7.5X6 YLW CONV (MISCELLANEOUS) ×2 IMPLANT
PENCIL BUTTON HOLSTER BLD 10FT (ELECTRODE) ×1 IMPLANT
SPONGE LAP 4X18 X RAY DECT (DISPOSABLE) ×1 IMPLANT
SURGILUBE 2OZ TUBE FLIPTOP (MISCELLANEOUS) ×1 IMPLANT
SUT CHROMIC 3 0 SH 27 (SUTURE) ×1 IMPLANT
SYR CONTROL 10ML LL (SYRINGE) ×1 IMPLANT
TOWEL OR 17X26 10 PK STRL BLUE (TOWEL DISPOSABLE) ×2 IMPLANT
TUBE CONNECTING 12X1/4 (SUCTIONS) ×1 IMPLANT
YANKAUER SUCT BULB TIP NO VENT (SUCTIONS) ×1 IMPLANT

## 2016-05-26 NOTE — Transfer of Care (Signed)
Immediate Anesthesia Transfer of Care Note  Patient: Heather [name]  Procedure(s) Performed: Procedure(s): IRRIGATION AND DEBRIDEMENT PERIRECTAL ABSCESS (N/A)  Patient Location: PACU  Anesthesia Type:General  Level of Consciousness: awake, alert , oriented and patient cooperative  Airway & Oxygen Therapy: Patient Spontanous Breathing and Patient connected to nasal cannula oxygen  Post-op Assessment: Report given to RN and Post -op Vital signs reviewed and stable  Post vital signs: Reviewed and stable  Last Vitals:  Vitals:   05/25/16 2024 05/26/16 0511  BP: (!) 167/95 139/73  Pulse: 78 66  Resp: 18 18  Temp: 36.7 C     Last Pain:  Vitals:   05/26/16 0511  TempSrc: Oral  PainSc:          Complications: No apparent anesthesia complications

## 2016-05-26 NOTE — Progress Notes (Signed)
Day of Surgery  Subjective: No complaints this morning  Objective: Vital signs in last 24 hours: Temp:  [98 F (36.7 C)-98.1 F (36.7 C)] 98.1 F (36.7 C) (03/31 2024) Pulse Rate:  [66-101] 66 (04/01 0511) Resp:  [8-22] 18 (04/01 0511) BP: (117-167)/(73-95) 139/73 (04/01 0511) SpO2:  [96 %-100 %] 97 % (04/01 0511) Weight:  [86.3 kg (190 lb 4.1 oz)] 86.3 kg (190 lb 4.1 oz) (03/31 2020)    Intake/Output from previous day: 03/31 0701 - 04/01 0700 In: 750 [IV Piggyback:750] Out: 400 [Urine:400] Intake/Output this shift: No intake/output data recorded.  General appearance: alert and cooperative GI: soft, non-tender; bowel sounds normal; no masses,  no organomegaly Skin: 5cm abscess on the right gluteus with central opening  Lab Results:   Recent Labs  05/25/16 1448 05/26/16 0328  WBC 10.7* 9.8  HGB 14.4 12.7  HCT 44.0 39.3  PLT 385 316   BMET  Recent Labs  05/25/16 1448 05/26/16 0328  NA 135 136  K 3.6 3.2*  CL 97* 104  CO2 27 22  GLUCOSE 322* 310*  BUN 15 9  CREATININE 1.25* 0.78  CALCIUM 9.7 8.6*   PT/INR  Recent Labs  05/26/16 0328  LABPROT 14.0  INR 1.07   ABG No results for input(s): PHART, HCO3 in the last 72 hours.  Invalid input(s): PCO2, PO2  Studies/Results: Ct Abdomen Pelvis W Contrast  Result Date: 05/25/2016 CLINICAL DATA:  Right gluteal abscess status post drainage 2 days prior EXAM: CT ABDOMEN AND PELVIS WITH CONTRAST TECHNIQUE: Multidetector CT imaging of the abdomen and pelvis was performed using the standard protocol following bolus administration of intravenous contrast. CONTRAST:  50mL ISOVUE-300 IOPAMIDOL (ISOVUE-300) INJECTION 61% COMPARISON:  No prior CT abdomen/ pelvis. Chest CT dated 01/01/2016. FINDINGS: Lower chest: No significant pulmonary nodules or acute consolidative airspace disease. Mild scarring versus atelectasis at the left lung base. Stable nonspecific small air cysts in the right middle lobe and medial left lower  lobe. Stable nonspecific mild circumferential wall thickening in the lower thoracic esophagus near the esophagogastric junction. Hepatobiliary: Normal liver with no liver mass. Normal gallbladder with no radiopaque cholelithiasis. No biliary ductal dilatation. Pancreas: Hypodense 0.6 cm pancreatic neck lesion (series 3/ image 29). Otherwise unremarkable pancreas, with no main pancreatic duct dilation. Spleen: Normal size. No mass. Adrenals/Urinary Tract: Normal adrenals. No hydronephrosis. Hypodense 1.1 cm renal cortical lesion in the lateral interpolar right kidney (series 8/ image 12). No additional renal lesions. Normal bladder. Stomach/Bowel: Grossly normal stomach. Normal caliber small bowel with no small bowel wall thickening. Normal appendix. Normal large bowel with no diverticulosis, large bowel wall thickening or pericolonic fat stranding. Vascular/Lymphatic: Atherosclerotic nonaneurysmal abdominal aorta. Patent portal, splenic, hepatic and renal veins. No pathologically enlarged lymph nodes in the abdomen or pelvis. Reproductive: Grossly normal uterus.  No adnexal mass. Other: No pneumoperitoneum, ascites or focal fluid collection. There is a large region of extensive fat stranding, ill-defined fluid and scattered gas in the subcutaneous soft tissues of the medial gluteal region (series 3/ image 95), compatible with phlegmonous change, with no discrete measurable/drainable fluid collection. No appreciable perianal fistula. Musculoskeletal: No aggressive appearing focal osseous lesions. Mild thoracolumbar spondylosis. IMPRESSION: 1. Phlegmonous change in the medial right gluteal subcutaneous soft tissues, without discrete measurable/drainable fluid collection. No appreciable perianal fistula by CT. 2. **An incidental finding of potential clinical significance has been found. Subcentimeter hypodense pancreatic neck lesion without pancreatic duct dilation. Further characterization with outpatient MRI abdomen  without and with IV contrast  is recommended. ** 3. **An incidental finding of potential clinical significance has been found. Indeterminate low-attenuation 1.1 cm renal cortical lesion in the lateral interpolar right kidney, which can also be further characterized on an outpatient MRI abdomen without and with IV contrast. ** 4. Nonspecific mild circumferential wall thickening in the lower thoracic esophagus near the esophagogastric junction, stable since 01/01/2016 chest CT angiogram study. Further evaluation, including any decision to pursue upper endoscopy, should be based on clinical assessment. 5. Aortic atherosclerosis. Electronically Signed   By: Ilona Sorrel M.D.   On: 05/25/2016 18:03    Anti-infectives: Anti-infectives    Start     Dose/Rate Route Frequency Ordered Stop   05/25/16 2200  piperacillin-tazobactam (ZOSYN) IVPB 3.375 g     3.375 g 12.5 mL/hr over 240 Minutes Intravenous Every 8 hours 05/25/16 1540     05/25/16 2100  vancomycin (VANCOCIN) IVPB 750 mg/150 ml premix     750 mg 150 mL/hr over 60 Minutes Intravenous Every 12 hours 05/25/16 1542     05/25/16 1530  piperacillin-tazobactam (ZOSYN) IVPB 3.375 g     3.375 g 100 mL/hr over 30 Minutes Intravenous  Once 05/25/16 1528 05/25/16 1656   05/25/16 1530  vancomycin (VANCOCIN) IVPB 1000 mg/200 mL premix     1,000 mg 200 mL/hr over 60 Minutes Intravenous  Once 05/25/16 1528 05/25/16 1940      Assessment/Plan: s/p Procedure(s): IRRIGATION AND DEBRIDEMENT PERIRECTAL ABSCESS (N/A) To OR today for I&D. We discussed the plan and her questions were answered.   LOS: 1 day    Clovis Riley 05/26/2016

## 2016-05-26 NOTE — Anesthesia Postprocedure Evaluation (Addendum)
Anesthesia Post Note  Patient: Heather [name]  Procedure(s) Performed: Procedure(s) (LRB): IRRIGATION AND DEBRIDEMENT PERIRECTAL ABSCESS (N/A)  Patient location during evaluation: PACU Anesthesia Type: General Level of consciousness: sedated Pain management: pain level controlled Vital Signs Assessment: post-procedure vital signs reviewed and stable Respiratory status: spontaneous breathing and respiratory function stable Cardiovascular status: stable Anesthetic complications: no       Last Vitals:  Vitals:   05/26/16 1208 05/26/16 1215  BP:    Pulse: 64 (!) 55  Resp: 18 16  Temp:  36.2 C    Last Pain:  Vitals:   05/26/16 1215  TempSrc:   PainSc: [place]

## 2016-05-26 NOTE — Progress Notes (Signed)
PROGRESS NOTE    Heather [name]  HTD:[id number] DOB: [birth year] DOA: 05/25/2016 PCP: Elyn Peers, MD   Outpatient Specialists:    Brief Narrative:  Heather [name] is a 66 y.o. 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  She had I and D in the emergency department.  There were no overt signs of sepsis at that time.  She was discharged to home with prescriptions for Bactrim and Keflex as well as ibuprofen and MS IR for pain control.  She was advised to come back for repeat evaluation in 48 hours, sooner clinical status deteriorated.  Tonight she is back and reports increased pain, 10 out of 10 at home, in her right gluteal region.  She is unable to sit down due to pain.    Assessment & Plan:   Principal Problem:   Sepsis ([place]) Active Problems:   HTN (hypertension)   Diabetes ([place])   Abscess, gluteal, right   Mild sepsis secondary to right gluteal abscess in high risk patient (history of uncontrolled Type 2 Diabetes).  Failed outpatient management. -s/p I&D by general surgery-- appreciate care --Continue vanc and zosyn for now; narrow when cultures return --Blood cultures ordered in the ED  Mild AKI in the setting of decreased PO intake, N/V, and sepsis --HOLD lisinopril/HCTZ for now --Hydrate --Resolved  HTN --Amlodipine, Imdur.  DM --resume glipizide now that not NPO --Aggressive SSI AC/HS -needs close monitoring  Diabetic neuropathy --Neurontin  History of mild CAD --Baby aspirin  Incidental findings on CT of 0.6cm pancreatic neck lesion and 1.1cm right renal cortex lesion.  She will need MRI of the abdomen with and without contrast at some for further evaluation of both lesions. --Patient has been advised that she will need follow-up imaging at some point  through PCP   DVT prophylaxis:  SCD's  Code Status: Full  Code  Family Communication:   Disposition Plan:     Consultants:   General surgery    Subjective: In pain-- just back from procedure  Objective: Vitals:   05/26/16 1208 05/26/16 1215 05/26/16 1219 05/26/16 1239  BP:  137/83 137/83 (!) 143/70  Pulse: 64 (!) 55 (!) 55 (!) 53  Resp: 18 16 14 18   Temp:  97.2 F (36.2 C)  97.7 F (36.5 C)  TempSrc:    Oral  SpO2: 100% 100% 100% 96%  Weight:      Height:        Intake/Output Summary (Last 24 hours) at 05/26/16 1314 Last data filed at 05/26/16 1119  Gross per 24 hour  Intake             1250 ml  Output              440 ml  Net              810 ml   Filed Weights   05/25/16 2020  Weight: 86.3 kg (190 lb 4.1 oz)    Examination:  General exam: resting Respiratory system: Clear to auscultation. Respiratory effort normal. Cardiovascular system: S1 & S2 heard, RRR. No JVD, murmurs, rubs, gallops or clicks. No pedal edema. Gastrointestinal system: Abdomen is obese, soft and nontender. No organomegaly or masses felt. Normal bowel sounds heard. Central nervous system: Alert and oriented. No focal neurological deficits. Extremities: Symmetric 5 x 5 power. Skin: wound covered  Data Reviewed:   CBC:  Recent Labs Lab 05/25/16 1448 05/26/16 0328  WBC 10.7* 9.8  NEUTROABS 8.1* 6.4  HGB 14.4 12.7  HCT 44.0 39.3  MCV 82.4 82.4  PLT 385 376   Basic Metabolic Panel:  Recent Labs Lab 05/25/16 1448 05/26/16 0328  NA 135 136  K 3.6 3.2*  CL 97* 104  CO2 27 22  GLUCOSE 322* 310*  BUN 15 9  CREATININE 1.25* 0.78  CALCIUM 9.7 8.6*   GFR: Estimated Creatinine Clearance: 79.1 mL/min (by C-G formula based on SCr of 0.78 mg/dL). Liver Function Tests:  Recent Labs Lab 05/25/16 1448  AST 18  ALT 14  ALKPHOS 118  BILITOT 0.6  PROT 8.3*  ALBUMIN 3.3*   No results for input(s): LIPASE, AMYLASE in the last 168 hours. No results for input(s): AMMONIA in the last 168 hours. Coagulation Profile:  Recent  Labs Lab 05/26/16 0328  INR 1.07   Cardiac Enzymes: No results for input(s): CKTOTAL, CKMB, CKMBINDEX, TROPONINI in the last 168 hours. BNP (last 3 results) No results for input(s): PROBNP in the last 8760 hours. HbA1C: No results for input(s): HGBA1C in the last 72 hours. CBG:  Recent Labs Lab 05/25/16 2120 05/26/16 0825  GLUCAP 206* 233*   Lipid Profile: No results for input(s): CHOL, HDL, LDLCALC, TRIG, CHOLHDL, LDLDIRECT in the last 72 hours. Thyroid Function Tests: No results for input(s): TSH, T4TOTAL, FREET4, T3FREE, THYROIDAB in the last 72 hours. Anemia Panel: No results for input(s): VITAMINB12, FOLATE, FERRITIN, TIBC, IRON, RETICCTPCT in the last 72 hours. Urine analysis:    Component Value Date/Time   COLORURINE AMBER (A) 05/25/2016 1454   APPEARANCEUR CLOUDY (A) 05/25/2016 1454   LABSPEC 1.025 05/25/2016 1454   PHURINE 5.0 05/25/2016 1454   GLUCOSEU >=500 (A) 05/25/2016 1454   HGBUR NEGATIVE 05/25/2016 1454   BILIRUBINUR SMALL (A) 05/25/2016 1454   KETONESUR 5 (A) 05/25/2016 1454   PROTEINUR 100 (A) 05/25/2016 1454   NITRITE NEGATIVE 05/25/2016 1454   LEUKOCYTESUR NEGATIVE 05/25/2016 1454     ) Recent Results (from the past 240 hour(s))  Culture, blood (routine x 2)     Status: None (Preliminary result)   Collection Time: 05/25/16  2:42 PM  Result Value Ref Range Status   Specimen Description BLOOD RIGHT ANTECUBITAL  Final   Special Requests BOTTLES DRAWN AEROBIC AND ANAEROBIC 5CC  Final   Culture NO GROWTH < 24 HOURS  Final   Report Status PENDING  Incomplete  Culture, blood (routine x 2)     Status: None (Preliminary result)   Collection Time: 05/25/16  2:50 PM  Result Value Ref Range Status   Specimen Description BLOOD LEFT HAND  Final   Special Requests IN PEDIATRIC BOTTLE 4CC  Final   Culture NO GROWTH < 24 HOURS  Final   Report Status PENDING  Incomplete  Surgical pcr screen     Status: Abnormal   Collection Time: 05/26/16  9:29 AM   Result Value Ref Range Status   MRSA, PCR POSITIVE (A) NEGATIVE Final   Staphylococcus aureus POSITIVE (A) NEGATIVE Final    Comment:        The Xpert SA Assay (FDA approved for NASAL specimens in patients over 67 years of age), is one component of a comprehensive surveillance program.  Test performance has been validated by [facility] for patients greater than or equal to 31 year old. It is not intended to diagnose infection nor to guide or monitor treatment. RESULT  CALLED TO, READ BACK BY AND VERIFIED WITH: G SCALCO,RN AT 1150 05/26/16 BY L BENFIELD       Anti-infectives    Start     Dose/Rate Route Frequency Ordered Stop   05/26/16 2100  vancomycin (VANCOCIN) IVPB 1000 mg/200 mL premix     1,000 mg 200 mL/hr over 60 Minutes Intravenous Every 12 hours 05/26/16 1248     05/25/16 2200  piperacillin-tazobactam (ZOSYN) IVPB 3.375 g     3.375 g 12.5 mL/hr over 240 Minutes Intravenous Every 8 hours 05/25/16 1540     05/25/16 2100  vancomycin (VANCOCIN) IVPB 750 mg/150 ml premix  Status:  Discontinued     750 mg 150 mL/hr over 60 Minutes Intravenous Every 12 hours 05/25/16 1542 05/26/16 1248   05/25/16 1530  piperacillin-tazobactam (ZOSYN) IVPB 3.375 g     3.375 g 100 mL/hr over 30 Minutes Intravenous  Once 05/25/16 1528 05/25/16 1656   05/25/16 1530  vancomycin (VANCOCIN) IVPB 1000 mg/200 mL premix     1,000 mg 200 mL/hr over 60 Minutes Intravenous  Once 05/25/16 1528 05/25/16 1940       Radiology Studies: Ct Abdomen Pelvis W Contrast  Result Date: 05/25/2016 CLINICAL DATA:  Right gluteal abscess status post drainage 2 days prior EXAM: CT ABDOMEN AND PELVIS WITH CONTRAST TECHNIQUE: Multidetector CT imaging of the abdomen and pelvis was performed using the standard protocol following bolus administration of intravenous contrast. CONTRAST:  11mL ISOVUE-300 IOPAMIDOL (ISOVUE-300) INJECTION 61% COMPARISON:  No prior CT abdomen/ pelvis. Chest CT dated 01/01/2016. FINDINGS: Lower  chest: No significant pulmonary nodules or acute consolidative airspace disease. Mild scarring versus atelectasis at the left lung base. Stable nonspecific small air cysts in the right middle lobe and medial left lower lobe. Stable nonspecific mild circumferential wall thickening in the lower thoracic esophagus near the esophagogastric junction. Hepatobiliary: Normal liver with no liver mass. Normal gallbladder with no radiopaque cholelithiasis. No biliary ductal dilatation. Pancreas: Hypodense 0.6 cm pancreatic neck lesion (series 3/ image 29). Otherwise unremarkable pancreas, with no main pancreatic duct dilation. Spleen: Normal size. No mass. Adrenals/Urinary Tract: Normal adrenals. No hydronephrosis. Hypodense 1.1 cm renal cortical lesion in the lateral interpolar right kidney (series 8/ image 12). No additional renal lesions. Normal bladder. Stomach/Bowel: Grossly normal stomach. Normal caliber small bowel with no small bowel wall thickening. Normal appendix. Normal large bowel with no diverticulosis, large bowel wall thickening or pericolonic fat stranding. Vascular/Lymphatic: Atherosclerotic nonaneurysmal abdominal aorta. Patent portal, splenic, hepatic and renal veins. No pathologically enlarged lymph nodes in the abdomen or pelvis. Reproductive: Grossly normal uterus.  No adnexal mass. Other: No pneumoperitoneum, ascites or focal fluid collection. There is a large region of extensive fat stranding, ill-defined fluid and scattered gas in the subcutaneous soft tissues of the medial gluteal region (series 3/ image 95), compatible with phlegmonous change, with no discrete measurable/drainable fluid collection. No appreciable perianal fistula. Musculoskeletal: No aggressive appearing focal osseous lesions. Mild thoracolumbar spondylosis. IMPRESSION: 1. Phlegmonous change in the medial right gluteal subcutaneous soft tissues, without discrete measurable/drainable fluid collection. No appreciable perianal fistula  by CT. 2. **An incidental finding of potential clinical significance has been found. Subcentimeter hypodense pancreatic neck lesion without pancreatic duct dilation. Further characterization with outpatient MRI abdomen without and with IV contrast is recommended. ** 3. **An incidental finding of potential clinical significance has been found. Indeterminate low-attenuation 1.1 cm renal cortical lesion in the lateral interpolar right kidney, which can also be further characterized on an outpatient MRI abdomen  without and with IV contrast. ** 4. Nonspecific mild circumferential wall thickening in the lower thoracic esophagus near the esophagogastric junction, stable since 01/01/2016 chest CT angiogram study. Further evaluation, including any decision to pursue upper endoscopy, should be based on clinical assessment. 5. Aortic atherosclerosis. Electronically Signed   By: Ilona Sorrel M.D.   On: 05/25/2016 18:03        Scheduled Meds: . amLODipine  10 mg Oral Daily  . aspirin  81 mg Oral Daily  . Chlorhexidine Gluconate Cloth  6 each Topical Q0600  . docusate sodium  100 mg Oral BID  . gabapentin  100 mg Oral BID  . glipiZIDE  10 mg Oral BID AC  . insulin aspart  0-20 Units Subcutaneous TID WC  . insulin aspart  0-5 Units Subcutaneous QHS  . isosorbide mononitrate  30 mg Oral Daily  . mupirocin ointment  1 application Nasal BID  . piperacillin-tazobactam (ZOSYN)  IV  3.375 g Intravenous Q8H  . [START ON 05/27/2016] pneumococcal 23 valent vaccine  0.5 mL Intramuscular Tomorrow-1000  . senna  1 tablet Oral BID  . sodium chloride flush  3 mL Intravenous Q12H  . vancomycin  1,000 mg Intravenous Q12H   Continuous Infusions:   LOS: 1 day    Time spent: 35 min    [place], DO [facility] Pager [phone]  If 7PM-7AM, please contact night-coverage [email or web address] Password [email or web address] 05/26/2016, 1:14 PM

## 2016-05-26 NOTE — Progress Notes (Signed)
Pharmacy Antibiotic Note Heather [name] is a 66 y.o. female admitted on 05/25/2016 with perirectal abscess s/p I&D on 4/1. Currently on day 2 of Zosyn and vancomycin for treatment. SCr improved overnight.   Plan: 1. Continue Zosyn 3.375 grams IV every 8 hours (infused over 4 hours) 2. Increase vancomycin to 1000 mg IV every 12 hours  3. Follow up culture data and narrow as feasible   Height: 5\' 7"  (170.2 cm) Weight: 190 lb 4.1 oz (86.3 kg) IBW/kg (Calculated) : 61.6  Temp (24hrs), Avg:97.6 F (36.4 C), Min:97.2 F (36.2 C), Max:98.1 F (36.7 C)   Recent Labs Lab 05/25/16 1448 05/25/16 1504 05/25/16 1953 05/25/16 2143 05/26/16 0328  WBC 10.7*  --   --   --  9.8  CREATININE 1.25*  --   --   --  0.78  LATICACIDVEN  --  2.41* 1.4 1.1  --     Estimated Creatinine Clearance: 79.1 mL/min (by C-G formula based on SCr of 0.78 mg/dL).    No Known Allergies  Antimicrobials this admission: 3/31 Zosyn >> 3/31 Vancomycin >>   Microbiology results: 4/1 Op cx: px  4/1 MRSA PCR: positive  3/31 BCx: ngtd 3/31 UCx: px  Thank you for allowing pharmacy to be a part of this patient's care.  Vincenza Hews, PharmD, BCPS 05/26/2016, 12:50 PM

## 2016-05-26 NOTE — Op Note (Signed)
Operative Note  [facility]  [id number]  [id number]  05/26/2016   Surgeon: Clovis Riley  Assistant: OR staff  Procedure performed: incision and debridement perirectal abscess, final wound 3x2cm x 4cm deep  Preop diagnosis: right gluteal/perirectal abscess Post-op diagnosis/intraop findings: same  Specimens: cultures Retained items: kerlix packing to be changed in 24 hours EBL: [id number] Complications: none  Description of procedure: After obtaining informed consent the patient was taken to the operating room and placed supine on operating room table wheregeneral endotracheal anesthesia was initiated, preoperative antibiotics were administered, SCDs applied, and a formal timeout was performed. She was placed in lithotomy and all pressure points padded appropriately. The perineum was prepped and draped in the usual sterile fashion. The prior incision and surrounding skin overlying the previously drained abscess were excised with cautery and the cavity bluntly probed to break up loculations. Purulent fluid was evacuated and the wound was cultured. Once all purulence and loculations had been addressed another small wedge of skin was excised to make the cavity easy to pack. The space tracked about 4cm deep/anterior at its furthest extent. The wound opening at the skin is 3x2cm. Hemostasis was ensured in the wound. It was irrigated and packed with a saline moistened kerlix. Dry dressing and mesh panties were applied. The patient was then awakened, extubated and taken to PACU in stable condition.   All counts were correct at the completion of the case.

## 2016-05-26 NOTE — Anesthesia Procedure Notes (Signed)
Procedure Name: LMA Insertion Date/Time: 05/26/2016 10:46 AM Performed by: Oletta Lamas Pre-anesthesia Checklist: Patient identified, Emergency Drugs available, Suction available and Patient being monitored Patient Re-evaluated:Patient Re-evaluated prior to inductionOxygen Delivery Method: Circle System Utilized Preoxygenation: Pre-oxygenation with 100% oxygen Intubation Type: IV induction Ventilation: Mask ventilation without difficulty LMA: LMA inserted LMA Size: 4.0 Number of attempts: 1 Placement Confirmation: positive ETCO2 Tube secured with: Tape Dental Injury: Teeth and Oropharynx as per pre-operative assessment

## 2016-05-26 NOTE — Progress Notes (Signed)
Patient arrived to floor via stretcher, ambulated to bed.  Alert and oriented, in no apparent distress.  Armband verified patient. Placed on tele with second verifier.  Patient has a gluteal fold abscess which is being treated with antibiotics. Safety maintained.  Will continue to monitor. Safety maintained.

## 2016-05-26 NOTE — Progress Notes (Addendum)
Patient is going to OR; report given to nurse in Short Stay. No acute distress at this time, no complaints.

## 2016-05-26 NOTE — Anesthesia Preprocedure Evaluation (Addendum)
Anesthesia Evaluation  Patient identified by MRN, date of birth, ID band Patient awake    Reviewed: Allergy & Precautions, H&P , NPO status , Patient's Chart, lab work & pertinent test results  History of Anesthesia Complications Negative for: history of anesthetic complications  Airway Mallampati: II  TM Distance: >3 FB Neck ROM: Full    Dental no notable dental hx. (+) Poor Dentition, Dental Advisory Given, Missing   Pulmonary neg pulmonary ROS,    Pulmonary exam normal breath sounds clear to auscultation       Cardiovascular hypertension, Pt. on medications + CAD   Rhythm:Regular Rate:Normal  Study Conclusions  - Left ventricle: The cavity size was normal. Systolic function was   normal. The estimated ejection fraction was in the range of 60%   to 65%. Wall motion was normal; there were no regional wall   motion abnormalities. Doppler parameters are consistent with   abnormal left ventricular relaxation (grade 1 diastolic   dysfunction). - Mitral valve: There was mild regurgitation. - Pulmonary arteries: Systolic pressure was mildly increased. PA   peak pressure: 35 mm Hg (S).   Neuro/Psych negative neurological ROS  negative psych ROS   GI/Hepatic negative GI ROS, Neg liver ROS,   Endo/Other  diabetes, Type 2, Oral Hypoglycemic Agents  Renal/GU negative Renal ROS  negative genitourinary   Musculoskeletal   Abdominal   Peds  Hematology negative hematology ROS (+)   Anesthesia Other Findings   Reproductive/Obstetrics negative OB ROS                            Anesthesia Physical  Anesthesia Plan  ASA: III  Anesthesia Plan: General   Post-op Pain Management:    Induction: Intravenous  Airway Management Planned: Oral ETT and LMA  Additional Equipment:   Intra-op Plan:   Post-operative Plan: Extubation in OR  Informed Consent: I have reviewed the patients History and  Physical, chart, labs and discussed the procedure including the risks, benefits and alternatives for the proposed anesthesia with the patient or authorized representative who has indicated his/her understanding and acceptance.   Dental advisory given  Plan Discussed with: CRNA and Anesthesiologist  Anesthesia Plan Comments:        Anesthesia Quick Evaluation

## 2016-05-26 NOTE — Progress Notes (Signed)
Patient back from OR. Alert and oriented, complaining of pain on right gluteal (8/10). ABD dressing on place, clean, dry no drainage. Will continue to monitor.

## 2016-05-27 ENCOUNTER — Encounter (HOSPITAL_COMMUNITY): Payer: Self-pay | Admitting: Surgery

## 2016-05-27 LAB — BASIC METABOLIC PANEL
ANION GAP: 10 (ref 5–15)
BUN: 10 mg/dL (ref 6–20)
CALCIUM: 8.9 mg/dL (ref 8.9–10.3)
CHLORIDE: 105 mmol/L (ref 101–111)
CO2: 25 mmol/L (ref 22–32)
Creatinine, Ser: 0.98 mg/dL (ref 0.44–1.00)
GFR calc Af Amer: >60 mL/min (ref >60)
GFR calc non Af Amer: 59 mL/min — ABNORMAL LOW (ref >60)
GLUCOSE: 117 mg/dL — AB (ref 65–99)
POTASSIUM: 3.4 mmol/L — AB (ref 3.5–5.1)
Sodium: 140 mmol/L (ref 135–145)

## 2016-05-27 LAB — CBC
HEMATOCRIT: 36.4 % (ref 36.0–46.0)
HEMOGLOBIN: 11.7 g/dL — AB (ref 12.0–15.0)
MCH: 26.5 pg (ref 26.0–34.0)
MCHC: 32.1 g/dL (ref 30.0–36.0)
MCV: 82.4 fL (ref 78.0–100.0)
Platelets: 322 10*3/uL (ref 150–400)
RBC: 4.42 MIL/uL (ref 3.87–5.11)
RDW: 13.9 % (ref 11.5–15.5)
WBC: 9 10*3/uL (ref 4.0–10.5)

## 2016-05-27 LAB — URINE CULTURE

## 2016-05-27 LAB — GLUCOSE, CAPILLARY
GLUCOSE-CAPILLARY: 197 mg/dL — AB (ref 65–99)
Glucose-Capillary: 136 mg/dL — ABNORMAL HIGH (ref 65–99)
Glucose-Capillary: 254 mg/dL — ABNORMAL HIGH (ref 65–99)
Glucose-Capillary: 128 mg/dL — ABNORMAL HIGH (ref 65–99)

## 2016-05-27 MED ORDER — GUAIFENESIN ER 600 MG PO TB12
600.0000 mg | ORAL_TABLET | Freq: Two times a day (BID) | ORAL | Status: DC
  Administered 2016-05-27 – 2016-05-28 (×2): 600 mg via ORAL
  Filled 2016-05-27 (×2): qty 1

## 2016-05-27 MED ORDER — POTASSIUM CHLORIDE CRYS ER 20 MEQ PO TBCR
40.0000 meq | EXTENDED_RELEASE_TABLET | Freq: Once | ORAL | Status: AC
  Administered 2016-05-27: 40 meq via ORAL
  Filled 2016-05-27: qty 2

## 2016-05-27 NOTE — Progress Notes (Signed)
Inpatient Diabetes Program Recommendations  AACE/ADA: New Consensus Statement on Inpatient Glycemic Control (2015)  Target Ranges:  Prepandial:   less than 140 mg/dL      Peak postprandial:   less than 180 mg/dL (1-2 hours)      Critically ill patients:  140 - 180 mg/dL   Results for THETA, LEAF (MRN [id number]) as of 05/27/2016 14:00  Ref. Range 05/26/2016 08:25 05/26/2016 13:07 05/26/2016 17:24 05/26/2016 22:57 05/27/2016 08:02 05/27/2016 12:00  Glucose-Capillary Latest Ref Range: 65 - 99 mg/dL 233 (H) 139 (H) 201 (H) 168 (H) 136 (H) 254 (H)   Review of Glycemic Control  Diabetes history: DM2 Outpatient Diabetes medications: Glipizide 10 mg BID Current orders for Inpatient glycemic control: Novolog 0-20 units TID with meals, Novolog 0-5 units QHS, Glipizide 10 mg BID  Inpatient Diabetes Program Recommendations: Insulin - Meal Coverage: If post prandial glucose continues to be elevated, please consider ordering Novolog 3 units TID with meals for meal coverage if patient eats at least 50% of meals. HgbA1C: Last A1C was 10.3% on 01/01/16. Please consider ordering an A1C to evaluate glycemic control over the past 2-3 months.  Thanks, Barnie Alderman, RN, MSN, CDE Diabetes Coordinator Inpatient Diabetes Program [phone] (Team Pager from 8am to 5pm)

## 2016-05-27 NOTE — Discharge Instructions (Signed)
How to Take a Sitz Bath A sitz bath is a warm water bath that is taken while you are sitting down. The water should only come up to your hips and should cover your buttocks. Your health care provider may recommend a sitz bath to help you:  Clean the lower part of your body, including your genital area.  With itching.  With pain.  With sore muscles or muscles that tighten or spasm. How to take a sitz bath Take 3-4 sitz baths per day or as told by your health care provider. 1. Partially fill a bathtub with warm water. You will only need the water to be deep enough to cover your hips and buttocks when you are sitting in it. 2. If your health care provider told you to put medicine in the water, follow the directions exactly. 3. Sit in the water and open the tub drain a little. 4. Turn on the warm water again to keep the tub at the correct level. Keep the water running constantly. 5. Soak in the water for 15-20 minutes or as told by your health care provider. 6. After the sitz bath, pat the affected area dry first. Do not rub it. 7. Be careful when you stand up after the sitz bath because you may feel dizzy. Contact a health care provider if:  Your symptoms get worse. Do not continue with sitz baths if your symptoms get worse.  You have new symptoms. Do not continue with sitz baths until you talk with your health care provider. This information is not intended to replace advice given to you by your health care provider. Make sure you discuss any questions you have with your health care provider. Document Released: 11/04/2003 Document Revised: 07/12/2015 Document Reviewed: 02/09/2014 Elsevier Interactive Patient Education  2017 [employer].

## 2016-05-27 NOTE — Progress Notes (Signed)
Patient ID: Heather [name], female   DOB: 1950-10-09, 66 y.o.   MRN: [id number]  [facility] Surgery Progress Note  1 Day Post-Op  Subjective: No complaints this morning. Postop pain well controlled. Tolerating diet. Has not yet had dressing changed.  Objective: Vital signs in last 24 hours: Temp:  [97.2 F (36.2 C)-98.3 F (36.8 C)] 98.2 F (36.8 C) (04/02 0452) Pulse Rate:  [53-71] 63 (04/02 0452) Resp:  [14-19] 18 (04/02 0452) BP: (115-143)/(63-83) 116/65 (04/02 0452) SpO2:  [96 %-100 %] 99 % (04/02 0452) Last BM Date: 05/22/16  Intake/Output from previous day: 04/01 0701 - 04/02 0700 In: 1140 [P.O.:290; I.V.:500; IV Piggyback:350] Out: 40 [Blood:40] Intake/Output this shift: No intake/output data recorded.  PE: Gen:  Alert, NAD, pleasant Pulm:  Effort normal Abd: Soft, NT/ND Right glut: small abscess s/p I&D about 3-4 cm deep with no erythema, induration, or surrounding erythema  Lab Results:   Recent Labs  05/26/16 0328 05/27/16 0350  WBC 9.8 9.0  HGB 12.7 11.7*  HCT 39.3 36.4  PLT 316 322   BMET  Recent Labs  05/26/16 0328 05/27/16 0350  NA 136 140  K 3.2* 3.4*  CL 104 105  CO2 22 25  GLUCOSE 310* 117*  BUN 9 10  CREATININE 0.78 0.98  CALCIUM 8.6* 8.9   PT/INR  Recent Labs  05/26/16 0328  LABPROT 14.0  INR 1.07   CMP     Component Value Date/Time   NA 140 05/27/2016 0350   K 3.4 (L) 05/27/2016 0350   CL 105 05/27/2016 0350   CO2 25 05/27/2016 0350   GLUCOSE 117 (H) 05/27/2016 0350   BUN 10 05/27/2016 0350   CREATININE 0.98 05/27/2016 0350   CALCIUM 8.9 05/27/2016 0350   PROT 8.3 (H) 05/25/2016 1448   ALBUMIN 3.3 (L) 05/25/2016 1448   AST 18 05/25/2016 1448   ALT 14 05/25/2016 1448   ALKPHOS 118 05/25/2016 1448   BILITOT 0.6 05/25/2016 1448   GFRNONAA 59 (L) 05/27/2016 0350   GFRAA >60 05/27/2016 0350   Lipase  No results found for: LIPASE     Studies/Results: Ct Abdomen Pelvis W Contrast  Result Date:  05/25/2016 CLINICAL DATA:  Right gluteal abscess status post drainage 2 days prior EXAM: CT ABDOMEN AND PELVIS WITH CONTRAST TECHNIQUE: Multidetector CT imaging of the abdomen and pelvis was performed using the standard protocol following bolus administration of intravenous contrast. CONTRAST:  71mL ISOVUE-300 IOPAMIDOL (ISOVUE-300) INJECTION 61% COMPARISON:  No prior CT abdomen/ pelvis. Chest CT dated 01/01/2016. FINDINGS: Lower chest: No significant pulmonary nodules or acute consolidative airspace disease. Mild scarring versus atelectasis at the left lung base. Stable nonspecific small air cysts in the right middle lobe and medial left lower lobe. Stable nonspecific mild circumferential wall thickening in the lower thoracic esophagus near the esophagogastric junction. Hepatobiliary: Normal liver with no liver mass. Normal gallbladder with no radiopaque cholelithiasis. No biliary ductal dilatation. Pancreas: Hypodense 0.6 cm pancreatic neck lesion (series 3/ image 29). Otherwise unremarkable pancreas, with no main pancreatic duct dilation. Spleen: Normal size. No mass. Adrenals/Urinary Tract: Normal adrenals. No hydronephrosis. Hypodense 1.1 cm renal cortical lesion in the lateral interpolar right kidney (series 8/ image 12). No additional renal lesions. Normal bladder. Stomach/Bowel: Grossly normal stomach. Normal caliber small bowel with no small bowel wall thickening. Normal appendix. Normal large bowel with no diverticulosis, large bowel wall thickening or pericolonic fat stranding. Vascular/Lymphatic: Atherosclerotic nonaneurysmal abdominal aorta. Patent portal, splenic, hepatic and renal veins. No pathologically  enlarged lymph nodes in the abdomen or pelvis. Reproductive: Grossly normal uterus.  No adnexal mass. Other: No pneumoperitoneum, ascites or focal fluid collection. There is a large region of extensive fat stranding, ill-defined fluid and scattered gas in the subcutaneous soft tissues of the medial  gluteal region (series 3/ image 95), compatible with phlegmonous change, with no discrete measurable/drainable fluid collection. No appreciable perianal fistula. Musculoskeletal: No aggressive appearing focal osseous lesions. Mild thoracolumbar spondylosis. IMPRESSION: 1. Phlegmonous change in the medial right gluteal subcutaneous soft tissues, without discrete measurable/drainable fluid collection. No appreciable perianal fistula by CT. 2. **An incidental finding of potential clinical significance has been found. Subcentimeter hypodense pancreatic neck lesion without pancreatic duct dilation. Further characterization with outpatient MRI abdomen without and with IV contrast is recommended. ** 3. **An incidental finding of potential clinical significance has been found. Indeterminate low-attenuation 1.1 cm renal cortical lesion in the lateral interpolar right kidney, which can also be further characterized on an outpatient MRI abdomen without and with IV contrast. ** 4. Nonspecific mild circumferential wall thickening in the lower thoracic esophagus near the esophagogastric junction, stable since 01/01/2016 chest CT angiogram study. Further evaluation, including any decision to pursue upper endoscopy, should be based on clinical assessment. 5. Aortic atherosclerosis. Electronically Signed   By: Ilona Sorrel M.D.   On: 05/25/2016 18:03    Anti-infectives: Anti-infectives    Start     Dose/Rate Route Frequency Ordered Stop   05/26/16 2100  vancomycin (VANCOCIN) IVPB 1000 mg/200 mL premix     1,000 mg 200 mL/hr over 60 Minutes Intravenous Every 12 hours 05/26/16 1248     05/25/16 2200  piperacillin-tazobactam (ZOSYN) IVPB 3.375 g     3.375 g 12.5 mL/hr over 240 Minutes Intravenous Every 8 hours 05/25/16 1540     05/25/16 2100  vancomycin (VANCOCIN) IVPB 750 mg/150 ml premix  Status:  Discontinued     750 mg 150 mL/hr over 60 Minutes Intravenous Every 12 hours 05/25/16 1542 05/26/16 1248   05/25/16 1530   piperacillin-tazobactam (ZOSYN) IVPB 3.375 g     3.375 g 100 mL/hr over 30 Minutes Intravenous  Once 05/25/16 1528 05/25/16 1656   05/25/16 1530  vancomycin (VANCOCIN) IVPB 1000 mg/200 mL premix     1,000 mg 200 mL/hr over 60 Minutes Intravenous  Once 05/25/16 1528 05/25/16 1940       Assessment/Plan Right gluteal/perirectal abscess S/p incision and debridement perirectal abscess, final wound 3x2cm x 4cm deep 4/1 Dr. Kae Heller - POD 1 - culture pending; gram stain growing gram positive cocci and rare gram negative rods - today WBC WNL, afebrile  HTN DM H/o CAD  ID - vanco 3/31>>, zosyn 3/31>> FEN - carb modified VTE - SCDs  Plan - Packing changed. Patient is ready for discharge from surgical standpoint. If she goes home today advise that she remove packing tomorrow and start sitz bath. Recommend going home with 5 days of antibiotics. She will follow-up with Dr. Kae Heller in office in 1-2 weeks.   LOS: 2 days    Jerrye Beavers , [facility] Surgery 05/27/2016, 9:19 AM Pager: [phone] Consults: [phone] Mon-Fri 7:00 am-4:30 pm Sat-Sun 7:00 am-11:30 am

## 2016-05-27 NOTE — Progress Notes (Signed)
PROGRESS NOTE    Heather [name]  SWH:[id number] DOB: [birth year] DOA: 05/25/2016 PCP: Elyn Peers, MD   Outpatient Specialists:    Brief Narrative:  Heather [name] is a 66 y.o. 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  She had I and D in the emergency department.  There were no overt signs of sepsis at that time.  She was discharged to home with prescriptions for Bactrim and Keflex as well as ibuprofen and MS IR for pain control.  She was advised to come back for repeat evaluation in 48 hours, sooner clinical status deteriorated.  Tonight she is back and reports increased pain, 10 out of 10 at home, in her right gluteal region.  She was unable to sit down due to pain.  S/P I&D  Assessment & Plan:   Principal Problem:   Sepsis ([place]) Active Problems:   HTN (hypertension)   Diabetes ([place])   Abscess, gluteal, right   Mild sepsis secondary to right gluteal abscess in high risk patient (history of uncontrolled Type 2 Diabetes).  Failed outpatient management. -s/p I&D by general surgery-- appreciate care --Continue vanc and zosyn for now; narrow when cultures return --Blood cultures ordered in the ED -will need home health for wound care -nasal swab MRSA +  Mild AKI in the setting of decreased PO intake, N/V, and sepsis --HOLD lisinopril/HCTZ for now --Hydrate --Resolved  HTN --Amlodipine, Imdur.  Hypokalemia -replete  DM --resume glipizide now that not NPO --Aggressive SSI AC/HS -needs close monitoring  Diabetic neuropathy --Neurontin  History of mild CAD --Baby aspirin  Incidental findings on CT of 0.6cm pancreatic neck lesion and 1.1cm right renal cortex lesion.  She will need MRI of the abdomen with and without contrast at some for further evaluation of both lesions. --Patient has been advised that she will need follow-up  imaging at some point  through PCP   DVT prophylaxis:  SCD's  Code Status: Full Code  Family Communication:   Disposition Plan:     Consultants:   General surgery    Subjective: Pain better today Wants to get up and walk around  Objective: Vitals:   05/26/16 1219 05/26/16 1239 05/26/16 2130 05/27/16 0452  BP: 137/83 (!) 143/70 117/63 116/65  Pulse: (!) 55 (!) 53 71 63  Resp: 14 18 18 18   Temp:  97.7 F (36.5 C) 98.3 F (36.8 C) 98.2 F (36.8 C)  TempSrc:  Oral    SpO2: 100% 96% 99% 99%  Weight:      Height:        Intake/Output Summary (Last 24 hours) at 05/27/16 0933 Last data filed at 05/27/16 0601  Gross per 24 hour  Intake             1140 ml  Output               40 ml  Net             1100 ml   Filed Weights   05/25/16 2020  Weight: 86.3 kg (190 lb 4.1 oz)    Examination:  General exam: up on side of bed Respiratory system: clear- no wheezing Cardiovascular system: S1 & S2 heard, RRR. No JVD, murmurs, rubs, gallops or clicks. No pedal edema. Gastrointestinal system: Abdomen is obese, soft and nontender. No organomegaly or masses felt. Normal  bowel sounds heard. Central nervous system: alert, NAD Extremities: Symmetric 5 x 5 power. Skin: wound covered   Data Reviewed:   CBC:  Recent Labs Lab 05/25/16 1448 05/26/16 0328 05/27/16 0350  WBC 10.7* 9.8 9.0  NEUTROABS 8.1* 6.4  --   HGB 14.4 12.7 11.7*  HCT 44.0 39.3 36.4  MCV 82.4 82.4 82.4  PLT 385 316 993   Basic Metabolic Panel:  Recent Labs Lab 05/25/16 1448 05/26/16 0328 05/27/16 0350  NA 135 136 140  K 3.6 3.2* 3.4*  CL 97* 104 105  CO2 27 22 25   GLUCOSE 322* 310* 117*  BUN 15 9 10   CREATININE 1.25* 0.78 0.98  CALCIUM 9.7 8.6* 8.9   GFR: Estimated Creatinine Clearance: 64.6 mL/min (by C-G formula based on SCr of 0.98 mg/dL). Liver Function Tests:  Recent Labs Lab 05/25/16 1448  AST 18  ALT 14  ALKPHOS 118  BILITOT 0.6  PROT 8.3*  ALBUMIN 3.3*   No  results for input(s): LIPASE, AMYLASE in the last 168 hours. No results for input(s): AMMONIA in the last 168 hours. Coagulation Profile:  Recent Labs Lab 05/26/16 0328  INR 1.07   Cardiac Enzymes: No results for input(s): CKTOTAL, CKMB, CKMBINDEX, TROPONINI in the last 168 hours. BNP (last 3 results) No results for input(s): PROBNP in the last 8760 hours. HbA1C: No results for input(s): HGBA1C in the last 72 hours. CBG:  Recent Labs Lab 05/26/16 0825 05/26/16 1307 05/26/16 1724 05/26/16 2257 05/27/16 0802  GLUCAP 233* 139* 201* 168* 136*   Lipid Profile: No results for input(s): CHOL, HDL, LDLCALC, TRIG, CHOLHDL, LDLDIRECT in the last 72 hours. Thyroid Function Tests: No results for input(s): TSH, T4TOTAL, FREET4, T3FREE, THYROIDAB in the last 72 hours. Anemia Panel: No results for input(s): VITAMINB12, FOLATE, FERRITIN, TIBC, IRON, RETICCTPCT in the last 72 hours. Urine analysis:    Component Value Date/Time   COLORURINE AMBER (A) 05/25/2016 1454   APPEARANCEUR CLOUDY (A) 05/25/2016 1454   LABSPEC 1.025 05/25/2016 1454   PHURINE 5.0 05/25/2016 1454   GLUCOSEU >=500 (A) 05/25/2016 1454   HGBUR NEGATIVE 05/25/2016 1454   BILIRUBINUR SMALL (A) 05/25/2016 1454   KETONESUR 5 (A) 05/25/2016 1454   PROTEINUR 100 (A) 05/25/2016 1454   NITRITE NEGATIVE 05/25/2016 1454   LEUKOCYTESUR NEGATIVE 05/25/2016 1454     ) Recent Results (from the past 240 hour(s))  Culture, blood (routine x 2)     Status: None (Preliminary result)   Collection Time: 05/25/16  2:42 PM  Result Value Ref Range Status   Specimen Description BLOOD RIGHT ANTECUBITAL  Final   Special Requests BOTTLES DRAWN AEROBIC AND ANAEROBIC 5CC  Final   Culture NO GROWTH < 24 HOURS  Final   Report Status PENDING  Incomplete  Culture, blood (routine x 2)     Status: None (Preliminary result)   Collection Time: 05/25/16  2:50 PM  Result Value Ref Range Status   Specimen Description BLOOD LEFT HAND  Final    Special Requests IN PEDIATRIC BOTTLE 4CC  Final   Culture NO GROWTH < 24 HOURS  Final   Report Status PENDING  Incomplete  Surgical pcr screen     Status: Abnormal   Collection Time: 05/26/16  9:29 AM  Result Value Ref Range Status   MRSA, PCR POSITIVE (A) NEGATIVE Final   Staphylococcus aureus POSITIVE (A) NEGATIVE Final    Comment:        The Xpert SA Assay (FDA approved for NASAL specimens  in patients over 30 years of age), is one component of a comprehensive surveillance program.  Test performance has been validated by [facility] for patients greater than or equal to 86 year old. It is not intended to diagnose infection nor to guide or monitor treatment. RESULT CALLED TO, READ BACK BY AND VERIFIED WITH: G SCALCO,RN AT 1150 05/26/16 BY L BENFIELD   Aerobic/Anaerobic Culture (surgical/deep wound)     Status: None (Preliminary result)   Collection Time: 05/26/16 10:59 AM  Result Value Ref Range Status   Specimen Description WOUND  Final   Special Requests PERIRECTAL ABSCESS  Final   Gram Stain   Final    MODERATE WBC PRESENT, PREDOMINANTLY PMN MODERATE GRAM POSITIVE COCCI IN PAIRS IN CLUSTERS RARE GRAM NEGATIVE RODS    Culture PENDING  Incomplete   Report Status PENDING  Incomplete      Anti-infectives    Start     Dose/Rate Route Frequency Ordered Stop   05/26/16 2100  vancomycin (VANCOCIN) IVPB 1000 mg/200 mL premix     1,000 mg 200 mL/hr over 60 Minutes Intravenous Every 12 hours 05/26/16 1248     05/25/16 2200  piperacillin-tazobactam (ZOSYN) IVPB 3.375 g     3.375 g 12.5 mL/hr over 240 Minutes Intravenous Every 8 hours 05/25/16 1540     05/25/16 2100  vancomycin (VANCOCIN) IVPB 750 mg/150 ml premix  Status:  Discontinued     750 mg 150 mL/hr over 60 Minutes Intravenous Every 12 hours 05/25/16 1542 05/26/16 1248   05/25/16 1530  piperacillin-tazobactam (ZOSYN) IVPB 3.375 g     3.375 g 100 mL/hr over 30 Minutes Intravenous  Once 05/25/16 1528 05/25/16 1656    05/25/16 1530  vancomycin (VANCOCIN) IVPB 1000 mg/200 mL premix     1,000 mg 200 mL/hr over 60 Minutes Intravenous  Once 05/25/16 1528 05/25/16 1940       Radiology Studies: Ct Abdomen Pelvis W Contrast  Result Date: 05/25/2016 CLINICAL DATA:  Right gluteal abscess status post drainage 2 days prior EXAM: CT ABDOMEN AND PELVIS WITH CONTRAST TECHNIQUE: Multidetector CT imaging of the abdomen and pelvis was performed using the standard protocol following bolus administration of intravenous contrast. CONTRAST:  61mL ISOVUE-300 IOPAMIDOL (ISOVUE-300) INJECTION 61% COMPARISON:  No prior CT abdomen/ pelvis. Chest CT dated 01/01/2016. FINDINGS: Lower chest: No significant pulmonary nodules or acute consolidative airspace disease. Mild scarring versus atelectasis at the left lung base. Stable nonspecific small air cysts in the right middle lobe and medial left lower lobe. Stable nonspecific mild circumferential wall thickening in the lower thoracic esophagus near the esophagogastric junction. Hepatobiliary: Normal liver with no liver mass. Normal gallbladder with no radiopaque cholelithiasis. No biliary ductal dilatation. Pancreas: Hypodense 0.6 cm pancreatic neck lesion (series 3/ image 29). Otherwise unremarkable pancreas, with no main pancreatic duct dilation. Spleen: Normal size. No mass. Adrenals/Urinary Tract: Normal adrenals. No hydronephrosis. Hypodense 1.1 cm renal cortical lesion in the lateral interpolar right kidney (series 8/ image 12). No additional renal lesions. Normal bladder. Stomach/Bowel: Grossly normal stomach. Normal caliber small bowel with no small bowel wall thickening. Normal appendix. Normal large bowel with no diverticulosis, large bowel wall thickening or pericolonic fat stranding. Vascular/Lymphatic: Atherosclerotic nonaneurysmal abdominal aorta. Patent portal, splenic, hepatic and renal veins. No pathologically enlarged lymph nodes in the abdomen or pelvis. Reproductive: Grossly  normal uterus.  No adnexal mass. Other: No pneumoperitoneum, ascites or focal fluid collection. There is a large region of extensive fat stranding, ill-defined fluid and scattered gas  in the subcutaneous soft tissues of the medial gluteal region (series 3/ image 95), compatible with phlegmonous change, with no discrete measurable/drainable fluid collection. No appreciable perianal fistula. Musculoskeletal: No aggressive appearing focal osseous lesions. Mild thoracolumbar spondylosis. IMPRESSION: 1. Phlegmonous change in the medial right gluteal subcutaneous soft tissues, without discrete measurable/drainable fluid collection. No appreciable perianal fistula by CT. 2. **An incidental finding of potential clinical significance has been found. Subcentimeter hypodense pancreatic neck lesion without pancreatic duct dilation. Further characterization with outpatient MRI abdomen without and with IV contrast is recommended. ** 3. **An incidental finding of potential clinical significance has been found. Indeterminate low-attenuation 1.1 cm renal cortical lesion in the lateral interpolar right kidney, which can also be further characterized on an outpatient MRI abdomen without and with IV contrast. ** 4. Nonspecific mild circumferential wall thickening in the lower thoracic esophagus near the esophagogastric junction, stable since 01/01/2016 chest CT angiogram study. Further evaluation, including any decision to pursue upper endoscopy, should be based on clinical assessment. 5. Aortic atherosclerosis. Electronically Signed   By: Ilona Sorrel M.D.   On: 05/25/2016 18:03        Scheduled Meds: . amLODipine  10 mg Oral Daily  . aspirin  81 mg Oral Daily  . Chlorhexidine Gluconate Cloth  6 each Topical Q0600  . docusate sodium  100 mg Oral BID  . gabapentin  100 mg Oral BID  . glipiZIDE  10 mg Oral BID AC  . insulin aspart  0-20 Units Subcutaneous TID WC  . insulin aspart  0-5 Units Subcutaneous QHS  . isosorbide  mononitrate  30 mg Oral Daily  . mupirocin ointment  1 application Nasal BID  . piperacillin-tazobactam (ZOSYN)  IV  3.375 g Intravenous Q8H  . pneumococcal 23 valent vaccine  0.5 mL Intramuscular Tomorrow-1000  . senna  1 tablet Oral BID  . sodium chloride flush  3 mL Intravenous Q12H  . vancomycin  1,000 mg Intravenous Q12H   Continuous Infusions:   LOS: 2 days    Time spent: 35 min    [place], DO [facility] Pager [phone]  If 7PM-7AM, please contact night-coverage [email or web address] Password [facility] 05/27/2016, 9:33 AM

## 2016-05-28 LAB — GLUCOSE, CAPILLARY
GLUCOSE-CAPILLARY: 286 mg/dL — AB (ref 65–99)
Glucose-Capillary: 202 mg/dL — ABNORMAL HIGH (ref 65–99)

## 2016-05-28 MED ORDER — MUPIROCIN 2 % EX OINT: 1.0000 "application " | TOPICAL_OINTMENT | Freq: Two times a day (BID) | CUTANEOUS | 0 refills | Status: DC

## 2016-05-28 MED ORDER — BENZONATATE 100 MG PO CAPS: 100.0000 mg | ORAL_CAPSULE | Freq: Three times a day (TID) | ORAL | 0 refills | Status: DC | PRN

## 2016-05-28 MED ORDER — CHLORHEXIDINE GLUCONATE CLOTH 2 % EX PADS: 6.0000 | MEDICATED_PAD | Freq: Every day | CUTANEOUS | 0 refills | Status: DC

## 2016-05-28 NOTE — Progress Notes (Signed)
Heather [name] to be D/C'd to home per MD order.  Discussed with the patient and all questions fully answered.  VSS, Skin clean, dry and intact without evidence of skin break down, no evidence of skin tears noted. IV catheter discontinued intact. Site without signs and symptoms of complications. Dressing and pressure applied.  An After Visit Summary was printed and given to the patient. Patient received prescriptions.  D/c education completed with patient/family including follow up instructions, medication list, d/c activities limitations if indicated, with other d/c instructions as indicated by MD - patient able to verbalize understanding, all questions fully answered.   Patient instructed to return to ED, call 911, or call MD for any changes in condition.   Patient escorted via [place], and D/C home via private auto.  Heather [name] 05/28/2016 1:02 PM

## 2016-05-28 NOTE — Progress Notes (Signed)
2 Days Post-Op  Subjective: Stable and alert.  Pain controlled hoping to go home  Wound cultures growing staph aureus and rare gram-negative rods Currently on Zosyn and vancomycin  Objective: Vital signs in last 24 hours: Temp:  [98.5 F (36.9 C)] 98.5 F (36.9 C) (04/02 2201) Pulse Rate:  [69] 69 (04/02 2201) Resp:  [17] 17 (04/02 2201) BP: (142)/(75) 142/75 (04/02 2201) SpO2:  [98 %] 98 % (04/02 2201) Last BM Date: 05/27/16  Intake/Output from previous day: 04/02 0701 - 04/03 0700 In: -  Out: 200 [Urine:200] Intake/Output this shift: Total I/O In: -  Out: 200 [Urine:200]   EXAM: General appearance: Alert.  Cooperative.  Friendly.  mental status normal.  Nontoxic. GI: soft, non-tender; bowel sounds normal; no masses,  no organomegaly Skin: Right gluteal wound open.  Examined.  Seems about 3-4 cm deep but totally and subcutaneous tissue.  No necrosis.  No.  drainage.  No odor.  Surrounding skin is soft.  No significant induration.  Redressed with external bandage.  No packing  Lab Results:  Results for orders placed or performed during the hospital encounter of 05/25/16 (from the past 24 hour(s))  Glucose, capillary     Status: Abnormal   Collection Time: 05/27/16  8:02 AM  Result Value Ref Range   Glucose-Capillary 136 (H) 65 - 99 mg/dL  Glucose, capillary     Status: Abnormal   Collection Time: 05/27/16 12:00 PM  Result Value Ref Range   Glucose-Capillary 254 (H) 65 - 99 mg/dL  Glucose, capillary     Status: Abnormal   Collection Time: 05/27/16  5:30 PM  Result Value Ref Range   Glucose-Capillary 128 (H) 65 - 99 mg/dL  Glucose, capillary     Status: Abnormal   Collection Time: 05/27/16 10:01 PM  Result Value Ref Range   Glucose-Capillary 197 (H) 65 - 99 mg/dL     Studies/Results: No results found.  Marland Kitchen amLODipine  10 mg Oral Daily  . aspirin  81 mg Oral Daily  . Chlorhexidine Gluconate Cloth  6 each Topical Q0600  . docusate sodium  100 mg Oral BID  .  gabapentin  100 mg Oral BID  . glipiZIDE  10 mg Oral BID AC  . guaiFENesin  600 mg Oral BID  . insulin aspart  0-20 Units Subcutaneous TID WC  . insulin aspart  0-5 Units Subcutaneous QHS  . isosorbide mononitrate  30 mg Oral Daily  . mupirocin ointment  1 application Nasal BID  . piperacillin-tazobactam (ZOSYN)  IV  3.375 g Intravenous Q8H  . pneumococcal 23 valent vaccine  0.5 mL Intramuscular Tomorrow-1000  . senna  1 tablet Oral BID  . sodium chloride flush  3 mL Intravenous Q12H  . vancomycin  1,000 mg Intravenous Q12H     Assessment/Plan: s/p Procedure(s): IRRIGATION AND DEBRIDEMENT PERIRECTAL ABSCESS   Right gluteal/perirectal abscess S/p incision and debridement perirectal abscess, final wound 3x2cm x 4cm deep 4/1 Dr. Kae Heller - POD 2 - culture pending; gram stain growingS.Aureusand rare gram negative rods - today, afebrile  HTN DM H/o CAD  ID - vanco 3/31>>, zosyn 3/31>> FEN - carb modified VTE - SCDs    Plan -1)   patient is ready for discharge from general surgery standpoint           2)    wound care orders written.  Sitz baths 3 times a day.  No packing.  Covered with dry gauze and fishnet panties  3)   Antibiotics for no more than 5 days as outpatient.  Considering cultures, Bactrim DS would be a good selection             4)   Follow-up with Dr. Jens Som at [facility] surgery in 2 weeks. .      @PROBHOSP @  LOS: 3 days    Heather [name] M 05/28/2016  . .prob

## 2016-05-28 NOTE — Discharge Summary (Signed)
Physician Discharge Summary  Heather [name] SJG:[id number] DOB: 24-Oct-1950 DOA: 05/25/2016  PCP: Elyn Peers, MD  Admit date: 05/25/2016 Discharge date: 05/28/2016   Recommendations for Outpatient Follow-Up:   1. Final culture pending    Discharge Diagnosis:   Principal Problem:   Sepsis ([place]) Active Problems:   HTN (hypertension)   Diabetes ([place])   Abscess, gluteal, right   Discharge disposition:  Home:  Discharge Condition: Improved.  Diet recommendation: Low sodium, heart healthy.  Carbohydrate-modified.  Regular.  Wound care: None.   History of Present Illness:   Heather [name] is a 66 y.o. 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  She had I and D in the emergency department.  There were no overt signs of sepsis at that time.  She was discharged to home with prescriptions for Bactrim and Keflex as well as ibuprofen and MS IR for pain control.  She was advised to come back for repeat evaluation in 48 hours, sooner clinical status deteriorated.  Tonight she is back and reports increased pain, 10 out of 10 at home, in her right gluteal region.  She is unable to sit down due to pain.  She has mostly been lying on her left side since Thursday.  She denies fever or sweats, but she has been feeling cold and has had chills.  She has one episode of nausea and vomiting.  She has had light-headedness, but no LOC.   Hospital Course by Problem:   Right gluteal/perirectal abscess -s/p I&D -growing S. Aureus -Bactrim x 5 more days -a febrile -sitz bath 3x day -follow up with general surgery in 2 weeks  DM -resume home meds  HTn -resume home meds  Hypokalemia -repleted   Medical Consultants:    General surgery.   Discharge Exam:   Vitals:   05/27/16 2201 05/28/16 0640  BP: (!) 142/75 (!) 148/78  Pulse: 69 66  Resp: 17 18   Temp: 98.5 F (36.9 C) 98.2 F (36.8 C)   Vitals:   05/26/16 2130 05/27/16 0452 05/27/16 2201 05/28/16 0640  BP: 117/63 116/65 (!) 142/75 (!) 148/78  Pulse: 71 63 69 66  Resp: 18 18 17 18   Temp: 98.3 F (36.8 C) 98.2 F (36.8 C) 98.5 F (36.9 C) 98.2 F (36.8 C)  TempSrc:    Oral  SpO2: 99% 99% 98% 95%  Weight:      Height:        Gen:  NAD    The results of significant diagnostics from this hospitalization (including imaging, microbiology, ancillary and laboratory) are listed below for reference.     Procedures and Diagnostic Studies:   Ct Abdomen Pelvis W Contrast  Result Date: 05/25/2016 CLINICAL DATA:  Right gluteal abscess status post drainage 2 days prior EXAM: CT ABDOMEN AND PELVIS WITH CONTRAST TECHNIQUE: Multidetector CT imaging of the abdomen and pelvis was performed using the standard protocol following bolus administration of intravenous contrast. CONTRAST:  89mL ISOVUE-300 IOPAMIDOL (ISOVUE-300) INJECTION 61% COMPARISON:  No prior CT abdomen/ pelvis. Chest CT dated 01/01/2016. FINDINGS: Lower chest: No significant pulmonary nodules or acute consolidative airspace disease. Mild scarring versus atelectasis at the left lung base. Stable nonspecific small air cysts in the right middle lobe and medial left lower lobe. Stable nonspecific mild circumferential wall thickening in the lower thoracic esophagus near the esophagogastric  junction. Hepatobiliary: Normal liver with no liver mass. Normal gallbladder with no radiopaque cholelithiasis. No biliary ductal dilatation. Pancreas: Hypodense 0.6 cm pancreatic neck lesion (series 3/ image 29). Otherwise unremarkable pancreas, with no main pancreatic duct dilation. Spleen: Normal size. No mass. Adrenals/Urinary Tract: Normal adrenals. No hydronephrosis. Hypodense 1.1 cm renal cortical lesion in the lateral interpolar right kidney (series 8/ image 12). No additional renal lesions. Normal bladder. Stomach/Bowel: Grossly normal  stomach. Normal caliber small bowel with no small bowel wall thickening. Normal appendix. Normal large bowel with no diverticulosis, large bowel wall thickening or pericolonic fat stranding. Vascular/Lymphatic: Atherosclerotic nonaneurysmal abdominal aorta. Patent portal, splenic, hepatic and renal veins. No pathologically enlarged lymph nodes in the abdomen or pelvis. Reproductive: Grossly normal uterus.  No adnexal mass. Other: No pneumoperitoneum, ascites or focal fluid collection. There is a large region of extensive fat stranding, ill-defined fluid and scattered gas in the subcutaneous soft tissues of the medial gluteal region (series 3/ image 95), compatible with phlegmonous change, with no discrete measurable/drainable fluid collection. No appreciable perianal fistula. Musculoskeletal: No aggressive appearing focal osseous lesions. Mild thoracolumbar spondylosis. IMPRESSION: 1. Phlegmonous change in the medial right gluteal subcutaneous soft tissues, without discrete measurable/drainable fluid collection. No appreciable perianal fistula by CT. 2. **An incidental finding of potential clinical significance has been found. Subcentimeter hypodense pancreatic neck lesion without pancreatic duct dilation. Further characterization with outpatient MRI abdomen without and with IV contrast is recommended. ** 3. **An incidental finding of potential clinical significance has been found. Indeterminate low-attenuation 1.1 cm renal cortical lesion in the lateral interpolar right kidney, which can also be further characterized on an outpatient MRI abdomen without and with IV contrast. ** 4. Nonspecific mild circumferential wall thickening in the lower thoracic esophagus near the esophagogastric junction, stable since 01/01/2016 chest CT angiogram study. Further evaluation, including any decision to pursue upper endoscopy, should be based on clinical assessment. 5. Aortic atherosclerosis. Electronically Signed   By: Ilona Sorrel M.D.   On: 05/25/2016 18:03     Labs:   Basic Metabolic Panel:  Recent Labs Lab 05/25/16 1448 05/26/16 0328 05/27/16 0350  NA 135 136 140  K 3.6 3.2* 3.4*  CL 97* 104 105  CO2 27 22 25   GLUCOSE 322* 310* 117*  BUN 15 9 10   CREATININE 1.25* 0.78 0.98  CALCIUM 9.7 8.6* 8.9   GFR Estimated Creatinine Clearance: 64.6 mL/min (by C-G formula based on SCr of 0.98 mg/dL). Liver Function Tests:  Recent Labs Lab 05/25/16 1448  AST 18  ALT 14  ALKPHOS 118  BILITOT 0.6  PROT 8.3*  ALBUMIN 3.3*   No results for input(s): LIPASE, AMYLASE in the last 168 hours. No results for input(s): AMMONIA in the last 168 hours. Coagulation profile  Recent Labs Lab 05/26/16 0328  INR 1.07    CBC:  Recent Labs Lab 05/25/16 1448 05/26/16 0328 05/27/16 0350  WBC 10.7* 9.8 9.0  NEUTROABS 8.1* 6.4  --   HGB 14.4 12.7 11.7*  HCT 44.0 39.3 36.4  MCV 82.4 82.4 82.4  PLT 385 316 322   Cardiac Enzymes: No results for input(s): CKTOTAL, CKMB, CKMBINDEX, TROPONINI in the last 168 hours. BNP: Invalid input(s): POCBNP CBG:  Recent Labs Lab 05/27/16 0802 05/27/16 1200 05/27/16 1730 05/27/16 2201 05/28/16 0831  GLUCAP 136* 254* 128* 197* 202*   D-Dimer No results for input(s): DDIMER in the last 72 hours. Hgb A1c No results for input(s): HGBA1C in the last 72 hours. Lipid Profile No  results for input(s): CHOL, HDL, LDLCALC, TRIG, CHOLHDL, LDLDIRECT in the last 72 hours. Thyroid function studies No results for input(s): TSH, T4TOTAL, T3FREE, THYROIDAB in the last 72 hours.  Invalid input(s): FREET3 Anemia work up No results for input(s): VITAMINB12, FOLATE, FERRITIN, TIBC, IRON, RETICCTPCT in the last 72 hours. Microbiology Recent Results (from the past 240 hour(s))  Culture, blood (routine x 2)     Status: None (Preliminary result)   Collection Time: 05/25/16  2:42 PM  Result Value Ref Range Status   Specimen Description BLOOD RIGHT ANTECUBITAL  Final   Special  Requests BOTTLES DRAWN AEROBIC AND ANAEROBIC 5CC  Final   Culture NO GROWTH 2 DAYS  Final   Report Status PENDING  Incomplete  Culture, blood (routine x 2)     Status: None (Preliminary result)   Collection Time: 05/25/16  2:50 PM  Result Value Ref Range Status   Specimen Description BLOOD LEFT HAND  Final   Special Requests IN PEDIATRIC BOTTLE 4CC  Final   Culture NO GROWTH 2 DAYS  Final   Report Status PENDING  Incomplete  Urine culture     Status: Abnormal   Collection Time: 05/25/16  7:10 PM  Result Value Ref Range Status   Specimen Description URINE, RANDOM  Final   Special Requests NONE  Final   Culture MULTIPLE SPECIES PRESENT, SUGGEST RECOLLECTION (A)  Final   Report Status 05/27/2016 FINAL  Final  Surgical pcr screen     Status: Abnormal   Collection Time: 05/26/16  9:29 AM  Result Value Ref Range Status   MRSA, PCR POSITIVE (A) NEGATIVE Final   Staphylococcus aureus POSITIVE (A) NEGATIVE Final    Comment:        The Xpert SA Assay (FDA approved for NASAL specimens in patients over 33 years of age), is one component of a comprehensive surveillance program.  Test performance has been validated by [facility] for patients greater than or equal to 59 year old. It is not intended to diagnose infection nor to guide or monitor treatment. RESULT CALLED TO, READ BACK BY AND VERIFIED WITH: G SCALCO,RN AT 1150 05/26/16 BY L BENFIELD   Aerobic/Anaerobic Culture (surgical/deep wound)     Status: None (Preliminary result)   Collection Time: 05/26/16 10:59 AM  Result Value Ref Range Status   Specimen Description WOUND  Final   Special Requests PERIRECTAL ABSCESS  Final   Gram Stain   Final    MODERATE WBC PRESENT, PREDOMINANTLY PMN MODERATE GRAM POSITIVE COCCI IN PAIRS IN CLUSTERS RARE GRAM NEGATIVE RODS    Culture   Final    ABUNDANT STAPHYLOCOCCUS AUREUS SUSCEPTIBILITIES TO FOLLOW    Report Status PENDING  Incomplete     Discharge Instructions:   Discharge  Instructions    Diet - low sodium heart healthy    Complete by:  As directed    Diet Carb Modified    Complete by:  As directed    Discharge instructions    Complete by:  As directed    Sitz baths 3 times a day.  No packing.  Covered with dry gauze and fishnet panties   Increase activity slowly    Complete by:  As directed      Allergies as of 05/28/2016   No Known Allergies     Medication List    STOP taking these medications   cephALEXin 500 MG capsule Commonly known as:  [place] these medications   amLODipine 10  MG tablet Commonly known as:  NORVASC Take 10 mg by mouth daily.   aspirin 81 MG chewable tablet Chew 1 tablet (81 mg total) by mouth daily.   benzonatate 100 MG capsule Commonly known as:  TESSALON Take 1 capsule (100 mg total) by mouth 3 (three) times daily as needed for cough. What changed:  when to take this  reasons to take this   Chlorhexidine Gluconate Cloth 2 % Pads Apply 6 each topically daily at 6 (six) AM. Start taking on:  05/29/2016   gabapentin 100 MG capsule Commonly known as:  NEURONTIN Take 100 mg by mouth 2 (two) times daily.   glipiZIDE 10 MG tablet Commonly known as:  GLUCOTROL Take 1 tablet (10 mg total) by mouth 2 (two) times daily before a meal.   isosorbide mononitrate 30 MG 24 hr tablet Commonly known as:  IMDUR Take 1 tablet (30 mg total) by mouth daily.   lisinopril-hydrochlorothiazide 20-25 MG tablet Commonly known as:  PRINZIDE,ZESTORETIC Take 1 tablet by mouth daily.   morphine 15 MG tablet Commonly known as:  MSIR Take 1 tablet (15 mg total) by mouth every 4 (four) hours as needed for severe pain.   mupirocin ointment 2 % Commonly known as:  BACTROBAN Place 1 application into the nose 2 (two) times daily.   sulfamethoxazole-trimethoprim 800-160 MG tablet Commonly known as:  BACTRIM DS,SEPTRA DS Take 1 tablet by mouth 2 (two) times daily. What changed:  additional instructions      Follow-up  Information    Clovis Riley, MD. Schedule an appointment as soon as possible for a visit in 2 week(s).   Specialty:  General Surgery Why:  Please call as soon as you leave the hospital to make a follow-up appointment with Dr. Kae Heller in 1-2 weeks for follow-up from your surgery. Contact information: [place], [place] Follow up in 1 week(s).   Specialty:  Family Medicine Contact information: [address] [place] [address] [phone]            Time coordinating discharge: 35 min  Signed:  [place]   [facility] 05/28/2016, 8:51 AM

## 2016-05-30 DIAGNOSIS — E089 Diabetes mellitus due to underlying condition without complications: Secondary | ICD-10-CM | POA: Diagnosis not present

## 2016-05-30 DIAGNOSIS — E785 Hyperlipidemia, unspecified: Secondary | ICD-10-CM | POA: Diagnosis not present

## 2016-05-30 DIAGNOSIS — I10 Essential (primary) hypertension: Secondary | ICD-10-CM | POA: Diagnosis not present

## 2016-05-30 DIAGNOSIS — Z7289 Other problems related to lifestyle: Secondary | ICD-10-CM | POA: Diagnosis not present

## 2016-05-30 LAB — CULTURE, BLOOD (ROUTINE X 2)
CULTURE: NO GROWTH
Culture: NO GROWTH

## 2016-06-02 LAB — AEROBIC/ANAEROBIC CULTURE W GRAM STAIN (SURGICAL/DEEP WOUND)

## 2016-06-02 LAB — AEROBIC/ANAEROBIC CULTURE (SURGICAL/DEEP WOUND)

## 2016-06-20 DIAGNOSIS — E785 Hyperlipidemia, unspecified: Secondary | ICD-10-CM | POA: Diagnosis not present

## 2016-06-20 DIAGNOSIS — I10 Essential (primary) hypertension: Secondary | ICD-10-CM | POA: Diagnosis not present

## 2016-06-20 DIAGNOSIS — E0842 Diabetes mellitus due to underlying condition with diabetic polyneuropathy: Secondary | ICD-10-CM | POA: Diagnosis not present

## 2016-06-20 DIAGNOSIS — E088 Diabetes mellitus due to underlying condition with unspecified complications: Secondary | ICD-10-CM | POA: Diagnosis not present

## 2016-07-02 DIAGNOSIS — F064 Anxiety disorder due to known physiological condition: Secondary | ICD-10-CM | POA: Diagnosis not present

## 2016-07-02 DIAGNOSIS — I10 Essential (primary) hypertension: Secondary | ICD-10-CM | POA: Diagnosis not present

## 2016-07-02 DIAGNOSIS — Z595 Extreme poverty: Secondary | ICD-10-CM | POA: Diagnosis not present

## 2016-07-02 DIAGNOSIS — E089 Diabetes mellitus due to underlying condition without complications: Secondary | ICD-10-CM | POA: Diagnosis not present

## 2016-07-02 DIAGNOSIS — E785 Hyperlipidemia, unspecified: Secondary | ICD-10-CM | POA: Diagnosis not present

## 2016-07-27 NOTE — Addendum Note (Signed)
Addendum  created 07/27/16 0759 by Duane Boston, MD   Sign clinical note

## 2016-08-16 DIAGNOSIS — G5601 Carpal tunnel syndrome, right upper limb: Secondary | ICD-10-CM | POA: Diagnosis not present

## 2016-08-16 DIAGNOSIS — I10 Essential (primary) hypertension: Secondary | ICD-10-CM | POA: Diagnosis not present

## 2016-08-16 DIAGNOSIS — E782 Mixed hyperlipidemia: Secondary | ICD-10-CM | POA: Diagnosis not present

## 2016-08-16 DIAGNOSIS — E785 Hyperlipidemia, unspecified: Secondary | ICD-10-CM | POA: Diagnosis not present

## 2016-08-16 DIAGNOSIS — E089 Diabetes mellitus due to underlying condition without complications: Secondary | ICD-10-CM | POA: Diagnosis not present

## 2016-09-06 DIAGNOSIS — R200 Anesthesia of skin: Secondary | ICD-10-CM | POA: Diagnosis not present

## 2016-10-09 DIAGNOSIS — G5601 Carpal tunnel syndrome, right upper limb: Secondary | ICD-10-CM | POA: Diagnosis not present

## 2016-10-09 DIAGNOSIS — M25511 Pain in right shoulder: Secondary | ICD-10-CM | POA: Diagnosis not present

## 2016-10-15 DIAGNOSIS — E089 Diabetes mellitus due to underlying condition without complications: Secondary | ICD-10-CM | POA: Diagnosis not present

## 2016-10-15 DIAGNOSIS — I10 Essential (primary) hypertension: Secondary | ICD-10-CM | POA: Diagnosis not present

## 2016-10-15 DIAGNOSIS — E782 Mixed hyperlipidemia: Secondary | ICD-10-CM | POA: Diagnosis not present

## 2016-11-14 DIAGNOSIS — E118 Type 2 diabetes mellitus with unspecified complications: Secondary | ICD-10-CM | POA: Diagnosis not present

## 2016-11-14 DIAGNOSIS — E088 Diabetes mellitus due to underlying condition with unspecified complications: Secondary | ICD-10-CM | POA: Diagnosis not present

## 2016-11-14 DIAGNOSIS — I10 Essential (primary) hypertension: Secondary | ICD-10-CM | POA: Diagnosis not present

## 2016-11-14 DIAGNOSIS — F064 Anxiety disorder due to known physiological condition: Secondary | ICD-10-CM | POA: Diagnosis not present

## 2016-11-18 DIAGNOSIS — Z1231 Encounter for screening mammogram for malignant neoplasm of breast: Secondary | ICD-10-CM | POA: Diagnosis not present

## 2016-12-07 ENCOUNTER — Emergency Department (HOSPITAL_COMMUNITY)
Admission: EM | Admit: 2016-12-07 | Discharge: 2016-12-07 | Disposition: A | Discharge status: Home / Self Care | Payer: Medicare Other | Attending: Emergency Medicine | Admitting: Emergency Medicine

## 2016-12-07 ENCOUNTER — Emergency Department (HOSPITAL_COMMUNITY): Payer: Medicare Other

## 2016-12-07 ENCOUNTER — Encounter (HOSPITAL_COMMUNITY): Payer: Self-pay | Admitting: *Deleted

## 2016-12-07 DIAGNOSIS — E1140 Type 2 diabetes mellitus with diabetic neuropathy, unspecified: Secondary | ICD-10-CM | POA: Insufficient documentation

## 2016-12-07 DIAGNOSIS — Z7984 Long term (current) use of oral hypoglycemic drugs: Secondary | ICD-10-CM | POA: Insufficient documentation

## 2016-12-07 DIAGNOSIS — Z79899 Other long term (current) drug therapy: Secondary | ICD-10-CM | POA: Diagnosis not present

## 2016-12-07 DIAGNOSIS — S299XXA Unspecified injury of thorax, initial encounter: Secondary | ICD-10-CM | POA: Diagnosis not present

## 2016-12-07 DIAGNOSIS — R0789 Other chest pain: Secondary | ICD-10-CM | POA: Diagnosis not present

## 2016-12-07 DIAGNOSIS — M5489 Other dorsalgia: Secondary | ICD-10-CM | POA: Diagnosis not present

## 2016-12-07 DIAGNOSIS — I10 Essential (primary) hypertension: Secondary | ICD-10-CM | POA: Insufficient documentation

## 2016-12-07 DIAGNOSIS — Z7982 Long term (current) use of aspirin: Secondary | ICD-10-CM | POA: Diagnosis not present

## 2016-12-07 MED ORDER — NAPROXEN 500 MG PO TABS: 500.0000 mg | ORAL_TABLET | Freq: Two times a day (BID) | ORAL | 0 refills | Status: AC

## 2016-12-07 MED ORDER — KETOROLAC TROMETHAMINE 30 MG/ML IJ SOLN
15.0000 mg | Freq: Once | INTRAMUSCULAR | Status: AC
  Administered 2016-12-07: 15 mg via INTRAMUSCULAR
  Filled 2016-12-07: qty 1
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
## 2017-02-19 ENCOUNTER — Emergency Department (HOSPITAL_COMMUNITY): Payer: Medicare Other

## 2017-02-19 ENCOUNTER — Emergency Department (HOSPITAL_COMMUNITY)
Admission: EM | Admit: 2017-02-19 | Discharge: 2017-02-19 | Disposition: A | Discharge status: Home / Self Care | Payer: Medicare Other | Attending: Emergency Medicine | Admitting: Emergency Medicine

## 2017-02-19 ENCOUNTER — Other Ambulatory Visit: Payer: Self-pay

## 2017-02-19 ENCOUNTER — Encounter (HOSPITAL_COMMUNITY): Payer: Self-pay | Admitting: *Deleted

## 2017-02-19 DIAGNOSIS — R739 Hyperglycemia, unspecified: Secondary | ICD-10-CM

## 2017-02-19 DIAGNOSIS — Z7984 Long term (current) use of oral hypoglycemic drugs: Secondary | ICD-10-CM | POA: Diagnosis not present

## 2017-02-19 DIAGNOSIS — M79605 Pain in left leg: Secondary | ICD-10-CM

## 2017-02-19 DIAGNOSIS — M79662 Pain in left lower leg: Secondary | ICD-10-CM | POA: Diagnosis not present

## 2017-02-19 DIAGNOSIS — Z79899 Other long term (current) drug therapy: Secondary | ICD-10-CM | POA: Diagnosis not present

## 2017-02-19 DIAGNOSIS — I10 Essential (primary) hypertension: Secondary | ICD-10-CM | POA: Diagnosis not present

## 2017-02-19 DIAGNOSIS — E1165 Type 2 diabetes mellitus with hyperglycemia: Secondary | ICD-10-CM | POA: Diagnosis not present

## 2017-02-19 LAB — CBC
HCT: 43.9 % (ref 36.0–46.0)
HEMOGLOBIN: 14.5 g/dL (ref 12.0–15.0)
MCH: 27.8 pg (ref 26.0–34.0)
MCHC: 33 g/dL (ref 30.0–36.0)
MCV: 84.3 fL (ref 78.0–100.0)
Platelets: 309 10*3/uL (ref 150–400)
RBC: 5.21 MIL/uL — AB (ref 3.87–5.11)
RDW: 14.2 % (ref 11.5–15.5)
WBC: 6.9 10*3/uL (ref 4.0–10.5)

## 2017-02-19 LAB — URINALYSIS, ROUTINE W REFLEX MICROSCOPIC
BILIRUBIN URINE: NEGATIVE
GLUCOSE, UA: 150 mg/dL — AB
HGB URINE DIPSTICK: NEGATIVE
Ketones, ur: 5 mg/dL — AB
NITRITE: NEGATIVE
PROTEIN: 100 mg/dL — AB
Specific Gravity, Urine: 1.033 — ABNORMAL HIGH (ref 1.005–1.030)
pH: 5 (ref 5.0–8.0)

## 2017-02-19 LAB — BASIC METABOLIC PANEL
ANION GAP: 7 (ref 5–15)
BUN: 13 mg/dL (ref 6–20)
CHLORIDE: 105 mmol/L (ref 101–111)
CO2: 24 mmol/L (ref 22–32)
Calcium: 9.5 mg/dL (ref 8.9–10.3)
Creatinine, Ser: 0.99 mg/dL (ref 0.44–1.00)
GFR, EST NON AFRICAN AMERICAN: 58 mL/min — AB (ref >60)
Glucose, Bld: 265 mg/dL — ABNORMAL HIGH (ref 65–99)
POTASSIUM: 4.3 mmol/L (ref 3.5–5.1)
SODIUM: 136 mmol/L (ref 135–145)

## 2017-02-19 LAB — CBG MONITORING, ED: GLUCOSE-CAPILLARY: 239 mg/dL — AB (ref 65–99)

## 2017-02-19 MED ORDER — AMLODIPINE BESYLATE 10 MG PO TABS: 10.0000 mg | ORAL_TABLET | Freq: Every day | ORAL | 0 refills | Status: DC

## 2017-02-19 MED ORDER — TRAMADOL HCL 50 MG PO TABS: 50.0000 mg | ORAL_TABLET | Freq: Four times a day (QID) | ORAL | 0 refills | Status: DC | PRN

## 2017-02-19 MED ORDER — TRAMADOL HCL 50 MG PO TABS
50.0000 mg | ORAL_TABLET | Freq: Once | ORAL | Status: AC
  Administered 2017-02-19: 50 mg via ORAL
  Filled 2017-02-19: qty 1

## 2017-02-19 MED ORDER — GLIPIZIDE 10 MG PO TABS: 10.0000 mg | ORAL_TABLET | Freq: Two times a day (BID) | ORAL | 0 refills | Status: DC

## 2017-02-19 MED ORDER — LISINOPRIL-HYDROCHLOROTHIAZIDE 20-25 MG PO TABS: 1.0000 | ORAL_TABLET | Freq: Every day | ORAL | 0 refills | Status: DC
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
## 2017-02-19 NOTE — Discharge Instructions (Signed)
Please read attached information. If you experience any new or worsening signs or symptoms please return to the emergency room for evaluation. Please follow-up with your primary care provider or specialist as discussed. Please use medication prescribed only as directed and discontinue taking if you have any concerning signs or symptoms.   °

## 2017-02-19 NOTE — ED Provider Notes (Signed)
Of left leg pain at site of surgical scar overlying proximal tibia onset 2 days ago.  Pain is worse with sitting or by anything.  No fever.  No trauma.  On exam alert and in no distress left lower extremity skin intact.  There is a surgical scar overlying the proximal shin.  No swelling no redness minimally tender overlying surgical scar.  DP pulse 2+.  Good capillary refill X-ray viewed by me   Orlie Dakin, MD 02/19/17 [id number]

## 2017-02-19 NOTE — ED Notes (Signed)
Checked patient blood sugar it was 239 notified RN of blood sugar
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
## 2018-04-06 ENCOUNTER — Encounter: Payer: Self-pay | Admitting: Student in an Organized Health Care Education/Training Program

## 2018-04-06 ENCOUNTER — Ambulatory Visit (INDEPENDENT_AMBULATORY_CARE_PROVIDER_SITE_OTHER): Payer: Medicare HMO | Admitting: Student in an Organized Health Care Education/Training Program

## 2018-04-06 ENCOUNTER — Telehealth: Payer: Self-pay | Admitting: Student in an Organized Health Care Education/Training Program

## 2018-04-06 VITALS — BP 194/100 | HR 86 | Temp 97.9°F | Ht 67.0 in | Wt 200.0 lb

## 2018-04-06 DIAGNOSIS — G629 Polyneuropathy, unspecified: Secondary | ICD-10-CM | POA: Diagnosis not present

## 2018-04-06 DIAGNOSIS — R69 Illness, unspecified: Secondary | ICD-10-CM | POA: Diagnosis not present

## 2018-04-06 DIAGNOSIS — F4323 Adjustment disorder with mixed anxiety and depressed mood: Secondary | ICD-10-CM

## 2018-04-06 DIAGNOSIS — I10 Essential (primary) hypertension: Secondary | ICD-10-CM | POA: Diagnosis not present

## 2018-04-06 DIAGNOSIS — E11628 Type 2 diabetes mellitus with other skin complications: Secondary | ICD-10-CM

## 2018-04-06 LAB — POCT GLYCOSYLATED HEMOGLOBIN (HGB A1C): HbA1c, POC (controlled diabetic range): 12.6 % — AB (ref 0.0–7.0)

## 2018-04-06 MED ORDER — GABAPENTIN 100 MG PO CAPS: 100.0000 mg | ORAL_CAPSULE | Freq: Every day | ORAL | 0 refills | Status: DC

## 2018-04-06 MED ORDER — LISINOPRIL-HYDROCHLOROTHIAZIDE 20-25 MG PO TABS: 1.0000 | ORAL_TABLET | Freq: Every day | ORAL | 0 refills | Status: DC

## 2018-04-06 MED ORDER — METFORMIN HCL 500 MG PO TABS: 500.0000 mg | ORAL_TABLET | Freq: Every day | ORAL | 3 refills | Status: DC

## 2018-04-06 NOTE — Telephone Encounter (Signed)
Patient was seen today 04/06/18 by Dr. Doristine Mango and was prescribed two medicines that are considered controlled substances.  Meds are gabapentin and lisinopril hydrochlorothiazide.  Pharmacy will not fill them without authorization from doctor.  Please call patient at [phone].  Other med metformin is being filled by pharmacy today.  She is using [facility] [address] in [place].  Due to her insurance company.

## 2018-04-06 NOTE — Patient Instructions (Signed)
It was a pleasure to see you today!  To summarize our discussion for this visit:  We will be checking your blood for kidney health, diabetes, and cholesterol  We are starting you on one blood pressure medication  We are starting you on one diabetes medication  We are starting you on one medication for foot nerve pain  Some additional health maintenance measures we should update are: Heather [name] Let's discuss increased activity level and diet at next visit . You are also due for several health maintenance screenings . Meet with Ms. Laurance Flatten, our Education officer, museum for resources . Checking your blood again next appointment to see response to medication  Please return to our clinic to see me in 1-2 weeks for follow up.  Call the clinic at [phone] if your symptoms worsen or you have any concerns.   Hypertension Hypertension, commonly called high blood pressure, is when the force of blood pumping through the arteries is too strong. The arteries are the blood vessels that carry blood from the heart throughout the body. Hypertension forces the heart to work harder to pump blood and may cause arteries to become narrow or stiff. Having untreated or uncontrolled hypertension can cause heart attacks, strokes, kidney disease, and other problems. A blood pressure reading consists of a higher number over a lower number. Ideally, your blood pressure should be below 120/80. The first ("top") number is called the systolic pressure. It is a measure of the pressure in your arteries as your heart beats. The second ("bottom") number is called the diastolic pressure. It is a measure of the pressure in your arteries as the heart relaxes. What are the causes? The cause of this condition is not known. What increases the risk? Some risk factors for high blood pressure are under your control. Others are not. Factors you can change  Smoking.  Having type 2 diabetes mellitus, high cholesterol, or both.  Not getting enough  exercise or physical activity.  Being overweight.  Having too much fat, sugar, calories, or salt (sodium) in your diet.  Drinking too much alcohol. Factors that are difficult or impossible to change  Having chronic kidney disease.  Having a family history of high blood pressure.  Age. Risk increases with age.  Race. You may be at higher risk if you are African-American.  Gender. Men are at higher risk than women before age 53. After age 5, women are at higher risk than men.  Having obstructive sleep apnea.  Stress. What are the signs or symptoms? Extremely high blood pressure (hypertensive crisis) may cause:  Headache.  Anxiety.  Shortness of breath.  Nosebleed.  Nausea and vomiting.  Severe chest pain.  Jerky movements you cannot control (seizures). How is this diagnosed? This condition is diagnosed by measuring your blood pressure while you are seated, with your arm resting on a surface. The cuff of the blood pressure monitor will be placed directly against the skin of your upper arm at the level of your heart. It should be measured at least twice using the same arm. Certain conditions can cause a difference in blood pressure between your right and left arms. Certain factors can cause blood pressure readings to be lower or higher than normal (elevated) for a short period of time:  When your blood pressure is higher when you are in a health care provider's office than when you are at home, this is called white coat hypertension. Most people with this condition do not need medicines.  When your  blood pressure is higher at home than when you are in a health care provider's office, this is called masked hypertension. Most people with this condition may need medicines to control blood pressure. If you have a high blood pressure reading during one visit or you have normal blood pressure with other risk factors:  You may be asked to return on a different day to have your  blood pressure checked again.  You may be asked to monitor your blood pressure at home for 1 week or longer. If you are diagnosed with hypertension, you may have other blood or imaging tests to help your health care provider understand your overall risk for other conditions. How is this treated? This condition is treated by making healthy lifestyle changes, such as eating healthy foods, exercising more, and reducing your alcohol intake. Your health care provider may prescribe medicine if lifestyle changes are not enough to get your blood pressure under control, and if:  Your systolic blood pressure is above 130.  Your diastolic blood pressure is above 80. Your personal target blood pressure may vary depending on your medical conditions, your age, and other factors. Follow these instructions at home: Eating and drinking   Eat a diet that is high in fiber and potassium, and low in sodium, added sugar, and fat. An example eating plan is called the DASH (Dietary Approaches to Stop Hypertension) diet. To eat this way: ? Eat plenty of fresh fruits and vegetables. Try to fill half of your plate at each meal with fruits and vegetables. ? Eat whole grains, such as whole wheat pasta, brown rice, or whole grain bread. Fill about one quarter of your plate with whole grains. ? Eat or drink low-fat dairy products, such as skim milk or low-fat yogurt. ? Avoid fatty cuts of meat, processed or cured meats, and poultry with skin. Fill about one quarter of your plate with lean proteins, such as fish, chicken without skin, beans, eggs, and tofu. ? Avoid premade and processed foods. These tend to be higher in sodium, added sugar, and fat.  Reduce your daily sodium intake. Most people with hypertension should eat less than 1,500 mg of sodium a day.  Limit alcohol intake to no more than 1 drink a day for nonpregnant women and 2 drinks a day for men. One drink equals 12 oz of beer, 5 oz of wine, or 1 oz of hard  liquor. Lifestyle   Work with your health care provider to maintain a healthy body weight or to lose weight. Ask what an ideal weight is for you.  Get at least 30 minutes of exercise that causes your heart to beat faster (aerobic exercise) most days of the week. Activities may include walking, swimming, or biking.  Include exercise to strengthen your muscles (resistance exercise), such as pilates or lifting weights, as part of your weekly exercise routine. Try to do these types of exercises for 30 minutes at least 3 days a week.  Do not use any products that contain nicotine or tobacco, such as cigarettes and e-cigarettes. If you need help quitting, ask your health care provider.  Monitor your blood pressure at home as told by your health care provider.  Keep all follow-up visits as told by your health care provider. This is important. Medicines  Take over-the-counter and prescription medicines only as told by your health care provider. Follow directions carefully. Blood pressure medicines must be taken as prescribed.  Do not skip doses of blood pressure medicine. Doing  this puts you at risk for problems and can make the medicine less effective.  Ask your health care provider about side effects or reactions to medicines that you should watch for. Contact a health care provider if:  You think you are having a reaction to a medicine you are taking.  You have headaches that keep coming back (recurring).  You feel dizzy.  You have swelling in your ankles.  You have trouble with your vision. Get help right away if:  You develop a severe headache or confusion.  You have unusual weakness or numbness.  You feel faint.  You have severe pain in your chest or abdomen.  You vomit repeatedly.  You have trouble breathing. Summary  Hypertension is when the force of blood pumping through your arteries is too strong. If this condition is not controlled, it may put you at risk for  serious complications.  Your personal target blood pressure may vary depending on your medical conditions, your age, and other factors. For most people, a normal blood pressure is less than 120/80.  Hypertension is treated with lifestyle changes, medicines, or a combination of both. Lifestyle changes include weight loss, eating a healthy, low-sodium diet, exercising more, and limiting alcohol. This information is not intended to replace advice given to you by your health care provider. Make sure you discuss any questions you have with your health care provider. Document Released: 02/11/2005 Document Revised: 01/10/2016 Document Reviewed: 01/10/2016 Elsevier Interactive Patient Education  2019 [place] you for allowing me to take part in your care,  Dr. Doristine Mango   Thanks for choosing [facility] for your primary care.
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
## 2018-04-07 ENCOUNTER — Telehealth: Payer: Self-pay | Admitting: Licensed Clinical Social Worker

## 2018-04-07 LAB — COMPREHENSIVE METABOLIC PANEL
ALBUMIN: 3.9 g/dL (ref 3.8–4.8)
ALK PHOS: 109 IU/L (ref 39–117)
ALT: 17 IU/L (ref 0–32)
AST: 11 IU/L (ref 0–40)
Albumin/Globulin Ratio: 1.2 (ref 1.2–2.2)
BILIRUBIN TOTAL: 0.4 mg/dL (ref 0.0–1.2)
BUN/Creatinine Ratio: 11 — ABNORMAL LOW (ref 12–28)
BUN: 10 mg/dL (ref 8–27)
CHLORIDE: 102 mmol/L (ref 96–106)
CO2: 23 mmol/L (ref 20–29)
Calcium: 10 mg/dL (ref 8.7–10.3)
Creatinine, Ser: 0.91 mg/dL (ref 0.57–1.00)
GFR calc Af Amer: 76 mL/min/{1.73_m2} (ref >59)
GFR calc non Af Amer: 65 mL/min/{1.73_m2} (ref >59)
Globulin, Total: 3.3 g/dL (ref 1.5–4.5)
Glucose: 339 mg/dL — ABNORMAL HIGH (ref 65–99)
POTASSIUM: 4.5 mmol/L (ref 3.5–5.2)
SODIUM: 139 mmol/L (ref 134–144)
Total Protein: 7.2 g/dL (ref 6.0–8.5)

## 2018-04-07 LAB — PROTEIN / CREATININE RATIO, URINE
CREATININE, UR: 173.5 mg/dL
PROTEIN UR: 123.7 mg/dL
Protein/Creat Ratio: 713 mg/g creat — ABNORMAL HIGH (ref 0–200)

## 2018-04-07 LAB — LIPID PANEL
CHOL/HDL RATIO: 3.9 ratio (ref 0.0–4.4)
Cholesterol, Total: 231 mg/dL — ABNORMAL HIGH (ref 100–199)
HDL: 60 mg/dL (ref >39)
LDL Calculated: 148 mg/dL — ABNORMAL HIGH (ref 0–99)
Triglycerides: 116 mg/dL (ref 0–149)
VLDL CHOLESTEROL CAL: 23 mg/dL (ref 5–40)

## 2018-04-07 NOTE — Telephone Encounter (Signed)
   Outreach Note  04/07/2018 Name: Heather [name] MRN: [id number] DOB: 1950-09-12  An unsuccessful telephone outreach to patient was attempted today. Patient referred by Dr. Ouida Sills for assistance with community resources  Follow Up Plan: LCSW will call again in about 2 to 3 business days   Deborah Moore, [place]   [phone] 11:41 AM

## 2018-04-07 NOTE — Assessment & Plan Note (Addendum)
Very poorly controlled. hgb A1c 12.6 today Started patient on 500 daily metformin initially and will work up as tolerated Follow up in 1 week and will discuss initiating another glucose lowering agent Patient has neuropathy, frequency, and increased thirst. Prescribed gabapentin 100mg  QHS and can increase as needed Diabetic foot exam performed today with decreased sensation and difficult to palpate pulses, no active lesions. Urine protein-creatinine ratio pending

## 2018-04-07 NOTE — Assessment & Plan Note (Signed)
Blood pressure very poorly controlled in office today. 194/100 Has not been taking any medications or checking BP for >6 months Ordered CMP for baseline function Ordered lisinopril-hydrochlorothiazide 20-25 to start initially Will follow up in 1 week for BP check and repeat BMP.

## 2018-04-08 ENCOUNTER — Encounter: Payer: Self-pay | Admitting: Licensed Clinical Social Worker

## 2018-04-08 NOTE — BH Specialist Note (Signed)
   Telephone Outreach Note  04/08/2018 Name: Heather [name] MRN: [id number] DOB: 05/03/1950  Referred by: PCP, Dr.Anderson Reason for referral : Community Resources   I reached out to Heather [name] today by phone in response to a referral sent by PCP.  Current Barriers:  . Financial constraints, Limited access to food and Housing barriers  Patient is currently living with a family member ( 8 people in one home.)  She is working with DSS to get custody of two children.  Most of her income goes to a car payment, not eligible for food benefits at this time but working with caseworker at [facility] for any assistance prior to getting the children. The only other agency she has contact is [employer].  Discussed referral process for [employer], and ability to send information directly to patient and text message sent for consent. Patient provided verbal consent to send text. Interventions: Client interviewed and appropriate assessments performed. Collaborated with [employer] for Food box program information shared with patient for housing resources via [employer] [facility] [employer], and [employer]). Goal:  Obtain Housing for self and 2 children, access to food. Plan:  1. LCSW will send text consent, If consent is not received from text will forward resources to patient to contact. 2. LCSW will leave food box application for patient to complete when she come in on Monday for next appointment with PCP.  3. Patient will contact LCSW if she has questions  Richarda Osmond, DO has been notified of this outreach and Heather [name]'s decision and plan.   Casimer Lanius, [facility]   [phone] 9:37 AM

## 2018-04-13 ENCOUNTER — Encounter: Payer: Self-pay | Admitting: Student in an Organized Health Care Education/Training Program

## 2018-04-13 ENCOUNTER — Telehealth: Payer: Self-pay | Admitting: Student in an Organized Health Care Education/Training Program

## 2018-04-13 ENCOUNTER — Other Ambulatory Visit: Payer: Self-pay | Admitting: Student in an Organized Health Care Education/Training Program

## 2018-04-13 ENCOUNTER — Ambulatory Visit (INDEPENDENT_AMBULATORY_CARE_PROVIDER_SITE_OTHER): Payer: Medicare HMO | Admitting: Student in an Organized Health Care Education/Training Program

## 2018-04-13 VITALS — BP 160/100 | HR 72 | Temp 97.6°F | Wt 196.2 lb

## 2018-04-13 DIAGNOSIS — E1169 Type 2 diabetes mellitus with other specified complication: Secondary | ICD-10-CM | POA: Diagnosis not present

## 2018-04-13 DIAGNOSIS — Z1239 Encounter for other screening for malignant neoplasm of breast: Secondary | ICD-10-CM | POA: Insufficient documentation

## 2018-04-13 DIAGNOSIS — E782 Mixed hyperlipidemia: Secondary | ICD-10-CM

## 2018-04-13 DIAGNOSIS — Z594 Lack of adequate food and safe drinking water: Secondary | ICD-10-CM | POA: Diagnosis not present

## 2018-04-13 DIAGNOSIS — Z23 Encounter for immunization: Secondary | ICD-10-CM

## 2018-04-13 DIAGNOSIS — E11628 Type 2 diabetes mellitus with other skin complications: Secondary | ICD-10-CM | POA: Diagnosis not present

## 2018-04-13 DIAGNOSIS — Z0000 Encounter for general adult medical examination without abnormal findings: Secondary | ICD-10-CM

## 2018-04-13 DIAGNOSIS — Z5941 Food insecurity: Secondary | ICD-10-CM

## 2018-04-13 DIAGNOSIS — E785 Hyperlipidemia, unspecified: Secondary | ICD-10-CM | POA: Insufficient documentation

## 2018-04-13 DIAGNOSIS — I10 Essential (primary) hypertension: Secondary | ICD-10-CM | POA: Diagnosis not present

## 2018-04-13 DIAGNOSIS — E1140 Type 2 diabetes mellitus with diabetic neuropathy, unspecified: Secondary | ICD-10-CM

## 2018-04-13 DIAGNOSIS — Z1159 Encounter for screening for other viral diseases: Secondary | ICD-10-CM

## 2018-04-13 DIAGNOSIS — R69 Illness, unspecified: Secondary | ICD-10-CM | POA: Diagnosis not present

## 2018-04-13 DIAGNOSIS — IMO0002 Reserved for concepts with insufficient information to code with codable children: Secondary | ICD-10-CM

## 2018-04-13 DIAGNOSIS — G629 Polyneuropathy, unspecified: Secondary | ICD-10-CM

## 2018-04-13 DIAGNOSIS — E1165 Type 2 diabetes mellitus with hyperglycemia: Secondary | ICD-10-CM

## 2018-04-13 DIAGNOSIS — Z794 Long term (current) use of insulin: Secondary | ICD-10-CM

## 2018-04-13 HISTORY — DX: Encounter for other screening for malignant neoplasm of breast: Z12.39

## 2018-04-13 HISTORY — DX: Food insecurity: Z59.41

## 2018-04-13 HISTORY — DX: Encounter for screening for other viral diseases: Z11.59

## 2018-04-13 HISTORY — DX: Encounter for general adult medical examination without abnormal findings: Z00.00

## 2018-04-13 MED ORDER — ONETOUCH DELICA LANCETS 33G MISC: 100.0000 [IU] | 3 refills | Status: DC

## 2018-04-13 MED ORDER — BASAGLAR KWIKPEN 100 UNIT/ML ~~LOC~~ SOPN: 10.0000 [IU] | PEN_INJECTOR | Freq: Every day | SUBCUTANEOUS | 0 refills | Status: DC

## 2018-04-13 MED ORDER — AMLODIPINE BESYLATE 10 MG PO TABS: 10.0000 mg | ORAL_TABLET | Freq: Every day | ORAL | 0 refills | Status: DC

## 2018-04-13 MED ORDER — ONETOUCH DELICA LANCING DEV MISC: 1.0000 | Freq: Once | 0 refills | Status: DC

## 2018-04-13 MED ORDER — ATORVASTATIN CALCIUM 40 MG PO TABS: 40.0000 mg | ORAL_TABLET | Freq: Every day | ORAL | 3 refills | Status: DC

## 2018-04-13 MED ORDER — GABAPENTIN 300 MG PO CAPS: 300.0000 mg | ORAL_CAPSULE | Freq: Every day | ORAL | 0 refills | Status: DC

## 2018-04-13 MED ORDER — METFORMIN HCL 500 MG PO TABS: 500.0000 mg | ORAL_TABLET | Freq: Two times a day (BID) | ORAL | 0 refills | Status: DC

## 2018-04-13 MED ORDER — INSULIN GLARGINE 100 UNIT/ML SOLOSTAR PEN: 10.0000 [IU] | PEN_INJECTOR | Freq: Every day | SUBCUTANEOUS | 99 refills | Status: DC

## 2018-04-13 MED ORDER — GLUCOSE BLOOD VI STRP: ORAL_STRIP | 12 refills | Status: DC

## 2018-04-13 MED ORDER — ONETOUCH VERIO IQ SYSTEM W/DEVICE KIT: 1.0000 | PACK | Freq: Once | 0 refills | Status: DC

## 2018-04-13 NOTE — Telephone Encounter (Signed)
Patient stopped by because her lantus was going to cost her $400.  I contacted [facility] and lantus is not on patients formulary but basaglar is.    Reached out to MD, she will change and resent.  Pt appreciative. Nelson Julson, [facility] Spotted, CMA

## 2018-04-13 NOTE — Assessment & Plan Note (Addendum)
Improved but still elevated at 160/100 Continue lisinopril-HCTZ 20-25 QHS Prescribed amlodipine 10mg  QHS Checking BMP today to monitor electrolyte and renal response to medications Return visit in 2-3 weeks

## 2018-04-13 NOTE — Assessment & Plan Note (Signed)
Screening hep C today

## 2018-04-13 NOTE — Assessment & Plan Note (Signed)
Lipid panel 231 total and elevated LDL Started atorvastatin 40mg  daily today Discussed indications/benefits/adverse effects to look out for

## 2018-04-13 NOTE — Telephone Encounter (Signed)
Patient went to pick up her meds today and she cannot afford the

## 2018-04-13 NOTE — Patient Instructions (Addendum)
It was a pleasure to see you today!   To summarize our discussion for this visit:  You are doing great! You have even lost 4lbs since our last visit. Continue to add in vegetables to your diet whenever you can.  Increase metformin to 1,000mg  per day. So you can take your 500mg  pill in the morning and another at night. This is for diabetes  Increase your gabapentin to 300mg  before bedtime. This is for nerve pain.  We are adding atorvostatin 40mg  daily. This is for lowering your cholesterol and improving your heart health.  We are adding insulin today. 10 units of lantus at bedtime to start. I want you to check your fasting glucose (morning blood sugar level) every morning before you eat anything and write down that number every day to show me at our next appointment. We may change your insulin dose so it is important to know what those numbers are.   If your blood sugar is less than 70 in the morning, decrease your insulin to only 5 units at night and let me know about this ASAP.  Your blood pressure looks much better today! I'm so glad you're taking step to improve your health. I'd like to get it a little better controlled. So, today, I'd like to restart your amlodipine medication as well to help lower your blood pressure.  Please call to schedule your mammogram and DEXA scan  If you can recall where you got your colonoscopy, please let me know so we can request those records.   You are getting your pneumonia vaccine today to prevent infection  You are filling out a form for food box resources and can meet with Ms. Moore in the future if you need more assistance or just want to talk  We are checking your blood work today to make sure your electrolytes are ok with the new medications.  Let's schedule an annual wellness visit with our nurse so you can get even more great health coaching  Some additional health maintenance measures we should update are: Marland Kitchen A diabetic eye exam   Please  return to our clinic to see me in early March for follow up. Call the clinic at [phone] if your symptoms worsen or you have any concerns.  Thank you for allowing me to take part in your care,  Dr. Doristine Mango   Thanks for choosing [facility] for your primary care.  Insulin Glargine injection What is this medicine? INSULIN GLARGINE (IN su lin GLAR geen) is a human-made form of insulin. This drug lowers the amount of sugar in your blood. It is a long-acting insulin that is usually given once a day. This medicine may be used for other purposes; ask your health care provider or pharmacist if you have questions. COMMON BRAND NAME(S): BASAGLAR, Lantus, Lantus SoloStar, Toujeo Max SoloStar, Toujeo SoloStar What should I tell my health care provider before I take this medicine? They need to know if you have any of these conditions: -episodes of low blood sugar -eye disease, vision problems -kidney disease -liver disease -an unusual or allergic reaction to insulin, metacresol, other medicines, foods, dyes, or preservatives -pregnant or trying to get pregnant -breast-feeding How should I use this medicine? This medicine is for injection under the skin. Use this medicine at the same time each day. Use exactly as directed. This insulin should never be mixed in the same syringe with other insulins before injection. Do not vigorously shake before use. You will be  taught how to use this medicine and how to adjust doses for activities and illness. Do not use more insulin than prescribed. Always check the appearance of your insulin before using it. This medicine should be clear and colorless like water. Do not use it if it is cloudy, thickened, colored, or has solid particles in it. If you use an insulin pen, be sure to take off the outer needle cover before using the dose. It is important that you put your used needles and syringes in a special sharps container. Do not put them in a  trash can. If you do not have a sharps container, call your pharmacist or healthcare provider to get one. Talk to your pediatrician regarding the use of this medicine in children. Special care may be needed. Overdosage: If you think you have taken too much of this medicine contact a poison control center or emergency room at once. NOTE: This medicine is only for you. Do not share this medicine with others. What if I miss a dose? It is important not to miss a dose. Your health care professional or doctor should discuss a plan for missed doses with you. If you do miss a dose, follow their plan. Do not take double doses. What may interact with this medicine? -other medicines for diabetes Many medications may cause changes in blood sugar, these include: -alcohol containing beverages -antiviral medicines for HIV or AIDS -aspirin and aspirin-like drugs -certain medicines for blood pressure, heart disease, irregular heart beat -chromium -diuretics -female hormones, such as estrogens or progestins, birth control pills -fenofibrate -gemfibrozil -isoniazid -lanreotide -female hormones or anabolic steroids -MAOIs like Carbex, Eldepryl, Marplan, Nardil, and Parnate -medicines for weight loss -medicines for allergies, asthma, cold, or cough -medicines for depression, anxiety, or psychotic disturbances -niacin -nicotine -NSAIDs, medicines for pain and inflammation, like ibuprofen or naproxen -octreotide -pasireotide -pentamidine -phenytoin -probenecid -quinolone antibiotics such as ciprofloxacin, levofloxacin, ofloxacin -some herbal dietary supplements -steroid medicines such as prednisone or cortisone -sulfamethoxazole; trimethoprim -thyroid hormones Some medications can hide the warning symptoms of low blood sugar (hypoglycemia). You may need to monitor your blood sugar more closely if you are taking one of these medications. These include: -beta-blockers, often used for high blood pressure  or heart problems (examples include atenolol, metoprolol, propranolol) -clonidine -guanethidine -reserpine This list may not describe all possible interactions. Give your health care provider a list of all the medicines, herbs, non-prescription drugs, or dietary supplements you use. Also tell them if you smoke, drink alcohol, or use illegal drugs. Some items may interact with your medicine. What should I watch for while using this medicine? Visit your health care professional or doctor for regular checks on your progress. Do not drive, use machinery, or do anything that needs mental alertness until you know how this medicine affects you. Alcohol may interfere with the effect of this medicine. Avoid alcoholic drinks. A test called the HbA1C (A1C) will be monitored. This is a simple blood test. It measures your blood sugar control over the last 2 to 3 months. You will receive this test every 3 to 6 months. Learn how to check your blood sugar. Learn the symptoms of low and high blood sugar and how to manage them. Always carry a quick-source of sugar with you in case you have symptoms of low blood sugar. Examples include hard sugar candy or glucose tablets. Make sure others know that you can choke if you eat or drink when you develop serious symptoms of low blood sugar,  such as seizures or unconsciousness. They must get medical help at once. Tell your doctor or health care professional if you have high blood sugar. You might need to change the dose of your medicine. If you are sick or exercising more than usual, you might need to change the dose of your medicine. Do not skip meals. Ask your doctor or health care professional if you should avoid alcohol. Many nonprescription cough and cold products contain sugar or alcohol. These can affect blood sugar. Make sure that you have the right kind of syringe for the type of insulin you use. Try not to change the brand and type of insulin or syringe unless your  health care professional or doctor tells you to. Switching insulin brand or type can cause dangerously high or low blood sugar. Always keep an extra supply of insulin, syringes, and needles on hand. Use a syringe one time only. Throw away syringe and needle in a closed container to prevent accidental needle sticks. Insulin pens and cartridges should never be shared. Even if the needle is changed, sharing may result in passing of viruses like hepatitis or HIV. Each time you get a new box of pen needles, check to see if they are the same type as the ones you were trained to use. If not, ask your health care professional to show you how to use this new type properly. Wear a medical ID bracelet or chain, and carry a card that describes your disease and details of your medicine and dosage times. What side effects may I notice from receiving this medicine? Side effects that you should report to your doctor or health care professional as soon as possible: -allergic reactions like skin rash, itching or hives, swelling of the face, lips, or tongue -breathing problems -signs and symptoms of high blood sugar such as dizziness, dry mouth, dry skin, fruity breath, nausea, stomach pain, increased hunger or thirst, increased urination -signs and symptoms of low blood sugar such as feeling anxious, confusion, dizziness, increased hunger, unusually weak or tired, sweating, shakiness, cold, irritable, headache, blurred vision, fast heartbeat, loss of consciousness Side effects that usually do not require medical attention (report to your doctor or health care professional if they continue or are bothersome): -increase or decrease in fatty tissue under the skin due to overuse of a particular injection site -itching, burning, swelling, or rash at site where injected This list may not describe all possible side effects. Call your doctor for medical advice about side effects. You may report side effects to FDA at  1-800-FDA-1088. Where should I keep my medicine? Keep out of the reach of children. Store unopened vials in a refrigerator between 2 and 8 degrees C (36 and 46 degrees F). Do not freeze or use if the insulin has been frozen. Opened vials (vials currently in use) may be stored in the refrigerator or at room temperature, at approximately 25 degrees C (77 degrees F) or cooler. Keeping your insulin at room temperature decreases the amount of pain during injection. Once opened, your insulin can be used for 28 days. After 28 days, the vial should be thrown away. Store Lantus [employer], mining in a refrigerator between 2 and 8 degrees C (36 and 46 degrees F) or at room temperature below 30 degrees C (86 degrees F). Do not freeze or use if the insulin has been frozen. Once opened, the pens should be kept at room temperature. Do not store in the refrigerator once opened. Once  opened, the insulin can be used for 28 days. After 28 days, the Lantus Solostar Pen or Basaglar KwikPen should be thrown away. Store Toujeo and [employer] in a refrigerator between 2 and 8 degrees C (36 and 46 degrees F). Do not freeze or use if the insulin has been frozen. Once opened, the pens should be kept at room temperature below 30 degrees C (86 degrees F). Do not store in the refrigerator once opened. Once opened, the insulin can be used for 56 days. After 56 days, the Toujeo or [employer] Pen should be thrown away. Protect from light and excessive heat. Throw away any unused medicine after the expiration date or after the specified time for room temperature storage has passed. NOTE: This sheet is a summary. It may not cover all possible information. If you have questions about this medicine, talk to your doctor, pharmacist, or health care provider.  2019 Elsevier/Gold Standard (2017-08-14 15:06:27)

## 2018-04-13 NOTE — Addendum Note (Signed)
Addended by: Richarda Osmond on: 04/13/2018 04:41 PM   Modules accepted: Orders

## 2018-04-13 NOTE — Assessment & Plan Note (Addendum)
Improved symptoms thirst and frequency 4 lbs weight loss Tolerating metformin well- Increase to 500mg  BID Initiating lantus 10 units QHS with fasting monitoring am Follow up with pharmacy next week Given hold parameters for insulin with BG <70 and to notify me although I doubt she will have this extreme response. Encouraged to increase vegetable proportions in meals Follow up with me in about 3 weeks as I believe my schedule is full sooner Follow up with [facility] asap Prescribed statin today as well Administered PNA 23 vaccine today, patient already received flu in October, 2019 Will discuss starting trulicity at next F7X check-this is agent best covered by financial assistance for patient

## 2018-04-13 NOTE — Progress Notes (Signed)
Subjective:    Patient ID: Heather [name], female    DOB: Sep 04, 1950, 68 y.o.   MRN: [id number]   CC: f/u diabetes, HTN, HLD, other health maintenance   HPI: Patient states that she is doing well overall. She does not "feel any different". She is starting to get more busy at work.  Diabetes- hgb A1c from last visit 12.6. patient had diarrhea x1 on first day taking metformin but has otherwise been tolerating 500mg  daily well. She will increase to 2x/day. She has been eating a salad every day. She has had decrease in thirst and urine output. Drinking about 3 bottles of water per day (previously 5). Urinating about 1x/night (previously every couple of hours). She is amenable to starting insulin therapy today. She is down 4 lbs since last visit.  HTN- BP 160/100 today. Patient tolerating her lisinopril/HCTZ well. Denies any negative side-effects. Denies chest pain, SOB, orthostatic symptoms. K+ was 4.5, Creatinine 0.91 at last visit. Will monitor BMP again today on new therapy.  HLD- lipid panel abnormal last visit with total cholesterol 231, LDL 148. Patient amenable to starting statin therapy today. Discussed possible side effects and the indications/benefits of therapy.   Due for pneumonia 23 vaccine today. Received flu vaccine Oct 2019.  Mammogram, DEXA scan, hepatitis C screening indicated at this time.  Patient has AWV option and recommended she schedule this.  Smoking status reviewed   ROS: pertinent noted in the HPI   Past Medical History:  Diagnosis Date  . Diabetes mellitus   . Hypertension    Past medical history, surgical, family, and social history reviewed and updated in the EMR as appropriate.  Objective:  BP (!) 160/100   Pulse 72   Temp 97.6 F (36.4 C) (Oral)   Wt 196 lb 3.2 oz (89 kg)   SpO2 96%   BMI 30.73 kg/m   Vitals and nursing note reviewed  General: NAD, pleasant, able to participate in exam Mouth: poor dentition, moist mucous membranes, very mild  speech impediment  Cardiac: RRR, S1 S2 present. normal heart sounds, no murmurs. Respiratory: CTAB, normal effort, No wheezes, rales or rhonchi Extremities: no edema or cyanosis. Skin: warm and dry, no rashes noted Neuro: alert, no obvious focal deficits Psych: Normal affect and mood   Assessment & Plan:    HTN (hypertension) Improved but still elevated at 160/100 Continue lisinopril-HCTZ 20-25 QHS Prescribed amlodipine 10mg  QHS Checking BMP today to monitor electrolyte and renal response to medications Return visit in 2-3 weeks  Uncontrolled diabetes mellitus with diabetic neuropathy, with long-term current use of insulin (HCC) Improved symptoms thirst and frequency 4 lbs weight loss Tolerating metformin well- Increase to 500mg  BID Initiating lantus 10 units QHS with fasting monitoring am Follow up with pharmacy next week Given hold parameters for insulin with BG <70 and to notify me although I doubt she will have this extreme response. Encouraged to increase vegetable proportions in meals Follow up with me in about 3 weeks as I believe my schedule is full sooner Follow up with AWV asap Prescribed statin today as well Administered PNA 23 vaccine today, patient already received flu in October, 2019 Will discuss starting trulicity at next D6Q check-this is agent best covered by financial assistance for patient   Neuropathy ([place]) Low dose gabapentin did not improve symptoms, no negative side effects reported. Taking at bed time Discussed best treatment for peripheral neuropathy is to get BG under control Increase to 300mg  QHS Recheck at next appointment  HLD (hyperlipidemia) Lipid panel 231 total and elevated LDL Started atorvastatin 40mg  daily today Discussed indications/benefits/adverse effects to look out for  Screening for breast cancer Ordered mammogram and dexa scan and gave patient handout with scheduling information  Encounter for hepatitis C screening test for  low risk patient Screening hep C today  Food insecurity Patient completed food box application today- placed on Ms. Moore's desk Patient is working with external case manager currently for gaining custody of family members so will wait to contact ms. Moore until she has more concrete information.  We agreed to discuss this issue further at next appointment  Medicare annual wellness visit, initial Recommended patient make AWV appointment  Patient received PNA 23 vaccine today. Patient was given form to request records from previous PCP. Would like to see colonoscopy records- patient states she had done in 2017 and was normal.  For next visit, on top of following up on chronic dz management, I would also like to discuss possible dental visit and recommend patient get an eye exam.   Doristine Mango, [place] PGY-1

## 2018-04-13 NOTE — Assessment & Plan Note (Signed)
Ordered mammogram and dexa scan and gave patient handout with scheduling information

## 2018-04-13 NOTE — Assessment & Plan Note (Signed)
Recommended patient make AWV appointment

## 2018-04-13 NOTE — Assessment & Plan Note (Signed)
Patient completed food box application today- placed on Ms. Moore's desk Patient is working with external case manager currently for gaining custody of family members so will wait to contact ms. Moore until she has more concrete information.  We agreed to discuss this issue further at next appointment

## 2018-04-13 NOTE — Assessment & Plan Note (Signed)
Low dose gabapentin did not improve symptoms, no negative side effects reported. Taking at bed time Discussed best treatment for peripheral neuropathy is to get BG under control Increase to 300mg  QHS Recheck at next appointment

## 2018-04-14 ENCOUNTER — Encounter: Payer: Self-pay | Admitting: Licensed Clinical Social Worker

## 2018-04-14 ENCOUNTER — Ambulatory Visit (INDEPENDENT_AMBULATORY_CARE_PROVIDER_SITE_OTHER): Payer: Medicare HMO

## 2018-04-14 ENCOUNTER — Other Ambulatory Visit: Payer: Self-pay

## 2018-04-14 ENCOUNTER — Other Ambulatory Visit: Payer: Self-pay | Admitting: Student in an Organized Health Care Education/Training Program

## 2018-04-14 VITALS — BP 158/100 | HR 77 | Temp 97.8°F | Ht 67.0 in | Wt 197.0 lb

## 2018-04-14 DIAGNOSIS — E1140 Type 2 diabetes mellitus with diabetic neuropathy, unspecified: Secondary | ICD-10-CM | POA: Diagnosis not present

## 2018-04-14 DIAGNOSIS — E1165 Type 2 diabetes mellitus with hyperglycemia: Secondary | ICD-10-CM

## 2018-04-14 DIAGNOSIS — IMO0002 Reserved for concepts with insufficient information to code with codable children: Secondary | ICD-10-CM

## 2018-04-14 DIAGNOSIS — Z794 Long term (current) use of insulin: Secondary | ICD-10-CM | POA: Diagnosis not present

## 2018-04-14 DIAGNOSIS — E11628 Type 2 diabetes mellitus with other skin complications: Secondary | ICD-10-CM

## 2018-04-14 DIAGNOSIS — Z13820 Encounter for screening for osteoporosis: Secondary | ICD-10-CM

## 2018-04-14 DIAGNOSIS — Z0000 Encounter for general adult medical examination without abnormal findings: Secondary | ICD-10-CM

## 2018-04-14 LAB — BASIC METABOLIC PANEL
BUN/Creatinine Ratio: 20 (ref 12–28)
BUN: 20 mg/dL (ref 8–27)
CALCIUM: 10.4 mg/dL — AB (ref 8.7–10.3)
CO2: 25 mmol/L (ref 20–29)
Chloride: 97 mmol/L (ref 96–106)
Creatinine, Ser: 0.98 mg/dL (ref 0.57–1.00)
GFR calc Af Amer: 69 mL/min/{1.73_m2} (ref >59)
GFR calc non Af Amer: 60 mL/min/{1.73_m2} (ref >59)
Glucose: 308 mg/dL — ABNORMAL HIGH (ref 65–99)
Potassium: 4.5 mmol/L (ref 3.5–5.2)
Sodium: 139 mmol/L (ref 134–144)

## 2018-04-14 LAB — HCV COMMENT:

## 2018-04-14 LAB — HEPATITIS C ANTIBODY (REFLEX): HCV Ab: <0.1 s/co ratio (ref 0.0–0.9)

## 2018-04-14 MED ORDER — INSULIN GLARGINE 100 UNIT/ML SOLOSTAR PEN: 10.0000 [IU] | PEN_INJECTOR | Freq: Every day | SUBCUTANEOUS | 99 refills | Status: DC

## 2018-04-14 MED ORDER — ONETOUCH VERIO IQ SYSTEM W/DEVICE KIT: 1.0000 | PACK | Freq: Once | 0 refills | Status: AC

## 2018-04-14 NOTE — Telephone Encounter (Signed)
lantus was discontinued and sent in prescription for basaglar instead.
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
## 2018-04-14 NOTE — Patient Instructions (Addendum)
Heather [name] , Thank you for taking time to come for your Medicare Wellness Visit. I appreciate your ongoing commitment to your health goals. Please review the following plan we discussed and let me know if I can assist you in the future.   Please keep your appointment with Dr. Valentina Lucks on 04/17/18.  These are the goals we discussed: Goals    . Patient Stated     Wants to get stable on her medications with her chronic problems of HTN, DM, HLD.       This is a list of the screening recommended for you and due dates:  Health Maintenance  Topic Date Due  . Eye exam for diabetics  07/28/1960  . Mammogram  03/25/2008  . DEXA scan (bone density measurement)  07/29/2015  . Hemoglobin A1C  10/05/2018  . Complete foot exam   04/07/2019  . Pneumonia vaccines (2 of 2 - PCV13) 04/14/2019  . Tetanus Vaccine  02/15/2025  . Colon Cancer Screening  02/25/2025  . Flu Shot  Completed  .  Hepatitis C: One time screening is recommended by Center for Disease Control  (CDC) for  adults born from [birth year] through 1965.   Completed     Diabetes Mellitus and Foot Care Foot care is an important part of your health, especially when you have diabetes. Diabetes may cause you to have problems because of poor blood flow (circulation) to your feet and legs, which can cause your skin to:  Become thinner and drier.  Break more easily.  Heal more slowly.  Peel and crack. You may also have nerve damage (neuropathy) in your legs and feet, causing decreased feeling in them. This means that you may not notice minor injuries to your feet that could lead to more serious problems. Noticing and addressing any potential problems early is the best way to prevent future foot problems. How to care for your feet Foot hygiene  Wash your feet daily with warm water and mild soap. Do not use hot water. Then, pat your feet and the areas between your toes until they are completely dry. Do not soak your feet as this can dry your  skin.  Trim your toenails straight across. Do not dig under them or around the cuticle. File the edges of your nails with an emery board or nail file.  Apply a moisturizing lotion or petroleum jelly to the skin on your feet and to dry, brittle toenails. Use lotion that does not contain alcohol and is unscented. Do not apply lotion between your toes. Shoes and socks  Wear clean socks or stockings every day. Make sure they are not too tight. Do not wear knee-high stockings since they may decrease blood flow to your legs.  Wear shoes that fit properly and have enough cushioning. Always look in your shoes before you put them on to be sure there are no objects inside.  To break in new shoes, wear them for just a few hours a day. This prevents injuries on your feet. Wounds, scrapes, corns, and calluses  Check your feet daily for blisters, cuts, bruises, sores, and redness. If you cannot see the bottom of your feet, use a mirror or ask someone for help.  Do not cut corns or calluses or try to remove them with medicine.  If you find a minor scrape, cut, or break in the skin on your feet, keep it and the skin around it clean and dry. You may clean these areas with mild soap  and water. Do not clean the area with peroxide, alcohol, or iodine.  If you have a wound, scrape, corn, or callus on your foot, look at it several times a day to make sure it is healing and not infected. Check for: ? Redness, swelling, or pain. ? Fluid or blood. ? Warmth. ? Pus or a bad smell. General instructions  Do not cross your legs. This may decrease blood flow to your feet.  Do not use heating pads or hot water bottles on your feet. They may burn your skin. If you have lost feeling in your feet or legs, you may not know this is happening until it is too late.  Protect your feet from hot and cold by wearing shoes, such as at the beach or on hot pavement.  Schedule a complete foot exam at least once a year (annually)  or more often if you have foot problems. If you have foot problems, report any cuts, sores, or bruises to your health care provider immediately. Contact a health care provider if:  You have a medical condition that increases your risk of infection and you have any cuts, sores, or bruises on your feet.  You have an injury that is not healing.  You have redness on your legs or feet.  You feel burning or tingling in your legs or feet.  You have pain or cramps in your legs and feet.  Your legs or feet are numb.  Your feet always feel cold.  You have pain around a toenail. Get help right away if:  You have a wound, scrape, corn, or callus on your foot and: ? You have pain, swelling, or redness that gets worse. ? You have fluid or blood coming from the wound, scrape, corn, or callus. ? Your wound, scrape, corn, or callus feels warm to the touch. ? You have pus or a bad smell coming from the wound, scrape, corn, or callus. ? You have a fever. ? You have a red line going up your leg. Summary  Check your feet every day for cuts, sores, red spots, swelling, and blisters.  Moisturize feet and legs daily.  Wear shoes that fit properly and have enough cushioning.  If you have foot problems, report any cuts, sores, or bruises to your health care provider immediately.  Schedule a complete foot exam at least once a year (annually) or more often if you have foot problems. This information is not intended to replace advice given to you by your health care provider. Make sure you discuss any questions you have with your health care provider. Document Released: 02/09/2000 Document Revised: 03/26/2017 Document Reviewed: 03/15/2016 Elsevier Interactive Patient Education  2019 [place] Prevention in the Home, Adult Falls can cause injuries. They can happen to people of all ages. There are many things you can do to make your home safe and to help prevent falls. Ask for help when  making these changes, if needed. What actions can I take to prevent falls? General Instructions  Use good lighting in all rooms. Replace any light bulbs that burn out.  Turn on the lights when you go into a dark area. Use night-lights.  Keep items that you use often in easy-to-reach places. Lower the shelves around your home if necessary.  Set up your furniture so you have a clear path. Avoid moving your furniture around.  Do not have throw rugs and other things on the floor that can make you trip.  Avoid walking on wet floors.  If any of your floors are uneven, fix them.  Add color or contrast paint or tape to clearly mark and help you see: ? Any grab bars or handrails. ? First and last steps of stairways. ? Where the edge of each step is.  If you use a stepladder: ? Make sure that it is fully opened. Do not climb a closed stepladder. ? Make sure that both sides of the stepladder are locked into place. ? Ask someone to hold the stepladder for you while you use it.  If there are any pets around you, be aware of where they are. What can I do in the bathroom?      Keep the floor dry. Clean up any water that spills onto the floor as soon as it happens.  Remove soap buildup in the tub or shower regularly.  Use non-skid mats or decals on the floor of the tub or shower.  Attach bath mats securely with double-sided, non-slip rug tape.  If you need to sit down in the shower, use a plastic, non-slip stool.  Install grab bars by the toilet and in the tub and shower. Do not use towel bars as grab bars. What can I do in the bedroom?  Make sure that you have a light by your bed that is easy to reach.  Do not use any sheets or blankets that are too big for your bed. They should not hang down onto the floor.  Have a firm chair that has side arms. You can use this for support while you get dressed. What can I do in the kitchen?  Clean up any spills right away.  If you need to  reach something above you, use a strong step stool that has a grab bar.  Keep electrical cords out of the way.  Do not use floor polish or wax that makes floors slippery. If you must use wax, use non-skid floor wax. What can I do with my stairs?  Do not leave any items on the stairs.  Make sure that you have a light switch at the top of the stairs and the bottom of the stairs. If you do not have them, ask someone to add them for you.  Make sure that there are handrails on both sides of the stairs, and use them. Fix handrails that are broken or loose. Make sure that handrails are as long as the stairways.  Install non-slip stair treads on all stairs in your home.  Avoid having throw rugs at the top or bottom of the stairs. If you do have throw rugs, attach them to the floor with carpet tape.  Choose a carpet that does not hide the edge of the steps on the stairway.  Check any carpeting to make sure that it is firmly attached to the stairs. Fix any carpet that is loose or worn. What can I do on the outside of my home?  Use bright outdoor lighting.  Regularly fix the edges of walkways and driveways and fix any cracks.  Remove anything that might make you trip as you walk through a door, such as a raised step or threshold.  Trim any bushes or trees on the path to your home.  Regularly check to see if handrails are loose or broken. Make sure that both sides of any steps have handrails.  Install guardrails along the edges of any raised decks and porches.  Clear walking paths of anything that  might make someone trip, such as tools or rocks.  Have any leaves, snow, or ice cleared regularly.  Use sand or salt on walking paths during winter.  Clean up any spills in your garage right away. This includes grease or oil spills. What other actions can I take?  Wear shoes that: ? Have a low heel. Do not wear high heels. ? Have rubber bottoms. ? Are comfortable and fit you well. ? Are  closed at the toe. Do not wear open-toe sandals.  Use tools that help you move around (mobility aids) if they are needed. These include: ? Canes. ? Walkers. ? Scooters. ? Crutches.  Review your medicines with your doctor. Some medicines can make you feel dizzy. This can increase your chance of falling. Ask your doctor what other things you can do to help prevent falls. Where to find more information  Centers for Disease Control and Prevention, STEADI: https://garcia.biz/  [employer] on Aging: BrainJudge.co.uk Contact a doctor if:  You are afraid of falling at home.  You feel weak, drowsy, or dizzy at home.  You fall at home. Summary  There are many simple things that you can do to make your home safe and to help prevent falls.  Ways to make your home safe include removing tripping hazards and installing grab bars in the bathroom.  Ask for help when making these changes in your home. This information is not intended to replace advice given to you by your health care provider. Make sure you discuss any questions you have with your health care provider. Document Released: 12/08/2008 Document Revised: 09/26/2016 Document Reviewed: 09/26/2016 Elsevier Interactive Patient Education  2019 [employer].

## 2018-04-14 NOTE — Progress Notes (Signed)
Patient completed Food box application during office visit with PCP 04/13/2018 LCSW reviewed completed application and faxed it to [facility].   Patient will pick up 1st box  Feb. 25th or March 10th based on her schedule.  Patient in RN clinic today. LCSW stopped by to introduce self.  RN also provided patient with hard copies of community resources during her visit.  Casimer Lanius, [place]   [phone] 10:02 AM

## 2018-04-17 ENCOUNTER — Ambulatory Visit (INDEPENDENT_AMBULATORY_CARE_PROVIDER_SITE_OTHER): Payer: Medicare HMO | Admitting: Pharmacist

## 2018-04-17 ENCOUNTER — Encounter: Payer: Self-pay | Admitting: Pharmacist

## 2018-04-17 ENCOUNTER — Ambulatory Visit (INDEPENDENT_AMBULATORY_CARE_PROVIDER_SITE_OTHER): Payer: Medicare HMO | Admitting: Licensed Clinical Social Worker

## 2018-04-17 ENCOUNTER — Ambulatory Visit (INDEPENDENT_AMBULATORY_CARE_PROVIDER_SITE_OTHER): Payer: Medicare HMO | Admitting: Student in an Organized Health Care Education/Training Program

## 2018-04-17 DIAGNOSIS — E1165 Type 2 diabetes mellitus with hyperglycemia: Secondary | ICD-10-CM | POA: Diagnosis not present

## 2018-04-17 DIAGNOSIS — E1140 Type 2 diabetes mellitus with diabetic neuropathy, unspecified: Secondary | ICD-10-CM | POA: Diagnosis not present

## 2018-04-17 DIAGNOSIS — IMO0002 Reserved for concepts with insufficient information to code with codable children: Secondary | ICD-10-CM

## 2018-04-17 DIAGNOSIS — Z139 Encounter for screening, unspecified: Secondary | ICD-10-CM

## 2018-04-17 DIAGNOSIS — Z794 Long term (current) use of insulin: Secondary | ICD-10-CM

## 2018-04-17 MED ORDER — INSULIN GLARGINE 100 UNIT/ML SOLOSTAR PEN: 22.0000 [IU] | PEN_INJECTOR | Freq: Every day | SUBCUTANEOUS | 99 refills | Status: DC

## 2018-04-17 NOTE — Patient Instructions (Signed)
Nice to meet you today!  Please Take 22 units on Saturday morning.  Increase 2 additional units each morning until your fasting blood sugars (in the morning).  Continue all other medications as prescribed.   Return to clinic next Thursday 2/27 at 9:30 AM
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
## 2018-04-17 NOTE — Progress Notes (Signed)
Patient ID: Heather [name], female   DOB: Jun 13, 1950, 68 y.o.   MRN: [id number] Reviewed: Agree with documentation and management of Dr. Valentina Lucks.
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
## 2018-04-23 ENCOUNTER — Ambulatory Visit (INDEPENDENT_AMBULATORY_CARE_PROVIDER_SITE_OTHER): Payer: Medicare HMO | Admitting: Family Medicine

## 2018-04-23 ENCOUNTER — Encounter: Payer: Self-pay | Admitting: Pharmacist

## 2018-04-23 ENCOUNTER — Ambulatory Visit (INDEPENDENT_AMBULATORY_CARE_PROVIDER_SITE_OTHER): Payer: Medicare HMO | Admitting: Pharmacist

## 2018-04-23 VITALS — BP 164/84 | HR 95

## 2018-04-23 DIAGNOSIS — IMO0002 Reserved for concepts with insufficient information to code with codable children: Secondary | ICD-10-CM

## 2018-04-23 DIAGNOSIS — I10 Essential (primary) hypertension: Secondary | ICD-10-CM | POA: Diagnosis not present

## 2018-04-23 DIAGNOSIS — E782 Mixed hyperlipidemia: Secondary | ICD-10-CM | POA: Diagnosis not present

## 2018-04-23 DIAGNOSIS — I25119 Atherosclerotic heart disease of native coronary artery with unspecified angina pectoris: Secondary | ICD-10-CM

## 2018-04-23 DIAGNOSIS — E1140 Type 2 diabetes mellitus with diabetic neuropathy, unspecified: Secondary | ICD-10-CM

## 2018-04-23 DIAGNOSIS — E11628 Type 2 diabetes mellitus with other skin complications: Secondary | ICD-10-CM | POA: Diagnosis not present

## 2018-04-23 DIAGNOSIS — R0789 Other chest pain: Secondary | ICD-10-CM

## 2018-04-23 DIAGNOSIS — Z794 Long term (current) use of insulin: Secondary | ICD-10-CM

## 2018-04-23 DIAGNOSIS — E1165 Type 2 diabetes mellitus with hyperglycemia: Secondary | ICD-10-CM | POA: Diagnosis not present

## 2018-04-23 MED ORDER — CARVEDILOL 3.125 MG PO TABS: 3.1250 mg | ORAL_TABLET | Freq: Two times a day (BID) | ORAL | 3 refills | Status: DC

## 2018-04-23 MED ORDER — METFORMIN HCL 500 MG PO TABS: 1000.0000 mg | ORAL_TABLET | Freq: Two times a day (BID) | ORAL | 2 refills | Status: DC

## 2018-04-23 MED ORDER — NITROGLYCERIN 0.4 MG SL SUBL: 0.4000 mg | SUBLINGUAL_TABLET | SUBLINGUAL | 3 refills | Status: DC | PRN

## 2018-04-23 NOTE — Progress Notes (Signed)
  Subjective:  Patient ID: Heather [name]  DOB: [birth year] MRN: [id number]  Heather [name] is a 68 year old female is here with PMH significant for mild CAD, poorly controlled T2DM, HLD, peripheral neuropathy here with chest pain.   HPI:  Chest Pain: Patient reports that over the past few months she has noticed some episodes of chest pain which have increased in frequency from 1 time per week to 2-3 times per week.  Reports that her chest pain feels like a tightness across her chest that would last for a few minutes at a time.  She will sit down and states that it just goes away on its own.  States that it does not radiate anywhere.  It is centrally located over her chest.  Patient does report some associated shortness of breath on exertion.  States that she has noticed that she used to be able to walk more than she does now however she does note that she does not walk very much.  States that she does not exercise regularly.  Patient also does not remember hospital encounter where she had heart cath done.  Patient denies any current chest pain or shortness of breath.  Denies any leg swelling.  On chart review: Patient hospitalized in November 2017.  Patient noted to have positive stress test during that hospitalization and a heart cath was performed which showed mild CAD.  Patient was to start Imdur at that time which she reports she never did.  She was also to follow-up with cardiologist which she did not do.  ROS: All other systems otherwise negative, except as mentioned in HPI  Family hx: Mother with history of stroke, MGM with history of stroke.  Father with history of diabetes.  No known family history of CAD or MI's per patient.  Social hx: Denies use of illicit drugs, alcohol use Smoking status reviewed  Patient Active Problem List   Diagnosis Date Noted  . HLD (hyperlipidemia) 04/13/2018  . Food insecurity 04/13/2018  . Medicare annual wellness visit, initial 04/13/2018  . Other chest pain  01/01/2016  . HTN (hypertension) 01/01/2016  . Uncontrolled diabetes mellitus with diabetic neuropathy, with long-term current use of insulin ([place]) 01/01/2016  . Neuropathy 01/01/2016     Objective:  BP (!) 164/84   Pulse 95   SpO2 94%   Vitals and nursing note reviewed  General: NAD, pleasant Cardiac: RRR, normal heart sounds, no m/r/g Pulm: normal effort, CTAB Extremities: no edema or cyanosis. WWP. Skin: warm and dry, no rashes noted Neuro: alert and oriented, no focal deficits Psych: normal affect, normal thought content  Assessment & Plan:   Other chest pain No active chest pain here in office. Patient with hx of mild CAD seen on cath in 2017 and was started on Imdur and told to follow-up with cardiology which patient did not do. No sig fam hx. -continue aspirin, lipitor  -Will start on coreg 3.25 mg BID -sublingual nitro to use for prn chest pain -refer patient to cardiology given sx and history.  -Patient given strict ED return precautions.    [place] Tsur[place]o Murtha, DO Family Medicine Resident PGY-2

## 2018-04-23 NOTE — Patient Instructions (Addendum)
It was nice to see you today!  Continue to increase your Lantus by 2 units each morning until your fasting blood sugars (in the morning) continue to be <180 OR if you see any blood sugars <100. When you get to this point, continue the current insulin dose.   Increase metformin to 1 tablet in the morning and 2 tablet with supper. If you are tolerating this change after 1 week, please increase to 2 tablets in the morning and 2 tablets with supper.  Continue all other medications as prescribed.   Please call the clinic with any questions or concerns you have.  Schedule follow up with Dr. Ouida Sills in 1-2 weeks.

## 2018-04-23 NOTE — Patient Instructions (Signed)
Thank you for coming to see me today. It was a pleasure! Today we talked about:   For your chest pain and shortness of breath we will refer you to a cardiologist.  You should hear from them within the next few days.  I have started you on a medication called carvedilol.  Please take 1 pill twice a day.  I have also sent a medication called nitroglycerin to your pharmacy.  Please place this under your tongue when you are having an episode of chest pain.  You may use this every 5 minutes for up to 3 times if you continue to have chest pain.  If after using 3 of these pills and your chest pain is still present then please go to the emergency room.  Please follow-up as needed.  If you have any questions or concerns, please do not hesitate to call the office at [phone].  Take Care,   Martinique Severo Beber, DO

## 2018-04-23 NOTE — Progress Notes (Signed)
Patient ID: Heather [name], female   DOB: Dec 21, 1950, 68 y.o.   MRN: [id number] Reviewed: Agree with Dr. Graylin Shiver documentation and management.

## 2018-04-23 NOTE — Assessment & Plan Note (Signed)
Diabetes longstanding currently uncontrolled with AM fasting BG averaging ~200-250. Patient is able to verbalize appropriate hypoglycemia management plan. Patient reports adherence with medication.  -Continued basal Lantus (insulin glargine). Patient will continue to titrate 2 units every until fasting CBGs are <180 OR if they begin to be <100.  -Increase metformin to 500mg  in the morning and 1000mg  in the evening. Counseled to increase after 1 week of tolerating to 1000mg  BID.  -Extensively discussed pathophysiology of DM, recommended lifestyle interventions, dietary effects on glycemic control -Counseled on s/sx of and management of hypoglycemia -Counseled on dietary changes, minimizing breads, rice, pasta, potatoes -Next A1C anticipated May 2020.

## 2018-04-23 NOTE — Assessment & Plan Note (Signed)
Hypertension longstanding, reading in-office uncontrolled.  BP goal = 130 mmHg. Patient reports adherence with medication. Control is suboptimal due to recent stress with moving to shelter. -Continue lisinopril/HCTZ 20/25 mg and amlodipine 10 mg

## 2018-04-23 NOTE — Assessment & Plan Note (Signed)
ASCVD risk - primary prevention in patient with DM. Last LDL is not controlled. ASCVD risk score is >20%   -Continued aspirin 81 mg  -Continued atorvastatin 40 mg.

## 2018-04-23 NOTE — Progress Notes (Signed)
S:     Chief Complaint  Patient presents with  . Medication Management    Diabetes    Patient arrives ambulating without assistance and in good spirits.  Presents for diabetes evaluation, education, and management at the request of Dr. Doristine Mango, PCP, referred on 04/13/2018.  Patient reports have been under better control lately but are still variable. She reports taking 32 units of Lantus this morning and continues to titrate 2 units every day until morning fasting CBG <180. She is trying to focus on making better dietary choices, but this is difficult due to living in a shelter. 202 this morning, 232 today in the office. Notes that they are still living in the shelter, and that they will likely be there for a while. Notes that a cousin brought her over pork chops and green beans last night so that she didn't eat the spaghetti that was available at the shelter dinner.   She complains of a dull pain in the center of her chest that is intermittent. She thinks this happens more often when she is walking/on exertion. One day she felt SOB and the pain moved across her chest. She thought it might have been indigestion/gas related. She almost went to the ED but this resolved the next morning. Reports this started sometime last week.   Patient reports adherence with medications.  Current diabetes medications include: Metformin 500 mg BID, Lantus 32 units daily  Current hypertension medications include: lisinopril/HCTZ 20/25 mg QAM and amlodipine 10 mg QHS    Patient denies hypoglycemic events.  -She reports her lowest BG in recent memory was 133  Dietary habits: Eating erratic number of meals/day -Breakfast: Today states she was "bad" - sausage and egg biscuit this morning with a hash brown and a cup of hot chocolate. Due to the fact she is in a shelter her meals are not always scheduled/regular -Dinner: Pork and beans, salad Snacks: Not snacking much -Drinks: Water  Patient reports  improvement in nocturia.  Patient reports neuropathy - denies benefit from gabapentin increased dose   O:  Physical Exam Constitutional:      Appearance: Normal appearance.  Neurological:     Mental Status: She is alert.    Review of Systems  Cardiovascular: Positive for chest pain.  All other systems reviewed and are negative.   Lab Results  Component Value Date   HGBA1C 12.6 (A) 04/06/2018   Vitals:   04/23/18 0941 04/23/18 1010  BP: (!) 142/84 (!) 152/88  Pulse: 92   SpO2: 96%   Home Wrist Cuff: 161/87   Lipid Panel     Component Value Date/Time   CHOL 231 (H) 04/06/2018 1105   TRIG 116 04/06/2018 1105   HDL 60 04/06/2018 1105   CHOLHDL 3.9 04/06/2018 1105   CHOLHDL 4.3 10/16/2006 0353   VLDL 25 10/16/2006 0353   LDLCALC 148 (H) 04/06/2018 1105    Home fasting CBG: 202 this morning; 2 hour post-prandom CBG: 262 today in clinic 7 day average 247  Wrist Cuff: 116/72 this morning around 7 am   Clinical ASCVD: No  The 10-year ASCVD risk score Mikey Bussing DC Jr., et al., 2013) is: 35%   Values used to calculate the score:     Age: 68 years     Sex: Female     Is Non-Hispanic African American: Yes     Diabetic: Yes     Tobacco smoker: No     Systolic Blood Pressure: 683 mmHg  Is BP treated: Yes     HDL Cholesterol: 60 mg/dL     Total Cholesterol: 231 mg/dL    A/P: Diabetes longstanding currently uncontrolled with AM fasting BG averaging ~200-250. Patient is able to verbalize appropriate hypoglycemia management plan. Patient reports adherence with medication.  -Continued basal Lantus (insulin glargine). Patient will continue to titrate 2 units every until fasting CBGs are <180 OR if they begin to be <100.  -Increase metformin to 500mg  in the morning and 1000mg  in the evening. Counseled to increase after 1 week of tolerating to 1000mg  BID.  -Extensively discussed pathophysiology of DM, recommended lifestyle interventions, dietary effects on glycemic  control -Counseled on s/sx of and management of hypoglycemia -Counseled on dietary changes, minimizing breads, rice, pasta, potatoes -Next A1C anticipated May 2020.   ASCVD risk - primary prevention in patient with DM. Last LDL is not controlled. ASCVD risk score is >20%   -Continued aspirin 81 mg  -Continued atorvastatin 40 mg.   Hypertension longstanding, reading in-office uncontrolled.  BP goal = 130 mmHg. Patient reports adherence with medication. Control is suboptimal due to recent stress with moving to shelter. -Continue lisinopril/HCTZ 20/25 mg and amlodipine 10 mg  Patient reports recent chest pain within the last week. Referred patient to [facility] with Dr. Enid Derry.   Written patient instructions provided.  Total time in face to face counseling 45 minutes.   Follow up PCP Clinic Visit in 1-2 weeks.   Patient seen with Emeline General, PharmD Candidate, Janae Bridgeman, PharmD, PGY1 resident and Courtney Heys, PharmD,  PGY2 Pharmacy Resident.

## 2018-04-24 ENCOUNTER — Telehealth: Payer: Self-pay | Admitting: Licensed Clinical Social Worker

## 2018-04-24 NOTE — Assessment & Plan Note (Addendum)
No active chest pain here in office. Patient with hx of mild CAD seen on cath in 2017 and was started on Imdur and told to follow-up with cardiology which patient did not do. No sig fam hx. -continue aspirin, lipitor  -Will start on coreg 3.25 mg BID -sublingual nitro to use for prn chest pain -refer patient to cardiology given sx and history.  -Patient given strict ED return precautions.

## 2018-04-24 NOTE — Telephone Encounter (Signed)
  04/24/2018 Name: Heather [name] MRN: [id number] DOB: 1951/02/10  F/U call to Heather [name] to see if she was able to connect to community resources provided. Patient continues to look for housing utilizing resources received from [employer] and has pickup food from [facility] foodbox program.  Patient has not been able to connect with counseling resources for granddaughter due to "everything" they have going on.  Patient continues to work with the Department of Social Services to obtain custody of her grandchildren.  Plan: Patient will  1. Continue to look for housing 2. Pick up next food box on March 10th 3. Contact LCSW if additional resources are needed.  Casimer Lanius, LCSW [facility]   [phone] 1:53 PM

## 2018-04-27 ENCOUNTER — Other Ambulatory Visit: Payer: Self-pay | Admitting: Student in an Organized Health Care Education/Training Program

## 2018-04-27 NOTE — Progress Notes (Signed)
In review of the patient's documentation with pharmacology stating she has exertional chest pain, I am ordering a stress test and will get an ECG at our upcoming appointment for at least a baseline cardiac assessment.

## 2018-04-28 ENCOUNTER — Other Ambulatory Visit: Payer: Self-pay | Admitting: Student in an Organized Health Care Education/Training Program

## 2018-04-28 DIAGNOSIS — I10 Essential (primary) hypertension: Secondary | ICD-10-CM

## 2018-05-07 ENCOUNTER — Other Ambulatory Visit: Payer: Self-pay

## 2018-05-07 ENCOUNTER — Telehealth: Payer: Self-pay | Admitting: Pharmacist

## 2018-05-07 ENCOUNTER — Ambulatory Visit (INDEPENDENT_AMBULATORY_CARE_PROVIDER_SITE_OTHER): Payer: Medicare HMO | Admitting: Student in an Organized Health Care Education/Training Program

## 2018-05-07 VITALS — BP 130/82 | HR 83 | Temp 98.0°F | Ht 67.0 in | Wt 198.2 lb

## 2018-05-07 DIAGNOSIS — R1907 Generalized intra-abdominal and pelvic swelling, mass and lump: Secondary | ICD-10-CM | POA: Diagnosis not present

## 2018-05-07 DIAGNOSIS — K219 Gastro-esophageal reflux disease without esophagitis: Secondary | ICD-10-CM

## 2018-05-07 DIAGNOSIS — Z794 Long term (current) use of insulin: Secondary | ICD-10-CM | POA: Diagnosis not present

## 2018-05-07 DIAGNOSIS — E1165 Type 2 diabetes mellitus with hyperglycemia: Secondary | ICD-10-CM

## 2018-05-07 DIAGNOSIS — R0789 Other chest pain: Secondary | ICD-10-CM | POA: Diagnosis not present

## 2018-05-07 DIAGNOSIS — R935 Abnormal findings on diagnostic imaging of other abdominal regions, including retroperitoneum: Secondary | ICD-10-CM | POA: Diagnosis not present

## 2018-05-07 DIAGNOSIS — Z5941 Food insecurity: Secondary | ICD-10-CM

## 2018-05-07 DIAGNOSIS — E1140 Type 2 diabetes mellitus with diabetic neuropathy, unspecified: Secondary | ICD-10-CM | POA: Diagnosis not present

## 2018-05-07 DIAGNOSIS — IMO0002 Reserved for concepts with insufficient information to code with codable children: Secondary | ICD-10-CM

## 2018-05-07 DIAGNOSIS — Z594 Lack of adequate food and safe drinking water: Secondary | ICD-10-CM | POA: Diagnosis not present

## 2018-05-07 DIAGNOSIS — I10 Essential (primary) hypertension: Secondary | ICD-10-CM | POA: Diagnosis not present
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
## 2018-05-07 NOTE — Patient Instructions (Addendum)
It was a pleasure to see you today!  To summarize our discussion for this visit:  We are going to work to get your insulin covered so you don't go without it again. We will check your A1c in about 2 months to measure how well your sugars have been controlled overall.  Continue to take metformin 1000mg  in morning and at night  Increasing gabapentin for your nerve pain to 600mg  at bedtime  We are checking your urine today for proteins and helps [place] monitor your kidney function  For indigestion (gas), we are going to be conservative and try sitting upright for longer after meals. If that does not improve your symptoms, we can try a medication  For your chest pain- I'm glad you are not experiencing this any more. I would still like you to get a stress test at the cardiologist  I ordered an MRI for your abdomen to take a look at some findings 2 years ago.  Your blood pressure looks great today!  I have given you a list of ophthalmologists so you can get an eye exam  You are doing such a great job! I am very proud of you because we are handling so much and you have a lot on your plate.  Some additional health maintenance measures we should update are: Marland Kitchen Mammogram . DEXA scan  Please return to our clinic to see me in 1 month for follow up. Call the clinic at [phone] if your symptoms worsen or you have any concerns.  Thank you for allowing me to take part in your care,  Dr. Doristine Mango   Thanks for choosing [facility] for your primary care.

## 2018-05-07 NOTE — Telephone Encounter (Signed)
Notified by Dr. Ouida Sills that patient was unable to afford Lantus at this time.   Discussed finances with patient regarding eligibility for [place] patient assistance through [employer]. However, her income may qualify her for Medicare Extra Help. [employer] would likely require proof of denial of Medicare Extra Help before she would be approved for patient assistance.   Completed Medicare Extra Help application for the patient in office. Explained that the approval/denial notification would come in the mail in 3-6 weeks. If she is denied, we can then pursue [employer] patient assistance.   Provided with Lantus samples:   Medication Samples have been provided to the patient.  Drug name: Lantus (insulin glargine)     Strength: 100 units        Qty: 3 pens (9 mL)  LOT: [id number]  Exp.Date: 04/24/2020  The patient has been instructed regarding the correct time, dose, and frequency of taking this medication, including desired effects and most common side effects.   De Hollingshead 4:58 PM 05/07/2018

## 2018-05-08 DIAGNOSIS — R935 Abnormal findings on diagnostic imaging of other abdominal regions, including retroperitoneum: Secondary | ICD-10-CM | POA: Insufficient documentation

## 2018-05-08 DIAGNOSIS — K219 Gastro-esophageal reflux disease without esophagitis: Secondary | ICD-10-CM | POA: Insufficient documentation

## 2018-05-08 NOTE — Assessment & Plan Note (Signed)
Continues to have difficulty with food 2/2 finances and living situation She is following up with Heather [name], accessing the food box program, and seeking new shelter

## 2018-05-08 NOTE — Assessment & Plan Note (Addendum)
No chest pain since last visit Follow up with cardiology for a chest pain Continue ASA and carvedilol

## 2018-05-08 NOTE — Assessment & Plan Note (Signed)
Patient is feeling large medication burden and elected conservative methods first Discussed dietary changes, remaining upright for extended period of time after eating. Follow up in 1 month.

## 2018-05-08 NOTE — Assessment & Plan Note (Signed)
Well controlled on current therapy Orthostatic vital signs wnl No leg swelling Patient has appointment with cardiology 3/20

## 2018-05-08 NOTE — Assessment & Plan Note (Signed)
Pharmacist provided patient with 3 lantus pen samples and assisted with application to get further insulin coverage. Counseled patient on continuing use of insulin and will recheck Hgb A1c in about 2 months. We are not adding another diabetic agent at this time as patient is feeling a large medication burden. Continue metformin 1000mg  BID Increased gabapentin to 600mg  nightly Referred patient to ophthalmologist and provided with list of local providers Attempted to collect sample for urine microalbuminuria but patient unable to provide- will follow up again next week when she returns with children for their appointments. Discussed diet and exercise

## 2018-05-08 NOTE — Assessment & Plan Note (Signed)
Discussed with patient who did not have recollection of being informed of these findings.  Referred patient for abdominal MRI. CMA called [place] [facility] to schedule and they stated they would call the patient to schedule Informed the patient who is agreeable to the plan Follow up in 1 month or sooner if findings are significant

## 2018-05-09 DIAGNOSIS — R69 Illness, unspecified: Secondary | ICD-10-CM | POA: Diagnosis not present

## 2018-05-11 ENCOUNTER — Other Ambulatory Visit: Payer: Self-pay | Admitting: Student in an Organized Health Care Education/Training Program

## 2018-05-11 DIAGNOSIS — E11628 Type 2 diabetes mellitus with other skin complications: Secondary | ICD-10-CM

## 2018-05-14 ENCOUNTER — Telehealth: Payer: Self-pay | Admitting: Cardiology

## 2018-05-14 NOTE — Telephone Encounter (Addendum)
Patient called to reschedule office visit that was scheduled for 05/14/2017.  She was referred for evaluation of atypical chest pain.  According to the notes the patient does have cardiac risk factors including hypertension, uncontrolled diabetes and hyperlipidemia.  She has never smoked and has no family history of CAD.  Per last OV note from PCP she had not had any further CP.  I was unable to reach the patient but left a message to call back to get scheduled.

## 2018-05-14 NOTE — Telephone Encounter (Signed)
Patient is calling back and is available for a call.

## 2018-05-14 NOTE — Telephone Encounter (Signed)
I was able to contact patient after she called back.  The patient only had one episode of CP that was very mild and has not had any since February.  She has been feeling fine.  Please reschedule patient for 6-8 weeks.  She was told to call with any problems in the mean time.

## 2018-05-15 ENCOUNTER — Other Ambulatory Visit: Payer: Self-pay | Admitting: Student in an Organized Health Care Education/Training Program

## 2018-05-15 ENCOUNTER — Ambulatory Visit: Payer: Medicare HMO | Admitting: Cardiology

## 2018-05-15 DIAGNOSIS — G629 Polyneuropathy, unspecified: Secondary | ICD-10-CM

## 2018-05-15 DIAGNOSIS — I10 Essential (primary) hypertension: Secondary | ICD-10-CM

## 2018-06-02 ENCOUNTER — Other Ambulatory Visit: Payer: Medicare HMO

## 2018-06-02 ENCOUNTER — Other Ambulatory Visit: Payer: Self-pay

## 2018-06-02 DIAGNOSIS — E1165 Type 2 diabetes mellitus with hyperglycemia: Principal | ICD-10-CM

## 2018-06-02 DIAGNOSIS — E1140 Type 2 diabetes mellitus with diabetic neuropathy, unspecified: Secondary | ICD-10-CM

## 2018-06-02 DIAGNOSIS — IMO0002 Reserved for concepts with insufficient information to code with codable children: Secondary | ICD-10-CM

## 2018-06-02 DIAGNOSIS — Z794 Long term (current) use of insulin: Principal | ICD-10-CM

## 2018-06-02 NOTE — Telephone Encounter (Signed)
Patient reported running out of insulin today.  She reports taking 19 units (all she had left in her pen) today.     She has been taking Lantus 36 units daily with readings in the goal range including 90 to low 100s.     I asked her to come to the practice tomorrow AM and we would help with samples untl we can resolve long-term supply request from [employer].   She states she was denied by [employer] yesterday.   Patient is scheduled for visit with PCP Dr. Prince Rome on Thursday (2 days from now).  We discussed potential for that visit to be a visit without her coming to the building.   Dr. Ouida Sills to decide.

## 2018-06-02 NOTE — Telephone Encounter (Signed)
Pt LVM on nurse line stating her insurance is too expensive for her. Pt would like something else call in. Will route to PCP and Dr. Valentina Lucks. I can also supply her with a sample if we have any. Please advise.

## 2018-06-03 MED ORDER — INSULIN DEGLUDEC 200 UNIT/ML ~~LOC~~ SOPN: 32.0000 [IU] | PEN_INJECTOR | Freq: Every day | SUBCUTANEOUS | 0 refills | Status: DC

## 2018-06-03 NOTE — Telephone Encounter (Signed)
I think the telemedicine visit is very appropriate. Thank you for taking care of Heather [name].... Do you have any other suggestions for helping cover the insulin after MAP was denied? She is trying so hard to get her health back on track.

## 2018-06-03 NOTE — Telephone Encounter (Signed)
Noted and agree. 

## 2018-06-03 NOTE — Telephone Encounter (Addendum)
Patient came to the office as requested to pick up additional supply of long-acting insulin.    I am unsure why she does not qualify for support through MAP.  She continues to live at a "shelter" at this time.   She was provided Medication Samples (STOPPED LANTUS - no samples and STARTED Tresiba)  Drug name: Heather [name]       Strength: 200units/ml        Qty: 3  LOT: [id number]  Exp.Date: 10/26/2019   Dosing instructions: 32 units daily and increase back to 36 units based on reponse - goal fasting blood glucose remains 80-125.  The patient has been instructed regarding the correct time, dose, and frequency of taking this medication, including desired effects and most common side effects.   [name] 11:02 AM 06/03/2018   We discussed potential for E-visit tomorrow - TBD by PCP prior to PM visit.

## 2018-06-03 NOTE — Addendum Note (Signed)
Addended by: Leavy Cella on: 06/03/2018 11:05 AM   Modules accepted: Orders

## 2018-06-04 ENCOUNTER — Telehealth: Payer: Medicare HMO

## 2018-06-04 ENCOUNTER — Telehealth: Payer: Self-pay | Admitting: Student in an Organized Health Care Education/Training Program

## 2018-06-04 ENCOUNTER — Ambulatory Visit: Payer: Medicare HMO | Admitting: Student in an Organized Health Care Education/Training Program

## 2018-06-04 ENCOUNTER — Other Ambulatory Visit: Payer: Self-pay | Admitting: Student in an Organized Health Care Education/Training Program

## 2018-06-04 ENCOUNTER — Other Ambulatory Visit: Payer: Self-pay

## 2018-06-04 NOTE — Telephone Encounter (Signed)
Patient was on the schedule for telemedicine visit this afternoon.  I called her and she reported that she actually has no complaints.  She reports that her shoulder is going to be under quarantine for the next 14 days.  She reports that she was able to get all of her medications including her insulin to get her through the next 14 days.  There is nothing for [place] to talk about in this telephone visit, we we will go ahead and cancel the visit.  She was advised to call back if she has any questions or concerns.

## 2018-06-04 NOTE — Telephone Encounter (Addendum)
Contacted MAP - as patient has insurance, she is ineligible to receive help from [facility].   When I saw her in clinic on 05/07/2018, we applied for Medicare Extra Help together. This determination can take a few weeks to come in the mail, so I contacted her insurance company to see if they had anything on file. She was APPROVED for LIS level 1! For 2020, all brand medications are $8.95 and generics are $3.60 - this is for 30 OR 90 day supplies, so please prescribe 3 month supplies when appropriate for cost-effectiveness.   I confirmed with [facility] that a prescription they had on file for Basaglar ran through for $8.95. However, I reviewed her formulary, and Heather [name] is a preferred brand, so we can continue [place] therapy moving forward. Dr. Ouida Sills, could you send a prescription for Tresiba to [facility] so that she has something on file when she finishes the samples she was given yesterday?  Contacted patient to let her know. She expressed sincere appreciation for our assistance. She also noted that her shelter has a 14 day quarantine lock down starting at 4 pm today, so she is thankful for the supply of insulin provided yesterday.

## 2018-06-04 NOTE — Telephone Encounter (Signed)
It looks to me that Dr. Andria Frames filled this prescription yesterday already.  Thanks so much for all the work you put into this. She is a really special person. Great work, team!

## 2018-06-06 MED ORDER — INSULIN DEGLUDEC 200 UNIT/ML ~~LOC~~ SOPN: 32.0000 [IU] | PEN_INJECTOR | Freq: Every day | SUBCUTANEOUS | 2 refills | Status: DC

## 2018-06-06 NOTE — Addendum Note (Signed)
Addended by: Richarda Osmond on: 06/06/2018 04:01 PM   Modules accepted: Orders

## 2018-06-10 ENCOUNTER — Other Ambulatory Visit: Payer: Medicare HMO

## 2018-06-10 ENCOUNTER — Ambulatory Visit: Payer: Medicare HMO

## 2018-06-18 ENCOUNTER — Other Ambulatory Visit: Payer: Self-pay | Admitting: Student in an Organized Health Care Education/Training Program

## 2018-06-18 DIAGNOSIS — Z0000 Encounter for general adult medical examination without abnormal findings: Secondary | ICD-10-CM

## 2018-06-25 ENCOUNTER — Telehealth: Payer: Self-pay

## 2018-06-25 NOTE — Telephone Encounter (Signed)
Pt called nurse line stating she needs needles for [place] pen. Please advise.

## 2018-06-26 MED ORDER — NEEDLES & SYRINGES MISC: 1.0000 | Freq: Every day | 2 refills | Status: DC

## 2018-06-26 NOTE — Telephone Encounter (Signed)
I ordered new needles from pharmacy. Thank you for letting me know

## 2018-07-15 ENCOUNTER — Telehealth: Payer: Self-pay

## 2018-07-15 NOTE — Telephone Encounter (Signed)
F/U Message            Patient returning Danielle's call

## 2018-07-15 NOTE — Telephone Encounter (Signed)
Called pt to set up possible evisit. Left message asking pt to call the office.
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
## 2018-07-15 NOTE — Telephone Encounter (Signed)

## 2018-07-16 ENCOUNTER — Encounter: Payer: Self-pay | Admitting: Interventional Cardiology

## 2018-07-16 ENCOUNTER — Other Ambulatory Visit: Payer: Self-pay

## 2018-07-16 ENCOUNTER — Telehealth (INDEPENDENT_AMBULATORY_CARE_PROVIDER_SITE_OTHER): Payer: Medicare HMO | Admitting: Interventional Cardiology

## 2018-07-16 VITALS — BP 143/87 | HR 83 | Ht 67.0 in | Wt 198.0 lb

## 2018-07-16 DIAGNOSIS — E1159 Type 2 diabetes mellitus with other circulatory complications: Secondary | ICD-10-CM | POA: Diagnosis not present

## 2018-07-16 DIAGNOSIS — E782 Mixed hyperlipidemia: Secondary | ICD-10-CM

## 2018-07-16 DIAGNOSIS — I10 Essential (primary) hypertension: Secondary | ICD-10-CM | POA: Diagnosis not present

## 2018-07-16 DIAGNOSIS — I25118 Atherosclerotic heart disease of native coronary artery with other forms of angina pectoris: Secondary | ICD-10-CM | POA: Diagnosis not present

## 2018-07-16 MED ORDER — NITROGLYCERIN 0.4 MG SL SUBL: 0.4000 mg | SUBLINGUAL_TABLET | SUBLINGUAL | 3 refills | Status: DC | PRN

## 2018-07-16 NOTE — Patient Instructions (Signed)

## 2018-07-24 ENCOUNTER — Other Ambulatory Visit: Payer: Self-pay | Admitting: Family Medicine

## 2018-08-21 DIAGNOSIS — R69 Illness, unspecified: Secondary | ICD-10-CM | POA: Diagnosis not present

## 2018-09-15 DIAGNOSIS — J1289 Other viral pneumonia: Secondary | ICD-10-CM | POA: Diagnosis not present

## 2018-09-22 ENCOUNTER — Ambulatory Visit
Admission: RE | Admit: 2018-09-22 | Discharge: 2018-09-22 | Disposition: A | Discharge status: Home / Self Care | Payer: Medicare HMO | Source: Ambulatory Visit | Attending: Family Medicine | Admitting: Family Medicine

## 2018-09-22 ENCOUNTER — Other Ambulatory Visit: Payer: Self-pay

## 2018-09-22 DIAGNOSIS — Z780 Asymptomatic menopausal state: Secondary | ICD-10-CM | POA: Diagnosis not present

## 2018-09-22 DIAGNOSIS — Z1231 Encounter for screening mammogram for malignant neoplasm of breast: Secondary | ICD-10-CM | POA: Diagnosis not present

## 2018-09-22 DIAGNOSIS — Z13820 Encounter for screening for osteoporosis: Secondary | ICD-10-CM | POA: Diagnosis not present

## 2018-09-22 DIAGNOSIS — Z0000 Encounter for general adult medical examination without abnormal findings: Secondary | ICD-10-CM

## 2018-09-25 ENCOUNTER — Other Ambulatory Visit: Payer: Self-pay | Admitting: Family Medicine

## 2018-09-25 DIAGNOSIS — R928 Other abnormal and inconclusive findings on diagnostic imaging of breast: Secondary | ICD-10-CM

## 2018-09-30 ENCOUNTER — Other Ambulatory Visit: Payer: Self-pay | Admitting: Family Medicine

## 2018-09-30 ENCOUNTER — Ambulatory Visit
Admission: RE | Admit: 2018-09-30 | Discharge: 2018-09-30 | Disposition: A | Discharge status: Home / Self Care | Payer: Medicare HMO | Source: Ambulatory Visit | Attending: Family Medicine | Admitting: Family Medicine

## 2018-09-30 ENCOUNTER — Other Ambulatory Visit: Payer: Self-pay | Admitting: Student in an Organized Health Care Education/Training Program

## 2018-09-30 ENCOUNTER — Other Ambulatory Visit: Payer: Self-pay

## 2018-09-30 DIAGNOSIS — N6310 Unspecified lump in the right breast, unspecified quadrant: Secondary | ICD-10-CM

## 2018-09-30 DIAGNOSIS — R928 Other abnormal and inconclusive findings on diagnostic imaging of breast: Secondary | ICD-10-CM | POA: Diagnosis not present

## 2018-09-30 DIAGNOSIS — N62 Hypertrophy of breast: Secondary | ICD-10-CM | POA: Diagnosis not present

## 2018-09-30 DIAGNOSIS — N6489 Other specified disorders of breast: Secondary | ICD-10-CM | POA: Diagnosis not present

## 2018-09-30 DIAGNOSIS — N6311 Unspecified lump in the right breast, upper outer quadrant: Secondary | ICD-10-CM | POA: Diagnosis not present

## 2018-10-01 ENCOUNTER — Other Ambulatory Visit (HOSPITAL_COMMUNITY): Payer: Self-pay | Admitting: Diagnostic Radiology

## 2018-10-01 ENCOUNTER — Other Ambulatory Visit: Payer: Self-pay | Admitting: Family Medicine

## 2018-10-01 ENCOUNTER — Ambulatory Visit
Admission: RE | Admit: 2018-10-01 | Discharge: 2018-10-01 | Disposition: A | Discharge status: Home / Self Care | Payer: Medicare HMO | Source: Ambulatory Visit | Attending: Family Medicine | Admitting: Family Medicine

## 2018-10-01 DIAGNOSIS — N6310 Unspecified lump in the right breast, unspecified quadrant: Secondary | ICD-10-CM

## 2018-10-01 DIAGNOSIS — N6011 Diffuse cystic mastopathy of right breast: Secondary | ICD-10-CM | POA: Diagnosis not present

## 2018-10-01 DIAGNOSIS — N6311 Unspecified lump in the right breast, upper outer quadrant: Secondary | ICD-10-CM | POA: Diagnosis not present

## 2018-10-02 ENCOUNTER — Ambulatory Visit (INDEPENDENT_AMBULATORY_CARE_PROVIDER_SITE_OTHER): Payer: Medicare HMO | Admitting: Student in an Organized Health Care Education/Training Program

## 2018-10-02 ENCOUNTER — Encounter: Payer: Self-pay | Admitting: Student in an Organized Health Care Education/Training Program

## 2018-10-02 ENCOUNTER — Other Ambulatory Visit: Payer: Self-pay

## 2018-10-02 VITALS — BP 118/64 | HR 72 | Wt 206.2 lb

## 2018-10-02 DIAGNOSIS — E1165 Type 2 diabetes mellitus with hyperglycemia: Secondary | ICD-10-CM | POA: Diagnosis not present

## 2018-10-02 DIAGNOSIS — Z794 Long term (current) use of insulin: Secondary | ICD-10-CM | POA: Diagnosis not present

## 2018-10-02 DIAGNOSIS — I10 Essential (primary) hypertension: Secondary | ICD-10-CM

## 2018-10-02 DIAGNOSIS — IMO0002 Reserved for concepts with insufficient information to code with codable children: Secondary | ICD-10-CM

## 2018-10-02 DIAGNOSIS — E1140 Type 2 diabetes mellitus with diabetic neuropathy, unspecified: Secondary | ICD-10-CM

## 2018-10-02 LAB — POCT GLYCOSYLATED HEMOGLOBIN (HGB A1C): HbA1c, POC (controlled diabetic range): 7.6 % — AB (ref 0.0–7.0)

## 2018-10-02 LAB — POCT UA - MICROALBUMIN
Creatinine, POC: 200 mg/dL
Microalbumin Ur, POC: 150 mg/L

## 2018-10-02 MED ORDER — LIRAGLUTIDE 18 MG/3ML ~~LOC~~ SOPN: 0.6000 mg | PEN_INJECTOR | Freq: Every day | SUBCUTANEOUS | 3 refills | Status: DC

## 2018-10-02 MED ORDER — BYETTA 5 MCG PEN 5 MCG/0.02ML ~~LOC~~ SOPN: 5.0000 ug | PEN_INJECTOR | Freq: Two times a day (BID) | SUBCUTANEOUS | 1 refills | Status: DC

## 2018-10-02 MED ORDER — TRESIBA FLEXTOUCH 200 UNIT/ML ~~LOC~~ SOPN: 32.0000 [IU] | PEN_INJECTOR | Freq: Every day | SUBCUTANEOUS | 0 refills | Status: DC

## 2018-10-02 NOTE — Progress Notes (Signed)
Subjective:    Patient ID: Heather [name], female    DOB: 01/08/1951, 68 y.o.   MRN: [id number]   CC: diabetes check up  HPI:  Patient has been adherent with her insulin regiment and metformin.  Activity level has gone down since COVID pandemic.  She has not been eating as much as she used to when she was active.  Today will be our first time checking A1c since initiating insulin.  A1c came back today at 7.6 from previous 12.6.  Gradually the patient and she was extremely happy about this number.  Her goal is to eventually come off of insulin.  We talked about starting another agent today so that we can get closer to her goal and stop insulin in the future.  Patient is very unhappy about taking so many medications.  We will work on narrowing that number as we can.  She has been taking gabapentin 600 mg nightly.  She states that she still continues to have lower extremity numbness and tingling throughout the day.  We have discussed possibly taking that dose twice per day and I lifted up to her to determine if she wants to take half or full dose earlier in the day to help prevent sedation.  Also referring patient to physical therapy to help with the numbness and walking.  She would like a handicap placard so that she can park closer to the store and have more success in walking in the store.  Asked her to bring me back a form to fill out and to see physical therapy first.  Patient has not seen an eye exam yet.  Provided her with the list of ophthalmologist in the area again today.  Obtain urine sample for microalbumin.  Patient denies polyuria or polydipsia.  Patient is up about 5-10 pounds from previous baseline.  Discussed options for exercising and healthier diet.  She believes she has resistance bands in her storage and I encouraged her to get those out and use them.   Patient recently had mammograms with abnormal findings resulting in ultrasound and ultimately biopsy of the masses.  The pathology from  those masses came back yesterday and I informed the patient of the results that she had benign findings.  We celebrated this finding together.  She states that she still has the bandages on her breast from the biopsy.  She declined observing that today.  States that she has no issues with it.    Smoking status reviewed   ROS: pertinent noted in the HPI   Past medical history, surgical, family, and social history reviewed and updated in the EMR as appropriate.  Objective:  BP 118/64   Pulse 72   Wt 206 lb 3.2 oz (93.5 kg)   SpO2 98%   BMI 32.30 kg/m   Vitals and nursing note reviewed  General: NAD, pleasant, able to participate in exam Cardiac: RRR, S1 S2 present. normal heart sounds, no murmurs. Respiratory: CTAB, normal effort, No wheezes, rales or rhonchi Extremities: no edema or cyanosis. Skin: warm and dry, no rashes noted Neuro: alert, no obvious focal deficits Psych: Normal affect and mood  Assessment & Plan:    HTN (hypertension) Well-controlled 118/64 today  Uncontrolled diabetes mellitus with diabetic neuropathy, with long-term current use of insulin ([place]) Much improved from initial presentation.  A1c today is 7.6. Started patient on Victoza today.  And to recheck A1c in about 3 months. Ultimate goal would be to remove insulin from patient's medication  regiment and possibly switch her to a once weekly medication such as bydureon. Again provided patient with list of local eye doctors to screen for retinal disease   Doristine Mango, [place] PGY-2

## 2018-10-02 NOTE — Assessment & Plan Note (Signed)
Well-controlled 118/64 today

## 2018-10-02 NOTE — Patient Instructions (Signed)
It was a pleasure to see you today!  To summarize our discussion for this visit:  Your A1c is 7.6 today!!! [place] job!! Keep up the good work  We are starting another medication today- victoza. This is daily and can help reduce your A1c even more. Please check your blood sugars regularly to make sure you are not going too low.   I referred you for physical therapy for your feet- you can also try taking another dose of your gabapentin earlier in the day to see if that helps. Bring in a form for me to sign in order to apply for a handicap placccard.   Please complete an eye exam  I will let you know about the urine/kidney test when the results ar back  Some additional health maintenance measures we should update are: Health Maintenance Due  Topic Date Due  . OPHTHALMOLOGY EXAM  07/28/1960  . INFLUENZA VACCINE  09/26/2018  .    Please return to our clinic to see me in about 3 months for another A1c check.  Call the clinic at [phone] if your symptoms worsen or you have any concerns.   Thank you for allowing me to take part in your care,  Dr. Doristine Mango

## 2018-10-02 NOTE — Assessment & Plan Note (Addendum)
Much improved from initial presentation.  A1c today is 7.6. Started patient on Victoza today.  And to recheck A1c in about 3 months. Ultimate goal would be to remove insulin from patient's medication regiment and possibly switch her to a once weekly medication such as bydureon. Again provided patient with list of local eye doctors to screen for retinal disease

## 2018-10-12 ENCOUNTER — Other Ambulatory Visit: Payer: Self-pay

## 2018-10-12 ENCOUNTER — Encounter: Payer: Self-pay | Admitting: Physical Therapy

## 2018-10-12 ENCOUNTER — Ambulatory Visit: Payer: Medicare HMO | Attending: Family Medicine | Admitting: Physical Therapy

## 2018-10-12 DIAGNOSIS — M6281 Muscle weakness (generalized): Secondary | ICD-10-CM | POA: Diagnosis not present

## 2018-10-12 DIAGNOSIS — R262 Difficulty in walking, not elsewhere classified: Secondary | ICD-10-CM
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
## 2018-10-24 ENCOUNTER — Other Ambulatory Visit: Payer: Self-pay | Admitting: Student in an Organized Health Care Education/Training Program

## 2018-10-24 DIAGNOSIS — G629 Polyneuropathy, unspecified: Secondary | ICD-10-CM

## 2018-10-26 DIAGNOSIS — H35033 Hypertensive retinopathy, bilateral: Secondary | ICD-10-CM | POA: Diagnosis not present

## 2018-10-26 DIAGNOSIS — H35373 Puckering of macula, bilateral: Secondary | ICD-10-CM | POA: Diagnosis not present

## 2018-10-26 DIAGNOSIS — R69 Illness, unspecified: Secondary | ICD-10-CM | POA: Diagnosis not present

## 2018-10-26 DIAGNOSIS — E113313 Type 2 diabetes mellitus with moderate nonproliferative diabetic retinopathy with macular edema, bilateral: Secondary | ICD-10-CM | POA: Diagnosis not present

## 2018-10-26 DIAGNOSIS — H25813 Combined forms of age-related cataract, bilateral: Secondary | ICD-10-CM | POA: Diagnosis not present

## 2018-10-26 LAB — HM DIABETES EYE EXAM

## 2018-10-27 ENCOUNTER — Other Ambulatory Visit: Payer: Self-pay

## 2018-10-27 ENCOUNTER — Encounter: Payer: Self-pay | Admitting: Physical Therapy

## 2018-10-27 ENCOUNTER — Telehealth: Payer: Self-pay | Admitting: Student in an Organized Health Care Education/Training Program

## 2018-10-27 ENCOUNTER — Ambulatory Visit: Payer: Medicare HMO | Attending: Family Medicine | Admitting: Physical Therapy

## 2018-10-27 DIAGNOSIS — R262 Difficulty in walking, not elsewhere classified: Secondary | ICD-10-CM | POA: Diagnosis not present

## 2018-10-27 DIAGNOSIS — M6281 Muscle weakness (generalized): Secondary | ICD-10-CM | POA: Diagnosis not present
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
## 2018-10-27 NOTE — Telephone Encounter (Signed)
Placed in MDs box to be filled out. Jyquan Kenley, CMA  

## 2018-10-27 NOTE — Telephone Encounter (Signed)
[employer] form dropped off for placard at front desk for completion.  Verified that patient section of form has been completed.  Last DOS/WCC with PCP was 10/02/2018.  Placed form in team folder to be completed by clinical staff.  Heather [name]

## 2018-11-04 DIAGNOSIS — R69 Illness, unspecified: Secondary | ICD-10-CM | POA: Diagnosis not present

## 2018-11-04 DIAGNOSIS — H2513 Age-related nuclear cataract, bilateral: Secondary | ICD-10-CM | POA: Diagnosis not present

## 2018-11-04 DIAGNOSIS — E113313 Type 2 diabetes mellitus with moderate nonproliferative diabetic retinopathy with macular edema, bilateral: Secondary | ICD-10-CM | POA: Diagnosis not present

## 2018-11-04 DIAGNOSIS — H35371 Puckering of macula, right eye: Secondary | ICD-10-CM | POA: Diagnosis not present

## 2018-11-04 NOTE — Telephone Encounter (Signed)
LVM on patients phone informing her of form ready for pick up.

## 2018-11-09 ENCOUNTER — Encounter: Payer: Self-pay | Admitting: Physical Therapy

## 2018-11-09 ENCOUNTER — Other Ambulatory Visit: Payer: Self-pay

## 2018-11-09 ENCOUNTER — Ambulatory Visit: Payer: Medicare HMO | Admitting: Physical Therapy

## 2018-11-09 DIAGNOSIS — R262 Difficulty in walking, not elsewhere classified: Secondary | ICD-10-CM | POA: Diagnosis not present

## 2018-11-09 DIAGNOSIS — M6281 Muscle weakness (generalized): Secondary | ICD-10-CM
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
## 2018-11-22 DIAGNOSIS — R69 Illness, unspecified: Secondary | ICD-10-CM | POA: Diagnosis not present

## 2018-12-01 ENCOUNTER — Encounter: Payer: Self-pay | Admitting: Student in an Organized Health Care Education/Training Program

## 2018-12-09 DIAGNOSIS — E113313 Type 2 diabetes mellitus with moderate nonproliferative diabetic retinopathy with macular edema, bilateral: Secondary | ICD-10-CM | POA: Diagnosis not present

## 2018-12-20 DIAGNOSIS — R69 Illness, unspecified: Secondary | ICD-10-CM | POA: Diagnosis not present

## 2019-02-08 ENCOUNTER — Emergency Department (HOSPITAL_COMMUNITY)
Admission: EM | Admit: 2019-02-08 | Discharge: 2019-02-09 | Disposition: A | Discharge status: Home / Self Care | Payer: Medicare HMO | Source: Home / Self Care | Attending: Emergency Medicine | Admitting: Emergency Medicine

## 2019-02-08 ENCOUNTER — Other Ambulatory Visit: Payer: Self-pay

## 2019-02-08 ENCOUNTER — Encounter (HOSPITAL_COMMUNITY): Payer: Self-pay

## 2019-02-08 DIAGNOSIS — Z7982 Long term (current) use of aspirin: Secondary | ICD-10-CM | POA: Insufficient documentation

## 2019-02-08 DIAGNOSIS — J189 Pneumonia, unspecified organism: Secondary | ICD-10-CM | POA: Diagnosis not present

## 2019-02-08 DIAGNOSIS — Z79899 Other long term (current) drug therapy: Secondary | ICD-10-CM | POA: Insufficient documentation

## 2019-02-08 DIAGNOSIS — U071 COVID-19: Secondary | ICD-10-CM | POA: Insufficient documentation

## 2019-02-08 DIAGNOSIS — R0602 Shortness of breath: Secondary | ICD-10-CM | POA: Diagnosis not present

## 2019-02-08 DIAGNOSIS — J9601 Acute respiratory failure with hypoxia: Secondary | ICD-10-CM | POA: Diagnosis not present

## 2019-02-08 DIAGNOSIS — E119 Type 2 diabetes mellitus without complications: Secondary | ICD-10-CM | POA: Insufficient documentation

## 2019-02-08 DIAGNOSIS — R5381 Other malaise: Secondary | ICD-10-CM | POA: Diagnosis not present

## 2019-02-08 DIAGNOSIS — Z794 Long term (current) use of insulin: Secondary | ICD-10-CM | POA: Insufficient documentation

## 2019-02-08 DIAGNOSIS — J1289 Other viral pneumonia: Secondary | ICD-10-CM | POA: Insufficient documentation

## 2019-02-08 DIAGNOSIS — R05 Cough: Secondary | ICD-10-CM | POA: Diagnosis not present

## 2019-02-08 DIAGNOSIS — R0902 Hypoxemia: Secondary | ICD-10-CM | POA: Diagnosis not present

## 2019-02-08 DIAGNOSIS — I10 Essential (primary) hypertension: Secondary | ICD-10-CM | POA: Insufficient documentation

## 2019-02-08 MED ORDER — ACETAMINOPHEN 325 MG PO TABS
650.0000 mg | ORAL_TABLET | Freq: Once | ORAL | Status: AC | PRN
  Administered 2019-02-08: 650 mg via ORAL
  Filled 2019-02-08: qty 2

## 2019-02-08 NOTE — ED Triage Notes (Signed)
Patient arrived by PTAR from shelter with complaints of general weakness with body aches since Thursday. Fever of 102 pta. Alert and oriented.

## 2019-02-09 ENCOUNTER — Emergency Department (HOSPITAL_COMMUNITY): Payer: Medicare HMO

## 2019-02-09 DIAGNOSIS — R0602 Shortness of breath: Secondary | ICD-10-CM | POA: Diagnosis not present

## 2019-02-09 DIAGNOSIS — R05 Cough: Secondary | ICD-10-CM | POA: Diagnosis not present

## 2019-02-09 LAB — COMPREHENSIVE METABOLIC PANEL
ALT: 26 U/L (ref 0–44)
AST: 33 U/L (ref 15–41)
Albumin: 3.7 g/dL (ref 3.5–5.0)
Alkaline Phosphatase: 68 U/L (ref 38–126)
Anion gap: 17 — ABNORMAL HIGH (ref 5–15)
BUN: 32 mg/dL — ABNORMAL HIGH (ref 8–23)
CO2: 21 mmol/L — ABNORMAL LOW (ref 22–32)
Calcium: 8.8 mg/dL — ABNORMAL LOW (ref 8.9–10.3)
Chloride: 101 mmol/L (ref 98–111)
Creatinine, Ser: 2.25 mg/dL — ABNORMAL HIGH (ref 0.44–1.00)
GFR calc Af Amer: 25 mL/min — ABNORMAL LOW (ref >60)
GFR calc non Af Amer: 22 mL/min — ABNORMAL LOW (ref >60)
Glucose, Bld: 245 mg/dL — ABNORMAL HIGH (ref 70–99)
Potassium: 4.7 mmol/L (ref 3.5–5.1)
Sodium: 139 mmol/L (ref 135–145)
Total Bilirubin: 0.9 mg/dL (ref 0.3–1.2)
Total Protein: 8.4 g/dL — ABNORMAL HIGH (ref 6.5–8.1)

## 2019-02-09 LAB — LACTIC ACID, PLASMA: Lactic Acid, Venous: 1.7 mmol/L (ref 0.5–1.9)

## 2019-02-09 LAB — CBC WITH DIFFERENTIAL/PLATELET
Abs Immature Granulocytes: 0.02 10*3/uL (ref 0.00–0.07)
Basophils Absolute: 0 10*3/uL (ref 0.0–0.1)
Basophils Relative: 0 %
Eosinophils Absolute: 0 10*3/uL (ref 0.0–0.5)
Eosinophils Relative: 0 %
HCT: 44.5 % (ref 36.0–46.0)
Hemoglobin: 14.5 g/dL (ref 12.0–15.0)
Immature Granulocytes: 0 %
Lymphocytes Relative: 19 %
Lymphs Abs: 1.3 10*3/uL (ref 0.7–4.0)
MCH: 27.9 pg (ref 26.0–34.0)
MCHC: 32.6 g/dL (ref 30.0–36.0)
MCV: 85.7 fL (ref 80.0–100.0)
Monocytes Absolute: 0.5 10*3/uL (ref 0.1–1.0)
Monocytes Relative: 6 %
Neutro Abs: 5.2 10*3/uL (ref 1.7–7.7)
Neutrophils Relative %: 75 %
Platelets: 255 10*3/uL (ref 150–400)
RBC: 5.19 MIL/uL — ABNORMAL HIGH (ref 3.87–5.11)
RDW: 14.6 % (ref 11.5–15.5)
WBC: 7.1 10*3/uL (ref 4.0–10.5)
nRBC: 0 % (ref 0.0–0.2)

## 2019-02-09 LAB — CBG MONITORING, ED: Glucose-Capillary: 206 mg/dL — ABNORMAL HIGH (ref 70–99)

## 2019-02-09 MED ORDER — AZITHROMYCIN 250 MG PO TABS: ORAL_TABLET | ORAL | 0 refills | Status: DC

## 2019-02-09 MED ORDER — AZITHROMYCIN 250 MG PO TABS
500.0000 mg | ORAL_TABLET | Freq: Once | ORAL | Status: AC
  Administered 2019-02-09: 500 mg via ORAL
  Filled 2019-02-09: qty 2

## 2019-02-09 MED ORDER — SODIUM CHLORIDE 0.9% FLUSH: 3.0000 mL | Freq: Once | INTRAVENOUS | Status: DC

## 2019-02-09 NOTE — Discharge Instructions (Addendum)
X-ray shows a questionable small amount of pneumonia in your right lower lung.  Will start antibiotics for same.  Increase fluids.  Tylenol.  Eat when you can.  Recommend a repeat chest x-ray in 3 to 4 weeks.  This can be organized by your primary care doctor.  Socially distance.  Wash your hands.

## 2019-02-09 NOTE — ED Notes (Signed)
Pt discharge instructions and prescription reviewed with the patient. The patient verbalized understanding of both. Pt discharged. 
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
## 2019-02-10 ENCOUNTER — Telehealth (HOSPITAL_COMMUNITY): Payer: Self-pay

## 2019-02-10 LAB — NOVEL CORONAVIRUS, NAA (HOSP ORDER, SEND-OUT TO REF LAB; TAT 18-24 HRS): SARS-CoV-2, NAA: DETECTED — AB

## 2019-02-11 ENCOUNTER — Telehealth (HOSPITAL_COMMUNITY): Payer: Self-pay

## 2019-02-11 ENCOUNTER — Telehealth: Payer: Self-pay | Admitting: Unknown Physician Specialty

## 2019-02-11 NOTE — Telephone Encounter (Signed)
Called to discuss with patient about Covid symptoms and the use of bamlanivimab, a monoclonal antibody infusion for those with mild to moderate Covid symptoms and at a high risk of hospitalization.  Pt is qualified for this infusion at the [facility] infusion center due to Age > 31 and Diabetes   Message left to call back

## 2019-02-12 ENCOUNTER — Emergency Department (HOSPITAL_COMMUNITY): Payer: Medicare HMO

## 2019-02-12 ENCOUNTER — Inpatient Hospital Stay (HOSPITAL_COMMUNITY)
Admission: EM | Admit: 2019-02-12 | Discharge: 2019-02-23 | DRG: 177 | Disposition: A | Discharge status: Home / Self Care | Payer: Medicare HMO | Attending: Internal Medicine | Admitting: Internal Medicine

## 2019-02-12 ENCOUNTER — Encounter (HOSPITAL_COMMUNITY): Payer: Self-pay | Admitting: *Deleted

## 2019-02-12 DIAGNOSIS — Z79899 Other long term (current) drug therapy: Secondary | ICD-10-CM | POA: Diagnosis not present

## 2019-02-12 DIAGNOSIS — J069 Acute upper respiratory infection, unspecified: Secondary | ICD-10-CM | POA: Diagnosis not present

## 2019-02-12 DIAGNOSIS — Z9114 Patient's other noncompliance with medication regimen: Secondary | ICD-10-CM | POA: Diagnosis not present

## 2019-02-12 DIAGNOSIS — U071 COVID-19: Secondary | ICD-10-CM | POA: Diagnosis not present

## 2019-02-12 DIAGNOSIS — E1165 Type 2 diabetes mellitus with hyperglycemia: Secondary | ICD-10-CM | POA: Diagnosis not present

## 2019-02-12 DIAGNOSIS — Z6831 Body mass index (BMI) 31.0-31.9, adult: Secondary | ICD-10-CM

## 2019-02-12 DIAGNOSIS — T380X5A Adverse effect of glucocorticoids and synthetic analogues, initial encounter: Secondary | ICD-10-CM | POA: Diagnosis not present

## 2019-02-12 DIAGNOSIS — E785 Hyperlipidemia, unspecified: Secondary | ICD-10-CM | POA: Diagnosis present

## 2019-02-12 DIAGNOSIS — E1169 Type 2 diabetes mellitus with other specified complication: Secondary | ICD-10-CM | POA: Diagnosis present

## 2019-02-12 DIAGNOSIS — Z794 Long term (current) use of insulin: Secondary | ICD-10-CM

## 2019-02-12 DIAGNOSIS — E1140 Type 2 diabetes mellitus with diabetic neuropathy, unspecified: Secondary | ICD-10-CM | POA: Diagnosis not present

## 2019-02-12 DIAGNOSIS — E669 Obesity, unspecified: Secondary | ICD-10-CM | POA: Diagnosis present

## 2019-02-12 DIAGNOSIS — E782 Mixed hyperlipidemia: Secondary | ICD-10-CM | POA: Diagnosis not present

## 2019-02-12 DIAGNOSIS — R42 Dizziness and giddiness: Secondary | ICD-10-CM | POA: Diagnosis not present

## 2019-02-12 DIAGNOSIS — R5381 Other malaise: Secondary | ICD-10-CM | POA: Diagnosis not present

## 2019-02-12 DIAGNOSIS — I2510 Atherosclerotic heart disease of native coronary artery without angina pectoris: Secondary | ICD-10-CM | POA: Diagnosis not present

## 2019-02-12 DIAGNOSIS — IMO0002 Reserved for concepts with insufficient information to code with codable children: Secondary | ICD-10-CM

## 2019-02-12 DIAGNOSIS — Z7982 Long term (current) use of aspirin: Secondary | ICD-10-CM

## 2019-02-12 DIAGNOSIS — Z209 Contact with and (suspected) exposure to unspecified communicable disease: Secondary | ICD-10-CM | POA: Diagnosis not present

## 2019-02-12 DIAGNOSIS — I959 Hypotension, unspecified: Secondary | ICD-10-CM | POA: Diagnosis present

## 2019-02-12 DIAGNOSIS — R52 Pain, unspecified: Secondary | ICD-10-CM | POA: Diagnosis not present

## 2019-02-12 DIAGNOSIS — Z590 Homelessness: Secondary | ICD-10-CM

## 2019-02-12 DIAGNOSIS — R279 Unspecified lack of coordination: Secondary | ICD-10-CM | POA: Diagnosis not present

## 2019-02-12 DIAGNOSIS — K219 Gastro-esophageal reflux disease without esophagitis: Secondary | ICD-10-CM | POA: Diagnosis not present

## 2019-02-12 DIAGNOSIS — R000 Tachycardia, unspecified: Secondary | ICD-10-CM | POA: Diagnosis not present

## 2019-02-12 DIAGNOSIS — R0689 Other abnormalities of breathing: Secondary | ICD-10-CM | POA: Diagnosis not present

## 2019-02-12 DIAGNOSIS — G629 Polyneuropathy, unspecified: Secondary | ICD-10-CM | POA: Diagnosis not present

## 2019-02-12 DIAGNOSIS — Z9861 Coronary angioplasty status: Secondary | ICD-10-CM

## 2019-02-12 DIAGNOSIS — N179 Acute kidney failure, unspecified: Secondary | ICD-10-CM | POA: Diagnosis not present

## 2019-02-12 DIAGNOSIS — J1289 Other viral pneumonia: Secondary | ICD-10-CM | POA: Diagnosis not present

## 2019-02-12 DIAGNOSIS — Z833 Family history of diabetes mellitus: Secondary | ICD-10-CM

## 2019-02-12 DIAGNOSIS — E1139 Type 2 diabetes mellitus with other diabetic ophthalmic complication: Secondary | ICD-10-CM

## 2019-02-12 DIAGNOSIS — E118 Type 2 diabetes mellitus with unspecified complications: Secondary | ICD-10-CM

## 2019-02-12 DIAGNOSIS — J9601 Acute respiratory failure with hypoxia: Secondary | ICD-10-CM | POA: Diagnosis not present

## 2019-02-12 DIAGNOSIS — E1159 Type 2 diabetes mellitus with other circulatory complications: Secondary | ICD-10-CM | POA: Diagnosis present

## 2019-02-12 DIAGNOSIS — J1282 Pneumonia due to coronavirus disease 2019: Secondary | ICD-10-CM | POA: Diagnosis present

## 2019-02-12 DIAGNOSIS — R918 Other nonspecific abnormal finding of lung field: Secondary | ICD-10-CM | POA: Diagnosis not present

## 2019-02-12 DIAGNOSIS — R531 Weakness: Secondary | ICD-10-CM | POA: Diagnosis not present

## 2019-02-12 DIAGNOSIS — R0902 Hypoxemia: Secondary | ICD-10-CM | POA: Diagnosis not present

## 2019-02-12 DIAGNOSIS — I10 Essential (primary) hypertension: Secondary | ICD-10-CM | POA: Diagnosis present

## 2019-02-12 DIAGNOSIS — Z743 Need for continuous supervision: Secondary | ICD-10-CM | POA: Diagnosis not present

## 2019-02-12 LAB — CBC WITH DIFFERENTIAL/PLATELET
Abs Immature Granulocytes: 0.06 10*3/uL (ref 0.00–0.07)
Basophils Absolute: 0 10*3/uL (ref 0.0–0.1)
Basophils Relative: 0 %
Eosinophils Absolute: 0 10*3/uL (ref 0.0–0.5)
Eosinophils Relative: 0 %
HCT: 39.7 % (ref 36.0–46.0)
Hemoglobin: 12.6 g/dL (ref 12.0–15.0)
Immature Granulocytes: 1 %
Lymphocytes Relative: 8 %
Lymphs Abs: 0.8 10*3/uL (ref 0.7–4.0)
MCH: 27.2 pg (ref 26.0–34.0)
MCHC: 31.7 g/dL (ref 30.0–36.0)
MCV: 85.7 fL (ref 80.0–100.0)
Monocytes Absolute: 0.4 10*3/uL (ref 0.1–1.0)
Monocytes Relative: 4 %
Neutro Abs: 8.5 10*3/uL — ABNORMAL HIGH (ref 1.7–7.7)
Neutrophils Relative %: 87 %
Platelets: 279 10*3/uL (ref 150–400)
RBC: 4.63 MIL/uL (ref 3.87–5.11)
RDW: 14.4 % (ref 11.5–15.5)
WBC: 9.7 10*3/uL (ref 4.0–10.5)
nRBC: 0 % (ref 0.0–0.2)

## 2019-02-12 LAB — COMPREHENSIVE METABOLIC PANEL
ALT: 26 U/L (ref 0–44)
AST: 29 U/L (ref 15–41)
Albumin: 2.8 g/dL — ABNORMAL LOW (ref 3.5–5.0)
Alkaline Phosphatase: 60 U/L (ref 38–126)
Anion gap: 13 (ref 5–15)
BUN: 49 mg/dL — ABNORMAL HIGH (ref 8–23)
CO2: 24 mmol/L (ref 22–32)
Calcium: 8.1 mg/dL — ABNORMAL LOW (ref 8.9–10.3)
Chloride: 100 mmol/L (ref 98–111)
Creatinine, Ser: 1.8 mg/dL — ABNORMAL HIGH (ref 0.44–1.00)
GFR calc Af Amer: 33 mL/min — ABNORMAL LOW (ref >60)
GFR calc non Af Amer: 28 mL/min — ABNORMAL LOW (ref >60)
Glucose, Bld: 357 mg/dL — ABNORMAL HIGH (ref 70–99)
Potassium: 3.7 mmol/L (ref 3.5–5.1)
Sodium: 137 mmol/L (ref 135–145)
Total Bilirubin: 0.7 mg/dL (ref 0.3–1.2)
Total Protein: 7.6 g/dL (ref 6.5–8.1)

## 2019-02-12 LAB — HIV ANTIBODY (ROUTINE TESTING W REFLEX): HIV Screen 4th Generation wRfx: NONREACTIVE

## 2019-02-12 LAB — LACTIC ACID, PLASMA
Lactic Acid, Venous: 1.8 mmol/L (ref 0.5–1.9)
Lactic Acid, Venous: 2.6 mmol/L (ref 0.5–1.9)

## 2019-02-12 LAB — ABO/RH: ABO/RH(D): O POS

## 2019-02-12 LAB — HEMOGLOBIN A1C
Hgb A1c MFr Bld: 9.4 % — ABNORMAL HIGH (ref 4.8–5.6)
Mean Plasma Glucose: 223.08 mg/dL

## 2019-02-12 LAB — CBG MONITORING, ED: Glucose-Capillary: 328 mg/dL — ABNORMAL HIGH (ref 70–99)

## 2019-02-12 LAB — TROPONIN I (HIGH SENSITIVITY)
Troponin I (High Sensitivity): 7 ng/L (ref <18)
Troponin I (High Sensitivity): 8 ng/L (ref <18)

## 2019-02-12 LAB — TRIGLYCERIDES: Triglycerides: 140 mg/dL (ref <150)

## 2019-02-12 LAB — C-REACTIVE PROTEIN: CRP: 12.4 mg/dL — ABNORMAL HIGH (ref <1.0)

## 2019-02-12 LAB — D-DIMER, QUANTITATIVE: D-Dimer, Quant: 2.22 ug/mL-FEU — ABNORMAL HIGH (ref 0.00–0.50)

## 2019-02-12 LAB — FERRITIN: Ferritin: 1240 ng/mL — ABNORMAL HIGH (ref 11–307)

## 2019-02-12 LAB — LACTATE DEHYDROGENASE: LDH: 306 U/L — ABNORMAL HIGH (ref 98–192)

## 2019-02-12 LAB — FIBRINOGEN: Fibrinogen: 778 mg/dL — ABNORMAL HIGH (ref 210–475)

## 2019-02-12 LAB — PROCALCITONIN: Procalcitonin: 0.59 ng/mL

## 2019-02-12 MED ORDER — SODIUM CHLORIDE 0.9 % IV BOLUS
1000.0000 mL | Freq: Once | INTRAVENOUS | Status: AC
  Administered 2019-02-12: 1000 mL via INTRAVENOUS

## 2019-02-12 MED ORDER — ENOXAPARIN SODIUM 40 MG/0.4ML ~~LOC~~ SOLN
40.0000 mg | SUBCUTANEOUS | Status: DC
  Administered 2019-02-12 – 2019-02-22 (×11): 40 mg via SUBCUTANEOUS
  Filled 2019-02-12 (×11): qty 0.4

## 2019-02-12 MED ORDER — ONDANSETRON HCL 4 MG/2ML IJ SOLN
4.0000 mg | Freq: Once | INTRAMUSCULAR | Status: AC
  Administered 2019-02-12: 4 mg via INTRAVENOUS
  Filled 2019-02-12: qty 2

## 2019-02-12 MED ORDER — CARVEDILOL 3.125 MG PO TABS
3.1250 mg | ORAL_TABLET | Freq: Two times a day (BID) | ORAL | Status: DC
  Administered 2019-02-13 – 2019-02-23 (×22): 3.125 mg via ORAL
  Filled 2019-02-12 (×23): qty 1

## 2019-02-12 MED ORDER — INSULIN ASPART 100 UNIT/ML ~~LOC~~ SOLN
0.0000 [IU] | Freq: Every day | SUBCUTANEOUS | Status: DC
  Administered 2019-02-12: 4 [IU] via SUBCUTANEOUS
  Administered 2019-02-13: 5 [IU] via SUBCUTANEOUS
  Filled 2019-02-12: qty 0.05

## 2019-02-12 MED ORDER — SENNOSIDES-DOCUSATE SODIUM 8.6-50 MG PO TABS: 1.0000 | ORAL_TABLET | Freq: Every evening | ORAL | Status: DC | PRN

## 2019-02-12 MED ORDER — ATORVASTATIN CALCIUM 40 MG PO TABS
40.0000 mg | ORAL_TABLET | Freq: Every day | ORAL | Status: DC
  Administered 2019-02-12 – 2019-02-22 (×11): 40 mg via ORAL
  Filled 2019-02-12 (×11): qty 1

## 2019-02-12 MED ORDER — SODIUM CHLORIDE 0.9 % IV BOLUS
250.0000 mL | Freq: Once | INTRAVENOUS | Status: AC
  Administered 2019-02-12: 250 mL via INTRAVENOUS

## 2019-02-12 MED ORDER — ZINC SULFATE 220 (50 ZN) MG PO CAPS
220.0000 mg | ORAL_CAPSULE | Freq: Every day | ORAL | Status: DC
  Administered 2019-02-12 – 2019-02-23 (×12): 220 mg via ORAL
  Filled 2019-02-12 (×12): qty 1

## 2019-02-12 MED ORDER — DEXAMETHASONE SODIUM PHOSPHATE 10 MG/ML IJ SOLN
6.0000 mg | Freq: Once | INTRAMUSCULAR | Status: AC
  Administered 2019-02-12: 6 mg via INTRAVENOUS
  Filled 2019-02-12: qty 1

## 2019-02-12 MED ORDER — ACETAMINOPHEN 325 MG PO TABS: 650.0000 mg | ORAL_TABLET | Freq: Four times a day (QID) | ORAL | Status: DC | PRN

## 2019-02-12 MED ORDER — INSULIN GLARGINE 100 UNIT/ML ~~LOC~~ SOLN: 10.0000 [IU] | Freq: Every day | SUBCUTANEOUS | Status: DC

## 2019-02-12 MED ORDER — NITROGLYCERIN 0.4 MG SL SUBL: 0.4000 mg | SUBLINGUAL_TABLET | SUBLINGUAL | Status: DC | PRN

## 2019-02-12 MED ORDER — BENZONATATE 100 MG PO CAPS
200.0000 mg | ORAL_CAPSULE | Freq: Three times a day (TID) | ORAL | Status: DC
  Administered 2019-02-12 – 2019-02-14 (×5): 200 mg via ORAL
  Filled 2019-02-12 (×5): qty 2

## 2019-02-12 MED ORDER — SODIUM CHLORIDE 0.9 % IV SOLN
100.0000 mg | Freq: Every day | INTRAVENOUS | Status: AC
  Administered 2019-02-13 – 2019-02-16 (×4): 100 mg via INTRAVENOUS
  Filled 2019-02-12 (×4): qty 20

## 2019-02-12 MED ORDER — SODIUM CHLORIDE 0.9 % IV SOLN
500.0000 mg | Freq: Once | INTRAVENOUS | Status: AC
  Administered 2019-02-12: 500 mg via INTRAVENOUS
  Filled 2019-02-12: qty 500

## 2019-02-12 MED ORDER — ZOLPIDEM TARTRATE 5 MG PO TABS
5.0000 mg | ORAL_TABLET | Freq: Every evening | ORAL | Status: DC | PRN
  Administered 2019-02-13 – 2019-02-14 (×2): 5 mg via ORAL
  Filled 2019-02-12 (×2): qty 1

## 2019-02-12 MED ORDER — GUAIFENESIN ER 600 MG PO TB12
600.0000 mg | ORAL_TABLET | Freq: Two times a day (BID) | ORAL | Status: DC
  Administered 2019-02-12 – 2019-02-14 (×4): 600 mg via ORAL
  Filled 2019-02-12 (×4): qty 1

## 2019-02-12 MED ORDER — ASCORBIC ACID 500 MG PO TABS
500.0000 mg | ORAL_TABLET | Freq: Every day | ORAL | Status: DC
  Administered 2019-02-12 – 2019-02-23 (×12): 500 mg via ORAL
  Filled 2019-02-12 (×12): qty 1

## 2019-02-12 MED ORDER — INSULIN GLARGINE 100 UNIT/ML ~~LOC~~ SOLN
20.0000 [IU] | Freq: Every day | SUBCUTANEOUS | Status: DC
  Administered 2019-02-13 – 2019-02-15 (×3): 20 [IU] via SUBCUTANEOUS
  Filled 2019-02-12 (×3): qty 0.2

## 2019-02-12 MED ORDER — GABAPENTIN 300 MG PO CAPS
600.0000 mg | ORAL_CAPSULE | Freq: Every day | ORAL | Status: DC
  Administered 2019-02-12 – 2019-02-22 (×11): 600 mg via ORAL
  Filled 2019-02-12 (×7): qty 2
  Filled 2019-02-12: qty 6
  Filled 2019-02-12 (×3): qty 2

## 2019-02-12 MED ORDER — SODIUM CHLORIDE 0.9 % IV SOLN
200.0000 mg | Freq: Once | INTRAVENOUS | Status: AC
  Administered 2019-02-12: 200 mg via INTRAVENOUS
  Filled 2019-02-12: qty 200

## 2019-02-12 MED ORDER — ASPIRIN 81 MG PO CHEW
81.0000 mg | CHEWABLE_TABLET | Freq: Every day | ORAL | Status: DC
  Administered 2019-02-12 – 2019-02-23 (×12): 81 mg via ORAL
  Filled 2019-02-12 (×12): qty 1

## 2019-02-12 MED ORDER — DEXAMETHASONE SODIUM PHOSPHATE 10 MG/ML IJ SOLN
6.0000 mg | INTRAMUSCULAR | Status: DC
  Administered 2019-02-13 – 2019-02-17 (×5): 6 mg via INTRAVENOUS
  Filled 2019-02-12 (×5): qty 1

## 2019-02-12 MED ORDER — SODIUM CHLORIDE 0.9 % IV SOLN
1.0000 g | Freq: Once | INTRAVENOUS | Status: AC
  Administered 2019-02-12: 1 g via INTRAVENOUS
  Filled 2019-02-12: qty 10

## 2019-02-12 MED ORDER — BISACODYL 5 MG PO TBEC: 5.0000 mg | DELAYED_RELEASE_TABLET | Freq: Every day | ORAL | Status: DC | PRN

## 2019-02-12 MED ORDER — TOCILIZUMAB 400 MG/20ML IV SOLN
700.0000 mg | Freq: Once | INTRAVENOUS | Status: AC
  Administered 2019-02-12: 700 mg via INTRAVENOUS
  Filled 2019-02-12: qty 35

## 2019-02-12 MED ORDER — AMLODIPINE BESYLATE 10 MG PO TABS
10.0000 mg | ORAL_TABLET | Freq: Every day | ORAL | Status: DC
  Administered 2019-02-12 – 2019-02-23 (×12): 10 mg via ORAL
  Filled 2019-02-12 (×4): qty 1
  Filled 2019-02-12: qty 2
  Filled 2019-02-12 (×7): qty 1

## 2019-02-12 MED ORDER — INSULIN ASPART 100 UNIT/ML ~~LOC~~ SOLN
0.0000 [IU] | Freq: Three times a day (TID) | SUBCUTANEOUS | Status: DC
  Administered 2019-02-13: 7 [IU] via SUBCUTANEOUS
  Administered 2019-02-13: 5 [IU] via SUBCUTANEOUS
  Administered 2019-02-13 – 2019-02-14 (×2): 7 [IU] via SUBCUTANEOUS
  Administered 2019-02-14: 3 [IU] via SUBCUTANEOUS
  Filled 2019-02-12: qty 0.09

## 2019-02-12 MED ORDER — ISOSORBIDE MONONITRATE ER 30 MG PO TB24
30.0000 mg | ORAL_TABLET | Freq: Every day | ORAL | Status: DC
  Administered 2019-02-12 – 2019-02-23 (×12): 30 mg via ORAL
  Filled 2019-02-12 (×13): qty 1

## 2019-02-12 NOTE — ED Notes (Signed)
[facility] called for [employer]
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
## 2019-02-12 NOTE — ED Triage Notes (Addendum)
Pt BIB EMS and coming from transitional residence ([address] [place]).  Weakness x 2 weeks. D/c from hosptial 3 days ago.  Pt has a tremors and has been tested for COVID but results have not returned. EMS gave pt 566ml bolus. Pt feels warm to the touch.  Pt was put on a non rebeather on 8L for low O2 sats but pt was also not taking deep inhales. Hx of DM but is non compliant  EMS VS 130/85 CBG: 423 100% 8L non breather

## 2019-02-12 NOTE — ED Notes (Signed)
Pt assisted to ambulating in the room and using bedside commode. Pt O2 saturation dropped to 85% on 3L [place]. While going from [facility] to bed, pt O2 saturation dropped to 83%. Rancour made aware, pt O2 was increased to 5L [place] per Dr. Wyvonnia Dusky order at bedside.

## 2019-02-12 NOTE — ED Notes (Signed)
Date and time results received: 02/12/19 1947 (use smartphrase ".now" to insert current time)  Test: Lactic Acid Critical Value: 2.6  Name of Provider Notified: Caroline More Paged  Orders Received? Or Actions Taken?: Orders Received - See Orders for details

## 2019-02-13 ENCOUNTER — Other Ambulatory Visit: Payer: Self-pay

## 2019-02-13 ENCOUNTER — Other Ambulatory Visit: Payer: Self-pay | Admitting: Student in an Organized Health Care Education/Training Program

## 2019-02-13 DIAGNOSIS — E1140 Type 2 diabetes mellitus with diabetic neuropathy, unspecified: Secondary | ICD-10-CM | POA: Diagnosis not present

## 2019-02-13 DIAGNOSIS — K219 Gastro-esophageal reflux disease without esophagitis: Secondary | ICD-10-CM

## 2019-02-13 DIAGNOSIS — J069 Acute upper respiratory infection, unspecified: Secondary | ICD-10-CM

## 2019-02-13 DIAGNOSIS — J1289 Other viral pneumonia: Secondary | ICD-10-CM | POA: Diagnosis not present

## 2019-02-13 DIAGNOSIS — E11628 Type 2 diabetes mellitus with other skin complications: Secondary | ICD-10-CM

## 2019-02-13 DIAGNOSIS — I2510 Atherosclerotic heart disease of native coronary artery without angina pectoris: Secondary | ICD-10-CM | POA: Diagnosis not present

## 2019-02-13 DIAGNOSIS — N179 Acute kidney failure, unspecified: Secondary | ICD-10-CM | POA: Diagnosis not present

## 2019-02-13 DIAGNOSIS — U071 COVID-19: Secondary | ICD-10-CM | POA: Diagnosis not present

## 2019-02-13 DIAGNOSIS — I10 Essential (primary) hypertension: Secondary | ICD-10-CM

## 2019-02-13 DIAGNOSIS — G629 Polyneuropathy, unspecified: Secondary | ICD-10-CM

## 2019-02-13 DIAGNOSIS — E669 Obesity, unspecified: Secondary | ICD-10-CM | POA: Diagnosis not present

## 2019-02-13 DIAGNOSIS — E785 Hyperlipidemia, unspecified: Secondary | ICD-10-CM | POA: Diagnosis not present

## 2019-02-13 DIAGNOSIS — J9601 Acute respiratory failure with hypoxia: Secondary | ICD-10-CM | POA: Diagnosis not present

## 2019-02-13 LAB — CBC WITH DIFFERENTIAL/PLATELET
Abs Immature Granulocytes: 0.14 10*3/uL — ABNORMAL HIGH (ref 0.00–0.07)
Basophils Absolute: 0 10*3/uL (ref 0.0–0.1)
Basophils Relative: 0 %
Eosinophils Absolute: 0 10*3/uL (ref 0.0–0.5)
Eosinophils Relative: 0 %
HCT: 36.5 % (ref 36.0–46.0)
Hemoglobin: 11.6 g/dL — ABNORMAL LOW (ref 12.0–15.0)
Immature Granulocytes: 2 %
Lymphocytes Relative: 9 %
Lymphs Abs: 0.7 10*3/uL (ref 0.7–4.0)
MCH: 27.6 pg (ref 26.0–34.0)
MCHC: 31.8 g/dL (ref 30.0–36.0)
MCV: 86.9 fL (ref 80.0–100.0)
Monocytes Absolute: 0.3 10*3/uL (ref 0.1–1.0)
Monocytes Relative: 4 %
Neutro Abs: 6.5 10*3/uL (ref 1.7–7.7)
Neutrophils Relative %: 85 %
Platelets: 269 10*3/uL (ref 150–400)
RBC: 4.2 MIL/uL (ref 3.87–5.11)
RDW: 14.6 % (ref 11.5–15.5)
WBC: 7.6 10*3/uL (ref 4.0–10.5)
nRBC: 0 % (ref 0.0–0.2)

## 2019-02-13 LAB — GLUCOSE, CAPILLARY
Glucose-Capillary: 291 mg/dL — ABNORMAL HIGH (ref 70–99)
Glucose-Capillary: 311 mg/dL — ABNORMAL HIGH (ref 70–99)
Glucose-Capillary: 372 mg/dL — ABNORMAL HIGH (ref 70–99)

## 2019-02-13 LAB — COMPREHENSIVE METABOLIC PANEL
ALT: 24 U/L (ref 0–44)
AST: 25 U/L (ref 15–41)
Albumin: 2.5 g/dL — ABNORMAL LOW (ref 3.5–5.0)
Alkaline Phosphatase: 57 U/L (ref 38–126)
Anion gap: 11 (ref 5–15)
BUN: 45 mg/dL — ABNORMAL HIGH (ref 8–23)
CO2: 24 mmol/L (ref 22–32)
Calcium: 7.9 mg/dL — ABNORMAL LOW (ref 8.9–10.3)
Chloride: 105 mmol/L (ref 98–111)
Creatinine, Ser: 1.6 mg/dL — ABNORMAL HIGH (ref 0.44–1.00)
GFR calc Af Amer: 38 mL/min — ABNORMAL LOW (ref >60)
GFR calc non Af Amer: 33 mL/min — ABNORMAL LOW (ref >60)
Glucose, Bld: 349 mg/dL — ABNORMAL HIGH (ref 70–99)
Potassium: 4.4 mmol/L (ref 3.5–5.1)
Sodium: 140 mmol/L (ref 135–145)
Total Bilirubin: 0.4 mg/dL (ref 0.3–1.2)
Total Protein: 7 g/dL (ref 6.5–8.1)

## 2019-02-13 LAB — FERRITIN: Ferritin: 1225 ng/mL — ABNORMAL HIGH (ref 11–307)

## 2019-02-13 LAB — SAMPLE TO BLOOD BANK

## 2019-02-13 LAB — LACTIC ACID, PLASMA: Lactic Acid, Venous: 1.3 mmol/L (ref 0.5–1.9)

## 2019-02-13 LAB — MAGNESIUM: Magnesium: 2.1 mg/dL (ref 1.7–2.4)

## 2019-02-13 LAB — D-DIMER, QUANTITATIVE: D-Dimer, Quant: 2.43 ug/mL-FEU — ABNORMAL HIGH (ref 0.00–0.50)

## 2019-02-13 LAB — PHOSPHORUS: Phosphorus: 2.4 mg/dL — ABNORMAL LOW (ref 2.5–4.6)

## 2019-02-13 LAB — C-REACTIVE PROTEIN: CRP: 12.8 mg/dL — ABNORMAL HIGH (ref <1.0)

## 2019-02-13 LAB — MRSA PCR SCREENING: MRSA by PCR: NEGATIVE

## 2019-02-13 MED ORDER — ENSURE ENLIVE PO LIQD
237.0000 mL | Freq: Two times a day (BID) | ORAL | Status: DC
  Administered 2019-02-13: 237 mL via ORAL

## 2019-02-13 NOTE — Plan of Care (Signed)
Nerw admit

## 2019-02-13 NOTE — ED Notes (Signed)
ED TO INPATIENT HANDOFF REPORT  Name/Age/Gender Heather [name] 68 y.o. female  Code Status    Code Status Orders  (From admission, onward)         Start     Ordered   02/12/19 1739  Full code  Continuous     02/12/19 1742        Code Status History    Date Active Date Inactive Code Status Order ID Comments User Context   05/25/2016 1914 05/28/2016 1546 Full Code [id number]  Lily Kocher, MD ED   01/01/2016 2210 01/04/2016 2021 Full Code [id number]  Lily Kocher, MD ED   02/22/2015 1258 03/01/2015 2236 Full Code [id number]  Leandrew Koyanagi, MD Inpatient   02/16/2015 0641 02/22/2015 1258 Full Code [id number]  Melina Schools, MD Inpatient   Advance Care Planning Activity      Home/SNF/Other Home  Chief Complaint Pneumonia due to COVID-19 virus [U07.1, J12.89]  Level of Care/Admitting Diagnosis ED Disposition    ED Disposition Condition [place]: [place] [[id number]]  Level of Care: Progressive [102]  Covid Evaluation: Confirmed COVID Positive  Diagnosis: Pneumonia due to COVID-19 virus [id number]  Admitting Physician: Terrilee Croak [id number]  Attending Physician: Terrilee Croak [id number]  Estimated length of stay: past midnight tomorrow  Certification:: I certify this patient will need inpatient services for at least 2 midnights       Medical History Past Medical History:  Diagnosis Date  . Abscess, gluteal, right 05/25/2016  . Diabetes mellitus   . Encounter for hepatitis C screening test for low risk patient 04/13/2018   Screening 04/13/2018  . Food insecurity 04/13/2018  . Hyperlipidemia   . Hypertension   . Medicare annual wellness visit, initial 04/13/2018   Patient is able to have AWV initial  . Neuropathy   . Other chest pain 01/01/2016  . Screening for breast cancer 04/13/2018  . Sepsis ([place]) 05/25/2016  . Tibial plateau fracture, left 02/16/2015  . Uncontrolled diabetes mellitus with diabetic neuropathy, with long-term  current use of insulin ([place]) 01/01/2016    Allergies No Known Allergies  IV Location/Drains/Wounds Patient Lines/Drains/Airways Status   Active Line/Drains/Airways    Name:   Placement date:   Placement time:   Site:   Days:   Peripheral IV 02/12/19 Left Wrist   02/12/19    1425    Wrist   1   Airway   05/26/16    1046     993   Incision (Closed) 05/26/16 Buttocks Other (Comment)   05/26/16    1043     993   Wound / Incision (Open or Dehisced) 05/25/16 Incision - Open Buttocks Left;Medial Approximately 1.25 cm open incision area, which was previously debrided.   05/25/16    2045    Buttocks   994   Wound / Incision (Open or Dehisced) 05/25/16 Other (Comment) Buttocks Medial;Upper small round open area, no drainage   05/25/16    2030    Buttocks   994          Labs/Imaging Results for orders placed or performed during the hospital encounter of 02/12/19 (from the past 48 hour(s))  HIV Antibody (routine testing w rflx)     Status: None   Collection Time: 02/12/19  3:36 PM  Result Value Ref Range   HIV Screen 4th Generation wRfx NON REACTIVE NON REACTIVE    Comment: Performed at [facility], [place] [address], [place], [place]  [id number]  Lactic acid, plasma     Status: None   Collection Time: 02/12/19  3:38 PM  Result Value Ref Range   Lactic Acid, Venous 1.8 0.5 - 1.9 mmol/L    Comment: Performed at [facility], [place] [address] [place], [place] [address]  CBC WITH DIFFERENTIAL     Status: Abnormal   Collection Time: 02/12/19  3:38 PM  Result Value Ref Range   WBC 9.7 4.0 - 10.5 K/uL   RBC 4.63 3.87 - 5.11 MIL/uL   Hemoglobin 12.6 12.0 - 15.0 g/dL   HCT 39.7 36.0 - 46.0 %   MCV 85.7 80.0 - 100.0 fL   MCH 27.2 26.0 - 34.0 pg   MCHC 31.7 30.0 - 36.0 g/dL   RDW 14.4 11.5 - 15.5 %   Platelets 279 150 - 400 K/uL   nRBC 0.0 0.0 - 0.2 %   Neutrophils Relative % 87 %   Neutro Abs 8.5 (H) 1.7 - 7.7 K/uL   Lymphocytes Relative 8 %   Lymphs Abs 0.8 0.7 - 4.0  K/uL   Monocytes Relative 4 %   Monocytes Absolute 0.4 0.1 - 1.0 K/uL   Eosinophils Relative 0 %   Eosinophils Absolute 0.0 0.0 - 0.5 K/uL   Basophils Relative 0 %   Basophils Absolute 0.0 0.0 - 0.1 K/uL   Immature Granulocytes 1 %   Abs Immature Granulocytes 0.06 0.00 - 0.07 K/uL    Comment: Performed at [facility], [place] [address] [place], [place] [address]  Comprehensive metabolic panel     Status: Abnormal   Collection Time: 02/12/19  3:38 PM  Result Value Ref Range   Sodium 137 135 - 145 mmol/L   Potassium 3.7 3.5 - 5.1 mmol/L   Chloride 100 98 - 111 mmol/L   CO2 24 22 - 32 mmol/L   Glucose, Bld 357 (H) 70 - 99 mg/dL   BUN 49 (H) 8 - 23 mg/dL   Creatinine, Ser 1.80 (H) 0.44 - 1.00 mg/dL   Calcium 8.1 (L) 8.9 - 10.3 mg/dL   Total Protein 7.6 6.5 - 8.1 g/dL   Albumin 2.8 (L) 3.5 - 5.0 g/dL   AST 29 15 - 41 U/L   ALT 26 0 - 44 U/L   Alkaline Phosphatase 60 38 - 126 U/L   Total Bilirubin 0.7 0.3 - 1.2 mg/dL   GFR calc non Af Amer 28 (L) >60 mL/min   GFR calc Af Amer 33 (L) >60 mL/min   Anion gap 13 5 - 15    Comment: Performed at [facility], [place] [address], [place], [place] [address]  D-dimer, quantitative     Status: Abnormal   Collection Time: 02/12/19  3:38 PM  Result Value Ref Range   D-Dimer, Quant 2.22 (H) 0.00 - 0.50 ug/mL-FEU    Comment: (NOTE) At the manufacturer cut-off of 0.50 ug/mL FEU, this assay has been documented to exclude PE with a sensitivity and negative predictive value of 97 to 99%.  At this time, this assay has not been approved by the FDA to exclude DVT/VTE. Results should be correlated with clinical presentation. Performed at [facility], [place] [address], [place], [place] [address]   Procalcitonin     Status: None   Collection Time: 02/12/19  3:38 PM  Result Value Ref Range   Procalcitonin 0.59 ng/mL    Comment:        Interpretation: PCT > 0.5 ng/mL and <=  2 ng/mL: Systemic  infection (sepsis) is possible, but other conditions are known to elevate PCT as well. (NOTE)       Sepsis PCT Algorithm           Lower Respiratory Tract                                      Infection PCT Algorithm    ----------------------------     ----------------------------         PCT < 0.25 ng/mL                PCT < 0.10 ng/mL         Strongly encourage             Strongly discourage   discontinuation of antibiotics    initiation of antibiotics    ----------------------------     -----------------------------       PCT 0.25 - 0.50 ng/mL            PCT 0.10 - 0.25 ng/mL               OR       >80% decrease in PCT            Discourage initiation of                                            antibiotics      Encourage discontinuation           of antibiotics    ----------------------------     -----------------------------         PCT >= 0.50 ng/mL              PCT 0.26 - 0.50 ng/mL                AND       <80% decrease in PCT             Encourage initiation of                                             antibiotics       Encourage continuation           of antibiotics    ----------------------------     -----------------------------        PCT >= 0.50 ng/mL                  PCT > 0.50 ng/mL               AND         increase in PCT                  Strongly encourage                                      initiation of antibiotics    Strongly encourage escalation           of antibiotics                                     -----------------------------  PCT <= 0.25 ng/mL                                                 OR                                        > 80% decrease in PCT                                     Discontinue / Do not initiate                                             antibiotics Performed at [place] [address], [place], [place] [address]   Lactate dehydrogenase     Status:  Abnormal   Collection Time: 02/12/19  3:38 PM  Result Value Ref Range   LDH 306 (H) 98 - 192 U/L    Comment: Performed at [facility], [place] [address] [place], [place] [address]  Ferritin     Status: Abnormal   Collection Time: 02/12/19  3:38 PM  Result Value Ref Range   Ferritin 1,240 (H) 11 - 307 ng/mL    Comment: Performed at [facility], [place] [address], [place], [place] [address]  Triglycerides     Status: None   Collection Time: 02/12/19  3:38 PM  Result Value Ref Range   Triglycerides 140 <150 mg/dL    Comment: Performed at [facility], [place] [address] [place], [place] [address]  Fibrinogen     Status: Abnormal   Collection Time: 02/12/19  3:38 PM  Result Value Ref Range   Fibrinogen 778 (H) 210 - 475 mg/dL    Comment: Performed at [facility], [place] [address] [place], [place] [address]  C-reactive protein     Status: Abnormal   Collection Time: 02/12/19  3:38 PM  Result Value Ref Range   CRP 12.4 (H) <1.0 mg/dL    Comment: Performed at [facility], [place] [address], [place], [place] [address]  Troponin I (High Sensitivity)     Status: None   Collection Time: 02/12/19  3:38 PM  Result Value Ref Range   Troponin I (High Sensitivity) 7 <18 ng/L    Comment: (NOTE) Elevated high sensitivity troponin I (hsTnI) values and significant  changes across serial measurements may suggest ACS but many other  chronic and acute conditions are known to elevate hsTnI results.  Refer to the Links section for chest pain algorithms and additional  guidance. Performed at [facility], [place] [address], [place], [place] [address]   Hemoglobin A1c     Status: Abnormal   Collection Time: 02/12/19  3:38 PM  Result Value Ref Range   Hgb A1c MFr Bld 9.4 (H) 4.8 - 5.6 %    Comment: (NOTE) Pre diabetes:          5.7%-6.4% Diabetes:              >6.4% Glycemic control for    <7.0%  adults with diabetes    Mean Plasma Glucose 223.08 mg/dL    Comment: Performed at [facility], [place] [address], [place], [place] [address]  Blood Culture (routine x 2)     Status: None (Preliminary result)   Collection Time: 02/12/19  3:48 PM   Specimen: BLOOD LEFT FOREARM  Result Value Ref Range   Specimen Description      BLOOD LEFT FOREARM Performed at [place] [address] [place], [place] [address]    Special Requests      BOTTLES DRAWN AEROBIC AND ANAEROBIC Blood Culture results may not be optimal due to an inadequate volume of blood received in culture bottles Performed at [place] [address], [place], [place] [address]    Culture PENDING    Report Status PENDING   Lactic acid, plasma     Status: Abnormal   Collection Time: 02/12/19  5:31 PM  Result Value Ref Range   Lactic Acid, Venous 2.6 (HH) 0.5 - 1.9 mmol/L    Comment: CRITICAL RESULT CALLED TO, READ BACK BY AND VERIFIED WITH: Jolaine Artist 02/12/19 1947 RHOLMES Performed at [facility], [place] [address] [place], [place] [address]   Troponin I (High Sensitivity)     Status: None   Collection Time: 02/12/19  5:31 PM  Result Value Ref Range   Troponin I (High Sensitivity) 8 <18 ng/L    Comment: (NOTE) Elevated high sensitivity troponin I (hsTnI) values and significant  changes across serial measurements may suggest ACS but many other  chronic and acute conditions are known to elevate hsTnI results.  Refer to the "Links" section for chest pain algorithms and additional  guidance. Performed at [facility], [place] [address] [place], [place] [address]   ABO/Rh     Status: None   Collection Time: 02/12/19  7:39 PM  Result Value Ref Range   ABO/RH(D)      O POS Performed at [facility], [place] [address], [place], [place] [address]   CBG monitoring, ED     Status: Abnormal   Collection Time: 02/12/19 10:06 PM   Result Value Ref Range   Glucose-Capillary 328 (H) 70 - 99 mg/dL  Lactic acid, plasma     Status: None   Collection Time: 02/12/19 11:00 PM  Result Value Ref Range   Lactic Acid, Venous 1.3 0.5 - 1.9 mmol/L    Comment: Performed at [facility], [place] [address] [place], [place] [address]   DG Chest Port 1 View  Result Date: 02/12/2019 CLINICAL DATA:  68 year old female with COVID EXAM: PORTABLE CHEST 1 VIEW COMPARISON:  02/09/2019 FINDINGS: Cardiomediastinal silhouette unchanged in size and contour. Low lung volumes with multifocal reticulonodular opacities, worsened from the comparison. No pneumothorax. No pleural effusion. IMPRESSION: Worsening multifocal reticulonodular opacities, compatible with multifocal infection. Electronically Signed   By: Corrie Mckusick D.O.   On: 02/12/2019 15:38    Pending Labs Unresulted Labs (From admission, onward)    Start     Ordered   02/13/19 0500  CBC with Differential/Platelet  Daily,   R     02/12/19 1742   02/13/19 0500  Comprehensive metabolic panel  Daily,   R     02/12/19 1742   02/13/19 0500  C-reactive protein  Daily,   R     02/12/19 1742   02/13/19 0500  D-dimer, quantitative (not at [facility])  Daily,   R  02/12/19 1742   02/13/19 0500  Ferritin  Daily,   R     02/12/19 1742   02/13/19 0500  Magnesium  Daily,   R     02/12/19 1742   02/13/19 0500  Phosphorus  Daily,   R     02/12/19 1742   02/12/19 1505  Blood Culture (routine x 2)  BLOOD CULTURE X 2,   STAT     02/12/19 1505          Vitals/Pain Today's Vitals   02/13/19 0000 02/13/19 0030 02/13/19 0056 02/13/19 0100  BP: 110/68 116/72  105/67  Pulse: 74 76 76 75  Resp: (!) 28 (!) 23 12 (!) 22  Temp:      TempSrc:      SpO2: 93% 93% 94% 94%  Weight:      Height:      PainSc:        Isolation Precautions Airborne and Contact precautions  Medications Medications  enoxaparin (LOVENOX) injection 40 mg (40 mg Subcutaneous Given 02/12/19 2139)   dexamethasone (DECADRON) injection 6 mg (has no administration in time range)  ascorbic acid (VITAMIN C) tablet 500 mg (500 mg Oral Given 02/12/19 2137)  zinc sulfate capsule 220 mg (220 mg Oral Given 02/12/19 2137)  remdesivir 200 mg in sodium chloride 0.9% 250 mL IVPB (0 mg Intravenous Stopped 02/12/19 2047)    Followed by  remdesivir 100 mg in sodium chloride 0.9 % 100 mL IVPB (has no administration in time range)  acetaminophen (TYLENOL) tablet 650 mg (has no administration in time range)  zolpidem (AMBIEN) tablet 5 mg (has no administration in time range)  senna-docusate (Senokot-S) tablet 1 tablet (has no administration in time range)  bisacodyl (DULCOLAX) EC tablet 5 mg (has no administration in time range)  insulin aspart (novoLOG) injection 0-9 Units (has no administration in time range)  insulin aspart (novoLOG) injection 0-5 Units (4 Units Subcutaneous Given 02/12/19 2219)  aspirin chewable tablet 81 mg (81 mg Oral Given 02/12/19 2139)  amLODipine (NORVASC) tablet 10 mg (10 mg Oral Given 02/12/19 2138)  atorvastatin (LIPITOR) tablet 40 mg (40 mg Oral Given 02/12/19 2136)  carvedilol (COREG) tablet 3.125 mg (has no administration in time range)  isosorbide mononitrate (IMDUR) 24 hr tablet 30 mg (30 mg Oral Given 02/12/19 2139)  nitroGLYCERIN (NITROSTAT) SL tablet 0.4 mg (has no administration in time range)  insulin glargine (LANTUS) injection 20 Units (has no administration in time range)  gabapentin (NEURONTIN) capsule 600 mg (600 mg Oral Given 02/12/19 2135)  benzonatate (TESSALON) capsule 200 mg (200 mg Oral Given 02/12/19 2135)  guaiFENesin (MUCINEX) 12 hr tablet 600 mg (600 mg Oral Given 02/12/19 2137)  sodium chloride 0.9 % bolus 1,000 mL (0 mLs Intravenous Stopped 02/12/19 1719)  ondansetron (ZOFRAN) injection 4 mg (4 mg Intravenous Given 02/12/19 1556)  dexamethasone (DECADRON) injection 6 mg (6 mg Intravenous Given 02/12/19 1600)  azithromycin (ZITHROMAX) 500 mg in  sodium chloride 0.9 % 250 mL IVPB (0 mg Intravenous Stopped 02/12/19 1922)  cefTRIAXone (ROCEPHIN) 1 g in sodium chloride 0.9 % 100 mL IVPB (0 g Intravenous Stopped 02/12/19 1719)  tocilizumab (ACTEMRA) 700 mg in sodium chloride 0.9 % 100 mL infusion (0 mg Intravenous Stopped 02/12/19 2311)  sodium chloride 0.9 % bolus 250 mL (0 mLs Intravenous Stopped 02/12/19 2141)    Mobility walks

## 2019-02-13 NOTE — ED Notes (Signed)
Spoke with pt, pt states she is feeling much better. Pt is 94% [place] 5L when talking with pt.

## 2019-02-13 NOTE — Progress Notes (Signed)
Family Update  Called patient daughter Yvetta Coder to give update on patient status and review POC. Daughter did not answer phone, left message for return call at her earliest convince to speak with primary nurse.

## 2019-02-13 NOTE — ED Notes (Signed)
Pt 88% on 5L [place] while sleeping. Dr. Humphrey Rolls paged.

## 2019-02-13 NOTE — Progress Notes (Signed)
PROGRESS NOTE    Heather [name]  [id number] DOB: 1950/02/26 DOA: 02/12/2019 PCP: Richarda Osmond, DO    Brief Narrative:  68 year old female who presented with worsening dyspnea.  She is homeless and lives in a shelter, her significant past medical history includes hypertension, dyslipidemia, type 2 diabetes mellitus and diabetic neuropathy.  On December 14 she was taken to the [facility], ED due to 4 days of malaise and generalized weakness, she was diagnosed with pneumonia, COVID-19 test was requested and patient was discharged home with azithromycin.  4 days later she returned to the hospital due to persistent symptoms, when EMS arrived to the shelter she was hypotensive and hypoxic. On her initial physical examination her blood pressure in the emergency room was 141/77, pulse rate 84, respiratory rate 24, oxygen saturation 89%, her lungs had scattered wheezing and rales, heart S1-S2 present and rhythmic, abdomen soft, no lower extremity edema. Sodium 137, potassium 3.7, chloride 100, bicarb 24, glucose 357, BUN 49, creatinine 1.8, white count 9.7, hemoglobin 12.6, hematocrit 39.7, platelets 279.  SARS COVID-19 positive.  Chest radiograph had bilateral interstitial infiltrates, right upper lobe, right lower lobe and left lower lobe.  90 bpm, normal axis, normal intervals, sinus rhythm with brief episodes of SVT, no ST segment or T wave changes.  Patient was admitted to the hospital with a working diagnosis of acute hypoxic respiratory failure due to SARS COVID-19 viral pneumonia.   Assessment & Plan:   Principal Problem:   Pneumonia due to COVID-19 virus Active Problems:   HTN (hypertension)   Uncontrolled diabetes mellitus with diabetic neuropathy, with long-term current use of insulin (HCC)   Neuropathy   HLD (hyperlipidemia)   GERD (gastroesophageal reflux disease)   Acute respiratory disease due to COVID-19 virus   AKI (acute kidney injury) ([place])   1.  Acute hypoxic  respiratory failure due to SARS COVID-19 viral pneumonia/ Tocilizumab 12/18.   RR: 25 to 30  Pulse oxymetry: 90 to 98 %  Fi02: 5 L/ min per [place]  COVID-19 Labs  Recent Labs    02/12/19 1538 02/13/19 0653  DDIMER 2.22* 2.43*  FERRITIN 1,240* 1,225*  LDH 306*  --   CRP 12.4* 12.8*    Lab Results  Component Value Date   SARSCOV2NAA DETECTED (A) 02/09/2019   Stable inflammatory markers.  Continue medical therapy with Remdesivir #2/5 and systemic corticosteroids with dexamethasone. Continue antitussive agents, bronchodilators and airway clearing techniques with flutter valve and inceptive spirometer.   No signs of bacterial coinfection, will continue to hold on antibiotic therapy.   2. Uncontrolled T2DM (Hgb A1c 7.6) with steroid induced hyperglycemia. Dyslipidemia. Continue glucose cover and monitoring with insulin sliding scale and basal insulin of 20 units. Patient with poor appetite. Continue statin therapy.   3. HTN. Will continue blood pressure control with isosorbide, amlodipine and carvedilol.     DVT prophylaxis: enoxaparin   Code Status:  full Family Communication: no family at the bedside  Disposition Plan/ discharge barriers: pending clinical; improvement.    Subjective: Patient continue to have dyspnea, not significant cough, no nausea or vomiting, no chest pain. Continue to be very weak and deconditioned.   Objective: Vitals:   02/13/19 0428 02/13/19 0430 02/13/19 0536 02/13/19 0746  BP:  102/67 117/71 123/74  Pulse:  74 79 76  Resp:  (!) 26 (!) 25 20  Temp:    (!) 97.4 F (36.3 C)  TempSrc:    Oral  SpO2: 91%  98% 90%  Weight:  90.2 kg   Height:   5\' 7"  (1.702 m)     Intake/Output Summary (Last 24 hours) at 02/13/2019 0912 Last data filed at 02/12/2019 2311 Gross per 24 hour  Intake 1950 ml  Output --  Net 1950 ml   Filed Weights   02/12/19 2045 02/13/19 0536  Weight: 92.1 kg 90.2 kg    Examination:   General: Not in pain or dyspnea,  deconditioned  Neurology: Awake and alert, non focal  E ENT: mild pallor, no icterus, oral mucosa moist Cardiovascular: No JVD. S1-S2 present, rhythmic, no gallops, rubs, or murmurs. No lower extremity edema. Pulmonary: positive breath sounds bilaterally, adequate air movement, no wheezing, rhonchi or rales. Gastrointestinal. Abdomen with no organomegaly, non tender, no rebound or guarding Skin. No rashes Musculoskeletal: no joint deformities     Data Reviewed: I have personally reviewed following labs and imaging studies  CBC: Recent Labs  Lab 02/09/19 0702 02/12/19 1538 02/13/19 0653  WBC 7.1 9.7 7.6  NEUTROABS 5.2 8.5* 6.5  HGB 14.5 12.6 11.6*  HCT 44.5 39.7 36.5  MCV 85.7 85.7 86.9  PLT 255 279 [id number]   Basic Metabolic Panel: Recent Labs  Lab 02/09/19 0702 02/12/19 1538 02/13/19 0653  NA 139 137 140  K 4.7 3.7 4.4  CL 101 100 105  CO2 21* 24 24  GLUCOSE 245* 357* 349*  BUN 32* 49* 45*  CREATININE 2.25* 1.80* 1.60*  CALCIUM 8.8* 8.1* 7.9*  MG  --   --  2.1  PHOS  --   --  2.4*   GFR: Estimated Creatinine Clearance: 38.8 mL/min (A) (by C-G formula based on SCr of 1.6 mg/dL (H)). Liver Function Tests: Recent Labs  Lab 02/09/19 0702 02/12/19 1538 02/13/19 0653  AST 33 29 25  ALT 26 26 24   ALKPHOS 68 60 57  BILITOT 0.9 0.7 0.4  PROT 8.4* 7.6 7.0  ALBUMIN 3.7 2.8* 2.5*   No results for input(s): LIPASE, AMYLASE in the last 168 hours. No results for input(s): AMMONIA in the last 168 hours. Coagulation Profile: No results for input(s): INR, PROTIME in the last 168 hours. Cardiac Enzymes: No results for input(s): CKTOTAL, CKMB, CKMBINDEX, TROPONINI in the last 168 hours. BNP (last 3 results) No results for input(s): PROBNP in the last 8760 hours. HbA1C: Recent Labs    02/12/19 1538  HGBA1C 9.4*   CBG: Recent Labs  Lab 02/09/19 0858 02/12/19 2206 02/13/19 0746  GLUCAP 206* 328* 291*   Lipid Profile: Recent Labs    02/12/19 1538  TRIG 140    Thyroid Function Tests: No results for input(s): TSH, T4TOTAL, FREET4, T3FREE, THYROIDAB in the last 72 hours. Anemia Panel: Recent Labs    02/12/19 1538 02/13/19 0653  FERRITIN 1,240* 1,225*      Radiology Studies: I have reviewed all of the imaging during this hospital visit personally     Scheduled Meds: . amLODipine  10 mg Oral Daily  . vitamin C  500 mg Oral Daily  . aspirin  81 mg Oral Daily  . atorvastatin  40 mg Oral Daily  . benzonatate  200 mg Oral TID  . carvedilol  3.125 mg Oral BID WC  . dexamethasone (DECADRON) injection  6 mg Intravenous Q24H  . enoxaparin (LOVENOX) injection  40 mg Subcutaneous Q24H  . feeding supplement (ENSURE ENLIVE)  237 mL Oral BID BM  . gabapentin  600 mg Oral QHS  . guaiFENesin  600 mg Oral BID  . insulin aspart  0-5  Units Subcutaneous QHS  . insulin aspart  0-9 Units Subcutaneous TID WC  . insulin glargine  20 Units Subcutaneous Daily  . isosorbide mononitrate  30 mg Oral Daily  . zinc sulfate  220 mg Oral Daily   Continuous Infusions: . remdesivir 100 mg in NS 100 mL       LOS: 1 day        Chanin Frumkin Gerome Apley, MD

## 2019-02-13 NOTE — ED Notes (Signed)
Per Dr. Humphrey Rolls, pt can be increased to 6L if needed.

## 2019-02-14 DIAGNOSIS — E782 Mixed hyperlipidemia: Secondary | ICD-10-CM

## 2019-02-14 LAB — COMPREHENSIVE METABOLIC PANEL
ALT: 23 U/L (ref 0–44)
AST: 26 U/L (ref 15–41)
Albumin: 2.4 g/dL — ABNORMAL LOW (ref 3.5–5.0)
Alkaline Phosphatase: 58 U/L (ref 38–126)
Anion gap: 10 (ref 5–15)
BUN: 57 mg/dL — ABNORMAL HIGH (ref 8–23)
CO2: 24 mmol/L (ref 22–32)
Calcium: 8.2 mg/dL — ABNORMAL LOW (ref 8.9–10.3)
Chloride: 107 mmol/L (ref 98–111)
Creatinine, Ser: 1.68 mg/dL — ABNORMAL HIGH (ref 0.44–1.00)
GFR calc Af Amer: 36 mL/min — ABNORMAL LOW (ref >60)
GFR calc non Af Amer: 31 mL/min — ABNORMAL LOW (ref >60)
Glucose, Bld: 287 mg/dL — ABNORMAL HIGH (ref 70–99)
Potassium: 4 mmol/L (ref 3.5–5.1)
Sodium: 141 mmol/L (ref 135–145)
Total Bilirubin: 0.4 mg/dL (ref 0.3–1.2)
Total Protein: 6.6 g/dL (ref 6.5–8.1)

## 2019-02-14 LAB — GLUCOSE, CAPILLARY: Glucose-Capillary: 366 mg/dL — ABNORMAL HIGH (ref 70–99)

## 2019-02-14 LAB — FERRITIN: Ferritin: 1384 ng/mL — ABNORMAL HIGH (ref 11–307)

## 2019-02-14 LAB — D-DIMER, QUANTITATIVE: D-Dimer, Quant: 1.81 ug/mL-FEU — ABNORMAL HIGH (ref 0.00–0.50)

## 2019-02-14 LAB — C-REACTIVE PROTEIN: CRP: 8.1 mg/dL — ABNORMAL HIGH (ref <1.0)

## 2019-02-14 MED ORDER — INSULIN ASPART 100 UNIT/ML ~~LOC~~ SOLN
20.0000 [IU] | Freq: Once | SUBCUTANEOUS | Status: AC
  Administered 2019-02-14: 20 [IU] via SUBCUTANEOUS

## 2019-02-14 MED ORDER — INSULIN ASPART 100 UNIT/ML ~~LOC~~ SOLN
0.0000 [IU] | Freq: Three times a day (TID) | SUBCUTANEOUS | Status: DC
  Administered 2019-02-15 (×2): 11 [IU] via SUBCUTANEOUS
  Administered 2019-02-15: 8 [IU] via SUBCUTANEOUS
  Administered 2019-02-16 (×2): 3 [IU] via SUBCUTANEOUS
  Administered 2019-02-16 (×2): 15 [IU] via SUBCUTANEOUS
  Administered 2019-02-17: 5 [IU] via SUBCUTANEOUS
  Administered 2019-02-17: 8 [IU] via SUBCUTANEOUS
  Administered 2019-02-17: 15 [IU] via SUBCUTANEOUS
  Administered 2019-02-18: 8 [IU] via SUBCUTANEOUS
  Administered 2019-02-18: 5 [IU] via SUBCUTANEOUS
  Administered 2019-02-18 – 2019-02-19 (×2): 3 [IU] via SUBCUTANEOUS
  Administered 2019-02-19: 15 [IU] via SUBCUTANEOUS
  Administered 2019-02-19: 3 [IU] via SUBCUTANEOUS
  Administered 2019-02-19: 11 [IU] via SUBCUTANEOUS
  Administered 2019-02-20: 15 [IU] via SUBCUTANEOUS
  Administered 2019-02-20: 11 [IU] via SUBCUTANEOUS
  Administered 2019-02-21: 3 [IU] via SUBCUTANEOUS
  Administered 2019-02-21: 11 [IU] via SUBCUTANEOUS
  Administered 2019-02-21 – 2019-02-22 (×2): 3 [IU] via SUBCUTANEOUS
  Administered 2019-02-22: 11 [IU] via SUBCUTANEOUS
  Administered 2019-02-22: 5 [IU] via SUBCUTANEOUS
  Administered 2019-02-23: 3 [IU] via SUBCUTANEOUS
  Administered 2019-02-23: 5 [IU] via SUBCUTANEOUS

## 2019-02-14 MED ORDER — GUAIFENESIN-DM 100-10 MG/5ML PO SYRP: 5.0000 mL | ORAL_SOLUTION | ORAL | Status: DC | PRN

## 2019-02-14 MED ORDER — ADULT MULTIVITAMIN W/MINERALS CH
1.0000 | ORAL_TABLET | Freq: Every day | ORAL | Status: DC
  Administered 2019-02-14 – 2019-02-23 (×10): 1 via ORAL
  Filled 2019-02-14 (×10): qty 1

## 2019-02-14 MED ORDER — LIP MEDEX EX OINT
TOPICAL_OINTMENT | CUTANEOUS | Status: DC | PRN
  Filled 2019-02-14: qty 7

## 2019-02-14 MED ORDER — ENSURE ENLIVE PO LIQD
237.0000 mL | ORAL | Status: DC
  Administered 2019-02-15 – 2019-02-17 (×3): 237 mL via ORAL

## 2019-02-14 MED ORDER — PRO-STAT SUGAR FREE PO LIQD
30.0000 mL | Freq: Three times a day (TID) | ORAL | Status: DC
  Administered 2019-02-15 – 2019-02-23 (×18): 30 mL via ORAL
  Filled 2019-02-14 (×19): qty 30

## 2019-02-14 MED ORDER — HYDROCOD POLST-CPM POLST ER 10-8 MG/5ML PO SUER
5.0000 mL | Freq: Two times a day (BID) | ORAL | Status: DC
  Administered 2019-02-14 – 2019-02-23 (×17): 5 mL via ORAL
  Filled 2019-02-14 (×17): qty 5

## 2019-02-14 NOTE — Progress Notes (Signed)
PROGRESS NOTE    Heather [name]  [id number] DOB: 02-05-1951 DOA: 02/12/2019 PCP: Richarda Osmond, DO    Brief Narrative:  68 year old female who presented with worsening dyspnea.  She is homeless and lives in a shelter, her significant past medical history includes hypertension, dyslipidemia, type 2 diabetes mellitus and diabetic neuropathy.  On December 14 she was taken to the [facility], ED due to 4 days of malaise and generalized weakness, she was diagnosed with pneumonia, COVID-19 test was requested and patient was discharged home with azithromycin.  4 days later she returned to the hospital due to persistent symptoms, when EMS arrived to the shelter she was hypotensive and hypoxic. On her initial physical examination her blood pressure in the emergency room was 141/77, pulse rate 84, respiratory rate 24, oxygen saturation 89%, her lungs had scattered wheezing and rales, heart S1-S2 present and rhythmic, abdomen soft, no lower extremity edema. Sodium 137, potassium 3.7, chloride 100, bicarb 24, glucose 357, BUN 49, creatinine 1.8, white count 9.7, hemoglobin 12.6, hematocrit 39.7, platelets 279.  SARS COVID-19 positive.  Chest radiograph had bilateral interstitial infiltrates, right upper lobe, right lower lobe and left lower lobe.  90 bpm, normal axis, normal intervals, sinus rhythm with brief episodes of SVT, no ST segment or T wave changes.  Patient was admitted to the hospital with a working diagnosis of acute hypoxic respiratory failure due to SARS COVID-19 viral pneumonia.   Assessment & Plan:   Principal Problem:   Pneumonia due to COVID-19 virus Active Problems:   HTN (hypertension)   Uncontrolled diabetes mellitus with diabetic neuropathy, with long-term current use of insulin (HCC)   Neuropathy   HLD (hyperlipidemia)   GERD (gastroesophageal reflux disease)   Acute respiratory disease due to COVID-19 virus   AKI (acute kidney injury) ([place])    1.  Acute hypoxic  respiratory failure due to SARS COVID-19 viral pneumonia/ Tocilizumab 12/18.   RR: 18  Pulse oxymetry: 92%  Fi02: 5 L/ min per [place]   COVID-19 Labs  Recent Labs    02/12/19 1538 02/13/19 0653 02/14/19 0444  DDIMER 2.22* 2.43* 1.81*  FERRITIN 1,240* 1,225* 1,384*  LDH 306*  --   --   CRP 12.4* 12.8* 8.1*    Lab Results  Component Value Date   SARSCOV2NAA DETECTED (A) 02/09/2019     Trending down inflammatory markers.  Tolerating well medical therapy with Remdesivir #3/5 (AST 26 -ALT 23),  systemic corticosteroids with dexamethasone 6 mg q 24 H. On antitussive agents, bronchodilators and airway clearing techniques with flutter valve and inceptive spirometer.   Out of bed to chair tid with meals, physical and occupation therapy evaluation.    2. Uncontrolled T2DM (Hgb A1c 7.6) with steroid induced hyperglycemia. Dyslipidemia. Fasting glucose is 287. Will increase to moderate, insulin sliding scale. For now will continue with basal insulin of 20 units daily. Continue with statin.  3. HTN. On isosorbide, amlodipine and carvedilol for blood pressure control.   4. AKI. Renal function with serum cr trending down to 1,68 with K at 4,0 and serum bicarbonate at 24, will continue close monitoring, avoid nephrotoxic medications and hypotension. Hold on IV fluids for now. Patient is toleration po.    DVT prophylaxis: enoxaparin   Code Status:  full Family Communication: no family at the bedside  Disposition Plan/ discharge barriers: pending clinical; improvement.    Subjective: Patient is feeling better, not yet back to her baseline, continue to have dyspnea and generalized weakness. Has not  been out of bed yet.   Objective: Vitals:   02/13/19 2142 02/14/19 0000 02/14/19 0400 02/14/19 0800  BP: 127/77 101/71 109/69 121/71  Pulse: 91 73 60 (!) 58  Resp:   18 18  Temp: 98.6 F (37 C) 98.2 F (36.8 C) (!) 97.4 F (36.3 C) (!) 97.5 F (36.4 C)  TempSrc: Oral Oral Oral  Oral  SpO2: 92% 90% 90% 92%  Weight:      Height:        Intake/Output Summary (Last 24 hours) at 02/14/2019 0915 Last data filed at 02/13/2019 1800 Gross per 24 hour  Intake 580 ml  Output --  Net 580 ml   Filed Weights   02/12/19 2045 02/13/19 0536  Weight: 92.1 kg 90.2 kg    Examination:   General: Not in pain or dyspnea, deconditioned  Neurology: Awake and alert, non focal  E ENT: mild pallor, no icterus, oral mucosa moist Cardiovascular: No JVD. S1-S2 present, rhythmic, no gallops, rubs, or murmurs. No lower extremity edema. Pulmonary: vpositive breath sounds bilaterally. Gastrointestinal. Abdomen with no organomegaly, non tender, no rebound or guarding Skin. No rashes Musculoskeletal: no joint deformities     Data Reviewed: I have personally reviewed following labs and imaging studies  CBC: Recent Labs  Lab 02/09/19 0702 02/12/19 1538 02/13/19 0653  WBC 7.1 9.7 7.6  NEUTROABS 5.2 8.5* 6.5  HGB 14.5 12.6 11.6*  HCT 44.5 39.7 36.5  MCV 85.7 85.7 86.9  PLT 255 279 [id number]   Basic Metabolic Panel: Recent Labs  Lab 02/09/19 0702 02/12/19 1538 02/13/19 0653 02/14/19 0444  NA 139 137 140 141  K 4.7 3.7 4.4 4.0  CL 101 100 105 107  CO2 21* 24 24 24   GLUCOSE 245* 357* 349* 287*  BUN 32* 49* 45* 57*  CREATININE 2.25* 1.80* 1.60* 1.68*  CALCIUM 8.8* 8.1* 7.9* 8.2*  MG  --   --  2.1  --   PHOS  --   --  2.4*  --    GFR: Estimated Creatinine Clearance: 36.9 mL/min (A) (by C-G formula based on SCr of 1.68 mg/dL (H)). Liver Function Tests: Recent Labs  Lab 02/09/19 0702 02/12/19 1538 02/13/19 0653 02/14/19 0444  AST 33 29 25 26   ALT 26 26 24 23   ALKPHOS 68 60 57 58  BILITOT 0.9 0.7 0.4 0.4  PROT 8.4* 7.6 7.0 6.6  ALBUMIN 3.7 2.8* 2.5* 2.4*   No results for input(s): LIPASE, AMYLASE in the last 168 hours. No results for input(s): AMMONIA in the last 168 hours. Coagulation Profile: No results for input(s): INR, PROTIME in the last 168  hours. Cardiac Enzymes: No results for input(s): CKTOTAL, CKMB, CKMBINDEX, TROPONINI in the last 168 hours. BNP (last 3 results) No results for input(s): PROBNP in the last 8760 hours. HbA1C: Recent Labs    02/12/19 1538  HGBA1C 9.4*   CBG: Recent Labs  Lab 02/09/19 0858 02/12/19 2206 02/13/19 0746 02/13/19 1100 02/13/19 2149  GLUCAP 206* 328* 291* 311* 372*   Lipid Profile: Recent Labs    02/12/19 1538  TRIG 140   Thyroid Function Tests: No results for input(s): TSH, T4TOTAL, FREET4, T3FREE, THYROIDAB in the last 72 hours. Anemia Panel: Recent Labs    02/13/19 0653 02/14/19 0444  FERRITIN 1,225* 1,384*      Radiology Studies: I have reviewed all of the imaging during this hospital visit personally     Scheduled Meds: . amLODipine  10 mg Oral Daily  . vitamin C  500 mg Oral Daily  . aspirin  81 mg Oral Daily  . atorvastatin  40 mg Oral Daily  . benzonatate  200 mg Oral TID  . carvedilol  3.125 mg Oral BID WC  . dexamethasone (DECADRON) injection  6 mg Intravenous Q24H  . enoxaparin (LOVENOX) injection  40 mg Subcutaneous Q24H  . feeding supplement (ENSURE ENLIVE)  237 mL Oral BID BM  . gabapentin  600 mg Oral QHS  . guaiFENesin  600 mg Oral BID  . insulin aspart  0-5 Units Subcutaneous QHS  . insulin aspart  0-9 Units Subcutaneous TID WC  . insulin glargine  20 Units Subcutaneous Daily  . isosorbide mononitrate  30 mg Oral Daily  . zinc sulfate  220 mg Oral Daily   Continuous Infusions: . remdesivir 100 mg in NS 100 mL Stopped (02/13/19 1135)     LOS: 2 days        Anakaren Campion Gerome Apley, MD

## 2019-02-14 NOTE — Progress Notes (Signed)
Initial Nutrition Assessment  DOCUMENTATION CODES:   Obesity unspecified  INTERVENTION:   -Ensure Enlive po daily, each supplement provides 350 kcal and 20 grams of protein -Prostat liquid protein PO 30 ml TID with meals, each supplement provides 100 kcal, 15 grams protein. -Multivitamin with minerals daily  -Pt receiving Hormel Shake daily with Breakfast which provides 520 kcals and 22 g of protein and Magic cup BID with lunch and dinner, each supplement provides 290 kcal and 9 grams of protein, automatically on meal trays to optimize nutritional intake.  NUTRITION DIAGNOSIS:   Increased nutrient needs related to acute illness(COVID-19 infection) as evidenced by estimated needs.  GOAL:   Patient will meet greater than or equal to 90% of their needs  MONITOR:   PO intake, Supplement acceptance, Labs, Weight trends, I & O's  REASON FOR ASSESSMENT:   Malnutrition Screening Tool    ASSESSMENT:   68 year old female who presented with worsening dyspnea.  She is homeless and lives in a shelter, her significant past medical history includes hypertension, dyslipidemia, type 2 diabetes mellitus and diabetic neuropathy.  On December 14 she was taken to the [facility], ED due to 4 days of malaise and generalized weakness, she was diagnosed with pneumonia, COVID-19 test was requested and patient was discharged home with azithromycin.  4 days later she returned to the hospital due to persistent symptoms, when EMS arrived to the shelter she was hypotensive and hypoxic.  **RD working remotely**  Patient currently homeless, lives in a shelter. Pt admitted after developing a fever. Has been drinking fluids but has not been eating well given poor appetite x weeks. PTA pt started to have vomiting and diarrhea.  Pt has been consuming 25% of meals. Pt has not been drinking Ensures ordered. Will decrease to once daily and provide Prostat supplements.  Per weight records, weight has remained  stable over the past year.  I/Os: +2.7L since admit  Medications: Vitamin C tablet, Zinc sulfate capsule Labs reviewed: CBGs: 311-372  NUTRITION - FOCUSED PHYSICAL EXAM:  Working remotely.  Diet Order:   Diet Order            Diet Carb Modified Fluid consistency: Thin; Room service appropriate? Yes  Diet effective now              EDUCATION NEEDS:   No education needs have been identified at this time  Skin:  Skin Assessment: Reviewed RN Assessment  Last BM:  12/20 -type 6  Height:   Ht Readings from Last 1 Encounters:  02/13/19 5\' 7"  (1.702 m)    Weight:   Wt Readings from Last 1 Encounters:  02/13/19 90.2 kg    Ideal Body Weight:  61.3 kg  BMI:  Body mass index is 31.15 kg/m.  Estimated Nutritional Needs:   Kcal:  1700-1900  Protein:  75-90g  Fluid:  1.9L/day  Heather Bibles, MS, RD, LDN Inpatient Clinical Dietitian Pager: [phone] After Hours Pager: [phone]

## 2019-02-15 LAB — COMPREHENSIVE METABOLIC PANEL
ALT: 27 U/L (ref 0–44)
AST: 29 U/L (ref 15–41)
Albumin: 2.6 g/dL — ABNORMAL LOW (ref 3.5–5.0)
Alkaline Phosphatase: 59 U/L (ref 38–126)
Anion gap: 9 (ref 5–15)
BUN: 50 mg/dL — ABNORMAL HIGH (ref 8–23)
CO2: 26 mmol/L (ref 22–32)
Calcium: 8.4 mg/dL — ABNORMAL LOW (ref 8.9–10.3)
Chloride: 103 mmol/L (ref 98–111)
Creatinine, Ser: 1.51 mg/dL — ABNORMAL HIGH (ref 0.44–1.00)
GFR calc Af Amer: 41 mL/min — ABNORMAL LOW (ref >60)
GFR calc non Af Amer: 35 mL/min — ABNORMAL LOW (ref >60)
Glucose, Bld: 310 mg/dL — ABNORMAL HIGH (ref 70–99)
Potassium: 4.5 mmol/L (ref 3.5–5.1)
Sodium: 138 mmol/L (ref 135–145)
Total Bilirubin: 0.7 mg/dL (ref 0.3–1.2)
Total Protein: 6.4 g/dL — ABNORMAL LOW (ref 6.5–8.1)

## 2019-02-15 LAB — GLUCOSE, CAPILLARY
Glucose-Capillary: 320 mg/dL — ABNORMAL HIGH (ref 70–99)
Glucose-Capillary: 310 mg/dL — ABNORMAL HIGH (ref 70–99)
Glucose-Capillary: 457 mg/dL — ABNORMAL HIGH (ref 70–99)
Glucose-Capillary: 287 mg/dL — ABNORMAL HIGH (ref 70–99)
Glucose-Capillary: 322 mg/dL — ABNORMAL HIGH (ref 70–99)
Glucose-Capillary: 304 mg/dL — ABNORMAL HIGH (ref 70–99)
Glucose-Capillary: 287 mg/dL — ABNORMAL HIGH (ref 70–99)

## 2019-02-15 LAB — C-REACTIVE PROTEIN: CRP: 3.9 mg/dL — ABNORMAL HIGH (ref <1.0)

## 2019-02-15 LAB — D-DIMER, QUANTITATIVE: D-Dimer, Quant: 2.24 ug/mL-FEU — ABNORMAL HIGH (ref 0.00–0.50)

## 2019-02-15 LAB — FERRITIN: Ferritin: 1143 ng/mL — ABNORMAL HIGH (ref 11–307)

## 2019-02-15 MED ORDER — INSULIN ASPART 100 UNIT/ML ~~LOC~~ SOLN
5.0000 [IU] | Freq: Three times a day (TID) | SUBCUTANEOUS | Status: DC
  Administered 2019-02-15 – 2019-02-23 (×25): 5 [IU] via SUBCUTANEOUS

## 2019-02-15 MED ORDER — INSULIN GLARGINE 100 UNIT/ML ~~LOC~~ SOLN
25.0000 [IU] | Freq: Every day | SUBCUTANEOUS | Status: DC
  Administered 2019-02-16 – 2019-02-23 (×8): 25 [IU] via SUBCUTANEOUS
  Filled 2019-02-15 (×8): qty 0.25

## 2019-02-15 NOTE — Progress Notes (Signed)
Inpatient Diabetes Program Recommendations  AACE/ADA: New Consensus Statement on Inpatient Glycemic Control (2015)  Target Ranges:  Prepandial:   less than 140 mg/dL      Peak postprandial:   less than 180 mg/dL (1-2 hours)      Critically ill patients:  140 - 180 mg/dL   Lab Results  Component Value Date   GLUCAP 287 (H) 02/15/2019   HGBA1C 9.4 (H) 02/12/2019    Review of Glycemic Control  Diabetes history: DM 2 Outpatient Diabetes medications: Tresiba 32 units, Victoza 1.8 mg Daily, Metformin 1000 mg bid Current orders for Inpatient glycemic control:  Lantus 20 units Novolog 0-15 units tid  A1c 9.4% on 12/18 Decadron 6 mg Q24 hours Supplements: Ensure Enlive tid between meals BUN/Creat: 50/1.51  Inpatient Diabetes Program Recommendations:    -  Consider increasing Lantus to 25 units -  Consider adding Novolog 5 units tid meal coverage if eating >50% of meals.  Thanks,  Tama Headings RN, MSN, BC-ADM Inpatient Diabetes Coordinator Team Pager [phone] (8a-5p)

## 2019-02-15 NOTE — Evaluation (Signed)
Physical Therapy Evaluation Patient Details Name: Heather [name] MRN: [id number] DOB: [birth year] Today's Date: 02/15/2019   History of Present Illness  68 year old female who presented with worsening dyspnea.  She is homeless and lives in a shelter, her significant past medical history includes hypertension, dyslipidemia, type 2 diabetes mellitus and diabetic neuropathy. On December 14 she was taken to the [facility], ED due to 4 days of malaise and generalized weakness, she was diagnosed with pneumonia, COVID-19 test was requested and patient was discharged home with azithromycin.  4 days later she returned to the hospital due to persistent symptoms, when EMS arrived to the shelter she was hypotensive and hypoxic.  Clinical Impression   Pt admitted with above diagnosis. PTA was living in a shelter but was very independent. Pt currently with functional limitations due to the deficits listed below (see PT Problem List). This pm is needing min a to min guard assist with mobility, was able to ambulate approx 14ft with RW and min guard assist. Pt was on 6L/min via [place] and was noted to desat to min 84% while ambulating, with cues for pursed lip breathing able to recover saturations to low 90s. Pt will benefit from skilled PT to increase their independence and safety with mobility to allow discharge to the venue listed below.       Follow Up Recommendations Home health PT    Equipment Recommendations  Rolling walker with 5" wheels    Recommendations for Other Services       Precautions / Restrictions Precautions Precautions: Fall Precaution Comments: watch O2 sats Restrictions Weight Bearing Restrictions: No      Mobility  Bed Mobility Overal bed mobility: Needs Assistance Bed Mobility: Supine to Sit     Supine to sit: Min guard     General bed mobility comments: pt found sitting in recliner upon therapist arrival  Transfers Overall transfer level: Needs assistance Equipment used:  Rolling walker (2 wheeled) Transfers: Sit to/from Stand Sit to Stand: Min assist         General transfer comment: form lower surface; uses momentum; poorly controlled descent to [employer]  Ambulation/Gait Ambulation/Gait assistance: Min guard Gait Distance (Feet): 150 Feet Assistive device: Rolling walker (2 wheeled) Gait Pattern/deviations: Step-through pattern;Narrow base of support Gait velocity: decreased   General Gait Details: ambulated on 6L/min via [place] and was noted to desat to min 84%  Stairs            Wheelchair Mobility    Modified Rankin (Stroke Patients Only)       Balance Overall balance assessment: Needs assistance Sitting-balance support: Feet supported Sitting balance-Leahy Scale: Good       Standing balance-Leahy Scale: Fair Standing balance comment: reliant on external support                             Pertinent Vitals/Pain Pain Assessment: No/denies pain    Home Living Family/patient expects to be discharged to:: Shelter/Homeless                      Prior Function Level of Independence: Independent         Comments: has 68 yo who lives with her in the shelter     Hand Dominance   Dominant Hand: Right    Extremity/Trunk Assessment   Upper Extremity Assessment Upper Extremity Assessment: Generalized weakness    Lower Extremity Assessment Lower Extremity Assessment: Generalized weakness  Cervical / Trunk Assessment Cervical / Trunk Assessment: Normal  Communication   Communication: No difficulties  Cognition Arousal/Alertness: Awake/alert Behavior During Therapy: WFL for tasks assessed/performed Overall Cognitive Status: No family/caregiver present to determine baseline cognitive functioning                                 General Comments: not oriented to day/date; knows she is in a hospital and able to recount her history regarding being sick with Covid; ususally ambulates without  an AD; 73 yo daughter lives with her at the shelter      General Comments      Exercises Other Exercises Other Exercises: began education on seated HEP with level 1 theraband Other Exercises: incentive spirometer x 10 - able to pull 750ml   Assessment/Plan    PT Assessment Patient needs continued PT services  PT Problem List Decreased strength;Decreased activity tolerance;Decreased balance;Decreased mobility;Decreased coordination;Decreased safety awareness       PT Treatment Interventions DME instruction;Gait training;Functional mobility training;Therapeutic activities;Therapeutic exercise;Balance training;Neuromuscular re-education;Patient/family education    PT Goals (Current goals can be found in the Care Plan section)  Acute Rehab PT Goals Patient Stated Goal: to go home to be with her grand daughter PT Goal Formulation: With patient Time For Goal Achievement: 03/01/19 Potential to Achieve Goals: Good    Frequency Min 3X/week   Barriers to discharge   lives in a shelter    Co-evaluation               AM-PAC PT "6 Clicks" Mobility  Outcome Measure Help needed turning from your back to your side while in a flat bed without using bedrails?: A Little Help needed moving from lying on your back to sitting on the side of a flat bed without using bedrails?: A Little Help needed moving to and from a bed to a chair (including a wheelchair)?: A Little Help needed standing up from a chair using your arms (e.g., wheelchair or bedside chair)?: A Little Help needed to walk in hospital room?: A Little Help needed climbing 3-5 steps with a railing? : A Lot 6 Click Score: 17    End of Session Equipment Utilized During Treatment: Oxygen Activity Tolerance: Patient tolerated treatment well Patient left: in chair;with call bell/phone within reach Nurse Communication: Mobility status PT Visit Diagnosis: Unsteadiness on feet (R26.81);Other abnormalities of gait and mobility  (R26.89);Muscle weakness (generalized) (M62.81)    Time: [id number] PT Time Calculation (min) (ACUTE ONLY): 27 min   Charges:   PT Evaluation $PT Eval Moderate Complexity: 1 Mod PT Treatments $Gait Training: 8-22 mins        Horald Chestnut, PT   Delford Field 02/15/2019, 4:08 PM

## 2019-02-15 NOTE — TOC Initial Note (Signed)
Transition of Care [facility]) - Initial/Assessment Note    Patient Details  Name: Heather [name] MRN: [id number] Date of Birth: Oct 17, 1950  Transition of Care (TOC) CM/SW Contact:    Joaquin Courts, RN Phone Number: 02/15/2019, 4:42 PM  Clinical Narrative:   CM received notification from OT that patient is from a homeless shelter and reported that her granddaughter is living at the shelter with her.  CM followed up with patient and patient reported that she has been staying at a shelter located at  [address] where she has lived for a year with her granddaughter who is 68 years old.  Patient was able to provide a phone number for the shelter director Butch Penny [phone]).  CM reached out to Health visitor with permission and verified that patient is staying at the shelter and the director reports it is a transitional housing for patient and they are working on arranging permanent housing. Director dies report that the patients granddaughter lives with her but given that she is underage, her older brother Justice age 9 was contacted and came and picked her up and she will be staying with him but can return when patient returns.  Butch Penny confirms that patient can return to the shelter at discharge and can receive [facility] services at the shelter if needed.  Patient expressed to CM that she wishes to return to the shelter versus going to SNF.  CM reached out to [place] and confirmed that patient's granddaughter is staying with him and he reports she can stay with him as long as necessary.  CM will continue to follow for discharge needs.       Expected Discharge Plan: (uncertain at this time) Barriers to Discharge: Continued Medical Work up   Patient Goals and CMS Choice Patient states their goals for this hospitalization and ongoing recovery are:: to get better and go back to shelter      Expected Discharge Plan and Services Expected Discharge Plan: (uncertain at this time)   Discharge  Planning Services: CM Consult   Living arrangements for the past 2 months: Homeless Shelter                                      Prior Living Arrangements/Services Living arrangements for the past 2 months: Homeless Shelter Lives with:: Other (Comment)(granddaughter) Patient language and need for interpreter reviewed:: Yes Do you feel safe going back to the place where you live?: Yes            Criminal Activity/Legal Involvement Pertinent to Current Situation/Hospitalization: No - Comment as needed  Activities of Daily Living Home Assistive Devices/Equipment: None ADL Screening (condition at time of admission) Patient's cognitive ability adequate to safely complete daily activities?: Yes Is the patient deaf or have difficulty hearing?: No Does the patient have difficulty seeing, even when wearing glasses/contacts?: No Does the patient have difficulty concentrating, remembering, or making decisions?: No Patient able to express need for assistance with ADLs?: No Does the patient have difficulty dressing or bathing?: No Independently performs ADLs?: Yes (appropriate for developmental age) Does the patient have difficulty walking or climbing stairs?: No Weakness of Legs: None Weakness of Arms/Hands: None  Permission Sought/Granted   Permission granted to share information with : Yes, Verbal Permission Granted     Permission granted to share info w AGENCY: [place] director        Emotional Assessment  Attitude/Demeanor/Rapport: Engaged Affect (typically observed): Accepting Orientation: : Oriented to Self, Oriented to Place, Oriented to  Time, Oriented to Situation   Psych Involvement: No (comment)  Admission diagnosis:  Acute respiratory failure with hypoxia ([employer]) [J96.01] COVID-19 virus infection [U07.1] Pneumonia due to COVID-19 virus [U07.1, J12.89] Patient Active Problem List   Diagnosis Date Noted  . Acute respiratory disease due to  COVID-19 virus 02/13/2019  . AKI (acute kidney injury) ([place]) 02/13/2019  . Pneumonia due to COVID-19 virus 02/12/2019  . GERD (gastroesophageal reflux disease) 05/08/2018  . Abn findings on dx imaging of abd regions, inc retroperiton 05/08/2018  . HLD (hyperlipidemia) 04/13/2018  . Food insecurity 04/13/2018  . HTN (hypertension) 01/01/2016  . Uncontrolled diabetes mellitus with diabetic neuropathy, with long-term current use of insulin ([place]) 01/01/2016  . Neuropathy 01/01/2016   PCP:  Richarda Osmond, DO Pharmacy:   [id number] - [place], [place] [id number] [place] [id number] Phone: [phone] Fax: [phone]     Social Determinants of Health (SDOH) Interventions    Readmission Risk Interventions No flowsheet data found.

## 2019-02-15 NOTE — Progress Notes (Signed)
Occupational Therapy Evaluation Patient Details Name: Heather [name] MRN: [id number] DOB: 11-27-1950 Today's Date: 02/15/2019    History of Present Illness 68 year old female who presented with worsening dyspnea.  She is homeless and lives in a shelter, her significant past medical history includes hypertension, dyslipidemia, type 2 diabetes mellitus and diabetic neuropathy. On December 14 she was taken to the [facility], ED due to 4 days of malaise and generalized weakness, she was diagnosed with pneumonia, COVID-19 test was requested and patient was discharged home with azithromycin.  4 days later she returned to the hospital due to persistent symptoms, when EMS arrived to the shelter she was hypotensive and hypoxic.   Clinical Impression   PTA, pt living in a shelter with her 67 yo grand daughter (who goes to [employer]) and was independent with ADL and mobility. Pt able to ambulate to the bathroom however desats to 86 on 6L with 3/4 DOE, requiring 4 rest breaks to complete toileting and pericare. Nsg made aware of need for pt to use BSC rather than Purewick if possible to increase mobility. If pt progresses, she would benefit from [facility], however may need short term SNF pending progress. Discussed with CM. Will follow acutely to facilitate hopeful DC home.    Follow Up Recommendations  SNF;Home health OT;Supervision/Assistance - 24 hour(pending progress)    Equipment Recommendations  3 in 1 bedside commode    Recommendations for Other Services       Precautions / Restrictions Precautions Precautions: Fall Precaution Comments: watch O2 sats      Mobility Bed Mobility Overal bed mobility: Needs Assistance Bed Mobility: Supine to Sit     Supine to sit: Min guard        Transfers Overall transfer level: Needs assistance Equipment used: Rolling walker (2 wheeled) Transfers: Sit to/from Stand Sit to Stand: Min assist         General transfer comment: form lower  surface; uses momentum; poorly controlled descent to [employer] Overall balance assessment: Needs assistance   Sitting balance-Leahy Scale: Good       Standing balance-Leahy Scale: Poor Standing balance comment: reliant on external support                           ADL either performed or assessed with clinical judgement   ADL Overall ADL's : Needs assistance/impaired Eating/Feeding: Modified independent   Grooming: Set up;Sitting Grooming Details (indicate cue type and reason): unable to stand at sink for grooming due to DOE Upper Body Bathing: Set up;Sitting   Lower Body Bathing: Minimal assistance;Sit to/from stand Lower Body Bathing Details (indicate cue type and reason): required 4 rest breaks when bathing peri-area Upper Body Dressing : Set up;Sitting   Lower Body Dressing: Minimal assistance;Sit to/from stand   Toilet Transfer: Minimal assistance;RW;Ambulation;Grab bars Toilet Transfer Details (indicate cue type and reason): more difficulty standing from lower toilet - required use of grab bar Toileting- Clothing Manipulation and Hygiene: Supervision/safety;Sit to/from stand       Functional mobility during ADLs: Minimal assistance;Rolling walker;Cueing for safety General ADL Comments: 3/4 DOE; requiring frequent rest breaks on 6L     Vision         Perception     Praxis      Pertinent Vitals/Pain Pain Assessment: No/denies pain     Hand Dominance Right   Extremity/Trunk Assessment Upper Extremity Assessment Upper Extremity Assessment: Generalized weakness   Lower Extremity Assessment  Lower Extremity Assessment: Defer to PT evaluation   Cervical / Trunk Assessment Cervical / Trunk Assessment: Normal   Communication Communication Communication: No difficulties   Cognition Arousal/Alertness: Awake/alert Behavior During Therapy: WFL for tasks assessed/performed Overall Cognitive Status: No family/caregiver present to determine  baseline cognitive functioning                                 General Comments: not oriented to day/date; knows she is in a hospital and able to recount her history regarding being sick with Covid; ususally ambulates without an AD; 62 yo daughter lives with her at the shelter   General Comments       Exercises Exercises: Other exercises Other Exercises Other Exercises: began education on seated HEP with level 1 theraband Other Exercises: incentive spirometer x 10 - able to pull 779ml   Shoulder Instructions      Home Living Family/patient expects to be discharged to:: Shelter/Homeless                                        Prior Functioning/Environment Level of Independence: Independent        Comments: has 68 yo who lives with her in the shelter        OT Problem List: Decreased strength;Decreased activity tolerance;Impaired balance (sitting and/or standing);Decreased safety awareness;Decreased cognition;Decreased knowledge of use of DME or AE;Cardiopulmonary status limiting activity;Obesity      OT Treatment/Interventions: Self-care/ADL training;Therapeutic exercise;Neuromuscular education;Energy conservation;DME and/or AE instruction;Therapeutic activities;Cognitive remediation/compensation;Patient/family education;Balance training    OT Goals(Current goals can be found in the care plan section) Acute Rehab OT Goals Patient Stated Goal: to go home to be with her grand daughter OT Goal Formulation: With patient Time For Goal Achievement: 03/01/19 Potential to Achieve Goals: Good  OT Frequency: Min 3X/week   Barriers to D/C: (homeless)          Co-evaluation              AM-PAC OT "6 Clicks" Daily Activity     Outcome Measure Help from another person eating meals?: None Help from another person taking care of personal grooming?: A Little Help from another person toileting, which includes using toliet, bedpan, or urinal?: A  Little Help from another person bathing (including washing, rinsing, drying)?: A Little Help from another person to put on and taking off regular upper body clothing?: A Little Help from another person to put on and taking off regular lower body clothing?: A Little 6 Click Score: 19   End of Session Equipment Utilized During Treatment: Rolling walker;Oxygen(4-6 L) Nurse Communication: Mobility status(use BSC)  Activity Tolerance: Patient tolerated treatment well Patient left: in chair;with call bell/phone within reach;with chair alarm set;with nursing/sitter in room  OT Visit Diagnosis: Other abnormalities of gait and mobility (R26.89);Muscle weakness (generalized) (M62.81);Other symptoms and signs involving cognitive function                Time: [id number] OT Time Calculation (min): 40 min Charges:  OT General Charges $OT Visit: 1 Visit OT Evaluation $OT Eval Moderate Complexity: 1 Mod OT Treatments $Self Care/Home Management : 23-37 mins  Maurie Boettcher, OT/L   Acute OT Clinical Specialist Acute Rehabilitation Services Pager [phone] Office [phone]   [facility] [place] 02/15/2019, 2:02 PM

## 2019-02-15 NOTE — Progress Notes (Signed)
PROGRESS NOTE    Heather [name]  [id number] DOB: 1950/06/02 DOA: 02/12/2019 PCP: Richarda Osmond, DO    Brief Narrative:  68 year old female who presented with worsening dyspnea. She is homeless and lives in a shelter, her significant past medical history includes hypertension, dyslipidemia, type 2 diabetes mellitus and diabetic neuropathy. On December 14 she was taken to the [facility], ED due to 4 days of malaise and generalized weakness, she was diagnosed with pneumonia,COVID-19 test was requested and patient was discharged home with azithromycin. 4 days later she returned to the hospital due to persistent symptoms, when EMS arrived to the shelter she was hypotensive and hypoxic. On her initial physical examination her blood pressure in the emergency room was 141/77, pulse rate 84, respiratoryrate24, oxygen saturation 89%, her lungs had scattered wheezing and rales, heart S1-S2 present and rhythmic, abdomen soft, no lower extremity edema. Sodium 137, potassium 3.7, chloride 100, bicarb 24, glucose 357, BUN 49, creatinine 1.8, white count 9.7, hemoglobin 12.6, hematocrit 39.7, platelets 279. SARS COVID-19 positive.Chest radiograph had bilateral interstitial infiltrates, right upper lobe, right lower lobe and left lower lobe.90 bpm, normal axis, normal intervals, sinus rhythm with brief episodes of SVT, no ST segment or T wave changes.  Patient was admitted to the hospital with a working diagnosis of acute hypoxic respiratory failure due to SARS COVID-19 viral pneumonia  Patient responding well to medical therapy with remdesivir and corticosteroids. Has developed significant steroid induced hyperglycemia.    Assessment & Plan:   Principal Problem:   Pneumonia due to COVID-19 virus Active Problems:   HTN (hypertension)   Uncontrolled diabetes mellitus with diabetic neuropathy, with long-term current use of insulin (HCC)   Neuropathy   HLD (hyperlipidemia)   GERD  (gastroesophageal reflux disease)   Acute respiratory disease due to COVID-19 virus   AKI (acute kidney injury) ([place])   1.Acute hypoxic respiratory failure due to SARS COVID-19 viral pneumonia/Tocilizumab12/18.  RR: 16  Pulse oxymetry: 93%  Fi02: 5 L/ min per [place]   COVID-19 Labs  Recent Labs    02/13/19 0653 02/14/19 0444 02/15/19 0446  DDIMER 2.43* 1.81* 2.24*  FERRITIN 1,225* 1,384* 1,143*  CRP 12.8* 8.1* 3.9*    Lab Results  Component Value Date   SARSCOV2NAA DETECTED (A) 02/09/2019     Stable inflammatory markers.   Continue medical therapy with Remdesivir #4/5 (AST 29 -ALT 27), continue with systemic corticosteroids/ dexamethasone 6 mg q 24 H. Continue with antitussive agents, bronchodilators and airway clearing techniques with flutter valve and inceptive spirometer.   Continue out of bed to chair tid with meals. Patient may need SNF vs home health services.   2. Uncontrolled T2DM (Hgb A1c 7.6) with steroid induced hyperglycemia. Dyslipidemia.  Fasting glucose is 310. Capillary 287 and 322.   Will increase insulin glargine to 25 units and will add 5 units of meal coverage of insulin aspart, continue with insulin sliding scale for glucose cover and monitoring.   Continue with statin.   3. HTN. Continue with isosorbide, amlodipine and carvedilol.  4. AKI. Renal function with serum cr trending down to 1,51 with K at 4,5 and serum bicarbonate at 26, Continue to hold on IV fluids for now. Follow on renal panel in am.   DVT prophylaxis:enoxaparin Code Status:full Family Communication:no family at the bedside Disposition Plan/ discharge barriers:pending clinical; improvement.    Nutrition Status: Nutrition Problem: Increased nutrient needs Etiology: acute illness(COVID-19 infection) Signs/Symptoms: estimated needs Interventions: Ensure Enlive (each supplement provides 350kcal and 20  grams of protein), MVI       Subjective: Patient  continue to feel better, but not yet back to baseline, dyspnea is improving, no nausea or vomiting, no chest pain.   Objective: Vitals:   02/14/19 2137 02/15/19 0020 02/15/19 0325 02/15/19 0800  BP: 115/67 112/76 (!) 98/56 139/80  Pulse: 67 67 65 (!) 59  Resp: 20 18 18 16   Temp: 98.1 F (36.7 C) 97.6 F (36.4 C) (!) 97.5 F (36.4 C) 98.9 F (37.2 C)  TempSrc: Oral Oral Oral Oral  SpO2: 96% 94% 95% 93%  Weight:      Height:        Intake/Output Summary (Last 24 hours) at 02/15/2019 1006 Last data filed at 02/14/2019 1800 Gross per 24 hour  Intake 940 ml  Output --  Net 940 ml   Filed Weights   02/12/19 2045 02/13/19 0536  Weight: 92.1 kg 90.2 kg    Examination:   General: Not in pain or dyspnea, deconditioned  Neurology: Awake and alert, non focal  E ENT: mild pallor, no icterus, oral mucosa moist Cardiovascular: No JVD. S1-S2 present, rhythmic, no gallops, rubs, or murmurs. No lower extremity edema. Pulmonary: positive breath sounds bilaterally. Gastrointestinal. Abdomen with no organomegaly, non tender, no rebound or guarding Skin. No rashes Musculoskeletal: no joint deformities     Data Reviewed: I have personally reviewed following labs and imaging studies  CBC: Recent Labs  Lab 02/09/19 0702 02/12/19 1538 02/13/19 0653  WBC 7.1 9.7 7.6  NEUTROABS 5.2 8.5* 6.5  HGB 14.5 12.6 11.6*  HCT 44.5 39.7 36.5  MCV 85.7 85.7 86.9  PLT 255 279 [id number]   Basic Metabolic Panel: Recent Labs  Lab 02/09/19 0702 02/12/19 1538 02/13/19 0653 02/14/19 0444 02/15/19 0446  NA 139 137 140 141 138  K 4.7 3.7 4.4 4.0 4.5  CL 101 100 105 107 103  CO2 21* 24 24 24 26   GLUCOSE 245* 357* 349* 287* 310*  BUN 32* 49* 45* 57* 50*  CREATININE 2.25* 1.80* 1.60* 1.68* 1.51*  CALCIUM 8.8* 8.1* 7.9* 8.2* 8.4*  MG  --   --  2.1  --   --   PHOS  --   --  2.4*  --   --    GFR: Estimated Creatinine Clearance: 41.1 mL/min (A) (by C-G formula based on SCr of 1.51 mg/dL  (H)). Liver Function Tests: Recent Labs  Lab 02/09/19 0702 02/12/19 1538 02/13/19 0653 02/14/19 0444 02/15/19 0446  AST 33 29 25 26 29   ALT 26 26 24 23 27   ALKPHOS 68 60 57 58 59  BILITOT 0.9 0.7 0.4 0.4 0.7  PROT 8.4* 7.6 7.0 6.6 6.4*  ALBUMIN 3.7 2.8* 2.5* 2.4* 2.6*   No results for input(s): LIPASE, AMYLASE in the last 168 hours. No results for input(s): AMMONIA in the last 168 hours. Coagulation Profile: No results for input(s): INR, PROTIME in the last 168 hours. Cardiac Enzymes: No results for input(s): CKTOTAL, CKMB, CKMBINDEX, TROPONINI in the last 168 hours. BNP (last 3 results) No results for input(s): PROBNP in the last 8760 hours. HbA1C: Recent Labs    02/12/19 1538  HGBA1C 9.4*   CBG: Recent Labs  Lab 02/13/19 0746 02/13/19 1100 02/13/19 2149 02/14/19 2119 02/15/19 0735  GLUCAP 291* 311* 372* 366* 287*   Lipid Profile: Recent Labs    02/12/19 1538  TRIG 140   Thyroid Function Tests: No results for input(s): TSH, T4TOTAL, FREET4, T3FREE, THYROIDAB in the last 72 hours.  Anemia Panel: Recent Labs    02/14/19 0444 02/15/19 0446  FERRITIN 1,384* 1,143*      Radiology Studies: I have reviewed all of the imaging during this hospital visit personally     Scheduled Meds: . amLODipine  10 mg Oral Daily  . vitamin C  500 mg Oral Daily  . aspirin  81 mg Oral Daily  . atorvastatin  40 mg Oral Daily  . carvedilol  3.125 mg Oral BID WC  . chlorpheniramine-HYDROcodone  5 mL Oral Q12H  . dexamethasone (DECADRON) injection  6 mg Intravenous Q24H  . enoxaparin (LOVENOX) injection  40 mg Subcutaneous Q24H  . feeding supplement (ENSURE ENLIVE)  237 mL Oral Q24H  . feeding supplement (PRO-STAT SUGAR FREE 64)  30 mL Oral TID WC  . gabapentin  600 mg Oral QHS  . insulin aspart  0-15 Units Subcutaneous TID WC  . insulin glargine  20 Units Subcutaneous Daily  . isosorbide mononitrate  30 mg Oral Daily  . multivitamin with minerals  1 tablet Oral Daily   . zinc sulfate  220 mg Oral Daily   Continuous Infusions: . remdesivir 100 mg in NS 100 mL 100 mg (02/15/19 0936)     LOS: 3 days        Shiza Thelen Gerome Apley, MD

## 2019-02-15 NOTE — Progress Notes (Signed)
Received pt from RN pt A&Ox4, introduced to room, VSS O2Sats in the low 90s, O2 via [place], denies pain or discomfort, head to toe assessment complete, pleasant and co-op with care will cont to monitor.

## 2019-02-16 LAB — COMPREHENSIVE METABOLIC PANEL
ALT: 35 U/L (ref 0–44)
AST: 42 U/L — ABNORMAL HIGH (ref 15–41)
Albumin: 2.6 g/dL — ABNORMAL LOW (ref 3.5–5.0)
Alkaline Phosphatase: 61 U/L (ref 38–126)
Anion gap: 9 (ref 5–15)
BUN: 47 mg/dL — ABNORMAL HIGH (ref 8–23)
CO2: 25 mmol/L (ref 22–32)
Calcium: 8.7 mg/dL — ABNORMAL LOW (ref 8.9–10.3)
Chloride: 106 mmol/L (ref 98–111)
Creatinine, Ser: 1.27 mg/dL — ABNORMAL HIGH (ref 0.44–1.00)
GFR calc Af Amer: 50 mL/min — ABNORMAL LOW (ref >60)
GFR calc non Af Amer: 43 mL/min — ABNORMAL LOW (ref >60)
Glucose, Bld: 201 mg/dL — ABNORMAL HIGH (ref 70–99)
Potassium: 4.1 mmol/L (ref 3.5–5.1)
Sodium: 140 mmol/L (ref 135–145)
Total Bilirubin: 0.2 mg/dL — ABNORMAL LOW (ref 0.3–1.2)
Total Protein: 6.4 g/dL — ABNORMAL LOW (ref 6.5–8.1)

## 2019-02-16 LAB — GLUCOSE, CAPILLARY
Glucose-Capillary: 238 mg/dL — ABNORMAL HIGH (ref 70–99)
Glucose-Capillary: 168 mg/dL — ABNORMAL HIGH (ref 70–99)
Glucose-Capillary: 196 mg/dL — ABNORMAL HIGH (ref 70–99)
Glucose-Capillary: 370 mg/dL — ABNORMAL HIGH (ref 70–99)
Glucose-Capillary: 411 mg/dL — ABNORMAL HIGH (ref 70–99)

## 2019-02-16 LAB — D-DIMER, QUANTITATIVE: D-Dimer, Quant: 2.81 ug/mL-FEU — ABNORMAL HIGH (ref 0.00–0.50)

## 2019-02-16 LAB — FERRITIN: Ferritin: 865 ng/mL — ABNORMAL HIGH (ref 11–307)

## 2019-02-16 LAB — C-REACTIVE PROTEIN: CRP: 2 mg/dL — ABNORMAL HIGH (ref <1.0)

## 2019-02-16 NOTE — Progress Notes (Signed)
PROGRESS NOTE    Heather [name]  [id number] DOB: December 28, 1950 DOA: 02/12/2019 PCP: Richarda Osmond, DO    Brief Narrative:  68 year old female who presented with worsening dyspnea. She is homeless and lives in a shelter, her significant past medical history includes hypertension, dyslipidemia, type 2 diabetes mellitus and diabetic neuropathy. On December 14 she was taken to the [facility], ED due to 4 days of malaise and generalized weakness, she was diagnosed with pneumonia,COVID-19 test was requested and patient was discharged home with azithromycin. 4 days later she returned to the hospital due to persistent symptoms, when EMS arrived to the shelter she was hypotensive and hypoxic. On her initial physical examination her blood pressure in the emergency room was 141/77, pulse rate 84, respiratoryrate24, oxygen saturation 89%, her lungs had scattered wheezing and rales, heart S1-S2 present and rhythmic, abdomen soft, no lower extremity edema. Sodium 137, potassium 3.7, chloride 100, bicarb 24, glucose 357, BUN 49, creatinine 1.8, white count 9.7, hemoglobin 12.6, hematocrit 39.7, platelets 279. SARS COVID-19 positive.Chest radiograph had bilateral interstitial infiltrates, right upper lobe, right lower lobe and left lower lobe.90 bpm, normal axis, normal intervals, sinus rhythm with brief episodes of SVT, no ST segment or T wave changes.  Patient was admitted to the hospital with a working diagnosis of acute hypoxic respiratory failure due to SARS COVID-19 viral pneumonia  Patient responding well to medical therapy with remdesivir and corticosteroids. Has developed significant steroid induced hyperglycemia.    Assessment & Plan:   Principal Problem:   Pneumonia due to COVID-19 virus Active Problems:   HTN (hypertension)   Uncontrolled diabetes mellitus with diabetic neuropathy, with long-term current use of insulin (HCC)   Neuropathy   HLD (hyperlipidemia)   GERD  (gastroesophageal reflux disease)   Acute respiratory disease due to COVID-19 virus   AKI (acute kidney injury) ([place])   1.Acute hypoxic respiratory failure due to SARS COVID-19 viral pneumonia/Tocilizumab12/18.  RR: 20  Pulse oxymetry: 90 to 93%  Fi02: 5L/ min per [place]   COVID-19 Labs  Recent Labs    02/14/19 0444 02/15/19 0446 02/16/19 0228  DDIMER 1.81* 2.24* 2.81*  FERRITIN 1,384* 1,143* 865*  CRP 8.1* 3.9* 2.0*    Lab Results  Component Value Date   SARSCOV2NAA DETECTED (A) 02/09/2019    Inflammatory markers continue to be stable.   Tolerating well medical therapy withRemdesivir #5/5(AST 42 -ALT 35),onsystemic corticosteroids/ dexamethasone 6 mg q 24,antitussive agents, bronchodilators and airway clearing techniques with flutter valve and inceptive spirometer.   Patient has declined SNF, social services consulted to make arrangements patient to return to her housing program in am. She may need home 02, will order ambulatory oxymetry on room air today.   2. Uncontrolled T2DM (Hgb A1c 7.6) with steroid induced hyperglycemia. Dyslipidemia.  Her fasting glucose is 201. Capillary 168 and 196.   Continue with basal insulin glargine to 25 units and 5 units aspart for meal coverage. Continue insulin sliding scale for glucose cover and monitoring.   Continue atorvastatin  3. HTN.On isosorbide, amlodipine and carvedilol for blood pressure control.   4. AKI. Continue to improve renal function with a serum creatinine today at 1,27 with K at 4,1 and bicarbonate at 25. Patient is tolerating po well.  DVT prophylaxis:enoxaparin Code Status:full Family Communication:no family at the bedside Disposition Plan/ discharge barriers:possible dc home in am.     Nutrition Status: Nutrition Problem: Increased nutrient needs Etiology: acute illness(COVID-19 infection) Signs/Symptoms: estimated needs Interventions: Ensure Enlive (each supplement provides  350kcal and 20 grams of protein), MVI     Subjective: Patient is feeling better but not yet back to baseline, continue to use supplemental 02 per [place], no nausea or vomiting.   Objective: Vitals:   02/15/19 1836 02/15/19 2000 02/16/19 0400 02/16/19 0745  BP: 131/73 (!) 142/82 134/78 (!) 146/89  Pulse: 78 89 89 60  Resp:  19 20 20   Temp: 97.8 F (36.6 C) 97.6 F (36.4 C) 98.5 F (36.9 C) (!) 97.4 F (36.3 C)  TempSrc: Oral Oral Oral Oral  SpO2: 93% 90% 90% 93%  Weight:      Height:        Intake/Output Summary (Last 24 hours) at 02/16/2019 0855 Last data filed at 02/15/2019 1230 Gross per 24 hour  Intake 580 ml  Output --  Net 580 ml   Filed Weights   02/12/19 2045 02/13/19 0536  Weight: 92.1 kg 90.2 kg    Examination:   General: Not in pain or dyspnea, deconditioned  Neurology: Awake and alert, non focal  E ENT: mild pallor, no icterus, oral mucosa moist Cardiovascular: No JVD. S1-S2 present, rhythmic, no gallops, rubs, or murmurs. No lower extremity edema. Pulmonary: positive breath sounds bilaterally. Gastrointestinal. Abdomen with no organomegaly, non tender, no rebound or guarding Skin. No rashes Musculoskeletal: no joint deformities     Data Reviewed: I have personally reviewed following labs and imaging studies  CBC: Recent Labs  Lab 02/12/19 1538 02/13/19 0653  WBC 9.7 7.6  NEUTROABS 8.5* 6.5  HGB 12.6 11.6*  HCT 39.7 36.5  MCV 85.7 86.9  PLT 279 [id number]   Basic Metabolic Panel: Recent Labs  Lab 02/12/19 1538 02/13/19 0653 02/14/19 0444 02/15/19 0446 02/16/19 0228  NA 137 140 141 138 140  K 3.7 4.4 4.0 4.5 4.1  CL 100 105 107 103 106  CO2 24 24 24 26 25   GLUCOSE 357* 349* 287* 310* 201*  BUN 49* 45* 57* 50* 47*  CREATININE 1.80* 1.60* 1.68* 1.51* 1.27*  CALCIUM 8.1* 7.9* 8.2* 8.4* 8.7*  MG  --  2.1  --   --   --   PHOS  --  2.4*  --   --   --    GFR: Estimated Creatinine Clearance: 48.9 mL/min (A) (by C-G formula based on SCr of  1.27 mg/dL (H)). Liver Function Tests: Recent Labs  Lab 02/12/19 1538 02/13/19 0653 02/14/19 0444 02/15/19 0446 02/16/19 0228  AST 29 25 26 29  42*  ALT 26 24 23 27  35  ALKPHOS 60 57 58 59 61  BILITOT 0.7 0.4 0.4 0.7 0.2*  PROT 7.6 7.0 6.6 6.4* 6.4*  ALBUMIN 2.8* 2.5* 2.4* 2.6* 2.6*   No results for input(s): LIPASE, AMYLASE in the last 168 hours. No results for input(s): AMMONIA in the last 168 hours. Coagulation Profile: No results for input(s): INR, PROTIME in the last 168 hours. Cardiac Enzymes: No results for input(s): CKTOTAL, CKMB, CKMBINDEX, TROPONINI in the last 168 hours. BNP (last 3 results) No results for input(s): PROBNP in the last 8760 hours. HbA1C: No results for input(s): HGBA1C in the last 72 hours. CBG: Recent Labs  Lab 02/15/19 0735 02/15/19 1144 02/15/19 1614 02/15/19 2009 02/16/19 0743  GLUCAP 287* 322* 304* 287* 168*   Lipid Profile: No results for input(s): CHOL, HDL, LDLCALC, TRIG, CHOLHDL, LDLDIRECT in the last 72 hours. Thyroid Function Tests: No results for input(s): TSH, T4TOTAL, FREET4, T3FREE, THYROIDAB in the last 72 hours. Anemia Panel: Recent Labs  02/15/19 0446 02/16/19 0228  FERRITIN 1,143* 865*      Radiology Studies: I have reviewed all of the imaging during this hospital visit personally     Scheduled Meds: . amLODipine  10 mg Oral Daily  . vitamin C  500 mg Oral Daily  . aspirin  81 mg Oral Daily  . atorvastatin  40 mg Oral Daily  . carvedilol  3.125 mg Oral BID WC  . chlorpheniramine-HYDROcodone  5 mL Oral Q12H  . dexamethasone (DECADRON) injection  6 mg Intravenous Q24H  . enoxaparin (LOVENOX) injection  40 mg Subcutaneous Q24H  . feeding supplement (ENSURE ENLIVE)  237 mL Oral Q24H  . feeding supplement (PRO-STAT SUGAR FREE 64)  30 mL Oral TID WC  . gabapentin  600 mg Oral QHS  . insulin aspart  0-15 Units Subcutaneous TID WC  . insulin aspart  5 Units Subcutaneous TID WC  . insulin glargine  25 Units  Subcutaneous Daily  . isosorbide mononitrate  30 mg Oral Daily  . multivitamin with minerals  1 tablet Oral Daily  . zinc sulfate  220 mg Oral Daily   Continuous Infusions: . remdesivir 100 mg in NS 100 mL 100 mg (02/15/19 0936)     LOS: 4 days        Chanell Nadeau Gerome Apley, MD

## 2019-02-16 NOTE — Progress Notes (Addendum)
Attempted to call pt cousin Justice to provide update/POC of pt status. Justice to be primary contact per pt request. No answer at this time. Not able to leave VM w/callback number d/t "full mailbox" .

## 2019-02-16 NOTE — Progress Notes (Signed)
Spoke w/pt cousin Justice and provided update on pt status and POC. All questions/concerns addressed. Nothing further to note

## 2019-02-16 NOTE — Progress Notes (Signed)
BS checked at 2000 noted to be 411. MD on call notified -verbal order with readback to give 15 units novolog [place]. Given -will recheck in 2 hours.

## 2019-02-17 DIAGNOSIS — U071 COVID-19: Principal | ICD-10-CM

## 2019-02-17 DIAGNOSIS — E1140 Type 2 diabetes mellitus with diabetic neuropathy, unspecified: Secondary | ICD-10-CM

## 2019-02-17 DIAGNOSIS — Z794 Long term (current) use of insulin: Secondary | ICD-10-CM

## 2019-02-17 DIAGNOSIS — J9601 Acute respiratory failure with hypoxia: Secondary | ICD-10-CM

## 2019-02-17 DIAGNOSIS — E1165 Type 2 diabetes mellitus with hyperglycemia: Secondary | ICD-10-CM

## 2019-02-17 DIAGNOSIS — J1289 Other viral pneumonia: Secondary | ICD-10-CM

## 2019-02-17 LAB — CULTURE, BLOOD (ROUTINE X 2): Culture: NO GROWTH

## 2019-02-17 LAB — COMPREHENSIVE METABOLIC PANEL
ALT: 54 U/L — ABNORMAL HIGH (ref 0–44)
AST: 49 U/L — ABNORMAL HIGH (ref 15–41)
Albumin: 2.8 g/dL — ABNORMAL LOW (ref 3.5–5.0)
Alkaline Phosphatase: 77 U/L (ref 38–126)
Anion gap: 10 (ref 5–15)
BUN: 51 mg/dL — ABNORMAL HIGH (ref 8–23)
CO2: 23 mmol/L (ref 22–32)
Calcium: 9 mg/dL (ref 8.9–10.3)
Chloride: 105 mmol/L (ref 98–111)
Creatinine, Ser: 1.29 mg/dL — ABNORMAL HIGH (ref 0.44–1.00)
GFR calc Af Amer: 49 mL/min — ABNORMAL LOW (ref >60)
GFR calc non Af Amer: 43 mL/min — ABNORMAL LOW (ref >60)
Glucose, Bld: 305 mg/dL — ABNORMAL HIGH (ref 70–99)
Potassium: 4.3 mmol/L (ref 3.5–5.1)
Sodium: 138 mmol/L (ref 135–145)
Total Bilirubin: 0.4 mg/dL (ref 0.3–1.2)
Total Protein: 6.9 g/dL (ref 6.5–8.1)

## 2019-02-17 LAB — C-REACTIVE PROTEIN: CRP: 1.2 mg/dL — ABNORMAL HIGH (ref <1.0)

## 2019-02-17 LAB — GLUCOSE, CAPILLARY
Glucose-Capillary: 264 mg/dL — ABNORMAL HIGH (ref 70–99)
Glucose-Capillary: 205 mg/dL — ABNORMAL HIGH (ref 70–99)
Glucose-Capillary: 274 mg/dL — ABNORMAL HIGH (ref 70–99)
Glucose-Capillary: 356 mg/dL — ABNORMAL HIGH (ref 70–99)
Glucose-Capillary: 364 mg/dL — ABNORMAL HIGH (ref 70–99)

## 2019-02-17 LAB — D-DIMER, QUANTITATIVE: D-Dimer, Quant: 2.91 ug/mL-FEU — ABNORMAL HIGH (ref 0.00–0.50)

## 2019-02-17 LAB — FERRITIN: Ferritin: 690 ng/mL — ABNORMAL HIGH (ref 11–307)

## 2019-02-17 MED ORDER — ENSURE ENLIVE PO LIQD
237.0000 mL | Freq: Two times a day (BID) | ORAL | Status: DC
  Administered 2019-02-18 – 2019-02-21 (×6): 237 mL via ORAL

## 2019-02-17 MED ORDER — DEXAMETHASONE SODIUM PHOSPHATE 10 MG/ML IJ SOLN
4.0000 mg | INTRAMUSCULAR | Status: AC
  Administered 2019-02-18 – 2019-02-21 (×4): 4 mg via INTRAVENOUS
  Filled 2019-02-17 (×4): qty 1

## 2019-02-17 NOTE — Progress Notes (Signed)
PROGRESS NOTE    Heather [name]  [id number] DOB: 1950-04-03 DOA: 02/12/2019 PCP: Richarda Osmond, DO    Brief Narrative:  68 year old female who presented with worsening dyspnea. She is homeless and lives in a shelter, her significant past medical history includes hypertension, dyslipidemia, type 2 diabetes mellitus and diabetic neuropathy. On December 14 she was taken to the [facility], ED due to 4 days of malaise and generalized weakness, she was diagnosed with pneumonia,COVID-19 test was requested and patient was discharged home with azithromycin. 4 days later she returned to the hospital due to persistent symptoms, when EMS arrived to the shelter she was hypotensive and hypoxic.  She was hospitalized for further management.   Assessment & Plan:  Acute Hypoxic Resp. Failure/Pneumonia due to COVID-19   Recent Labs  Lab 02/12/19 1538 02/13/19 0653 02/14/19 0444 02/15/19 0446 02/16/19 0228 02/17/19 0025  DDIMER 2.22* 2.43* 1.81* 2.24* 2.81* 2.91*  FERRITIN 1,240* 1,225* 1,384* 1,143* 865* 690*  CRP 12.4* 12.8* 8.1* 3.9* 2.0* 1.2*  ALT 26 24 23 27  35 54*  PROCALCITON 0.59  --   --   --   --   --     Objective findings: Fever: Remains afebrile. Oxygen requirements: Noted to be on 3 L by nasal cannula.  Saturating in the early 90s.  COVID 19 Therapeutics: Antibacterials: None Remdesivir: Completed course Steroids: Dexamethasone 6 mg daily  Diuretics: Not on diuretics on a scheduled basis Actemra: Given on 12/18 Convalescent Plasma: Not given Vitamin C and Zinc: Continue PUD Prophylaxis: Initiate Pepcid DVT Prophylaxis:  Lovenox 40 mg daily  Patient seems to be stable from a respiratory standpoint.  She denies any shortness of breath this morning.  Remains on 3 L of oxygen.  Continue to wean down as tolerated.  Check ambulatory pulse oximetry.  May need to be discharged on oxygen.  She has completed course of remdesivir.  She remains on steroids.  She was also  given Actemra.  Continue incentive spirometry, mobilization, out of bed to chair.  Prone positioning as tolerated.  Diabetes mellitus type 2, uncontrolled with hyperglycemia HbA1c 7.6.  Elevated CBGs due to steroids.  Continue Lantus insulin.  Continue to monitor CBGs.  SSI.  Should improve as steroid is tapered down.  History of dyslipidemia Continue statin.  Essential hypertension Continue with antihypertensive medications.  Monitor blood pressures closely.  Acute kidney injury Creatinine peaked at 1.68.  Has been improving.  Monitor urine output.  DVT prophylaxis:enoxaparin Code Status:full Family Communication:no family at the bedside Disposition Plan:Patient lives in a shelter.  Unclear if they will be able to do oxygen at the shelter.  It looks like they can do home health.  I have requested social worker to contact the shelter to determine this.  Unfortunately they have not been able to contact anybody yet.      Subjective: Patient states that she feels well.  Denies any complaints.  No nausea vomiting.  Shortness of breath is improved.    Objective: Vitals:   02/16/19 1830 02/16/19 2000 02/17/19 0500 02/17/19 0700  BP:  (!) 150/77 110/73 (!) 166/78  Pulse:  72 70 67  Resp: 18 18 19 20   Temp:  97.8 F (36.6 C) 97.8 F (36.6 C) 97.6 F (36.4 C)  TempSrc:  Oral Oral Oral  SpO2: 94% 93% 92% 90%  Weight:      Height:       No intake or output data in the 24 hours ending 02/17/19 1525 Filed  Weights   02/12/19 2045 02/13/19 0536  Weight: 92.1 kg 90.2 kg    Examination:   General appearance: Awake alert.  In no distress Resp: Normal effort.  Coarse breath sound bilaterally with few crackles at the bases bilaterally.  No wheezing or rhonchi.   Cardio: S1-S2 is normal regular.  No S3-S4.  No rubs murmurs or bruit GI: Abdomen is soft.  Nontender nondistended.  Bowel sounds are present normal.  No masses organomegaly Extremities: No edema.  Full range of motion  of lower extremities. Neurologic: Alert and oriented x3.  No focal neurological deficits.     Data Reviewed: I have personally reviewed following labs and imaging studies  CBC: Recent Labs  Lab 02/12/19 1538 02/13/19 0653  WBC 9.7 7.6  NEUTROABS 8.5* 6.5  HGB 12.6 11.6*  HCT 39.7 36.5  MCV 85.7 86.9  PLT 279 Q000111Q   Basic Metabolic Panel: Recent Labs  Lab 02/13/19 0653 02/14/19 0444 02/15/19 0446 02/16/19 0228 02/17/19 0025  NA 140 141 138 140 138  K 4.4 4.0 4.5 4.1 4.3  CL 105 107 103 106 105  CO2 24 24 26 25 23   GLUCOSE 349* 287* 310* 201* 305*  BUN 45* 57* 50* 47* 51*  CREATININE 1.60* 1.68* 1.51* 1.27* 1.29*  CALCIUM 7.9* 8.2* 8.4* 8.7* 9.0  MG 2.1  --   --   --   --   PHOS 2.4*  --   --   --   --    GFR: Estimated Creatinine Clearance: 48.1 mL/min (A) (by C-G formula based on SCr of 1.29 mg/dL (H)). Liver Function Tests: Recent Labs  Lab 02/13/19 0653 02/14/19 0444 02/15/19 0446 02/16/19 0228 02/17/19 0025  AST 25 26 29  42* 49*  ALT 24 23 27  35 54*  ALKPHOS 57 58 59 61 77  BILITOT 0.4 0.4 0.7 0.2* 0.4  PROT 7.0 6.6 6.4* 6.4* 6.9  ALBUMIN 2.5* 2.4* 2.6* 2.6* 2.8*   CBG: Recent Labs  Lab 02/16/19 1637 02/16/19 2022 02/17/19 0117 02/17/19 0807 02/17/19 1209  GLUCAP 370* 411* 264* 205* 274*   Anemia Panel: Recent Labs    02/16/19 0228 02/17/19 0025  FERRITIN 865* 690*      Radiology Studies: No results found.      Scheduled Meds: . amLODipine  10 mg Oral Daily  . vitamin C  500 mg Oral Daily  . aspirin  81 mg Oral Daily  . atorvastatin  40 mg Oral Daily  . carvedilol  3.125 mg Oral BID WC  . chlorpheniramine-HYDROcodone  5 mL Oral Q12H  . dexamethasone (DECADRON) injection  6 mg Intravenous Q24H  . enoxaparin (LOVENOX) injection  40 mg Subcutaneous Q24H  . feeding supplement (ENSURE ENLIVE)  237 mL Oral Q24H  . feeding supplement (PRO-STAT SUGAR FREE 64)  30 mL Oral TID WC  . gabapentin  600 mg Oral QHS  . insulin aspart   0-15 Units Subcutaneous TID WC  . insulin aspart  5 Units Subcutaneous TID WC  . insulin glargine  25 Units Subcutaneous Daily  . isosorbide mononitrate  30 mg Oral Daily  . multivitamin with minerals  1 tablet Oral Daily  . zinc sulfate  220 mg Oral Daily   Continuous Infusions:    LOS: 5 days    Bonnielee Haff, MD

## 2019-02-17 NOTE — Progress Notes (Signed)
CSW attempted to contact patients housing director X2 with no response- CSW is unsure if patient is able to return to transitional housing program at this time.   CSW sent referrals to the following [facility] agencies and patient was declined: [facility], [facility], [facility], [facility], [facility].   Kingsley Spittle, LCSW [facility] [place]  [phone]

## 2019-02-17 NOTE — Care Management Important Message (Signed)
Important Message  Patient Details  Name: Heather [name] MRN: [id number] Date of Birth: 12/07/1950   Medicare Important Message Given:  Yes - Important Message mailed due to current National Emergency  Verbal consent obtained due to current National Emergency  Relationship to patient: Other [facility] (must comment) Contact Name: Tomi Bamberger Call Date: 02/17/19  Time: 1448 Phone: [phone] Outcome: No Answer/Busy Important Message mailed to: Patient address on file    Delorse Lek 02/17/2019, 2:48 PM

## 2019-02-17 NOTE — Progress Notes (Signed)
Inpatient Diabetes Program Recommendations  AACE/ADA: New Consensus Statement on Inpatient Glycemic Control (2015)  Target Ranges:  Prepandial:   less than 140 mg/dL      Peak postprandial:   less than 180 mg/dL (1-2 hours)      Critically ill patients:  140 - 180 mg/dL   Lab Results  Component Value Date   GLUCAP 264 (H) 02/17/2019   HGBA1C 9.4 (H) 02/12/2019    Review of Glycemic Control  Diabetes history: DM 2 Outpatient Diabetes medications: Tresiba 32 units, Victoza 1.8 mg Daily, Metformin 1000 mg bid Current orders for Inpatient glycemic control:  Lantus 25 units Daily Novolog 0-15 units tid Novolog 5 units tid meal coverage  A1c 9.4% on 12/18 Decadron 6 mg Q24 hours Supplements: Ensure Enlive Q 24 hours BUN/Creat: 51/1.29  Inpatient Diabetes Program Recommendations:    -  Consider increasing meal coverage to Novolog 8 units tid meal coverage if eating >50% of meals.  Thanks,  Tama Headings RN, MSN, BC-ADM Inpatient Diabetes Coordinator Team Pager [phone] (8a-5p)

## 2019-02-17 NOTE — Progress Notes (Signed)
Nutrition Follow-up RD working remotely.  DOCUMENTATION CODES:   Obesity unspecified  INTERVENTION:   Increase Ensure Enlive to BID, each supplement provides 350 kcal and 20 grams of protein.  Continue Pro-stat 30 ml TID, each supplement provides 100 kcal and 15 gm protein.  Continue MVI with minerals daily.  Encourage PO's.  Pt receiving Hormel Shake daily with Breakfast which provides 520 kcals and 22 g of protein and Magic cup BID with lunch and dinner, each supplement provides 290 kcal and 9 grams of protein, automatically on meal trays to optimize nutritional intake.   NUTRITION DIAGNOSIS:   Increased nutrient needs related to acute illness(COVID-19 infection) as evidenced by estimated needs.  Ongoing   GOAL:   Patient will meet greater than or equal to 90% of their needs  Progressing  MONITOR:   PO intake, Supplement acceptance, Labs, Weight trends, I & O's  ASSESSMENT:   68 year old female who presented with worsening dyspnea.  She is homeless and lives in a shelter, her significant past medical history includes hypertension, dyslipidemia, type 2 diabetes mellitus and diabetic neuropathy.  On December 14 she was taken to the [facility], ED due to 4 days of malaise and generalized weakness, she was diagnosed with pneumonia, COVID-19 test was requested and patient was discharged home with azithromycin.  4 days later she returned to the hospital due to persistent symptoms, when EMS arrived to the shelter she was hypotensive and hypoxic.  Meal intakes seem to be increasing. Patient is now consuming 25-75% of meals. She is also drinking Ensure Enlive once daily and taking Pro-stat 30 ml TID.   Labs reviewed.  CBG's: [phone]  Medications reviewed and include vitamin C, novolog, lantus, MVI with minerals, zinc sulfate.  No new weight available since 12/19.  Diet Order:   Diet Order            Diet Carb Modified Fluid consistency: Thin; Room service  appropriate? Yes  Diet effective now              EDUCATION NEEDS:   No education needs have been identified at this time  Skin:  Skin Assessment: Reviewed RN Assessment  Last BM:  12/21  Height:   Ht Readings from Last 1 Encounters:  02/13/19 5\' 7"  (1.702 m)    Weight:   Wt Readings from Last 1 Encounters:  02/13/19 90.2 kg    Ideal Body Weight:  61.3 kg  BMI:  Body mass index is 31.15 kg/m.  Estimated Nutritional Needs:   Kcal:  1800-2000  Protein:  85-100 gm  Fluid:  1.9L/day   Molli Barrows, RD, LDN, [place] Pager [phone] After Hours Pager [phone]

## 2019-02-18 LAB — GLUCOSE, CAPILLARY
Glucose-Capillary: 202 mg/dL — ABNORMAL HIGH (ref 70–99)
Glucose-Capillary: 199 mg/dL — ABNORMAL HIGH (ref 70–99)
Glucose-Capillary: 264 mg/dL — ABNORMAL HIGH (ref 70–99)

## 2019-02-18 MED ORDER — DEXAMETHASONE 2 MG PO TABS: ORAL_TABLET | ORAL | 0 refills | Status: DC

## 2019-02-18 NOTE — Progress Notes (Signed)
Patient ambulating on room air saturating 86%. Patient ambulating on 3L saturating 90%.

## 2019-02-18 NOTE — Progress Notes (Signed)
PROGRESS NOTE    Heather [name]  [id number] DOB: [birth year] DOA: 02/12/2019 PCP: Richarda Osmond, DO    Brief Narrative:  68 year old female who presented with worsening dyspnea. She is homeless and lives in a shelter, her significant past medical history includes hypertension, dyslipidemia, type 2 diabetes mellitus and diabetic neuropathy. On December 14 she was taken to the [facility], ED due to 4 days of malaise and generalized weakness, she was diagnosed with pneumonia,COVID-19 test was requested and patient was discharged home with azithromycin. 4 days later she returned to the hospital due to persistent symptoms, when EMS arrived to the shelter she was hypotensive and hypoxic.  She was hospitalized for further management.   Assessment & Plan:  Acute Hypoxic Resp. Failure/Pneumonia due to COVID-19   Recent Labs  Lab 02/12/19 1538 02/13/19 0653 02/14/19 0444 02/15/19 0446 02/16/19 0228 02/17/19 0025  DDIMER 2.22* 2.43* 1.81* 2.24* 2.81* 2.91*  FERRITIN 1,240* 1,225* 1,384* 1,143* 865* 690*  CRP 12.4* 12.8* 8.1* 3.9* 2.0* 1.2*  ALT 26 24 23 27  35 54*  PROCALCITON 0.59  --   --   --   --   --     Objective findings: Fever: Remains afebrile Oxygen requirements: Not requiring any oxygen while at rest.  Does tend to desaturate when she ambulates.  COVID 19 Therapeutics: Antibacterials: None Remdesivir: Completed course Steroids: Dexamethasone 6 mg daily  Diuretics: Not on diuretics on a scheduled basis Actemra: Given on 12/18 Convalescent Plasma: Not given Vitamin C and Zinc: Continue PUD Prophylaxis: Initiate Pepcid DVT Prophylaxis:  Lovenox 40 mg daily  Patient is stable from a respiratory standpoint.  She feels well.  Ambulatory pulse oximetry was checked and the patient did desaturate into the mid 80s with ambulation.  She will need home oxygen.  She has completed course of remdesivir.  She remains on steroids.  She was given Actemra as well.  Continue with  incentive spirometry mobilization out of bed to chair.    Diabetes mellitus type 2, uncontrolled with hyperglycemia HbA1c 7.6.  Elevated CBGs due to steroids.  Continue Lantus insulin.  Continue SSI.  CBG should improve as steroid is tapered down.    History of dyslipidemia Continue statin.  Essential hypertension Continue with antihypertensive medications.  Monitor blood pressures closely.  Acute kidney injury Creatinine peaked at 1.68.  Has been improving.  Monitor urine output.  DVT prophylaxis:enoxaparin Code Status:full Family Communication:no family at the bedside Disposition Plan:Patient lives in a shelter.  Last note from social worker mentions that they have been unable to reach the Health visitor.  Patient will need home oxygen which has been ordered as well.     Subjective: Patient states that she feels well.  Denies any shortness of breath.  No complaints offered.  Objective: Vitals:   02/17/19 1630 02/17/19 2000 02/18/19 0356 02/18/19 0745  BP: (!) 160/87 (!) 148/85 (!) 152/77 (!) 150/81  Pulse: 95 84 69 74  Resp: 18 18 18    Temp:  97.7 F (36.5 C) 97.7 F (36.5 C) 97.6 F (36.4 C)  TempSrc:  Oral Oral Oral  SpO2:   94% 93%  Weight:      Height:       No intake or output data in the 24 hours ending 02/18/19 1309 Filed Weights   02/12/19 2045 02/13/19 0536  Weight: 92.1 kg 90.2 kg    Examination:   General appearance: Awake alert.  In no distress Resp: Normal effort.  Coarse breath sounds bilaterally.  No wheezing rales or rhonchi.   Cardio: S1-S2 is normal regular.  No S3-S4.  No rubs murmurs or bruit GI: Abdomen is soft.  Nontender nondistended.  Bowel sounds are present normal.  No masses organomegaly Extremities: No edema.  Full range of motion of lower extremities. Neurologic: No focal neurological deficits.      Data Reviewed: I have personally reviewed following labs and imaging studies  CBC: Recent Labs  Lab 02/12/19 1538  02/13/19 0653  WBC 9.7 7.6  NEUTROABS 8.5* 6.5  HGB 12.6 11.6*  HCT 39.7 36.5  MCV 85.7 86.9  PLT 279 Q000111Q   Basic Metabolic Panel: Recent Labs  Lab 02/13/19 0653 02/14/19 0444 02/15/19 0446 02/16/19 0228 02/17/19 0025  NA 140 141 138 140 138  K 4.4 4.0 4.5 4.1 4.3  CL 105 107 103 106 105  CO2 24 24 26 25 23   GLUCOSE 349* 287* 310* 201* 305*  BUN 45* 57* 50* 47* 51*  CREATININE 1.60* 1.68* 1.51* 1.27* 1.29*  CALCIUM 7.9* 8.2* 8.4* 8.7* 9.0  MG 2.1  --   --   --   --   PHOS 2.4*  --   --   --   --    GFR: Estimated Creatinine Clearance: 48.1 mL/min (A) (by C-G formula based on SCr of 1.29 mg/dL (H)). Liver Function Tests: Recent Labs  Lab 02/13/19 0653 02/14/19 0444 02/15/19 0446 02/16/19 0228 02/17/19 0025  AST 25 26 29  42* 49*  ALT 24 23 27  35 54*  ALKPHOS 57 58 59 61 77  BILITOT 0.4 0.4 0.7 0.2* 0.4  PROT 7.0 6.6 6.4* 6.4* 6.9  ALBUMIN 2.5* 2.4* 2.6* 2.6* 2.8*   CBG: Recent Labs  Lab 02/17/19 1209 02/17/19 1612 02/17/19 2020 02/18/19 0738 02/18/19 1135  GLUCAP 274* 356* 364* 202* 199*   Anemia Panel: Recent Labs    02/16/19 0228 02/17/19 0025  FERRITIN 865* 43*      Radiology Studies: No results found.      Scheduled Meds: . amLODipine  10 mg Oral Daily  . vitamin C  500 mg Oral Daily  . aspirin  81 mg Oral Daily  . atorvastatin  40 mg Oral Daily  . carvedilol  3.125 mg Oral BID WC  . chlorpheniramine-HYDROcodone  5 mL Oral Q12H  . dexamethasone (DECADRON) injection  4 mg Intravenous Q24H  . enoxaparin (LOVENOX) injection  40 mg Subcutaneous Q24H  . feeding supplement (ENSURE ENLIVE)  237 mL Oral BID BM  . feeding supplement (PRO-STAT SUGAR FREE 64)  30 mL Oral TID WC  . gabapentin  600 mg Oral QHS  . insulin aspart  0-15 Units Subcutaneous TID WC  . insulin aspart  5 Units Subcutaneous TID WC  . insulin glargine  25 Units Subcutaneous Daily  . isosorbide mononitrate  30 mg Oral Daily  . multivitamin with minerals  1 tablet  Oral Daily  . zinc sulfate  220 mg Oral Daily   Continuous Infusions:    LOS: 6 days    Bonnielee Haff, MD

## 2019-02-18 NOTE — Plan of Care (Signed)
  Problem: Education: Goal: Knowledge of risk factors and measures for prevention of condition will improve Outcome: Progressing   Problem: Coping: Goal: Psychosocial and spiritual needs will be supported Outcome: Progressing   Problem: Respiratory: Goal: Will maintain a patent airway Outcome: Progressing Goal: Complications related to the disease process, condition or treatment will be avoided or minimized Outcome: Progressing   Problem: Education: Goal: Ability to describe self-care measures that may prevent or decrease complications (Diabetes Survival Skills Education) will improve Outcome: Progressing Goal: Individualized Educational Video(s) Outcome: Progressing   Problem: Coping: Goal: Ability to adjust to condition or change in health will improve Outcome: Progressing   Problem: Fluid Volume: Goal: Ability to maintain a balanced intake and output will improve Outcome: Progressing   Problem: Health Behavior/Discharge Planning: Goal: Ability to identify and utilize available resources and services will improve Outcome: Progressing Goal: Ability to manage health-related needs will improve Outcome: Progressing   Problem: Metabolic: Goal: Ability to maintain appropriate glucose levels will improve Outcome: Progressing   Problem: Nutritional: Goal: Maintenance of adequate nutrition will improve Outcome: Progressing Goal: Progress toward achieving an optimal weight will improve Outcome: Progressing   Problem: Skin Integrity: Goal: Risk for impaired skin integrity will decrease Outcome: Progressing   Problem: Tissue Perfusion: Goal: Adequacy of tissue perfusion will improve Outcome: Progressing   

## 2019-02-19 LAB — GLUCOSE, CAPILLARY
Glucose-Capillary: 188 mg/dL — ABNORMAL HIGH (ref 70–99)
Glucose-Capillary: 196 mg/dL — ABNORMAL HIGH (ref 70–99)
Glucose-Capillary: 368 mg/dL — ABNORMAL HIGH (ref 70–99)

## 2019-02-19 MED ORDER — FAMOTIDINE 20 MG PO TABS
20.0000 mg | ORAL_TABLET | Freq: Every day | ORAL | Status: DC
  Administered 2019-02-19 – 2019-02-23 (×4): 20 mg via ORAL
  Filled 2019-02-19 (×3): qty 1

## 2019-02-19 NOTE — Plan of Care (Signed)
No acute events during this shift. Pt on 2LNC. No c/o SOB at this time. Pt is ambulatory. VSS and WDL for this pt. Continue to monitor  Problem: Education: Goal: Knowledge of risk factors and measures for prevention of condition will improve Outcome: Progressing   Problem: Coping: Goal: Psychosocial and spiritual needs will be supported Outcome: Progressing   Problem: Respiratory: Goal: Will maintain a patent airway Outcome: Progressing Goal: Complications related to the disease process, condition or treatment will be avoided or minimized Outcome: Progressing   Problem: Education: Goal: Ability to describe self-care measures that may prevent or decrease complications (Diabetes Survival Skills Education) will improve Outcome: Progressing Goal: Individualized Educational Video(s) Outcome: Progressing   Problem: Coping: Goal: Ability to adjust to condition or change in health will improve Outcome: Progressing   Problem: Fluid Volume: Goal: Ability to maintain a balanced intake and output will improve Outcome: Progressing   Problem: Health Behavior/Discharge Planning: Goal: Ability to identify and utilize available resources and services will improve Outcome: Progressing Goal: Ability to manage health-related needs will improve Outcome: Progressing   Problem: Metabolic: Goal: Ability to maintain appropriate glucose levels will improve Outcome: Progressing   Problem: Nutritional: Goal: Maintenance of adequate nutrition will improve Outcome: Progressing Goal: Progress toward achieving an optimal weight will improve Outcome: Progressing   Problem: Skin Integrity: Goal: Risk for impaired skin integrity will decrease Outcome: Progressing   Problem: Tissue Perfusion: Goal: Adequacy of tissue perfusion will improve Outcome: Progressing

## 2019-02-19 NOTE — Plan of Care (Signed)
  Problem: Education: Goal: Knowledge of risk factors and measures for prevention of condition will improve Outcome: Progressing   Problem: Coping: Goal: Psychosocial and spiritual needs will be supported Outcome: Progressing   Problem: Respiratory: Goal: Will maintain a patent airway Outcome: Progressing Goal: Complications related to the disease process, condition or treatment will be avoided or minimized Outcome: Progressing   Problem: Education: Goal: Ability to describe self-care measures that may prevent or decrease complications (Diabetes Survival Skills Education) will improve Outcome: Progressing Goal: Individualized Educational Video(s) Outcome: Progressing   Problem: Coping: Goal: Ability to adjust to condition or change in health will improve Outcome: Progressing   Problem: Fluid Volume: Goal: Ability to maintain a balanced intake and output will improve Outcome: Progressing   Problem: Health Behavior/Discharge Planning: Goal: Ability to identify and utilize available resources and services will improve Outcome: Progressing Goal: Ability to manage health-related needs will improve Outcome: Progressing   Problem: Metabolic: Goal: Ability to maintain appropriate glucose levels will improve Outcome: Progressing   Problem: Nutritional: Goal: Maintenance of adequate nutrition will improve Outcome: Progressing Goal: Progress toward achieving an optimal weight will improve Outcome: Progressing   Problem: Skin Integrity: Goal: Risk for impaired skin integrity will decrease Outcome: Progressing   Problem: Tissue Perfusion: Goal: Adequacy of tissue perfusion will improve Outcome: Progressing   

## 2019-02-19 NOTE — Progress Notes (Signed)
PROGRESS NOTE    Heather [name]  [id number] DOB: Aug 12, 1950 DOA: 02/12/2019 PCP: Richarda Osmond, DO    Brief Narrative:  68 year old female who presented with worsening dyspnea. She is homeless and lives in a shelter, her significant past medical history includes hypertension, dyslipidemia, type 2 diabetes mellitus and diabetic neuropathy. On December 14 she was taken to the [facility], ED due to 4 days of malaise and generalized weakness, she was diagnosed with pneumonia,COVID-19 test was requested and patient was discharged home with azithromycin. 4 days later she returned to the hospital due to persistent symptoms, when EMS arrived to the shelter she was hypotensive and hypoxic.  She was hospitalized for further management.   Assessment & Plan:  Acute Hypoxic Resp. Failure/Pneumonia due to COVID-19 From a COVID-19 standpoint patient has improved.  She is now saturating normal on room air.  She is afebrile.  He has trended down to 1.2.  Elevated D-dimer is due to COVID-19.  She has completed her course of remdesivir.  Steroids being tapered down.  She was given Actemra.  Not given convalescent plasma.  She however tends to desaturate on ambulation so she will need home oxygen.  She will also need pulmonology consultation in the outpatient setting.  Continue with incentive spirometry mobilization.  Discharge when she has safe disposition.  Recent Labs  Lab 02/12/19 1538 02/13/19 0653 02/14/19 0444 02/15/19 0446 02/16/19 0228 02/17/19 0025  DDIMER 2.22* 2.43* 1.81* 2.24* 2.81* 2.91*  FERRITIN 1,240* 1,225* 1,384* 1,143* 865* 690*  CRP 12.4* 12.8* 8.1* 3.9* 2.0* 1.2*  ALT 26 24 23 27  35 54*  PROCALCITON 0.59  --   --   --   --   --     Diabetes mellitus type 2, uncontrolled with hyperglycemia HbA1c 7.6.  Elevated CBGs due to steroids.  Remains on Lantus.  CBG should improve as steroid is tapered down.   History of dyslipidemia Continue statin.  Essential  hypertension Blood pressure is reasonably well controlled.  Occasional high readings noted.  Continue current medications.    Acute kidney injury Creatinine peaked at 1.68.  Improved subsequently.  DVT prophylaxis:enoxaparin Code Status:full Family Communication:no family at the bedside Disposition Plan:Patient lives in a shelter.  Last note from social worker mentions that they have been unable to reach the Health visitor.  Patient will need home oxygen which has been ordered as well.     Subjective: Denies any shortness of breath.  No nausea vomiting.  Objective: Vitals:   02/18/19 1600 02/18/19 2000 02/19/19 0400 02/19/19 0746  BP: (!) 141/73 (!) 141/77 (!) 142/78 (!) 157/82  Pulse: 82 67 69 60  Resp:  16 16   Temp: 97.6 F (36.4 C) 97.8 F (36.6 C) 97.9 F (36.6 C) 97.8 F (36.6 C)  TempSrc: Oral Oral Oral Oral  SpO2: 90% 90% 92% 95%  Weight:      Height:        Intake/Output Summary (Last 24 hours) at 02/19/2019 1316 Last data filed at 02/18/2019 2000 Gross per 24 hour  Intake 240 ml  Output --  Net 240 ml   Filed Weights   02/12/19 2045 02/13/19 0536  Weight: 92.1 kg 90.2 kg    Examination:   General appearance: Awake alert.  In no distress Resp: Normal effort.  Clear to auscultation bilaterally.  No wheezing rales or rhonchi.   Cardio: S1-S2 is normal regular.  No S3-S4.  No rubs murmurs or bruit GI: Abdomen is soft.  Nontender  nondistended.  Bowel sounds are present normal.  No masses organomegaly Extremities: No edema.  Full range of motion of lower extremities. Neurologic: Alert and oriented x3.  No focal neurological deficits.     Data Reviewed: I have personally reviewed following labs and imaging studies  CBC: Recent Labs  Lab 02/12/19 1538 02/13/19 0653  WBC 9.7 7.6  NEUTROABS 8.5* 6.5  HGB 12.6 11.6*  HCT 39.7 36.5  MCV 85.7 86.9  PLT 279 [id number]   Basic Metabolic Panel: Recent Labs  Lab 02/13/19 0653 02/14/19 0444  02/15/19 0446 02/16/19 0228 02/17/19 0025  NA 140 141 138 140 138  K 4.4 4.0 4.5 4.1 4.3  CL 105 107 103 106 105  CO2 24 24 26 25 23   GLUCOSE 349* 287* 310* 201* 305*  BUN 45* 57* 50* 47* 51*  CREATININE 1.60* 1.68* 1.51* 1.27* 1.29*  CALCIUM 7.9* 8.2* 8.4* 8.7* 9.0  MG 2.1  --   --   --   --   PHOS 2.4*  --   --   --   --    GFR: Estimated Creatinine Clearance: 48.1 mL/min (A) (by C-G formula based on SCr of 1.29 mg/dL (H)). Liver Function Tests: Recent Labs  Lab 02/13/19 0653 02/14/19 0444 02/15/19 0446 02/16/19 0228 02/17/19 0025  AST 25 26 29  42* 49*  ALT 24 23 27  35 54*  ALKPHOS 57 58 59 61 77  BILITOT 0.4 0.4 0.7 0.2* 0.4  PROT 7.0 6.6 6.4* 6.4* 6.9  ALBUMIN 2.5* 2.4* 2.6* 2.6* 2.8*   CBG: Recent Labs  Lab 02/18/19 0738 02/18/19 1135 02/18/19 1625 02/19/19 0736 02/19/19 1125  GLUCAP 202* 199* 264* 188* 196*   Anemia Panel: Recent Labs    02/17/19 0025  FERRITIN 73*      Radiology Studies: No results found.      Scheduled Meds: . amLODipine  10 mg Oral Daily  . vitamin C  500 mg Oral Daily  . aspirin  81 mg Oral Daily  . atorvastatin  40 mg Oral Daily  . carvedilol  3.125 mg Oral BID WC  . chlorpheniramine-HYDROcodone  5 mL Oral Q12H  . dexamethasone (DECADRON) injection  4 mg Intravenous Q24H  . enoxaparin (LOVENOX) injection  40 mg Subcutaneous Q24H  . feeding supplement (ENSURE ENLIVE)  237 mL Oral BID BM  . feeding supplement (PRO-STAT SUGAR FREE 64)  30 mL Oral TID WC  . gabapentin  600 mg Oral QHS  . insulin aspart  0-15 Units Subcutaneous TID WC  . insulin aspart  5 Units Subcutaneous TID WC  . insulin glargine  25 Units Subcutaneous Daily  . isosorbide mononitrate  30 mg Oral Daily  . multivitamin with minerals  1 tablet Oral Daily  . zinc sulfate  220 mg Oral Daily   Continuous Infusions:    LOS: 7 days    Bonnielee Haff, MD

## 2019-02-20 LAB — GLUCOSE, CAPILLARY
Glucose-Capillary: 141 mg/dL — ABNORMAL HIGH (ref 70–99)
Glucose-Capillary: 309 mg/dL — ABNORMAL HIGH (ref 70–99)
Glucose-Capillary: 355 mg/dL — ABNORMAL HIGH (ref 70–99)
Glucose-Capillary: 336 mg/dL — ABNORMAL HIGH (ref 70–99)

## 2019-02-20 MED ORDER — POLYETHYLENE GLYCOL 3350 17 G PO PACK
17.0000 g | PACK | Freq: Two times a day (BID) | ORAL | Status: DC
  Administered 2019-02-20 – 2019-02-22 (×5): 17 g via ORAL
  Filled 2019-02-20 (×5): qty 1

## 2019-02-20 MED ORDER — SENNOSIDES-DOCUSATE SODIUM 8.6-50 MG PO TABS
2.0000 | ORAL_TABLET | Freq: Two times a day (BID) | ORAL | Status: DC
  Administered 2019-02-20 – 2019-02-22 (×6): 2 via ORAL
  Filled 2019-02-20 (×7): qty 2

## 2019-02-20 NOTE — Progress Notes (Signed)
PROGRESS NOTE    Heather [name]  [id number] DOB: 18-Jul-1950 DOA: 02/12/2019 PCP: Richarda Osmond, DO    Brief Narrative:  68 year old female who presented with worsening dyspnea. She is homeless and lives in a shelter, her significant past medical history includes hypertension, dyslipidemia, type 2 diabetes mellitus and diabetic neuropathy. On December 14 she was taken to the [facility], ED due to 4 days of malaise and generalized weakness, she was diagnosed with pneumonia,COVID-19 test was requested and patient was discharged home with azithromycin. 4 days later she returned to the hospital due to persistent symptoms, when EMS arrived to the shelter she was hypotensive and hypoxic.  She was hospitalized for further management.   Assessment & Plan:  Acute Hypoxic Resp. Failure/Pneumonia due to COVID-19 From a COVID-19 perspective patient has improved.  She is not requiring any oxygen at rest.  She does tend to desaturate with exertion.  She will need home oxygen.  Inflammatory markers had improved.  She has completed course of remdesivir.  Steroid is being tapered down.  She was also given Actemra.  Outpatient pulmonology consultation.  Discharge when she has safe disposition.    Diabetes mellitus type 2, uncontrolled with hyperglycemia HbA1c 7.6.  Elevated CBGs due to steroids.  Remains on Lantus.  CBG should improve as steroid is tapered down.   History of dyslipidemia Continue statin.  Essential hypertension Blood pressure is reasonably well controlled.  Occasional high readings noted.  Continue current medications.    Acute kidney injury Creatinine peaked at 1.68.  Improved subsequently.  DVT prophylaxis:enoxaparin Code Status:full Family Communication:no family at the bedside Disposition Plan:Patient lives in a shelter.  Last note from social worker mentions that they have been unable to reach the Health visitor.  Patient will need home oxygen which has been  ordered as well.     Subjective: Denies any complaints.  Shortness of breath is improved.  She feels well.  Objective: Vitals:   02/19/19 1604 02/19/19 2000 02/20/19 0400 02/20/19 0743  BP: (!) 156/88 (!) 146/76 (!) 150/82 (!) 160/79  Pulse: 92 75 63 65  Resp:  17 18   Temp: 97.9 F (36.6 C) 98.5 F (36.9 C) 97.7 F (36.5 C) 98 F (36.7 C)  TempSrc: Oral Oral Oral Oral  SpO2: 97% 98% 96% 96%  Weight:      Height:        Intake/Output Summary (Last 24 hours) at 02/20/2019 1255 Last data filed at 02/19/2019 2000 Gross per 24 hour  Intake 240 ml  Output --  Net 240 ml   Filed Weights   02/12/19 2045 02/13/19 0536  Weight: 92.1 kg 90.2 kg    Examination:   General appearance: Awake alert.  In no distress Resp: Clear to auscultation bilaterally.  Normal effort Cardio: S1-S2 is normal regular.  No S3-S4.  No rubs murmurs or bruit GI: Abdomen is soft.  Nontender nondistended.  Bowel sounds are present normal.  No masses organomegaly Extremities: No edema.  Full range of motion of lower extremities. Neurologic: Alert and oriented x3.  No focal neurological deficits.    Data Reviewed: I have personally reviewed following labs and imaging studies  CBC: No results for input(s): WBC, NEUTROABS, HGB, HCT, MCV, PLT in the last 168 hours. Basic Metabolic Panel: Recent Labs  Lab 02/14/19 0444 02/15/19 0446 02/16/19 0228 02/17/19 0025  NA 141 138 140 138  K 4.0 4.5 4.1 4.3  CL 107 103 106 105  CO2 24 26 25  23  GLUCOSE 287* 310* 201* 305*  BUN 57* 50* 47* 51*  CREATININE 1.68* 1.51* 1.27* 1.29*  CALCIUM 8.2* 8.4* 8.7* 9.0   GFR: Estimated Creatinine Clearance: 48.1 mL/min (A) (by C-G formula based on SCr of 1.29 mg/dL (H)). Liver Function Tests: Recent Labs  Lab 02/14/19 0444 02/15/19 0446 02/16/19 0228 02/17/19 0025  AST 26 29 42* 49*  ALT 23 27 35 54*  ALKPHOS 58 59 61 77  BILITOT 0.4 0.7 0.2* 0.4  PROT 6.6 6.4* 6.4* 6.9  ALBUMIN 2.4* 2.6* 2.6* 2.8*    CBG: Recent Labs  Lab 02/19/19 0736 02/19/19 1125 02/19/19 2010 02/20/19 0747 02/20/19 1145  GLUCAP 188* 196* 368* 141* 309*   Anemia Panel: No results for input(s): VITAMINB12, FOLATE, FERRITIN, TIBC, IRON, RETICCTPCT in the last 72 hours.    Radiology Studies: No results found.      Scheduled Meds: . amLODipine  10 mg Oral Daily  . vitamin C  500 mg Oral Daily  . aspirin  81 mg Oral Daily  . atorvastatin  40 mg Oral Daily  . carvedilol  3.125 mg Oral BID WC  . chlorpheniramine-HYDROcodone  5 mL Oral Q12H  . dexamethasone (DECADRON) injection  4 mg Intravenous Q24H  . enoxaparin (LOVENOX) injection  40 mg Subcutaneous Q24H  . famotidine  20 mg Oral Daily  . feeding supplement (ENSURE ENLIVE)  237 mL Oral BID BM  . feeding supplement (PRO-STAT SUGAR FREE 64)  30 mL Oral TID WC  . gabapentin  600 mg Oral QHS  . insulin aspart  0-15 Units Subcutaneous TID WC  . insulin aspart  5 Units Subcutaneous TID WC  . insulin glargine  25 Units Subcutaneous Daily  . isosorbide mononitrate  30 mg Oral Daily  . multivitamin with minerals  1 tablet Oral Daily  . polyethylene glycol  17 g Oral BID  . senna-docusate  2 tablet Oral BID  . zinc sulfate  220 mg Oral Daily   Continuous Infusions:    LOS: 8 days    Bonnielee Haff, MD

## 2019-02-20 NOTE — Plan of Care (Signed)
  Problem: Education: Goal: Knowledge of risk factors and measures for prevention of condition will improve Outcome: Progressing   Problem: Coping: Goal: Psychosocial and spiritual needs will be supported Outcome: Progressing   Problem: Respiratory: Goal: Will maintain a patent airway Outcome: Progressing Goal: Complications related to the disease process, condition or treatment will be avoided or minimized Outcome: Progressing   Problem: Education: Goal: Ability to describe self-care measures that may prevent or decrease complications (Diabetes Survival Skills Education) will improve Outcome: Progressing Goal: Individualized Educational Video(s) Outcome: Progressing   Problem: Coping: Goal: Ability to adjust to condition or change in health will improve Outcome: Progressing   Problem: Fluid Volume: Goal: Ability to maintain a balanced intake and output will improve Outcome: Progressing   Problem: Health Behavior/Discharge Planning: Goal: Ability to identify and utilize available resources and services will improve Outcome: Progressing Goal: Ability to manage health-related needs will improve Outcome: Progressing   Problem: Metabolic: Goal: Ability to maintain appropriate glucose levels will improve Outcome: Progressing   Problem: Nutritional: Goal: Maintenance of adequate nutrition will improve Outcome: Progressing Goal: Progress toward achieving an optimal weight will improve Outcome: Progressing   Problem: Skin Integrity: Goal: Risk for impaired skin integrity will decrease Outcome: Progressing   Problem: Tissue Perfusion: Goal: Adequacy of tissue perfusion will improve Outcome: Progressing   

## 2019-02-21 LAB — GLUCOSE, CAPILLARY
Glucose-Capillary: 198 mg/dL — ABNORMAL HIGH (ref 70–99)
Glucose-Capillary: 188 mg/dL — ABNORMAL HIGH (ref 70–99)
Glucose-Capillary: 322 mg/dL — ABNORMAL HIGH (ref 70–99)
Glucose-Capillary: 284 mg/dL — ABNORMAL HIGH (ref 70–99)

## 2019-02-21 MED ORDER — GLUCERNA SHAKE PO LIQD
237.0000 mL | Freq: Two times a day (BID) | ORAL | Status: DC
  Administered 2019-02-22 – 2019-02-23 (×2): 237 mL via ORAL
  Filled 2019-02-21 (×3): qty 237

## 2019-02-21 NOTE — Progress Notes (Signed)
Patient O2 sat at rest room air: 88%. Patient ambulating room air O2 sat 85% with SOB.  Patient ambulating with 3L [place] O2 at 90%.

## 2019-02-21 NOTE — Plan of Care (Signed)
  Problem: Education: Goal: Knowledge of risk factors and measures for prevention of condition will improve Outcome: Progressing   Problem: Coping: Goal: Psychosocial and spiritual needs will be supported Outcome: Progressing   Problem: Respiratory: Goal: Will maintain a patent airway Outcome: Progressing Goal: Complications related to the disease process, condition or treatment will be avoided or minimized Outcome: Progressing   Problem: Education: Goal: Ability to describe self-care measures that may prevent or decrease complications (Diabetes Survival Skills Education) will improve Outcome: Progressing Goal: Individualized Educational Video(s) Outcome: Progressing   Problem: Coping: Goal: Ability to adjust to condition or change in health will improve Outcome: Progressing   Problem: Fluid Volume: Goal: Ability to maintain a balanced intake and output will improve Outcome: Progressing   Problem: Health Behavior/Discharge Planning: Goal: Ability to identify and utilize available resources and services will improve Outcome: Progressing Goal: Ability to manage health-related needs will improve Outcome: Progressing   Problem: Metabolic: Goal: Ability to maintain appropriate glucose levels will improve Outcome: Progressing   Problem: Nutritional: Goal: Maintenance of adequate nutrition will improve Outcome: Progressing Goal: Progress toward achieving an optimal weight will improve Outcome: Progressing   Problem: Skin Integrity: Goal: Risk for impaired skin integrity will decrease Outcome: Progressing   Problem: Tissue Perfusion: Goal: Adequacy of tissue perfusion will improve Outcome: Progressing   

## 2019-02-21 NOTE — Progress Notes (Signed)
PROGRESS NOTE    Heather [name]  [id number] DOB: April 29, 1950 DOA: 02/12/2019 PCP: Richarda Osmond, DO    Brief Narrative:  68 year old female who presented with worsening dyspnea. She is homeless and lives in a shelter, her significant past medical history includes hypertension, dyslipidemia, type 2 diabetes mellitus and diabetic neuropathy. On December 14 she was taken to the [facility], ED due to 4 days of malaise and generalized weakness, she was diagnosed with pneumonia,COVID-19 test was requested and patient was discharged home with azithromycin. 4 days later she returned to the hospital due to persistent symptoms, when EMS arrived to the shelter she was hypotensive and hypoxic.  She was hospitalized for further management.   Assessment & Plan:  Acute Hypoxic Resp. Failure/Pneumonia due to COVID-19 Patient is stable from a COVID-19 standpoint.  She has completed all treatments including remdesivir and steroids.  She was also given Actemra.  Inflammatory markers have improved.  She tends to desaturate with ambulation.  She will need home oxygen. Outpatient pulmonology consultation.  Discharge when she has safe disposition.    Diabetes mellitus type 2, uncontrolled with hyperglycemia HbA1c 7.6.  Elevated CBGs due to steroids.  Remains on Lantus.  CBG should improve as steroid is tapered down.   History of dyslipidemia Continue statin.  Essential hypertension Occasional high readings noted.  Continue current regimen for now.  Patient noted to be on amlodipine, carvedilol, Imdur.  Acute kidney injury Creatinine peaked at 1.68.  Improved subsequently.  DVT prophylaxis:enoxaparin Code Status:full Family Communication:Discussed with the patient Disposition Plan:She is homeless.  Social worker was unable to reach the Health visitor last week.  We will need to try again tomorrow.  She needs home oxygen.   Subjective: Patient states that she continues to feel well.   Denies any shortness of breath.  Objective: Vitals:   02/20/19 1604 02/20/19 2141 02/21/19 0610 02/21/19 0745  BP: 139/74 (!) 173/83 (!) 174/90 (!) 158/83  Pulse: 76 70 61 67  Resp:      Temp: 97.7 F (36.5 C) 98 F (36.7 C) 97.7 F (36.5 C) 97.8 F (36.6 C)  TempSrc: Oral Oral Axillary Oral  SpO2: 95% 95% 94% 92%  Weight:      Height:        Intake/Output Summary (Last 24 hours) at 02/21/2019 1422 Last data filed at 02/20/2019 2141 Gross per 24 hour  Intake 240 ml  Output --  Net 240 ml   Filed Weights   02/12/19 2045 02/13/19 0536  Weight: 92.1 kg 90.2 kg    Examination:   General appearance: Awake alert.  In no distress Resp: Improved air entry bilaterally.  Crackles at the bases.  No wheezing or rhonchi. Cardio: S1-S2 is normal regular.  No S3-S4.  No rubs murmurs or bruit GI: Abdomen is soft.  Nontender nondistended.  Bowel sounds are present normal.  No masses organomegaly Extremities: No edema.  Full range of motion of lower extremities. Neurologic: Alert and oriented x3.  No focal neurological deficits.     Data Reviewed: I have personally reviewed following labs and imaging studies  CBC: No results for input(s): WBC, NEUTROABS, HGB, HCT, MCV, PLT in the last 168 hours.  Basic Metabolic Panel: Recent Labs  Lab 02/15/19 0446 02/16/19 0228 02/17/19 0025  NA 138 140 138  K 4.5 4.1 4.3  CL 103 106 105  CO2 26 25 23   GLUCOSE 310* 201* 305*  BUN 50* 47* 51*  CREATININE 1.51* 1.27* 1.29*  CALCIUM  8.4* 8.7* 9.0   GFR: Estimated Creatinine Clearance: 48.1 mL/min (A) (by C-G formula based on SCr of 1.29 mg/dL (H)). Liver Function Tests: Recent Labs  Lab 02/15/19 0446 02/16/19 0228 02/17/19 0025  AST 29 42* 49*  ALT 27 35 54*  ALKPHOS 59 61 77  BILITOT 0.7 0.2* 0.4  PROT 6.4* 6.4* 6.9  ALBUMIN 2.6* 2.6* 2.8*   CBG: Recent Labs  Lab 02/20/19 1145 02/20/19 1709 02/20/19 2059 02/21/19 0748 02/21/19 1153  GLUCAP 309* 355* 336* 198* 188*    Anemia Panel: No results for input(s): VITAMINB12, FOLATE, FERRITIN, TIBC, IRON, RETICCTPCT in the last 72 hours.    Radiology Studies: No results found.      Scheduled Meds: . amLODipine  10 mg Oral Daily  . vitamin C  500 mg Oral Daily  . aspirin  81 mg Oral Daily  . atorvastatin  40 mg Oral Daily  . carvedilol  3.125 mg Oral BID WC  . chlorpheniramine-HYDROcodone  5 mL Oral Q12H  . enoxaparin (LOVENOX) injection  40 mg Subcutaneous Q24H  . famotidine  20 mg Oral Daily  . feeding supplement (ENSURE ENLIVE)  237 mL Oral BID BM  . feeding supplement (PRO-STAT SUGAR FREE 64)  30 mL Oral TID WC  . gabapentin  600 mg Oral QHS  . insulin aspart  0-15 Units Subcutaneous TID WC  . insulin aspart  5 Units Subcutaneous TID WC  . insulin glargine  25 Units Subcutaneous Daily  . isosorbide mononitrate  30 mg Oral Daily  . multivitamin with minerals  1 tablet Oral Daily  . polyethylene glycol  17 g Oral BID  . senna-docusate  2 tablet Oral BID  . zinc sulfate  220 mg Oral Daily   Continuous Infusions:    LOS: 9 days    Bonnielee Haff, MD

## 2019-02-22 LAB — BASIC METABOLIC PANEL
Anion gap: 8 (ref 5–15)
BUN: 36 mg/dL — ABNORMAL HIGH (ref 8–23)
CO2: 28 mmol/L (ref 22–32)
Calcium: 8.9 mg/dL (ref 8.9–10.3)
Chloride: 103 mmol/L (ref 98–111)
Creatinine, Ser: 1.26 mg/dL — ABNORMAL HIGH (ref 0.44–1.00)
GFR calc Af Amer: 51 mL/min — ABNORMAL LOW (ref >60)
GFR calc non Af Amer: 44 mL/min — ABNORMAL LOW (ref >60)
Glucose, Bld: 176 mg/dL — ABNORMAL HIGH (ref 70–99)
Potassium: 4.9 mmol/L (ref 3.5–5.1)
Sodium: 139 mmol/L (ref 135–145)

## 2019-02-22 LAB — GLUCOSE, CAPILLARY
Glucose-Capillary: 344 mg/dL — ABNORMAL HIGH (ref 70–99)
Glucose-Capillary: 169 mg/dL — ABNORMAL HIGH (ref 70–99)
Glucose-Capillary: 303 mg/dL — ABNORMAL HIGH (ref 70–99)
Glucose-Capillary: 217 mg/dL — ABNORMAL HIGH (ref 70–99)

## 2019-02-22 LAB — SARS CORONAVIRUS 2 (TAT 6-24 HRS): SARS Coronavirus 2: NEGATIVE

## 2019-02-22 MED ORDER — POLYETHYLENE GLYCOL 3350 17 G PO PACK: 17.0000 g | PACK | Freq: Every day | ORAL | 0 refills | Status: DC | PRN

## 2019-02-22 NOTE — TOC Progression Note (Signed)
Transition of Care [facility]) - Progression Note    Patient Details  Name: Heather [name] MRN: [id number] Date of Birth: August 16, 1950  [facility] CM/SW Contact  Joaquin Courts, RN Phone Number: 02/22/2019, 3:40 PM  Clinical Narrative:   CM spoke with transitional housing director donna and updated her on status of covid test.  Notified Butch Penny that testing may come back positive for a while after completing treatment and per MD current quarantine recommendations call for a 3 week quarantine that the patient can complete in her room.  Requested Butch Penny to consider an override to their negative covid test policy that would allow the patient to return and complete quarantine in her room.  Butch Penny states that that is acceptable and patient can return. Patient set up with [facility] for [facility] Pt/OT services, patient will get rolling walker and 3-in-1 delivered by [employer] to transitional housing facility at [place]. [employer] will provide O2 set up delivered to facility.  Butch Penny states delivery will have to take place tomorrow in the morning when she is in the building and can let the drivers in. DME reps notified of this and provided with address to facility as well as Donna's contact number.  Once equipment is in place patient can dc back to transitional housing facility. Patient is aware and in agreement with this plan. MD was notified of this plan as well.      Expected Discharge Plan: (uncertain at this time) Barriers to Discharge: Continued Medical Work up  Expected Discharge Plan and Services Expected Discharge Plan: (uncertain at this time)   Discharge Planning Services: CM Consult   Living arrangements for the past 2 months: Homeless Shelter Expected Discharge Date: 02/22/19                                     Social Determinants of Health (SDOH) Interventions    Readmission Risk Interventions No flowsheet data found.

## 2019-02-22 NOTE — Progress Notes (Signed)
Inpatient Diabetes Program Recommendations  AACE/ADA: New Consensus Statement on Inpatient Glycemic Control (2015)  Target Ranges:  Prepandial:   less than 140 mg/dL      Peak postprandial:   less than 180 mg/dL (1-2 hours)      Critically ill patients:  140 - 180 mg/dL   Lab Results  Component Value Date   GLUCAP 169 (H) 02/22/2019   HGBA1C 9.4 (H) 02/12/2019    Review of Glycemic Control Results for Heather [name], Heather [name] (MRN [id number]) as of 02/22/2019 11:10  Ref. Range 02/21/2019 07:48 02/21/2019 11:53 02/21/2019 17:01 02/21/2019 21:41 02/22/2019 08:03  Glucose-Capillary Latest Ref Range: 70 - 99 mg/dL 198 (H) 188 (H) 322 (H) 284 (H) 169 (H)   Diabetes history: DM 2 Outpatient Diabetes medications:  Tresiba 32 units daily, Victoza 0.6 mg daily, Metformin 1000 mg bid Current orders for Inpatient glycemic control:  Novolog moderate tid with meals Novolog 5 units tid with meals Lantus 25 units daily  Inpatient Diabetes Program Recommendations:    Note that supplements switched to glucerna shakes.  Agree with current orders.   Thanks,  Adah Perl, RN, BC-ADM Inpatient Diabetes Coordinator Pager [phone]

## 2019-02-22 NOTE — Discharge Instructions (Signed)
COVID-19 COVID-19 is a respiratory infection that is caused by a virus called severe acute respiratory syndrome coronavirus 2 (SARS-CoV-2). The disease is also known as coronavirus disease or novel coronavirus. In some people, the virus may not cause any symptoms. In others, it may cause a serious infection. The infection can get worse quickly and can lead to complications, such as:  Pneumonia, or infection of the lungs.  Acute respiratory distress syndrome or ARDS. This is fluid build-up in the lungs.  Acute respiratory failure. This is a condition in which there is not enough oxygen passing from the lungs to the body.  Sepsis or septic shock. This is a serious bodily reaction to an infection.  Blood clotting problems.  Secondary infections due to bacteria or fungus. The virus that causes COVID-19 is contagious. This means that it can spread from person to person through droplets from coughs and sneezes (respiratory secretions). What are the causes? This illness is caused by a virus. You may catch the virus by:  Breathing in droplets from an infected person's cough or sneeze.  Touching something, like a table or a doorknob, that was exposed to the virus (contaminated) and then touching your mouth, nose, or eyes. What increases the risk? Risk for infection You are more likely to be infected with this virus if you:  Live in or travel to an area with a COVID-19 outbreak.  Come in contact with a sick person who recently traveled to an area with a COVID-19 outbreak.  Provide care for or live with a person who is infected with COVID-19. Risk for serious illness You are more likely to become seriously ill from the virus if you:  Are 68 years of age or older.  Have a long-term disease that lowers your body's ability to fight infection (immunocompromised).  Live in a nursing home or long-term care facility.  Have a long-term (chronic) disease such as: ? Chronic lung disease,  including chronic obstructive pulmonary disease or asthma ? Heart disease. ? Diabetes. ? Chronic kidney disease. ? Liver disease.  Are obese. What are the signs or symptoms? Symptoms of this condition can range from mild to severe. Symptoms may appear any time from 2 to 14 days after being exposed to the virus. They include:  A fever.  A cough.  Difficulty breathing.  Chills.  Muscle pains.  A sore throat.  Loss of taste or smell. Some people may also have stomach problems, such as nausea, vomiting, or diarrhea. Other people may not have any symptoms of COVID-19. How is this diagnosed? This condition may be diagnosed based on:  Your signs and symptoms, especially if: ? You live in an area with a COVID-19 outbreak. ? You recently traveled to or from an area where the virus is common. ? You provide care for or live with a person who was diagnosed with COVID-19.  A physical exam.  Lab tests, which may include: ? A nasal swab to take a sample of fluid from your nose. ? A throat swab to take a sample of fluid from your throat. ? A sample of mucus from your lungs (sputum). ? Blood tests.  Imaging tests, which may include, X-rays, CT scan, or ultrasound. How is this treated? At present, there is no medicine to treat COVID-19. Medicines that treat other diseases are being used on a trial basis to see if they are effective against COVID-19. Your health care provider will talk with you about ways to treat your symptoms. For  most people, the infection is mild and can be managed at home with rest, fluids, and over-the-counter medicines. Treatment for a serious infection usually takes places in a hospital intensive care unit (ICU). It may include one or more of the following treatments. These treatments are given until your symptoms improve.  Receiving fluids and medicines through an IV.  Supplemental oxygen. Extra oxygen is given through a tube in the nose, a face mask, or a  hood.  Positioning you to lie on your stomach (prone position). This makes it easier for oxygen to get into the lungs.  Continuous positive airway pressure (CPAP) or bi-level positive airway pressure (BPAP) machine. This treatment uses mild air pressure to keep the airways open. A tube that is connected to a motor delivers oxygen to the body.  Ventilator. This treatment moves air into and out of the lungs by using a tube that is placed in your windpipe.  Tracheostomy. This is a procedure to create a hole in the neck so that a breathing tube can be inserted.  Extracorporeal membrane oxygenation (ECMO). This procedure gives the lungs a chance to recover by taking over the functions of the heart and lungs. It supplies oxygen to the body and removes carbon dioxide. Follow these instructions at home: Lifestyle  If you are sick, stay home except to get medical care. Your health care provider will tell you how long to stay home. Call your health care provider before you go for medical care.  Rest at home as told by your health care provider.  Do not use any products that contain nicotine or tobacco, such as cigarettes, e-cigarettes, and chewing tobacco. If you need help quitting, ask your health care provider.  Return to your normal activities as told by your health care provider. Ask your health care provider what activities are safe for you. General instructions  Take over-the-counter and prescription medicines only as told by your health care provider.  Drink enough fluid to keep your urine pale yellow.  Keep all follow-up visits as told by your health care provider. This is important. How is this prevented?  There is no vaccine to help prevent COVID-19 infection. However, there are steps you can take to protect yourself and others from this virus. To protect yourself:   Do not travel to areas where COVID-19 is a risk. The areas where COVID-19 is reported change often. To identify  high-risk areas and travel restrictions, check the CDC travel website: wwwnc.cdc.gov/travel/notices  If you live in, or must travel to, an area where COVID-19 is a risk, take precautions to avoid infection. ? Stay away from people who are sick. ? Wash your hands often with soap and water for 20 seconds. If soap and water are not available, use an alcohol-based hand sanitizer. ? Avoid touching your mouth, face, eyes, or nose. ? Avoid going out in public, follow guidance from your state and local health authorities. ? If you must go out in public, wear a cloth face covering or face mask. ? Disinfect objects and surfaces that are frequently touched every day. This may include:  Counters and tables.  Doorknobs and light switches.  Sinks and faucets.  Electronics, such as phones, remote controls, keyboards, computers, and tablets. To protect others: If you have symptoms of COVID-19, take steps to prevent the virus from spreading to others.  If you think you have a COVID-19 infection, contact your health care provider right away. Tell your health care team that you think you   may have a COVID-19 infection.  Stay home. Leave your house only to seek medical care. Do not use public transport.  Do not travel while you are sick.  Wash your hands often with soap and water for 20 seconds. If soap and water are not available, use alcohol-based hand sanitizer.  Stay away from other members of your household. Let healthy household members care for children and pets, if possible. If you have to care for children or pets, wash your hands often and wear a mask. If possible, stay in your own room, separate from others. Use a different bathroom.  Make sure that all people in your household wash their hands well and often.  Cough or sneeze into a tissue or your sleeve or elbow. Do not cough or sneeze into your hand or into the air.  Wear a cloth face covering or face mask. Where to find more  information  Centers for Disease Control and Prevention: PurpleGadgets.be  World Health Organization: https://www.castaneda.info/ Contact a health care provider if:  You live in or have traveled to an area where COVID-19 is a risk and you have symptoms of the infection.  You have had contact with someone who has COVID-19 and you have symptoms of the infection. Get help right away if:  You have trouble breathing.  You have pain or pressure in your chest.  You have confusion.  You have bluish lips and fingernails.  You have difficulty waking from sleep.  You have symptoms that get worse. These symptoms may represent a serious problem that is an emergency. Do not wait to see if the symptoms will go away. Get medical help right away. Call your local emergency services (911 in the [place] Do not drive yourself to the hospital. Let the emergency medical personnel know if you think you have COVID-19. Summary  COVID-19 is a respiratory infection that is caused by a virus. It is also known as coronavirus disease or novel coronavirus. It can cause serious infections, such as pneumonia, acute respiratory distress syndrome, acute respiratory failure, or sepsis.  The virus that causes COVID-19 is contagious. This means that it can spread from person to person through droplets from coughs and sneezes.  You are more likely to develop a serious illness if you are 29 years of age or older, have a weak immunity, live in a nursing home, or have chronic disease.  There is no medicine to treat COVID-19. Your health care provider will talk with you about ways to treat your symptoms.  Take steps to protect yourself and others from infection. Wash your hands often and disinfect objects and surfaces that are frequently touched every day. Stay away from people who are sick and wear a mask if you are sick. This information is not intended to replace advice given to you by  your health care provider. Make sure you discuss any questions you have with your health care provider. Document Released: 03/19/2018 Document Revised: 07/09/2018 Document Reviewed: 03/19/2018 Elsevier Patient Education  2020 [employer].   COVID-19 Frequently Asked Questions COVID-19 (coronavirus disease) is an infection that is caused by a large family of viruses. Some viruses cause illness in people and others cause illness in animals like camels, cats, and bats. In some cases, the viruses that cause illness in animals can spread to humans. Where did the coronavirus come from? In December 2019, [place] told the [facility]) of several cases of lung disease (human respiratory illness). These cases were linked to  an open seafood and livestock market in the city of [place]. The link to the seafood and livestock market suggests that the virus may have spread from animals to humans. However, since that first outbreak in December, the virus has also been shown to spread from person to person. What is the name of the disease and the virus? Disease name Early on, this disease was called novel coronavirus. This is because scientists determined that the disease was caused by a new (novel) respiratory virus. The World Health Organization [facility]) has now named the disease COVID-19, or coronavirus disease. Virus name The virus that causes the disease is called severe acute respiratory syndrome coronavirus 2 (SARS-CoV-2). More information on disease and virus naming World Health Organization [facility]): www.who.int/emergencies/diseases/novel-coronavirus-2019/technical-guidance/naming-the-coronavirus-disease-(covid-2019)-and-the-virus-that-causes-it Who is at risk for complications from coronavirus disease? Some people may be at higher risk for complications from coronavirus disease. This includes older adults and people who have chronic diseases, such as heart disease, diabetes, and lung disease. If  you are at higher risk for complications, take these extra precautions:  Avoid close contact with people who are sick or have a fever or cough. Stay at least 3-6 ft (1-2 m) away from them, if possible.  Wash your hands often with soap and water for at least 20 seconds.  Avoid touching your face, mouth, nose, or eyes.  Keep supplies on hand at home, such as food, medicine, and cleaning supplies.  Stay home as much as possible.  Avoid social gatherings and travel. How does coronavirus disease spread? The virus that causes coronavirus disease spreads easily from person to person (is contagious). There are also cases of community-spread disease. This means the disease has spread to:  People who have no known contact with other infected people.  People who have not traveled to areas where there are known cases. It appears to spread from one person to another through droplets from coughing or sneezing. Can I get the virus from touching surfaces or objects? There is still a lot that we do not know about the virus that causes coronavirus disease. Scientists are basing a lot of information on what they know about similar viruses, such as:  Viruses cannot generally survive on surfaces for long. They need a human body (host) to survive.  It is more likely that the virus is spread by close contact with people who are sick (direct contact), such as through: ? Shaking hands or hugging. ? Breathing in respiratory droplets that travel through the air. This can happen when an infected person coughs or sneezes on or near other people.  It is less likely that the virus is spread when a person touches a surface or object that has the virus on it (indirect contact). The virus may be able to enter the body if the person touches a surface or object and then touches his or her face, eyes, nose, or mouth. Can a person spread the virus without having symptoms of the disease? It may be possible for the virus to  spread before a person has symptoms of the disease, but this is most likely not the main way the virus is spreading. It is more likely for the virus to spread by being in close contact with people who are sick and breathing in the respiratory droplets of a sick person's cough or sneeze. What are the symptoms of coronavirus disease? Symptoms vary from person to person and can range from mild to severe. Symptoms may include:  Fever.  Cough.  Tiredness,  weakness, or fatigue.  Fast breathing or feeling short of breath. These symptoms can appear anywhere from 2 to 14 days after you have been exposed to the virus. If you develop symptoms, call your health care provider. People with severe symptoms may need hospital care. If I am exposed to the virus, how long does it take before symptoms start? Symptoms of coronavirus disease may appear anywhere from 2 to 14 days after a person has been exposed to the virus. If you develop symptoms, call your health care provider. Should I be tested for this virus? Your health care provider will decide whether to test you based on your symptoms, history of exposure, and your risk factors. How does a health care provider test for this virus? Health care providers will collect samples to send for testing. Samples may include:  Taking a swab of fluid from the nose.  Taking fluid from the lungs by having you cough up mucus (sputum) into a sterile cup.  Taking a blood sample.  Taking a stool or urine sample. Is there a treatment or vaccine for this virus? Currently, there is no vaccine to prevent coronavirus disease. Also, there are no medicines like antibiotics or antivirals to treat the virus. A person who becomes sick is given supportive care, which means rest and fluids. A person may also relieve his or her symptoms by using over-the-counter medicines that treat sneezing, coughing, and runny nose. These are the same medicines that a person takes for the common  cold. If you develop symptoms, call your health care provider. People with severe symptoms may need hospital care. What can I do to protect myself and my family from this virus?     You can protect yourself and your family by taking the same actions that you would take to prevent the spread of other viruses. Take the following actions:  Wash your hands often with soap and water for at least 20 seconds. If soap and water are not available, use alcohol-based hand sanitizer.  Avoid touching your face, mouth, nose, or eyes.  Cough or sneeze into a tissue, sleeve, or elbow. Do not cough or sneeze into your hand or the air. ? If you cough or sneeze into a tissue, throw it away immediately and wash your hands.  Disinfect objects and surfaces that you frequently touch every day.  Avoid close contact with people who are sick or have a fever or cough. Stay at least 3-6 ft (1-2 m) away from them, if possible.  Stay home if you are sick, except to get medical care. Call your health care provider before you get medical care.  Make sure your vaccines are up to date. Ask your health care provider what vaccines you need. What should I do if I need to travel? Follow travel recommendations from your local health authority, the CDC, and WHO. Travel information and advice  Centers for Disease Control and Prevention (CDC): BodyEditor.hu  World Health Organization [facility]): ThirdIncome.ca Know the risks and take action to protect your health  You are at higher risk of getting coronavirus disease if you are traveling to areas with an outbreak or if you are exposed to travelers from areas with an outbreak.  Wash your hands often and practice good hygiene to lower the risk of catching or spreading the virus. What should I do if I am sick? General instructions to stop the spread of infection  Wash your hands often  with soap and water for at least 20  seconds. If soap and water are not available, use alcohol-based hand sanitizer.  Cough or sneeze into a tissue, sleeve, or elbow. Do not cough or sneeze into your hand or the air.  If you cough or sneeze into a tissue, throw it away immediately and wash your hands.  Stay home unless you must get medical care. Call your health care provider or local health authority before you get medical care.  Avoid public areas. Do not take public transportation, if possible.  If you can, wear a mask if you must go out of the house or if you are in close contact with someone who is not sick. Keep your home clean  Disinfect objects and surfaces that are frequently touched every day. This may include: ? Counters and tables. ? Doorknobs and light switches. ? Sinks and faucets. ? Electronics such as phones, remote controls, keyboards, computers, and tablets.  Wash dishes in hot, soapy water or use a dishwasher. Air-dry your dishes.  Wash laundry in hot water. Prevent infecting other household members  Let healthy household members care for children and pets, if possible. If you have to care for children or pets, wash your hands often and wear a mask.  Sleep in a different bedroom or bed, if possible.  Do not share personal items, such as razors, toothbrushes, deodorant, combs, brushes, towels, and washcloths. Where to find more information Centers for Disease Control and Prevention (CDC)  Information and news updates: https://www.butler-gonzalez.com/ World Health Organization [facility])  Information and news updates: MissExecutive.com.ee  Coronavirus health topic: https://www.castaneda.info/  Questions and answers on COVID-19: OpportunityDebt.at  Global tracker: who.sprinklr.com American Academy of Pediatrics (AAP)  Information for families:  www.healthychildren.org/English/health-issues/conditions/chest-lungs/Pages/2019-Novel-Coronavirus.aspx The coronavirus situation is changing rapidly. Check your local health authority website or the CDC and [facility] websites for updates and news. When should I contact a health care provider?  Contact your health care provider if you have symptoms of an infection, such as fever or cough, and you: ? Have been near anyone who is known to have coronavirus disease. ? Have come into contact with a person who is suspected to have coronavirus disease. ? Have traveled outside of the country. When should I get emergency medical care?  Get help right away by calling your local emergency services (911 in the [place] if you have: ? Trouble breathing. ? Pain or pressure in your chest. ? Confusion. ? Blue-tinged lips and fingernails. ? Difficulty waking from sleep. ? Symptoms that get worse. Let the emergency medical personnel know if you think you have coronavirus disease. Summary  A new respiratory virus is spreading from person to person and causing COVID-19 (coronavirus disease).  The virus that causes COVID-19 appears to spread easily. It spreads from one person to another through droplets from coughing or sneezing.  Older adults and those with chronic diseases are at higher risk of disease. If you are at higher risk for complications, take extra precautions.  There is currently no vaccine to prevent coronavirus disease. There are no medicines, such as antibiotics or antivirals, to treat the virus.  You can protect yourself and your family by washing your hands often, avoiding touching your face, and covering your coughs and sneezes. This information is not intended to replace advice given to you by your health care provider. Make sure you discuss any questions you have with your health care provider. Document Released: 06/09/2018 Document Revised: 06/09/2018 Document Reviewed: 06/09/2018 Elsevier  Patient Education  2020 [employer]. COVID-19 COVID-19 is a respiratory  infection that is caused by a virus called severe acute respiratory syndrome coronavirus 2 (SARS-CoV-2). The disease is also known as coronavirus disease or novel coronavirus. In some people, the virus may not cause any symptoms. In others, it may cause a serious infection. The infection can get worse quickly and can lead to complications, such as:  Pneumonia, or infection of the lungs.  Acute respiratory distress syndrome or ARDS. This is fluid build-up in the lungs.  Acute respiratory failure. This is a condition in which there is not enough oxygen passing from the lungs to the body.  Sepsis or septic shock. This is a serious bodily reaction to an infection.  Blood clotting problems.  Secondary infections due to bacteria or fungus. The virus that causes COVID-19 is contagious. This means that it can spread from person to person through droplets from coughs and sneezes (respiratory secretions). What are the causes? This illness is caused by a virus. You may catch the virus by:  Breathing in droplets from an infected person's cough or sneeze.  Touching something, like a table or a doorknob, that was exposed to the virus (contaminated) and then touching your mouth, nose, or eyes. What increases the risk? Risk for infection You are more likely to be infected with this virus if you:  Live in or travel to an area with a COVID-19 outbreak.  Come in contact with a sick person who recently traveled to an area with a COVID-19 outbreak.  Provide care for or live with a person who is infected with COVID-19. Risk for serious illness You are more likely to become seriously ill from the virus if you:  Are 68 years of age or older.  Have a long-term disease that lowers your body's ability to fight infection (immunocompromised).  Live in a nursing home or long-term care facility.  Have a long-term (chronic) disease  such as: ? Chronic lung disease, including chronic obstructive pulmonary disease or asthma ? Heart disease. ? Diabetes. ? Chronic kidney disease. ? Liver disease.  Are obese. What are the signs or symptoms? Symptoms of this condition can range from mild to severe. Symptoms may appear any time from 2 to 14 days after being exposed to the virus. They include:  A fever.  A cough.  Difficulty breathing.  Chills.  Muscle pains.  A sore throat.  Loss of taste or smell. Some people may also have stomach problems, such as nausea, vomiting, or diarrhea. Other people may not have any symptoms of COVID-19. How is this diagnosed? This condition may be diagnosed based on:  Your signs and symptoms, especially if: ? You live in an area with a COVID-19 outbreak. ? You recently traveled to or from an area where the virus is common. ? You provide care for or live with a person who was diagnosed with COVID-19.  A physical exam.  Lab tests, which may include: ? A nasal swab to take a sample of fluid from your nose. ? A throat swab to take a sample of fluid from your throat. ? A sample of mucus from your lungs (sputum). ? Blood tests.  Imaging tests, which may include, X-rays, CT scan, or ultrasound. How is this treated? At present, there is no medicine to treat COVID-19. Medicines that treat other diseases are being used on a trial basis to see if they are effective against COVID-19. Your health care provider will talk with you about ways to treat your symptoms. For most people, the infection is mild  and can be managed at home with rest, fluids, and over-the-counter medicines. Treatment for a serious infection usually takes places in a hospital intensive care unit (ICU). It may include one or more of the following treatments. These treatments are given until your symptoms improve.  Receiving fluids and medicines through an IV.  Supplemental oxygen. Extra oxygen is given through a tube  in the nose, a face mask, or a hood.  Positioning you to lie on your stomach (prone position). This makes it easier for oxygen to get into the lungs.  Continuous positive airway pressure (CPAP) or bi-level positive airway pressure (BPAP) machine. This treatment uses mild air pressure to keep the airways open. A tube that is connected to a motor delivers oxygen to the body.  Ventilator. This treatment moves air into and out of the lungs by using a tube that is placed in your windpipe.  Tracheostomy. This is a procedure to create a hole in the neck so that a breathing tube can be inserted.  Extracorporeal membrane oxygenation (ECMO). This procedure gives the lungs a chance to recover by taking over the functions of the heart and lungs. It supplies oxygen to the body and removes carbon dioxide. Follow these instructions at home: Lifestyle  If you are sick, stay home except to get medical care. Your health care provider will tell you how long to stay home. Call your health care provider before you go for medical care.  Rest at home as told by your health care provider.  Do not use any products that contain nicotine or tobacco, such as cigarettes, e-cigarettes, and chewing tobacco. If you need help quitting, ask your health care provider.  Return to your normal activities as told by your health care provider. Ask your health care provider what activities are safe for you. General instructions  Take over-the-counter and prescription medicines only as told by your health care provider.  Drink enough fluid to keep your urine pale yellow.  Keep all follow-up visits as told by your health care provider. This is important. How is this prevented?  There is no vaccine to help prevent COVID-19 infection. However, there are steps you can take to protect yourself and others from this virus. To protect yourself:   Do not travel to areas where COVID-19 is a risk. The areas where COVID-19 is reported  change often. To identify high-risk areas and travel restrictions, check the CDC travel website: FatFares.com.br  If you live in, or must travel to, an area where COVID-19 is a risk, take precautions to avoid infection. ? Stay away from people who are sick. ? Wash your hands often with soap and water for 20 seconds. If soap and water are not available, use an alcohol-based hand sanitizer. ? Avoid touching your mouth, face, eyes, or nose. ? Avoid going out in public, follow guidance from your state and local health authorities. ? If you must go out in public, wear a cloth face covering or face mask. ? Disinfect objects and surfaces that are frequently touched every day. This may include:  Counters and tables.  Doorknobs and light switches.  Sinks and faucets.  Electronics, such as phones, remote controls, keyboards, computers, and tablets. To protect others: If you have symptoms of COVID-19, take steps to prevent the virus from spreading to others.  If you think you have a COVID-19 infection, contact your health care provider right away. Tell your health care team that you think you may have a COVID-19 infection.  Stay home. Leave your house only to seek medical care. Do not use public transport.  Do not travel while you are sick.  Wash your hands often with soap and water for 20 seconds. If soap and water are not available, use alcohol-based hand sanitizer.  Stay away from other members of your household. Let healthy household members care for children and pets, if possible. If you have to care for children or pets, wash your hands often and wear a mask. If possible, stay in your own room, separate from others. Use a different bathroom.  Make sure that all people in your household wash their hands well and often.  Cough or sneeze into a tissue or your sleeve or elbow. Do not cough or sneeze into your hand or into the air.  Wear a cloth face covering or face mask. Where  to find more information  Centers for Disease Control and Prevention: PurpleGadgets.be  World Health Organization: https://www.castaneda.info/ Contact a health care provider if:  You live in or have traveled to an area where COVID-19 is a risk and you have symptoms of the infection.  You have had contact with someone who has COVID-19 and you have symptoms of the infection. Get help right away if:  You have trouble breathing.  You have pain or pressure in your chest.  You have confusion.  You have bluish lips and fingernails.  You have difficulty waking from sleep.  You have symptoms that get worse. These symptoms may represent a serious problem that is an emergency. Do not wait to see if the symptoms will go away. Get medical help right away. Call your local emergency services (911 in the [place] Do not drive yourself to the hospital. Let the emergency medical personnel know if you think you have COVID-19. Summary  COVID-19 is a respiratory infection that is caused by a virus. It is also known as coronavirus disease or novel coronavirus. It can cause serious infections, such as pneumonia, acute respiratory distress syndrome, acute respiratory failure, or sepsis.  The virus that causes COVID-19 is contagious. This means that it can spread from person to person through droplets from coughs and sneezes.  You are more likely to develop a serious illness if you are 54 years of age or older, have a weak immunity, live in a nursing home, or have chronic disease.  There is no medicine to treat COVID-19. Your health care provider will talk with you about ways to treat your symptoms.  Take steps to protect yourself and others from infection. Wash your hands often and disinfect objects and surfaces that are frequently touched every day. Stay away from people who are sick and wear a mask if you are sick. This information is not intended to replace advice  given to you by your health care provider. Make sure you discuss any questions you have with your health care provider. Document Released: 03/19/2018 Document Revised: 07/09/2018 Document Reviewed: 03/19/2018 Elsevier Patient Education  2020 [employer].

## 2019-02-22 NOTE — Discharge Summary (Signed)
[facility]  Physician Discharge Summary   Patient ID: Heather [name] MRN: [id number] DOB/AGE: [birth year] 68 y.o.  Admit date: 02/12/2019 Discharge date: 02/22/2019  PCP: Richarda Osmond, DO  DISCHARGE DIAGNOSES:  Acute respiratory failure with hypoxia requiring home oxygen Pneumonia due to COVID-19 Diabetes mellitus type 2, uncontrolled with hyperglycemia Dyslipidemia Essential hypertension   RECOMMENDATIONS FOR OUTPATIENT FOLLOW UP: 1. Home oxygen being prescribed 2. Referred to pulmonology   Home Health: None Equipment/Devices: Home oxygen  CODE STATUS: Full code  DISCHARGE CONDITION: fair  Diet recommendation: Modified carbohydrate  INITIAL HISTORY: 68 year old female who presented with worsening dyspnea. She is homeless and lives in a shelter, her significant past medical history includes hypertension, dyslipidemia, type 2 diabetes mellitus and diabetic neuropathy. On December 14 she was taken to the [facility], ED due to 4 days of malaise and generalized weakness, she was diagnosed with pneumonia,COVID-19 test was requested and patient was discharged home with azithromycin. 4 days later she returned to the hospital due to persistent symptoms, when EMS arrived to the shelter she was hypotensive and hypoxic.  She was hospitalized for further management.   HOSPITAL COURSE:   Acute Hypoxic Resp. Failure/Pneumonia due to COVID-19 Patient was hospitalized.  She was started on remdesivir and steroids.  She was also given Actemra.  She started improving.  Inflammatory markers improved.  She does tend to desaturate with ambulation and so will need home oxygen which will be arranged.  Ambulatory referral sent to pulmonology for follow-up.    Diabetes mellitus type 2, uncontrolled with hyperglycemia HbA1c 7.6.  Elevated CBGs due to steroids.  Should improve as steroid is tapered down.  Continue with her home medication regimen.  History of  dyslipidemia Continue statin.  Essential hypertension Continue with home medication regimen.    Acute kidney injury Creatinine peaked at 1.68.  Improved subsequently.  Obesity Estimated body mass index is 31.15 kg/m as calculated from the following:   Height as of this encounter: 5\' 7"  (1.702 m).   Weight as of this encounter: 90.2 kg.  Overall stable.  Patient is okay for discharge.  However she is homeless and lives in a shelter.  Social worker and case management trying to figure out how she can go back with home oxygen.  COVID-19 status also appears to be an issue.  PERTINENT LABS:  The results of significant diagnostics from this hospitalization (including imaging, microbiology, ancillary and laboratory) are listed below for reference.    Microbiology: Recent Results (from the past 240 hour(s))  Blood Culture (routine x 2)     Status: None   Collection Time: 02/12/19  3:48 PM   Specimen: BLOOD LEFT FOREARM  Result Value Ref Range Status   Specimen Description   Final    BLOOD LEFT FOREARM Performed at [facility] [address] [place], [place] [address]    Special Requests   Final    BOTTLES DRAWN AEROBIC AND ANAEROBIC Blood Culture results may not be optimal due to an inadequate volume of blood received in culture bottles   Culture   Final    NO GROWTH 5 DAYS Performed at [facility], [place] [address], [place], [place] [address]    Report Status 02/17/2019 FINAL  Final  MRSA PCR Screening     Status: None   Collection Time: 02/13/19  6:05 AM   Specimen: Nasopharyngeal  Result Value Ref Range Status   MRSA by PCR NEGATIVE NEGATIVE Final    Comment:  The GeneXpert MRSA Assay (FDA approved for NASAL specimens only), is one component of a comprehensive MRSA colonization surveillance program. It is not intended to diagnose MRSA infection nor to guide or monitor treatment for MRSA infections. Performed at [facility], [place] [address] [place], [place] [address]      Labs:    Basic Metabolic Panel: Recent Labs  Lab 02/16/19 0228 02/17/19 0025 02/22/19 0640  NA 140 138 139  K 4.1 4.3 4.9  CL 106 105 103  CO2 25 23 28   GLUCOSE 201* 305* 176*  BUN 47* 51* 36*  CREATININE 1.27* 1.29* 1.26*  CALCIUM 8.7* 9.0 8.9   Liver Function Tests: Recent Labs  Lab 02/16/19 0228 02/17/19 0025  AST 42* 49*  ALT 35 54*  ALKPHOS 61 77  BILITOT 0.2* 0.4  PROT 6.4* 6.9  ALBUMIN 2.6* 2.8*    CBG: Recent Labs  Lab 02/21/19 1153 02/21/19 1701 02/21/19 2141 02/22/19 0803 02/22/19 1135  GLUCAP 188* 322* 284* 169* 303*     IMAGING STUDIES DG Chest Port 1 View  Result Date: 02/12/2019 CLINICAL DATA:  68 year old female with COVID EXAM: PORTABLE CHEST 1 VIEW COMPARISON:  02/09/2019 FINDINGS: Cardiomediastinal silhouette unchanged in size and contour. Low lung volumes with multifocal reticulonodular opacities, worsened from the comparison. No pneumothorax. No pleural effusion. IMPRESSION: Worsening multifocal reticulonodular opacities, compatible with multifocal infection. Electronically Signed   By: Corrie Mckusick D.O.   On: 02/12/2019 15:38   DG Chest Portable 1 View  Result Date: 02/09/2019 CLINICAL DATA:  COVID-19 positive.  Cough and shortness of breath EXAM: PORTABLE CHEST 1 VIEW COMPARISON:  December 07, 2016 FINDINGS: There are patchy foci of ill-defined opacity in the right lower lobe. There is atelectatic change in the left base region with minimal left pleural effusion. Heart is upper normal in size with pulmonary vascularity normal. No adenopathy. No bone lesions. IMPRESSION: Patchy areas of ill-defined opacity in the right lower lobe, suspicious for early pneumonia. Note that several of these opacities appear rather nodular; follow-up study in 3-4 weeks advised in this regard. Atelectatic change left base with minimal left pleural effusion. Stable cardiac silhouette. No adenopathy.  Followup PA and lateral chest radiographs recommended in 3-4 weeks following trial of antibiotic therapy to ensure resolution and exclude underlying malignancy. Electronically Signed   By: Lowella Grip III M.D.   On: 02/09/2019 08:10    DISCHARGE EXAMINATION: Vitals:   02/21/19 1604 02/21/19 2000 02/22/19 0352 02/22/19 0800  BP: 136/77 135/87 (!) 142/85 (!) 152/80  Pulse: 83 81 66 61  Resp: 16 15 17 18   Temp: 98 F (36.7 C) 98 F (36.7 C) 97.7 F (36.5 C) 98 F (36.7 C)  TempSrc: Oral Oral Oral Oral  SpO2: 93% 94%  95%  Weight:      Height:       General appearance: Awake alert.  In no distress Resp:   Normal effort.  Few crackles at the bases.  No wheezing or rhonchi. Cardio: S1-S2 is normal regular.  No S3-S4.  No rubs murmurs or bruit GI: Abdomen is soft.  Nontender nondistended.  Bowel sounds are present normal.  No masses organomegaly Extremities: No edema.  Full range of motion of lower extremities. Neurologic: Alert and oriented x3.  No focal neurological deficits.    DISPOSITION: Discharge to shelter as per case management and social worker  Discharge Instructions    Ambulatory referral to Pulmonology   Complete by: As directed  Covid 19 follow up in 3-4 weeks.  Being discharged on home oxygen.   Call MD for:  difficulty breathing, headache or visual disturbances   Complete by: As directed    Call MD for:  extreme fatigue   Complete by: As directed    Call MD for:  persistant dizziness or light-headedness   Complete by: As directed    Call MD for:  persistant nausea and vomiting   Complete by: As directed    Call MD for:  severe uncontrolled pain   Complete by: As directed    Call MD for:  temperature >100.4   Complete by: As directed    Diet Carb Modified   Complete by: As directed    Discharge instructions   Complete by: As directed    Use oxygen with exertion.  Follow-up with a primary care provider.  Take your medications as prescribed.  COVID 19  INSTRUCTIONS  - You are felt to be stable enough to no longer require inpatient monitoring, testing, and treatment, though you will need to follow the recommendations below: - Based on the CDC's non-test criteria for ending self-isolation: You may not return to work/leave the home until at least 21 days since symptom onset AND 3 days without a fever (without taking tylenol, ibuprofen, etc.) AND have improvement in respiratory symptoms. - Do not take NSAID medications (including, but not limited to, ibuprofen, advil, motrin, naproxen, aleve, goody's powder, etc.) - Follow up with your doctor in the next week via telehealth or seek medical attention right away if your symptoms get WORSE.  - Consider donating plasma after you have recovered (either 14 days after a negative test or 28 days after symptoms have completely resolved) because your antibodies to this virus may be helpful to give to others with life-threatening infections. Please go to the website www.[email or web address] if you would like to consider volunteering for plasma donation.    Directions for you at home:  Wear a facemask You should wear a facemask that covers your nose and mouth when you are in the same room with other people and when you visit a healthcare provider. People who live with or visit you should also wear a facemask while they are in the same room with you.  Separate yourself from other people in your home As much as possible, you should stay in a different room from other people in your home. Also, you should use a separate bathroom, if available.  Avoid sharing household items You should not share dishes, drinking glasses, cups, eating utensils, towels, bedding, or other items with other people in your home. After using these items, you should wash them thoroughly with soap and water.  Cover your coughs and sneezes Cover your mouth and nose with a tissue when you cough or sneeze, or you can cough or sneeze into your  sleeve. Throw used tissues in a lined trash can, and immediately wash your hands with soap and water for at least 20 seconds or use an alcohol-based hand rub.  Wash your [facility] your hands often and thoroughly with soap and water for at least 20 seconds. You can use an alcohol-based hand sanitizer if soap and water are not available and if your hands are not visibly dirty. Avoid touching your eyes, nose, and mouth with unwashed hands.  Directions for those who live with, or provide care at home for you:  Limit the number of people who have contact with the patient If possible, have only  one caregiver for the patient. Other household members should stay in another home or place of residence. If this is not possible, they should stay in another room, or be separated from the patient as much as possible. Use a separate bathroom, if available. Restrict visitors who do not have an essential need to be in the home.  Ensure good ventilation Make sure that shared spaces in the home have good air flow, such as from an air conditioner or an opened window, weather permitting.  Wash your hands often Wash your hands often and thoroughly with soap and water for at least 20 seconds. You can use an alcohol based hand sanitizer if soap and water are not available and if your hands are not visibly dirty. Avoid touching your eyes, nose, and mouth with unwashed hands. Use disposable paper towels to dry your hands. If not available, use dedicated cloth towels and replace them when they become wet.  Wear a facemask and gloves Wear a disposable facemask at all times in the room and gloves when you touch or have contact with the patient's blood, body fluids, and/or secretions or excretions, such as sweat, saliva, sputum, nasal mucus, vomit, urine, or feces.  Ensure the mask fits over your nose and mouth tightly, and do not touch it during use. Throw out disposable facemasks and gloves after using them. Do not  reuse. Wash your hands immediately after removing your facemask and gloves. If your personal clothing becomes contaminated, carefully remove clothing and launder. Wash your hands after handling contaminated clothing. Place all used disposable facemasks, gloves, and other waste in a lined container before disposing them with other household waste. Remove gloves and wash your hands immediately after handling these items.  Do not share dishes, glasses, or other household items with the patient Avoid sharing household items. You should not share dishes, drinking glasses, cups, eating utensils, towels, bedding, or other items with a patient who is confirmed to have, or being evaluated for, COVID-19 infection. After the person uses these items, you should wash them thoroughly with soap and water.  Wash laundry thoroughly Immediately remove and wash clothes or bedding that have blood, body fluids, and/or secretions or excretions, such as sweat, saliva, sputum, nasal mucus, vomit, urine, or feces, on them. Wear gloves when handling laundry from the patient. Read and follow directions on labels of laundry or clothing items and detergent. In general, wash and dry with the warmest temperatures recommended on the label.  Clean all areas the individual has used often Clean all touchable surfaces, such as counters, tabletops, doorknobs, bathroom fixtures, toilets, phones, keyboards, tablets, and bedside tables, every day. Also, clean any surfaces that may have blood, body fluids, and/or secretions or excretions on them. Wear gloves when cleaning surfaces the patient has come in contact with. Use a diluted bleach solution (e.g., dilute bleach with 1 part bleach and 10 parts water) or a household disinfectant with a label that says EPA-registered for coronaviruses. To make a bleach solution at home, add 1 tablespoon of bleach to 1 quart (4 cups) of water. For a larger supply, add  cup of bleach to 1 gallon (16  cups) of water. Read labels of cleaning products and follow recommendations provided on product labels. Labels contain instructions for safe and effective use of the cleaning product including precautions you should take when applying the product, such as wearing gloves or eye protection and making sure you have good ventilation during use of the product. Remove gloves and  wash hands immediately after cleaning.  Monitor yourself for signs and symptoms of illness Caregivers and household members are considered close contacts, should monitor their health, and will be asked to limit movement outside of the home to the extent possible. Follow the monitoring steps for close contacts listed on the symptom monitoring form.   If you have additional questions, contact your local health department or call the epidemiologist on call at [phone] (available 24/7). This guidance is subject to change. For the most up-to-date guidance from [facility], please refer to their website: YouBlogs.pl   You were cared for by a hospitalist during your hospital stay. If you have any questions about your discharge medications or the care you received while you were in the hospital after you are discharged, you can call the unit and asked to speak with the hospitalist on call if the hospitalist that took care of you is not available. Once you are discharged, your primary care physician will handle any further medical issues. Please note that NO REFILLS for any discharge medications will be authorized once you are discharged, as it is imperative that you return to your primary care physician (or establish a relationship with a primary care physician if you do not have one) for your aftercare needs so that they can reassess your need for medications and monitor your lab values. If you do not have a primary care physician, you can call [phone] for a physician referral.    Increase activity slowly   Complete by: As directed         Allergies as of 02/22/2019   No Known Allergies     Medication List    STOP taking these medications   azithromycin 250 MG tablet Commonly known as: Zithromax     TAKE these medications   amLODipine 10 MG tablet Commonly known as: NORVASC TAKE 1 TABLET BY MOUTH EVERY DAY   aspirin 81 MG chewable tablet Chew 1 tablet (81 mg total) by mouth daily.   atorvastatin 40 MG tablet Commonly known as: LIPITOR Take 1 tablet (40 mg total) by mouth daily.   carvedilol 3.125 MG tablet Commonly known as: COREG TAKE 1 TABLET (3.125 MG TOTAL) BY MOUTH 2 (TWO) TIMES DAILY WITH A MEAL.   gabapentin 300 MG capsule Commonly known as: NEURONTIN TAKE 2 CAPSULES (600 MG TOTAL) BY MOUTH AT BEDTIME.   glucose blood test strip Commonly known as: OneTouch Verio Use as instructed   isosorbide mononitrate 30 MG 24 hr tablet Commonly known as: IMDUR Take 1 tablet (30 mg total) by mouth daily.   liraglutide 18 MG/3ML Sopn Commonly known as: VICTOZA Inject 0.1 mLs (0.6 mg total) into the skin daily. 0.6 mg once daily for 1 week,then increase to 1.2 mg once daily,max 1.8  mg   lisinopril-hydrochlorothiazide 20-25 MG tablet Commonly known as: ZESTORETIC TAKE 1 TABLET BY MOUTH EVERY DAY   metFORMIN 1000 MG tablet Commonly known as: GLUCOPHAGE TAKE 1 TABLET (1,000 MG TOTAL) BY MOUTH 2 (TWO) TIMES DAILY WITH A MEAL. What changed: Another medication with the same name was removed. Continue taking this medication, and follow the directions you see here.   Needles & Syringes Misc 1 Container by Does not apply route daily.   nitroGLYCERIN 0.4 MG SL tablet Commonly known as: NITROSTAT Place 1 tablet (0.4 mg total) under the tongue every 5 (five) minutes as needed for chest pain.   OneTouch Delica Lancets [id number] Misc 100 Units by Does not apply route every 3 (three)  months.   polyethylene glycol 17 g packet Commonly known as: MIRALAX  / GLYCOLAX Take 17 g by mouth daily as needed.   [name] FlexTouch 200 UNIT/ML Sopn Generic drug: Insulin Degludec Inject 32-36 Units into the skin daily. What changed: how much to take            Durable Medical Equipment  (From admission, onward)         Start     Ordered   02/22/19 1224  For home use only DME oxygen  Once    Question Answer Comment  Length of Need 6 Months   Mode or (Route) Nasal cannula   Liters per Minute 3   Frequency Continuous (stationary and portable oxygen unit needed)   Oxygen conserving device Yes   Oxygen delivery system Gas      02/22/19 1225           Follow-up Information    Anderson, Chelsey L, DO. Schedule an appointment as soon as possible for a visit in 2 day(s).   Specialty: Family Medicine Contact information: [id number] [place] [address] [phone]           TOTAL DISCHARGE TIME: 35 minutes  [place]  [facility] Pager on [email or web address]  02/22/2019, 3:07 PM

## 2019-02-22 NOTE — TOC Progression Note (Signed)
Transition of Care [facility]) - Progression Note    Patient Details  Name: EDIT JIMINIAN MRN: [id number] Date of Birth: [birth year]  [facility] CM/SW Contact  Joaquin Courts, RN Phone Number: 02/22/2019, 2:35 PM  Clinical Narrative:  Cm spoke with transitional housing director Butch Penny who reports patient can return to facility but would need a negative Covid test. CM reached out to MD who states he will order. Md also states patient's are recommended to have a three week quarantine period.  CM reached out to donna and left voicemail to relay this. CM will continue to follow to see if any exceptions can be made for patient to return and quarantine in her room.  Patient was set up for HHPT/OT services with [facility], will need orders at dc.  Huey Romans reports they are out of O2 concentrators at this time.  [facility] is able to service patient and provide O2 set up. Formal referral will need to be made once discharge disposition is finalized.       Expected Discharge Plan: (uncertain at this time) Barriers to Discharge: Continued Medical Work up  Expected Discharge Plan and Services Expected Discharge Plan: (uncertain at this time)   Discharge Planning Services: CM Consult   Living arrangements for the past 2 months: Homeless Shelter Expected Discharge Date: 02/22/19                                     Social Determinants of Health (SDOH) Interventions    Readmission Risk Interventions No flowsheet data found.

## 2019-02-22 NOTE — Progress Notes (Signed)
Occupational Therapy Treatment Patient Details Name: Heather [name] MRN: [id number] DOB: [birth year] Today's Date: 02/22/2019    History of present illness 68 year old female who presented with worsening dyspnea.  She is homeless and lives in a shelter, her significant past medical history includes hypertension, dyslipidemia, type 2 diabetes mellitus and diabetic neuropathy. On December 14 she was taken to the [facility], ED due to 4 days of malaise and generalized weakness, she was diagnosed with pneumonia, COVID-19 test was requested and patient was discharged home with azithromycin.  4 days later she returned to the hospital due to persistent symptoms, when EMS arrived to the shelter she was hypotensive and hypoxic.   OT comments  Pt continues to make progress in therapy, demonstrating increased independence with self-care and functional transfer tasks as well as requiring less supplemental oxygen. Pt able to ambulate to/from bathroom and complete toileting task with supervision. Pt tolerated standing at the sink 1 x 9 min to complete grooming, hygiene, and sponge bathing task. Noted 0 instances of loss of balance. Pt's SpO2 decreased to 87% during task requiring ~2 min seated rest break for return back to 91%. 2/4 DOE. Educated and provided pt with energy conservation handout. Continued education with pt on safety strategies and activity modifications for self-care and functional transfer tasks. Recommend home health OT for continued rehab following hospital discharge.   Follow Up Recommendations  Home health OT    Equipment Recommendations  3 in 1 bedside commode    Recommendations for Other Services      Precautions / Restrictions Precautions Precautions: Fall Restrictions Weight Bearing Restrictions: No       Mobility Bed Mobility Overal bed mobility: Modified Independent Bed Mobility: Supine to Sit     Supine to sit: HOB elevated;Modified independent (Device/Increase time)     General bed mobility comments: Use of bedrail, HOB elevated  Transfers Overall transfer level: Needs assistance Equipment used: None Transfers: Sit to/from Stand Sit to Stand: Supervision              Balance Overall balance assessment: Needs assistance   Sitting balance-Leahy Scale: Good       Standing balance-Leahy Scale: Fair                             ADL either performed or assessed with clinical judgement   ADL Overall ADL's : Needs assistance/impaired     Grooming: Wash/dry hands;Wash/dry face;Oral care;Supervision/safety;Standing   Upper Body Bathing: Supervision/ safety;Standing   Lower Body Bathing: Supervison/ safety;Sit to/from stand           Toilet Transfer: Supervision/safety;Grab Haematologist;Ambulation   Toileting- Clothing Manipulation and Hygiene: Supervision/safety;Sit to/from stand       Functional mobility during ADLs: Supervision/safety General ADL Comments: Pt able to ambulate to/from bathroom without an assistive device. Noted 0 instances of LOB.      Vision       Perception     Praxis      Cognition Arousal/Alertness: Awake/alert Behavior During Therapy: WFL for tasks assessed/performed Overall Cognitive Status: No family/caregiver present to determine baseline cognitive functioning                                          Exercises     Shoulder Instructions       General Comments Pt ambulated to/from bathroom  without an assistive device. Pt tolerated standing 1 x 9 min at the sink to complete grooming/hygiene tasks. SpO2 decreased to 87% on room air with ~2 min seated rest break for return back to 90s. Provided pt with handout regarding energy conservation.     Pertinent Vitals/ Pain       Pain Assessment: No/denies pain  Home Living                                          Prior Functioning/Environment              Frequency           Progress  Toward Goals  OT Goals(current goals can now be found in the care plan section)  Progress towards OT goals: Progressing toward goals  ADL Goals Pt Will Perform Lower Body Bathing: with modified independence;sit to/from stand Pt Will Perform Lower Body Dressing: with modified independence;sit to/from stand Pt Will Transfer to Toilet: with modified independence;ambulating Pt Will Perform Toileting - Clothing Manipulation and hygiene: with modified independence;sit to/from stand Additional ADL Goal #1: Pt will independently verbalize 3 energy conservation techniques  Plan Discharge plan remains appropriate    Co-evaluation                 AM-PAC OT "6 Clicks" Daily Activity     Outcome Measure   Help from another person eating meals?: None Help from another person taking care of personal grooming?: A Little Help from another person toileting, which includes using toliet, bedpan, or urinal?: A Little Help from another person bathing (including washing, rinsing, drying)?: A Little Help from another person to put on and taking off regular upper body clothing?: A Little Help from another person to put on and taking off regular lower body clothing?: A Little 6 Click Score: 19    End of Session    OT Visit Diagnosis: Muscle weakness (generalized) (M62.81);Unsteadiness on feet (R26.81)   Activity Tolerance Patient tolerated treatment well   Patient Left in bed;with call bell/phone within reach   Nurse Communication Mobility status        Time: 1350-1415 OT Time Calculation (min): 25 min  Charges: OT General Charges $OT Visit: 1 Visit OT Treatments $Self Care/Home Management : 8-22 mins $Therapeutic Activity: 8-22 mins  Mauri Brooklyn OTR/L [phone]    Mauri Brooklyn 02/22/2019, 4:01 PM

## 2019-02-22 NOTE — Care Management Important Message (Signed)
Important Message  Patient Details  Name: Heather [name] MRN: [id number] Date of Birth: [birth year]   Medicare Important Message Given:  Yes - Important Message mailed due to current National Emergency  Verbal consent obtained due to current National Emergency  Relationship to patient: Other Realative (must comment) Contact Name: Tomi Bamberger Call Date: 02/22/19  Time: 1507 Phone: [phone] Outcome: No Answer/Busy Important Message mailed to: Patient address on file    Delorse Lek 02/22/2019, 3:07 PM

## 2019-02-22 NOTE — Progress Notes (Signed)
   02/22/19 1206  Family/Significant Other Communication  Family/Significant Other Update Other (Comment) (Family states she is updating family.)

## 2019-02-22 NOTE — Plan of Care (Signed)
Patient is currently on room air, vitals signs stable, glucose level is still elevated at 284 when check last night and no acute events overnight.

## 2019-02-23 LAB — GLUCOSE, CAPILLARY
Glucose-Capillary: 211 mg/dL — ABNORMAL HIGH (ref 70–99)
Glucose-Capillary: 105 mg/dL — ABNORMAL HIGH (ref 70–99)
Glucose-Capillary: 155 mg/dL — ABNORMAL HIGH (ref 70–99)
Glucose-Capillary: 242 mg/dL — ABNORMAL HIGH (ref 70–99)

## 2019-02-23 NOTE — Progress Notes (Signed)
   02/23/19 1658  Patient Belongings/Medications Returned  Patient belongings from bedside/safe/pharmacy returned  Yes  Valuables returned to? [employer] valuables returned cloting, cell phone, Games developer, slippers, wallet  Discharge instructions given to patient. Discharge instructions provided. Patient verbalized understanding.

## 2019-02-23 NOTE — Progress Notes (Signed)
   02/23/19 1236  Family/Significant Other Communication  Family/Significant Other Update Other (Comment) (Patient declined)

## 2019-02-23 NOTE — TOC Progression Note (Signed)
Transition of Care [facility]) - Progression Note    Patient Details  Name: Heather [name] MRN: [id number] Date of Birth: [birth year]  [facility] CM/SW Contact  Joaquin Courts, RN Phone Number: 02/23/2019, 3:20 PM  Clinical Narrative:    CM confirmed delivery of all equipment and oxygen to transitional housing facility. Patient will discharge by PTAR.     Expected Discharge Plan: (uncertain at this time) Barriers to Discharge: Continued Medical Work up  Expected Discharge Plan and Services Expected Discharge Plan: (uncertain at this time)   Discharge Planning Services: CM Consult   Living arrangements for the past 2 months: Homeless Shelter Expected Discharge Date: 02/22/19                                     Social Determinants of Health (SDOH) Interventions    Readmission Risk Interventions No flowsheet data found.

## 2019-02-23 NOTE — Progress Notes (Signed)
Patient vital signs are stable, no acute events overnight.

## 2019-02-23 NOTE — Progress Notes (Signed)
Patient could not be discharged yesterday as her shelter wanted repeat COVID-19 test.  It was communicated to them that the test may not turn negative so quickly.  Social worker was trying to get an exemption for this patient.  Surprisingly the test did come back negative.  However we should still continue with 3 weeks of quarantine as this could be a false negative.  Otherwise patient is stable.  She denies any complaints.  Home oxygen to be arranged.  Please review discharge summary from yesterday for further details.  Bonnielee Haff 02/23/2019

## 2019-02-27 DIAGNOSIS — U071 COVID-19: Secondary | ICD-10-CM | POA: Diagnosis not present

## 2019-02-27 DIAGNOSIS — Z794 Long term (current) use of insulin: Secondary | ICD-10-CM | POA: Diagnosis not present

## 2019-02-27 DIAGNOSIS — E785 Hyperlipidemia, unspecified: Secondary | ICD-10-CM | POA: Diagnosis not present

## 2019-02-27 DIAGNOSIS — I2510 Atherosclerotic heart disease of native coronary artery without angina pectoris: Secondary | ICD-10-CM | POA: Diagnosis not present

## 2019-02-27 DIAGNOSIS — I10 Essential (primary) hypertension: Secondary | ICD-10-CM | POA: Diagnosis not present

## 2019-02-27 DIAGNOSIS — J1282 Pneumonia due to coronavirus disease 2019: Secondary | ICD-10-CM | POA: Diagnosis not present

## 2019-02-27 DIAGNOSIS — K219 Gastro-esophageal reflux disease without esophagitis: Secondary | ICD-10-CM | POA: Diagnosis not present

## 2019-02-27 DIAGNOSIS — E1140 Type 2 diabetes mellitus with diabetic neuropathy, unspecified: Secondary | ICD-10-CM | POA: Diagnosis not present

## 2019-02-27 DIAGNOSIS — Z9181 History of falling: Secondary | ICD-10-CM | POA: Diagnosis not present

## 2019-02-27 DIAGNOSIS — Z8781 Personal history of (healed) traumatic fracture: Secondary | ICD-10-CM | POA: Diagnosis not present

## 2019-03-18 ENCOUNTER — Telehealth: Payer: Self-pay | Admitting: Student in an Organized Health Care Education/Training Program

## 2019-03-18 ENCOUNTER — Telehealth: Payer: Self-pay

## 2019-03-18 NOTE — Telephone Encounter (Signed)
Opened in error

## 2019-03-18 NOTE — Telephone Encounter (Signed)
Tilford Pillar (PT) from [facility] calls nurse line about constipation issues patient has been having. Per PT, patient will go weeks without having a good bowel movement. PT asking if she can receive orders for patient to have fecal disimpaction with glycerine fleet enema with milk of mag and then followed with daily miralax order.   Please advise  To PCP  Talbot Grumbling, RN

## 2019-03-22 NOTE — Telephone Encounter (Signed)
Please schedule patient for appointment concerning this issue (can be virtual).  Thank you

## 2019-03-23 NOTE — Telephone Encounter (Signed)
Pt scheduled for virtual visit to discuss issues. Per patient she was able to get some relief by taking miralax.   Talbot Grumbling, RN

## 2019-03-28 ENCOUNTER — Other Ambulatory Visit: Payer: Self-pay | Admitting: Student in an Organized Health Care Education/Training Program

## 2019-03-28 DIAGNOSIS — E785 Hyperlipidemia, unspecified: Secondary | ICD-10-CM

## 2019-03-28 DIAGNOSIS — E1169 Type 2 diabetes mellitus with other specified complication: Secondary | ICD-10-CM

## 2019-03-31 ENCOUNTER — Telehealth (INDEPENDENT_AMBULATORY_CARE_PROVIDER_SITE_OTHER): Payer: Self-pay | Admitting: Student in an Organized Health Care Education/Training Program

## 2019-03-31 ENCOUNTER — Other Ambulatory Visit: Payer: Self-pay

## 2019-03-31 DIAGNOSIS — J069 Acute upper respiratory infection, unspecified: Secondary | ICD-10-CM

## 2019-03-31 DIAGNOSIS — E1165 Type 2 diabetes mellitus with hyperglycemia: Secondary | ICD-10-CM

## 2019-03-31 DIAGNOSIS — E1140 Type 2 diabetes mellitus with diabetic neuropathy, unspecified: Secondary | ICD-10-CM

## 2019-03-31 DIAGNOSIS — E782 Mixed hyperlipidemia: Secondary | ICD-10-CM

## 2019-03-31 DIAGNOSIS — K5900 Constipation, unspecified: Secondary | ICD-10-CM | POA: Insufficient documentation

## 2019-03-31 DIAGNOSIS — IMO0002 Reserved for concepts with insufficient information to code with codable children: Secondary | ICD-10-CM

## 2019-03-31 DIAGNOSIS — Z794 Long term (current) use of insulin: Secondary | ICD-10-CM

## 2019-03-31 DIAGNOSIS — K5901 Slow transit constipation: Secondary | ICD-10-CM

## 2019-03-31 NOTE — Assessment & Plan Note (Signed)
Scheduled for follow up appointment in ~2 weeks to assess

## 2019-03-31 NOTE — Progress Notes (Signed)
[facility] Telemedicine Visit  Patient consented to have virtual visit. Method of visit: Telephone  Encounter participants: Patient: Heather [name] - located at home Provider: Richarda Osmond - located at [facility] Others (if applicable): none  Chief Complaint: None.  Appointment originally scheduled for constipation due to prompting by patient's physical therapist but patient denies having any issues with constipation currently.  HPI:  Recent hosp admission for acute respiratory failure and Covid infection-she was discharged in stable condition with home oxygen but she has not used the home oxygen nor has she felt like she needed it.  Denies any chest pain, shortness of breath, cough, dyspnea.  She feels really good right now.   Diabetes-patient had elevated blood sugars in the hospital and directly after discharge which I explained to her was likely due to the steroids she received.  She is now eating a lot healthier and states that she really loves eating fruits and vegetables and has been staying away from sweet foods.  She checks her fasting blood sugars every morning and has noticed that they have been a lot lower now that she has been eating better.  One morning her blood sugar was even as low as 78 but she was asymptomatic.  She has not experienced any lightheadedness, nausea, vomiting, polydipsia, polyuria.  She remains adherent with her blood sugar medications.  I know that she has been wanting to decrease her need for medications in the past so we will have another appointment  Constipation-patient states that she had almost a week with no bowel movement but had tried over-the-counter solutions such as suppository and MiraLAX which gave her good relief and she has had regular bowel movements without any GI upset since then.  She has noticed since she started eating healthier that she has had increased belching.  This is not bothering her to the point that she would  like some medication to treat at this time but will let me know if that changes.  We discussed that her diet being more full of fiber now can have that effect.   ROS: per HPI  Pertinent PMHx: recent hospitalization, IDDM, HTN  Exam:  Respiratory: speaking in full sentences, no dyspnea  Assessment/Plan:  Uncontrolled diabetes mellitus with diabetic neuropathy, with long-term current use of insulin ([facility] [place]) Scheduled for follow up appointment in ~2 weeks to assess  Acute respiratory disease due to COVID-19 virus resolved  HLD (hyperlipidemia) Recheck lipid panel at next appointment  Constipated Resolved. Residual increased gas 2/2 dietary changes. Patient declined tx as it is not bothersome currently    Time spent during visit with patient: 13 minutes

## 2019-03-31 NOTE — Assessment & Plan Note (Signed)
resolved 

## 2019-03-31 NOTE — Assessment & Plan Note (Signed)
Recheck lipid panel at next appointment

## 2019-03-31 NOTE — Assessment & Plan Note (Signed)
Resolved. Residual increased gas 2/2 dietary changes. Patient declined tx as it is not bothersome currently

## 2019-04-12 ENCOUNTER — Ambulatory Visit (INDEPENDENT_AMBULATORY_CARE_PROVIDER_SITE_OTHER): Payer: Medicare HMO | Admitting: Student in an Organized Health Care Education/Training Program

## 2019-04-12 ENCOUNTER — Encounter: Payer: Self-pay | Admitting: Student in an Organized Health Care Education/Training Program

## 2019-04-12 ENCOUNTER — Other Ambulatory Visit: Payer: Self-pay

## 2019-04-12 VITALS — BP 110/64 | HR 71 | Ht 67.0 in | Wt 195.0 lb

## 2019-04-12 DIAGNOSIS — R69 Illness, unspecified: Secondary | ICD-10-CM | POA: Diagnosis not present

## 2019-04-12 DIAGNOSIS — Z794 Long term (current) use of insulin: Secondary | ICD-10-CM | POA: Diagnosis not present

## 2019-04-12 DIAGNOSIS — E1165 Type 2 diabetes mellitus with hyperglycemia: Secondary | ICD-10-CM

## 2019-04-12 DIAGNOSIS — E782 Mixed hyperlipidemia: Secondary | ICD-10-CM | POA: Diagnosis not present

## 2019-04-12 DIAGNOSIS — E1159 Type 2 diabetes mellitus with other circulatory complications: Secondary | ICD-10-CM

## 2019-04-12 DIAGNOSIS — F3289 Other specified depressive episodes: Secondary | ICD-10-CM | POA: Diagnosis not present

## 2019-04-12 DIAGNOSIS — I10 Essential (primary) hypertension: Secondary | ICD-10-CM

## 2019-04-12 DIAGNOSIS — I152 Hypertension secondary to endocrine disorders: Secondary | ICD-10-CM

## 2019-04-12 DIAGNOSIS — R1013 Epigastric pain: Secondary | ICD-10-CM

## 2019-04-12 DIAGNOSIS — E1140 Type 2 diabetes mellitus with diabetic neuropathy, unspecified: Secondary | ICD-10-CM | POA: Diagnosis not present

## 2019-04-12 DIAGNOSIS — IMO0002 Reserved for concepts with insufficient information to code with codable children: Secondary | ICD-10-CM

## 2019-04-12 MED ORDER — SUCRALFATE 1 GM/10ML PO SUSP: 1.0000 g | Freq: Three times a day (TID) | ORAL | 0 refills | Status: DC | PRN

## 2019-04-12 NOTE — Progress Notes (Signed)
   CHIEF COMPLAINT / HPI: f/u chronic disease  Overall, patient is doing very well. Has no complaints of fever, cough, SOB, chest pain.  Depression- patient has some good and bad days but mostly good. She is starting back at work soon which is a huge uplifting event for the patient.   Diabetes- taking 32u insulin every morning. Blood sugars were in 200s when first discharged (had steroids) and have now been in low 100s and once as low as 68. Patient was completely asymptomatic and was able to eat to increase blood sugars. Was not able to tolerate victoza due to GI upset. Has been eating a healthier diet with more fruits and vegetables.   Epigastric pain- pain since discharge in epigastric area. Intermittent and not associated with particular activity or at particular time of day. Does not know what exacerbates it or alleviates it. Occurs approximately every other day and lasts up to 15 minutes before subsiding spontaneously. Associated with increased gas, particularly burping which relieves the pain.  PERTINENT  PMH / PSH: GERD, neuropathy, constipation, DMII, HLD   OBJECTIVE: BP 110/64   Pulse 71   Ht 5\' 7"  (1.702 m)   Wt 195 lb (88.5 kg)   SpO2 (!) 83%   BMI 30.54 kg/m   General: NAD, pleasant, able to participate in exam Cardiac: RRR, normal heart sounds, no murmurs. 2+ radial and PT pulses bilaterally Respiratory: CTAB, normal effort, No wheezes, rales or rhonchi Abdomen: soft, nontender anywhere (no epigastric or LUQ tenderness), nondistended, no hepatic or splenomegaly, +BS Extremities: no edema or cyanosis. WWP. Skin: warm and dry, no rashes noted Neuro: alert and oriented x4, no focal deficits Psych: Normal affect and mood   ASSESSMENT / PLAN:  Uncontrolled diabetes mellitus with diabetic neuropathy, with long-term current use of insulin ([facility]) Recent A1c was 9.4 during hospitalization for acute illness requiring steroids which is not at goal. Blood sugars have been much  lower since discharge so will not change insulin at this time.  Patient not able to tolerate victoza due to GI distress  Depression Well controlled  HLD (hyperlipidemia) Repeat lipid panel today and will consider increasing statin dose  Epigastric pain presumed as indigestion given history and physical.  Will reserve mild suspicion for atypical angina and workup if not improved with treatment Prescribed sucralfate.      [facility]

## 2019-04-12 NOTE — Assessment & Plan Note (Addendum)
Recent A1c was 9.4 during hospitalization for acute illness requiring steroids which is not at goal. Blood sugars have been much lower since discharge so will not change insulin at this time.  Patient not able to tolerate victoza due to GI distress

## 2019-04-12 NOTE — Patient Instructions (Signed)
It was a pleasure to see you today!  To summarize our discussion for this visit:  I'm so happy you're recovering and have good news of returning to work soon.   Today we will start a medication to help with reflux/GERD symptoms. If not improving, please let me know.  We're checking cholesterol today  Can we follow up in about a month to check in on how you're doing with everything?  Some additional health maintenance measures we should update are: Health Maintenance Due  Topic Date Due  . INFLUENZA VACCINE  09/26/2018  . FOOT EXAM  04/07/2019  .    Call the clinic at [phone] if your symptoms worsen or you have any concerns.   Thank you for allowing me to take part in your care,  Dr. Doristine Mango   Gastroesophageal Reflux Disease, Adult Gastroesophageal reflux (GER) happens when acid from the stomach flows up into the tube that connects the mouth and the stomach (esophagus). Normally, food travels down the esophagus and stays in the stomach to be digested. With GER, food and stomach acid sometimes move back up into the esophagus. You may have a disease called gastroesophageal reflux disease (GERD) if the reflux:  Happens often.  Causes frequent or very bad symptoms.  Causes problems such as damage to the esophagus. When this happens, the esophagus becomes sore and swollen (inflamed). Over time, GERD can make small holes (ulcers) in the lining of the esophagus. What are the causes? This condition is caused by a problem with the muscle between the esophagus and the stomach. When this muscle is weak or not normal, it does not close properly to keep food and acid from coming back up from the stomach. The muscle can be weak because of:  Tobacco use.  Pregnancy.  Having a certain type of hernia (hiatal hernia).  Alcohol use.  Certain foods and drinks, such as coffee, chocolate, onions, and peppermint. What increases the risk? You are more likely to develop this  condition if you:  Are overweight.  Have a disease that affects your connective tissue.  Use NSAID medicines. What are the signs or symptoms? Symptoms of this condition include:  Heartburn.  Difficult or painful swallowing.  The feeling of having a lump in the throat.  A bitter taste in the mouth.  Bad breath.  Having a lot of saliva.  Having an upset or bloated stomach.  Belching.  Chest pain. Different conditions can cause chest pain. Make sure you see your doctor if you have chest pain.  Shortness of breath or noisy breathing (wheezing).  Ongoing (chronic) cough or a cough at night.  Wearing away of the surface of teeth (tooth enamel).  Weight loss. How is this treated? Treatment will depend on how bad your symptoms are. Your doctor may suggest:  Changes to your diet.  Medicine.  Surgery. Follow these instructions at home: Eating and drinking   Follow a diet as told by your doctor. You may need to avoid foods and drinks such as: ? Coffee and tea (with or without caffeine). ? Drinks that contain alcohol. ? Energy drinks and sports drinks. ? Bubbly (carbonated) drinks or sodas. ? Chocolate and cocoa. ? Peppermint and mint flavorings. ? Garlic and onions. ? Horseradish. ? Spicy and acidic foods. These include peppers, chili powder, curry powder, vinegar, hot sauces, and BBQ sauce. ? Citrus fruit juices and citrus fruits, such as oranges, lemons, and limes. ? Tomato-based foods. These include red sauce, chili, salsa, and  pizza with red sauce. ? Fried and fatty foods. These include donuts, french fries, potato chips, and high-fat dressings. ? High-fat meats. These include hot dogs, rib eye steak, sausage, ham, and bacon. ? High-fat dairy items, such as whole milk, butter, and cream cheese.  Eat small meals often. Avoid eating large meals.  Avoid drinking large amounts of liquid with your meals.  Avoid eating meals during the 2-3 hours before  bedtime.  Avoid lying down right after you eat.  Do not exercise right after you eat. Lifestyle   Do not use any products that contain nicotine or tobacco. These include cigarettes, e-cigarettes, and chewing tobacco. If you need help quitting, ask your doctor.  Try to lower your stress. If you need help doing this, ask your doctor.  If you are overweight, lose an amount of weight that is healthy for you. Ask your doctor about a safe weight loss goal. General instructions  Pay attention to any changes in your symptoms.  Take over-the-counter and prescription medicines only as told by your doctor. Do not take aspirin, ibuprofen, or other NSAIDs unless your doctor says it is okay.  Wear loose clothes. Do not wear anything tight around your waist.  Raise (elevate) the head of your bed about 6 inches (15 cm).  Avoid bending over if this makes your symptoms worse.  Keep all follow-up visits as told by your doctor. This is important. Contact a doctor if:  You have new symptoms.  You lose weight and you do not know why.  You have trouble swallowing or it hurts to swallow.  You have wheezing or a cough that keeps happening.  Your symptoms do not get better with treatment.  You have a hoarse voice. Get help right away if:  You have pain in your arms, neck, jaw, teeth, or back.  You feel sweaty, dizzy, or light-headed.  You have chest pain or shortness of breath.  You throw up (vomit) and your throw-up looks like blood or coffee grounds.  You pass out (faint).  Your poop (stool) is bloody or black.  You cannot swallow, drink, or eat. Summary  If a person has gastroesophageal reflux disease (GERD), food and stomach acid move back up into the esophagus and cause symptoms or problems such as damage to the esophagus.  Treatment will depend on how bad your symptoms are.  Follow a diet as told by your doctor.  Take all medicines only as told by your doctor. This  information is not intended to replace advice given to you by your health care provider. Make sure you discuss any questions you have with your health care provider. Document Revised: 08/20/2017 Document Reviewed: 08/20/2017 Elsevier Patient Education  [place].

## 2019-04-13 LAB — LIPID PANEL
Chol/HDL Ratio: 2.3 ratio (ref 0.0–4.4)
Cholesterol, Total: 150 mg/dL (ref 100–199)
HDL: 65 mg/dL (ref >39)
LDL Chol Calc (NIH): 67 mg/dL (ref 0–99)
Triglycerides: 97 mg/dL (ref 0–149)
VLDL Cholesterol Cal: 18 mg/dL (ref 5–40)

## 2019-04-14 ENCOUNTER — Other Ambulatory Visit: Payer: Self-pay

## 2019-04-14 ENCOUNTER — Ambulatory Visit (INDEPENDENT_AMBULATORY_CARE_PROVIDER_SITE_OTHER): Payer: Medicare HMO

## 2019-04-14 ENCOUNTER — Ambulatory Visit: Payer: Medicare HMO

## 2019-04-14 DIAGNOSIS — R6889 Other general symptoms and signs: Secondary | ICD-10-CM

## 2019-04-14 DIAGNOSIS — F32A Depression, unspecified: Secondary | ICD-10-CM | POA: Insufficient documentation

## 2019-04-14 DIAGNOSIS — F329 Major depressive disorder, single episode, unspecified: Secondary | ICD-10-CM | POA: Insufficient documentation

## 2019-04-14 DIAGNOSIS — R1013 Epigastric pain: Secondary | ICD-10-CM | POA: Insufficient documentation

## 2019-04-14 NOTE — Assessment & Plan Note (Signed)
presumed as indigestion given history and physical.  Will reserve mild suspicion for atypical angina and workup if not improved with treatment Prescribed sucralfate.

## 2019-04-14 NOTE — Assessment & Plan Note (Signed)
Repeat lipid panel today and will consider increasing statin dose

## 2019-04-14 NOTE — Progress Notes (Signed)
Patient presents to nurse clinic for follow up vital signs.   -BP" 116/74 -O2: 97% Room Air -HR: 90 -RR: 18  Patient reports that she has noticed decreased oxygen saturations when wearing mask. Patient noted to have lower oxygen saturation into low 90s when at first entering room. After observing for a few minutes and instructing patient to take a few deep breaths, 02 saturations were within normal limits.   To PCP  Please advise if patient needs further follow up.   Talbot Grumbling, RN

## 2019-04-14 NOTE — Assessment & Plan Note (Signed)
Well controlled 

## 2019-04-21 ENCOUNTER — Encounter: Payer: Self-pay | Admitting: Student in an Organized Health Care Education/Training Program

## 2019-04-25 DIAGNOSIS — U071 COVID-19: Secondary | ICD-10-CM | POA: Diagnosis not present

## 2019-05-06 ENCOUNTER — Other Ambulatory Visit: Payer: Self-pay | Admitting: Student in an Organized Health Care Education/Training Program

## 2019-05-24 DIAGNOSIS — U071 COVID-19: Secondary | ICD-10-CM | POA: Diagnosis not present

## 2019-06-02 ENCOUNTER — Ambulatory Visit (INDEPENDENT_AMBULATORY_CARE_PROVIDER_SITE_OTHER): Payer: Medicare HMO

## 2019-06-02 ENCOUNTER — Other Ambulatory Visit: Payer: Self-pay

## 2019-06-02 DIAGNOSIS — Z0000 Encounter for general adult medical examination without abnormal findings: Secondary | ICD-10-CM
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
## 2019-06-04 NOTE — Patient Instructions (Addendum)
You spoke to Heather [name], [place] over the phone for your annual wellness visit.  We discussed goals: Goals    . Exercise 3x per week (30 min per time)     Encouraged walking with the nicer weather 3x per week for 92mns each time.    . Patient Stated     Wants to get stable on her medications with her chronic problems of HTN, DM, HLD.      We also discussed recommended health maintenance. As discussed, you are pretty much up to date with everything! Talk with PCP about PNA vaccine at next visit.   Health Maintenance  Topic Date Due  . FOOT EXAM  04/07/2019  . PNA vac Low Risk Adult (2 of 2 - PCV13) 04/14/2019  . HEMOGLOBIN A1C  08/13/2019  . INFLUENZA VACCINE  09/26/2019  . OPHTHALMOLOGY EXAM  10/26/2019  . MAMMOGRAM  09/21/2020  . TETANUS/TDAP  02/15/2025  . COLONOSCOPY  02/25/2025  . DEXA SCAN  Completed  . Hepatitis C Screening  Completed   Ask for an advance directive packet at next office visit.  PCP visit scheduled for May 3rd @ 9:10am. Start walking outside with the nicer weather!    Preventive Care 668Years and Older, Female Preventive care refers to lifestyle choices and visits with your health care provider that can promote health and wellness. This includes:  A yearly physical exam. This is also called an annual well check.  Regular dental and eye exams.  Immunizations.  Screening for certain conditions.  Healthy lifestyle choices, such as diet and exercise. What can I expect for my preventive care visit? Physical exam Your health care provider will check:  Height and weight. These may be used to calculate body mass index (BMI), which is a measurement that tells if you are at a healthy weight.  Heart rate and blood pressure.  Your skin for abnormal spots. Counseling Your health care provider may ask you questions about:  Alcohol, tobacco, and drug use.  Emotional well-being.  Home and relationship well-being.  Sexual activity.  Eating  habits.  History of falls.  Memory and ability to understand (cognition).  Work and work eStatistician  Pregnancy and menstrual history. What immunizations do I need?  Influenza (flu) vaccine  This is recommended every year. Tetanus, diphtheria, and pertussis (Tdap) vaccine  You may need a Td booster every 10 years. Varicella (chickenpox) vaccine  You may need this vaccine if you have not already been vaccinated. Zoster (shingles) vaccine  You may need this after age 69 Pneumococcal conjugate (PCV13) vaccine  One dose is recommended after age 69 Pneumococcal polysaccharide (PPSV23) vaccine  One dose is recommended after age 69 Measles, mumps, and rubella (MMR) vaccine  You may need at least one dose of MMR if you were born in 1957 or later. You may also need a second dose. Meningococcal conjugate (MenACWY) vaccine  You may need this if you have certain conditions. Hepatitis A vaccine  You may need this if you have certain conditions or if you travel or work in places where you may be exposed to hepatitis A. Hepatitis B vaccine  You may need this if you have certain conditions or if you travel or work in places where you may be exposed to hepatitis B. Haemophilus influenzae type b (Hib) vaccine  You may need this if you have certain conditions. You may receive vaccines as individual doses or as more than one vaccine together in one shot (combination vaccines).  Talk with your health care provider about the risks and benefits of combination vaccines. What tests do I need? Blood tests  Lipid and cholesterol levels. These may be checked every 5 years, or more frequently depending on your overall health.  Hepatitis C test.  Hepatitis B test. Screening  Lung cancer screening. You may have this screening every year starting at age 17 if you have a 30-pack-year history of smoking and currently smoke or have quit within the past 15 years.  Colorectal cancer screening.  All adults should have this screening starting at age 42 and continuing until age 84. Your health care provider may recommend screening at age 39 if you are at increased risk. You will have tests every 1-10 years, depending on your results and the type of screening test.  Diabetes screening. This is done by checking your blood sugar (glucose) after you have not eaten for a while (fasting). You may have this done every 1-3 years.  Mammogram. This may be done every 1-2 years. Talk with your health care provider about how often you should have regular mammograms.  BRCA-related cancer screening. This may be done if you have a family history of breast, ovarian, tubal, or peritoneal cancers. Other tests  Sexually transmitted disease (STD) testing.  Bone density scan. This is done to screen for osteoporosis. You may have this done starting at age 50. Follow these instructions at home: Eating and drinking  Eat a diet that includes fresh fruits and vegetables, whole grains, lean protein, and low-fat dairy products. Limit your intake of foods with high amounts of sugar, saturated fats, and salt.  Take vitamin and mineral supplements as recommended by your health care provider.  Do not drink alcohol if your health care provider tells you not to drink.  If you drink alcohol: ? Limit how much you have to 0-1 drink a day. ? Be aware of how much alcohol is in your drink. In the [place] one drink equals one 12 oz bottle of beer (355 mL), one 5 oz glass of wine (148 mL), or one 1 oz glass of hard liquor (44 mL). Lifestyle  Take daily care of your teeth and gums.  Stay active. Exercise for at least 30 minutes on 5 or more days each week.  Do not use any products that contain nicotine or tobacco, such as cigarettes, e-cigarettes, and chewing tobacco. If you need help quitting, ask your health care provider.  If you are sexually active, practice safe sex. Use a condom or other form of protection in order  to prevent STIs (sexually transmitted infections).  Talk with your health care provider about taking a low-dose aspirin or statin. What's next?  Go to your health care provider once a year for a well check visit.  Ask your health care provider how often you should have your eyes and teeth checked.  Stay up to date on all vaccines. This information is not intended to replace advice given to you by your health care provider. Make sure you discuss any questions you have with your health care provider. Document Revised: 02/05/2018 Document Reviewed: 02/05/2018 Elsevier Patient Education  2020 [facility]'s number is [phone]. Please call with questions or concerns about what we discussed today.

## 2019-06-28 ENCOUNTER — Other Ambulatory Visit: Payer: Self-pay | Admitting: Student in an Organized Health Care Education/Training Program

## 2019-06-28 ENCOUNTER — Other Ambulatory Visit: Payer: Self-pay

## 2019-06-28 ENCOUNTER — Ambulatory Visit (INDEPENDENT_AMBULATORY_CARE_PROVIDER_SITE_OTHER): Payer: Medicare HMO | Admitting: Student in an Organized Health Care Education/Training Program

## 2019-06-28 ENCOUNTER — Encounter: Payer: Self-pay | Admitting: Student in an Organized Health Care Education/Training Program

## 2019-06-28 VITALS — BP 128/74 | HR 71 | Ht 67.0 in | Wt 200.8 lb

## 2019-06-28 DIAGNOSIS — I10 Essential (primary) hypertension: Secondary | ICD-10-CM | POA: Diagnosis not present

## 2019-06-28 DIAGNOSIS — F3289 Other specified depressive episodes: Secondary | ICD-10-CM | POA: Diagnosis not present

## 2019-06-28 DIAGNOSIS — Z794 Long term (current) use of insulin: Secondary | ICD-10-CM | POA: Diagnosis not present

## 2019-06-28 DIAGNOSIS — R69 Illness, unspecified: Secondary | ICD-10-CM | POA: Diagnosis not present

## 2019-06-28 DIAGNOSIS — E1165 Type 2 diabetes mellitus with hyperglycemia: Secondary | ICD-10-CM | POA: Diagnosis not present

## 2019-06-28 DIAGNOSIS — E1140 Type 2 diabetes mellitus with diabetic neuropathy, unspecified: Secondary | ICD-10-CM

## 2019-06-28 DIAGNOSIS — IMO0002 Reserved for concepts with insufficient information to code with codable children: Secondary | ICD-10-CM

## 2019-06-28 DIAGNOSIS — G629 Polyneuropathy, unspecified: Secondary | ICD-10-CM

## 2019-06-28 LAB — POCT GLYCOSYLATED HEMOGLOBIN (HGB A1C): HbA1c, POC (controlled diabetic range): 8.7 % — AB (ref 0.0–7.0)

## 2019-06-28 MED ORDER — TRESIBA FLEXTOUCH 200 UNIT/ML ~~LOC~~ SOPN: 32.0000 [IU] | PEN_INJECTOR | Freq: Every day | SUBCUTANEOUS | 3 refills | Status: DC

## 2019-06-28 NOTE — Assessment & Plan Note (Addendum)
A1c 8.7 which is improved from last reading which was likely elevated from steroid use.  Patient admits to poor diet and activity and non-adherence with medication - performed foot exam  - Refilled tresiba at 32u - set exercise goals of walking for 20 minutes at least 1x/week - set diet goal of making a healthier choice while eating out at least 1x/week - recheck in 3 months

## 2019-06-28 NOTE — Patient Instructions (Signed)
It was a pleasure to see you today!  To summarize our discussion for this visit:  Goal:  One time a week at least walking for at least 20 minutes.   When going out to eat, try to make better choices. Even once a week make another choice that can benefit your healthy lifestyle goals.   Some additional health maintenance measures we should update are: Health Maintenance Due  Topic Date Due  . COVID-19 Vaccine (1) Never done  . FOOT EXAM  04/07/2019  . PNA vac Low Risk Adult (2 of 2 - PCV13) 04/14/2019  .    Please return to our clinic to see me in about 2 months.  Call the clinic at [phone] if your symptoms worsen or you have any concerns.   Thank you for allowing me to take part in your care,  Dr. Doristine Mango  10 LITTLE Things To Do When You're Feeling Too Down To Do Anything  Take a shower. Even if you plan to stay in all day long and not see a soul, take a shower. It takes the most effort to hop in to the shower but once you do, you'll feel immediate results. It will wake you up and you'll be feeling much fresher (and cleaner too).  Brush and floss your teeth. Give your teeth a good brushing with a floss finish. It's a small task but it feels so good and you can check 'taking care of your health' off the list of things to do.  Do something small on your list. Most of [place] have some small thing we would like to get done (load of laundry, sew a button, email a friend). Doing one of these things will make you feel like you've accomplished something.  Drink water. Drinking water is easy right? It's also really beneficial for your health so keep a glass beside you all day and take sips often. It gives you energy and prevents you from boredom eating.  Do some floor exercises. The last thing you want to do is exercise but it might be just the thing you need the most. Keep it simple and do exercises that involve sitting or laying on the floor. Even the smallest of exercises  release chemicals in the brain that make you feel good. Yoga stretches or core exercises are going to make you feel good with minimal effort.  Make your bed. Making your bed takes a few minutes but it's productive and you'll feel relieved when it's done. An unmade bed is a huge visual reminder that you're having an unproductive day. Do it and consider it your housework for the day.  Put on some nice clothes. Take the sweatpants off even if you don't plan to go anywhere. Put on clothes that make you feel good. Take a look in the mirror so your brain recognizes the sweatpants have been replaced with clothes that make you look great. It's an instant confidence booster.  Wash the dishes. A pile of dirty dishes in the sink is a reflection of your mood. It's possible that if you wash up the dishes, your mood will follow suit. It's worth a try.  Cook a real meal. If you have the luxury to have a "do nothing" day, you have time to make a real meal for yourself. Make a meal that you love to eat. The process is good to get you out of the funk and the food will ensure you have more energy for tomorrow.  Write out your thoughts by hand. When you hand write, you stimulate your brain to focus on the moment that you're in so make yourself comfortable and write whatever comes into your mind. Put those thoughts out on paper so they stop spinning around in your head. Those thoughts might be the very thing holding you down.   Exercises To Do While Sitting  Exercises that you do while sitting (chair exercises) can give you many of the same benefits as full exercise. Benefits include strengthening your heart, burning calories, and keeping muscles and joints healthy. Exercise can also improve your mood and help with depression and anxiety. You may benefit from chair exercises if you are unable to do standing exercises because of:  Diabetic foot pain.  Obesity.  Illness.  Arthritis.  Recovery from surgery or  injury.  Breathing problems.  Balance problems.  Another type of disability. Before starting chair exercises, check with your health care provider or a physical therapist to find out how much exercise you can tolerate and which exercises are safe for you. If your health care provider approves:  Start out slowly and build up over time. Aim to work up to about 10-20 minutes for each exercise session.  Make exercise part of your daily routine.  Drink water when you exercise. Do not wait until you are thirsty. Drink every 10-15 minutes.  Stop exercising right away if you have pain, nausea, shortness of breath, or dizziness.  If you are exercising in a wheelchair, make sure to lock the wheels.  Ask your health care provider whether you can do tai chi or yoga. Many positions in these mind-body exercises can be modified to do while seated. Warm-up Before starting other exercises: 1. Sit up as straight as you can. Have your knees bent at 90 degrees, which is the shape of the capital letter "L." Keep your feet flat on the floor. 2. Sit at the front edge of your chair, if you can. 3. Pull in (tighten) the muscles in your abdomen and stretch your spine and neck as straight as you can. Hold this position for a few minutes. 4. Breathe in and out evenly. Try to concentrate on your breathing, and relax your mind. Stretching Exercise A: Arm stretch 1. Hold your arms out straight in front of your body. 2. Bend your hands at the wrist with your fingers pointing up, as if signaling someone to stop. Notice the slight tension in your forearms as you hold the position. 3. Keeping your arms out and your hands bent, rotate your hands outward as far as you can and hold this stretch. Aim to have your thumbs pointing up and your pinkie fingers pointing down. Slowly repeat arm stretches for one minute as tolerated. Exercise B: Leg stretch 1. If you can move your legs, try to "draw" letters on the floor with the  toes of your foot. Write your name with one foot. 2. Write your name with the toes of your other foot. Slowly repeat the movements for one minute as tolerated. Exercise C: Reach for the sky 1. Reach your hands as far over your head as you can to stretch your spine. 2. Move your hands and arms as if you are climbing a rope. Slowly repeat the movements for one minute as tolerated. Range of motion exercises Exercise A: Shoulder roll 1. Let your arms hang loosely at your sides. 2. Lift just your shoulders up toward your ears, then let them relax back down. 3.  When your shoulders feel loose, rotate your shoulders in backward and forward circles. Do shoulder rolls slowly for one minute as tolerated. Exercise B: March in place 1. As if you are marching, pump your arms and lift your legs up and down. Lift your knees as high as you can. ? If you are unable to lift your knees, just pump your arms and move your ankles and feet up and down. March in place for one minute as tolerated. Exercise C: Seated jumping jacks 1. Let your arms hang down straight. 2. Keeping your arms straight, lift them up over your head. Aim to point your fingers to the ceiling. 3. While you lift your arms, straighten your legs and slide your heels along the floor to your sides, as wide as you can. 4. As you bring your arms back down to your sides, slide your legs back together. ? If you are unable to use your legs, just move your arms. Slowly repeat seated jumping jacks for one minute as tolerated. Strengthening exercises Exercise A: Shoulder squeeze 1. Hold your arms straight out from your body to your sides, with your elbows bent and your fists pointed at the ceiling. 2. Keeping your arms in the bent position, move them forward so your elbows and forearms meet in front of your face. 3. Open your arms back out as wide as you can with your elbows still bent, until you feel your shoulder blades squeezing together. Hold for 5  seconds. Slowly repeat the movements forward and backward for one minute as tolerated. Contact a health care provider if you:  Had to stop exercising due to any of the following: ? Pain. ? Nausea. ? Shortness of breath. ? Dizziness. ? Fatigue.  Have significant pain or soreness after exercising. Get help right away if you have:  Chest pain.  Difficulty breathing. These symptoms may represent a serious problem that is an emergency. Do not wait to see if the symptoms will go away. Get medical help right away. Call your local emergency services (911 in the [place] Do not drive yourself to the hospital. This information is not intended to replace advice given to you by your health care provider. Make sure you discuss any questions you have with your health care provider. Document Revised: 06/04/2018 Document Reviewed: 12/25/2016 Elsevier Patient Education  2020 [employer].

## 2019-06-28 NOTE — Progress Notes (Signed)
    SUBJECTIVE:   CHIEF COMPLAINT / HPI: diabetes check  Diabetes: Has been without [place] for 2 weeks.  Blood sugars at home have been ranging in the 150s to 160s range without it.  Endorses worsening lower extremity neuropathies.  Denies polyuria, polydipsia.  Admits to having poor diet due to living situation where she cannot cook so she goes out to eat for most meals and does not make healthy choices.  Does not do any exercising at this time.  Depression: Patient's PHQ 9 score of 16 with a 0 for question 9.  Patient declines medication or counseling at this time.  She states that it is mostly associated with feeling useless.  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  While she is working she feels really happy and useful and denies any depressive symptoms during these periods.  Most of her down mood comes from when she is living in the homeless shelter without any activities.  PERTINENT  PMH / PSH: DM  OBJECTIVE:   BP 128/74   Pulse 71   Ht 5\' 7"  (1.702 m)   Wt 200 lb 12.8 oz (91.1 kg)   SpO2 99%   BMI 31.45 kg/m   General: NAD, pleasant, able to participate in exam Cardiac: RRR, normal heart sounds, no murmurs. 2+ radial and PT pulses bilaterally Respiratory: CTAB, normal effort, No wheezes, rales or rhonchi Abdomen: soft, nontender, nondistended, no hepatic or splenomegaly, +BS Extremities: no edema or cyanosis. WWP. Skin: warm and dry, no rashes noted Neuro: alert and oriented x4, no focal deficits Psych: Normal affect and mood  Diabetic Foot Exam - Simple   Simple Foot Form Diabetic Foot exam was performed with the following findings: Yes 06/28/2019 10:08 AM  Visual Inspection No deformities, no ulcerations, no other skin breakdown bilaterally: Yes Sensation Testing Intact to touch and monofilament testing bilaterally: Yes Pulse Check Posterior Tibialis and Dorsalis pulse intact bilaterally: Yes Comments     ASSESSMENT/PLAN:   Uncontrolled  diabetes mellitus with diabetic neuropathy, with long-term current use of insulin (HCC) A1c 8.7 which is improved from last reading which was likely elevated from steroid use.  Patient admits to poor diet and activity and non-adherence with medication - performed foot exam  - Refilled tresiba at 32u - set exercise goals of walking for 20 minutes at least 1x/week - set diet goal of making a healthier choice while eating out at least 1x/week - recheck in 3 months  Depression Situational anhedonia/general depressed mood which is improved with having work Declines counseling or medication assistance at this time.  Will continue to monitor  HTN (hypertension) Well controlled. Continue current medication    Richarda Osmond, [place]

## 2019-07-01 NOTE — Assessment & Plan Note (Signed)
Well controlled. Continue current medication.  

## 2019-07-01 NOTE — Assessment & Plan Note (Signed)
Situational anhedonia/general depressed mood which is improved with having work Declines counseling or medication assistance at this time.  Will continue to monitor

## 2019-07-05 ENCOUNTER — Other Ambulatory Visit: Payer: Self-pay | Admitting: Student in an Organized Health Care Education/Training Program

## 2019-07-05 DIAGNOSIS — E1140 Type 2 diabetes mellitus with diabetic neuropathy, unspecified: Secondary | ICD-10-CM

## 2019-08-21 ENCOUNTER — Other Ambulatory Visit: Payer: Self-pay | Admitting: Student in an Organized Health Care Education/Training Program

## 2019-08-31 ENCOUNTER — Other Ambulatory Visit: Payer: Self-pay | Admitting: Student in an Organized Health Care Education/Training Program

## 2019-08-31 DIAGNOSIS — E11628 Type 2 diabetes mellitus with other skin complications: Secondary | ICD-10-CM
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
## 2019-10-22 ENCOUNTER — Ambulatory Visit (INDEPENDENT_AMBULATORY_CARE_PROVIDER_SITE_OTHER): Payer: Medicare HMO | Admitting: Interventional Cardiology

## 2019-10-22 ENCOUNTER — Telehealth: Payer: Self-pay | Admitting: Licensed Clinical Social Worker

## 2019-10-22 ENCOUNTER — Other Ambulatory Visit: Payer: Self-pay

## 2019-10-22 ENCOUNTER — Encounter: Payer: Self-pay | Admitting: Interventional Cardiology

## 2019-10-22 VITALS — BP 148/82 | HR 87 | Ht 67.0 in | Wt 203.6 lb

## 2019-10-22 DIAGNOSIS — E1159 Type 2 diabetes mellitus with other circulatory complications: Secondary | ICD-10-CM | POA: Diagnosis not present

## 2019-10-22 DIAGNOSIS — I25118 Atherosclerotic heart disease of native coronary artery with other forms of angina pectoris: Secondary | ICD-10-CM

## 2019-10-22 DIAGNOSIS — I10 Essential (primary) hypertension: Secondary | ICD-10-CM

## 2019-10-22 DIAGNOSIS — E782 Mixed hyperlipidemia: Secondary | ICD-10-CM

## 2019-10-22 LAB — LIPID PANEL
Chol/HDL Ratio: 2.8 ratio (ref 0.0–4.4)
Cholesterol, Total: 151 mg/dL (ref 100–199)
HDL: 54 mg/dL (ref >39)
LDL Chol Calc (NIH): 78 mg/dL (ref 0–99)
Triglycerides: 107 mg/dL (ref 0–149)
VLDL Cholesterol Cal: 19 mg/dL (ref 5–40)

## 2019-10-22 LAB — COMPREHENSIVE METABOLIC PANEL
ALT: 15 IU/L (ref 0–32)
AST: 14 IU/L (ref 0–40)
Albumin/Globulin Ratio: 1.4 (ref 1.2–2.2)
Albumin: 4.2 g/dL (ref 3.8–4.8)
Alkaline Phosphatase: 93 IU/L (ref 48–121)
BUN/Creatinine Ratio: 18 (ref 12–28)
BUN: 25 mg/dL (ref 8–27)
Bilirubin Total: 0.3 mg/dL (ref 0.0–1.2)
CO2: 22 mmol/L (ref 20–29)
Calcium: 9.8 mg/dL (ref 8.7–10.3)
Chloride: 104 mmol/L (ref 96–106)
Creatinine, Ser: 1.37 mg/dL — ABNORMAL HIGH (ref 0.57–1.00)
GFR calc Af Amer: 45 mL/min/{1.73_m2} — ABNORMAL LOW (ref >59)
GFR calc non Af Amer: 39 mL/min/{1.73_m2} — ABNORMAL LOW (ref >59)
Globulin, Total: 3.1 g/dL (ref 1.5–4.5)
Glucose: 199 mg/dL — ABNORMAL HIGH (ref 65–99)
Potassium: 4.9 mmol/L (ref 3.5–5.2)
Sodium: 140 mmol/L (ref 134–144)
Total Protein: 7.3 g/dL (ref 6.0–8.5)

## 2019-10-22 NOTE — Patient Instructions (Signed)
Medication Instructions:  Your physician recommends that you continue on your current medications as directed. Please refer to the Current Medication list given to you today.  *If you need a refill on your cardiac medications before your next appointment, please call your pharmacy*   Lab Work: TODAY: CMET, LIPIDS  If you have labs (blood work) drawn today and your tests are completely normal, you will receive your results only by: Marland Kitchen MyChart Message (if you have MyChart) OR . A paper copy in the mail If you have any lab test that is abnormal or we need to change your treatment, we will call you to review the results.   Testing/Procedures: None   Follow-Up: At [facility], you and your health needs are our priority.  As part of our continuing mission to provide you with exceptional heart care, we have created designated Provider Care Teams.  These Care Teams include your primary Cardiologist (physician) and Advanced Practice Providers (APPs -  Physician Assistants and Nurse Practitioners) who all work together to provide you with the care you need, when you need it.  We recommend signing up for the patient portal called "MyChart".  Sign up information is provided on this After Visit Summary.  MyChart is used to connect with patients for Virtual Visits (Telemedicine).  Patients are able to view lab/test results, encounter notes, upcoming appointments, etc.  Non-urgent messages can be sent to your provider as well.   To learn more about what you can do with MyChart, go to [email or web address].    Your next appointment:   12 month(s)  The format for your next appointment:   In Person  Provider:   You may see Casandra Doffing, MD or one of the following Advanced Practice Providers on your designated Care Team:    Melina Copa, PA-C  Ermalinda Barrios, PA-C    Other Instructions None

## 2019-10-22 NOTE — Telephone Encounter (Signed)
CSW received referral to assist patient with some housing options. Patient reports she has been living in various housing programs although multiple issues with the programs which did not lead to any permanent housing solutions according to patient. She is currently living with her friend who will lose her housing beginning Monday. She states that she and her 69yo  cousin [name], Taneshia's boyfriend and 4 children (21yo, 73yo, 58yo and 24yo) will all be homeless beginning Monday. Patient reports she has the only income for all 7 of them. Patient receives Social Security income of $1400 month. CSW provided information on local housing resources and suggested that patient consider independent housing for herself as she might be eligible for senior housing. Patient acknowledged information and stated that multiple people have recommended that she explore senior housing and encouraged her family to follow through on family housing options. CSW forwarded the information and resources to patient. She was grateful for the information and will follow up. CSW available if needed. Heather [name], Heather [name], Heather [name]

## 2019-11-03 ENCOUNTER — Telehealth: Payer: Self-pay

## 2019-11-03 ENCOUNTER — Telehealth: Payer: Self-pay | Admitting: Diagnostic Radiology

## 2019-11-10 DIAGNOSIS — H35371 Puckering of macula, right eye: Secondary | ICD-10-CM | POA: Diagnosis not present

## 2019-11-10 DIAGNOSIS — E113411 Type 2 diabetes mellitus with severe nonproliferative diabetic retinopathy with macular edema, right eye: Secondary | ICD-10-CM | POA: Diagnosis not present

## 2019-11-10 DIAGNOSIS — E113312 Type 2 diabetes mellitus with moderate nonproliferative diabetic retinopathy with macular edema, left eye: Secondary | ICD-10-CM | POA: Diagnosis not present

## 2019-11-10 DIAGNOSIS — H2513 Age-related nuclear cataract, bilateral: Secondary | ICD-10-CM | POA: Diagnosis not present

## 2019-12-01 DIAGNOSIS — E113411 Type 2 diabetes mellitus with severe nonproliferative diabetic retinopathy with macular edema, right eye: Secondary | ICD-10-CM | POA: Diagnosis not present

## 2019-12-01 DIAGNOSIS — E113312 Type 2 diabetes mellitus with moderate nonproliferative diabetic retinopathy with macular edema, left eye: Secondary | ICD-10-CM | POA: Diagnosis not present

## 2019-12-07 ENCOUNTER — Other Ambulatory Visit: Payer: Self-pay | Admitting: Student in an Organized Health Care Education/Training Program

## 2019-12-07 DIAGNOSIS — G629 Polyneuropathy, unspecified: Secondary | ICD-10-CM

## 2019-12-07 DIAGNOSIS — E1140 Type 2 diabetes mellitus with diabetic neuropathy, unspecified: Secondary | ICD-10-CM

## 2020-01-17 ENCOUNTER — Other Ambulatory Visit: Payer: Self-pay

## 2020-01-17 ENCOUNTER — Ambulatory Visit (INDEPENDENT_AMBULATORY_CARE_PROVIDER_SITE_OTHER): Payer: Medicare HMO | Admitting: Student in an Organized Health Care Education/Training Program

## 2020-01-17 VITALS — BP 160/80 | HR 84 | Ht 67.0 in | Wt 196.2 lb

## 2020-01-17 DIAGNOSIS — E1165 Type 2 diabetes mellitus with hyperglycemia: Secondary | ICD-10-CM

## 2020-01-17 DIAGNOSIS — E1140 Type 2 diabetes mellitus with diabetic neuropathy, unspecified: Secondary | ICD-10-CM | POA: Diagnosis not present

## 2020-01-17 DIAGNOSIS — G629 Polyneuropathy, unspecified: Secondary | ICD-10-CM | POA: Diagnosis not present

## 2020-01-17 DIAGNOSIS — Z794 Long term (current) use of insulin: Secondary | ICD-10-CM

## 2020-01-17 DIAGNOSIS — E782 Mixed hyperlipidemia: Secondary | ICD-10-CM | POA: Diagnosis not present

## 2020-01-17 DIAGNOSIS — I10 Essential (primary) hypertension: Secondary | ICD-10-CM | POA: Diagnosis not present

## 2020-01-17 DIAGNOSIS — IMO0002 Reserved for concepts with insufficient information to code with codable children: Secondary | ICD-10-CM

## 2020-01-17 LAB — POCT GLYCOSYLATED HEMOGLOBIN (HGB A1C): Hemoglobin A1C: 11.4 % — AB (ref 4.0–5.6)

## 2020-01-17 NOTE — Patient Instructions (Addendum)
It was a pleasure to see you today!  To summarize our discussion for this visit:  I want to see you back in 1 week while you have been on all your prescribed medications we discussed for that entire time. We will recheck your blood pressure and see if we need to add on the others.   The medications I want you to take  Tresiba every morning, check your blood sugars and increase your dose every day by a couple units until your morning blood sugar is about 120 for now.  Carvedilol twice per day  Metformin 1,000mg  twice per day. I will send in the smaller tablets of 500mg  each.   atorvasatin daily (any time of day)  Take gabapentin if needed but if not helping, stop taking it to reduce your pill burden  Call your insurance company to figure out what the deal is! Let me know if you need any assistance.   Some additional health maintenance measures we should update are: Health Maintenance Due  Topic Date Due  . COVID-19 Vaccine (1) Never done  . INFLUENZA VACCINE  09/26/2019  . OPHTHALMOLOGY EXAM  10/26/2019  .    Please return to our clinic to see me in [place]!  Call the clinic at [phone] if your symptoms worsen or you have any concerns.   Thank you for allowing me to take part in your care,  Dr. Doristine Mango

## 2020-01-17 NOTE — Progress Notes (Signed)
   SUBJECTIVE:   CHIEF COMPLAINT / HPI: f/u DM, HTN  DM- A1c today 11.4, was 8.7 5/21 Not taking any medications for a month. Feels like she just gets out of the habit of taking the medications but has them with her.  Checking blood sugars daily and says they are in the 300s sometimes. Having difficulty getting one medication due to insurance/pharmacy issues but doesn't know which one. Denies polyuria, polydipsia or other negative side effects. Difficulty with diet and exercise due to her living situation. Does not feel that gabapentin improves her neuropathy but thinks that these symptoms are lessoning any way.  HTN- also non-adherent with medications > 1 month but denies negative side effects.  Medications needing refills: Amlodipine [place] Test strips Lisinopril-hydrochlorothiazide  OBJECTIVE:   BP (!) 160/80   Pulse 84   Ht 5\' 7"  (1.702 m)   Wt 196 lb 3.2 oz (89 kg)   SpO2 99%   BMI 30.73 kg/m   General: NAD, pleasant, able to participate in exam Cardiac: RRR, normal heart sounds, no murmurs. 2+ radial and PT pulses bilaterally Respiratory: CTAB, normal effort, No wheezes, rales or rhonchi Extremities: no edema. WWP. Skin: warm and dry, no rashes noted Neuro: alert and oriented, no focal deficits Psych: Normal affect and mood  ASSESSMENT/PLAN:   Uncontrolled diabetes mellitus with diabetic neuropathy, with long-term current use of insulin (HCC) Restart tresiba at previous dose of 32u am.  Check blood sugars every morning and titrate insulin up until fasting glucose consistently 120 or below Continue taking metformin 1,000mg  BID. Patient has a large supply since she has not been taking the medication and we discussed that we will change to the 500mg  tablets for easier swallowing once these are completed Follow up appointment in 1 week  Neuropathy Can trial taking gabapentin BID but if not helping and symptoms are improved spontaneously, can stop taking also  HLD  (hyperlipidemia) Has not been adherent with atorvastatin but has a large supply Agrees to start taking  HTN (hypertension) Hypertensive to 160/80 today and has not been taking medication for >79mo. Has large supply of carvedilol currently but is out of her amlodipine and lisinopril-hydrochlorothiazide medications - patient agreed to start taking carvedilol BID and follow up in 1 week.  - if not well controlled at follow up, will add on second agent, I would favor an [place]

## 2020-01-18 NOTE — Assessment & Plan Note (Signed)
Has not been adherent with atorvastatin but has a large supply Agrees to start taking

## 2020-01-18 NOTE — Assessment & Plan Note (Signed)
Restart tresiba at previous dose of 32u am.  Check blood sugars every morning and titrate insulin up until fasting glucose consistently 120 or below Continue taking metformin 1,000mg  BID. Patient has a large supply since she has not been taking the medication and we discussed that we will change to the 500mg  tablets for easier swallowing once these are completed Follow up appointment in 1 week

## 2020-01-18 NOTE — Assessment & Plan Note (Signed)
Can trial taking gabapentin BID but if not helping and symptoms are improved spontaneously, can stop taking also

## 2020-01-18 NOTE — Assessment & Plan Note (Signed)
Hypertensive to 160/80 today and has not been taking medication for >86mo. Has large supply of carvedilol currently but is out of her amlodipine and lisinopril-hydrochlorothiazide medications - patient agreed to start taking carvedilol BID and follow up in 1 week.  - if not well controlled at follow up, will add on second agent, I would favor an ARB

## 2020-01-27 ENCOUNTER — Ambulatory Visit (INDEPENDENT_AMBULATORY_CARE_PROVIDER_SITE_OTHER): Payer: Medicare HMO | Admitting: Student in an Organized Health Care Education/Training Program

## 2020-01-27 ENCOUNTER — Other Ambulatory Visit: Payer: Self-pay

## 2020-01-27 ENCOUNTER — Encounter: Payer: Self-pay | Admitting: Student in an Organized Health Care Education/Training Program

## 2020-01-27 VITALS — BP 150/62 | HR 77 | Ht 67.0 in | Wt 197.5 lb

## 2020-01-27 DIAGNOSIS — E1140 Type 2 diabetes mellitus with diabetic neuropathy, unspecified: Secondary | ICD-10-CM

## 2020-01-27 DIAGNOSIS — Z599 Problem related to housing and economic circumstances, unspecified: Secondary | ICD-10-CM | POA: Diagnosis not present

## 2020-01-27 DIAGNOSIS — Z23 Encounter for immunization: Secondary | ICD-10-CM

## 2020-01-27 DIAGNOSIS — Z794 Long term (current) use of insulin: Secondary | ICD-10-CM

## 2020-01-27 DIAGNOSIS — IMO0002 Reserved for concepts with insufficient information to code with codable children: Secondary | ICD-10-CM

## 2020-01-27 DIAGNOSIS — I10 Essential (primary) hypertension: Secondary | ICD-10-CM | POA: Diagnosis not present

## 2020-01-27 DIAGNOSIS — E1165 Type 2 diabetes mellitus with hyperglycemia: Secondary | ICD-10-CM

## 2020-01-27 DIAGNOSIS — Z59819 Housing instability, housed unspecified: Secondary | ICD-10-CM

## 2020-01-27 NOTE — Assessment & Plan Note (Signed)
Daily tresiba increased to 44 units and stay there for now. Continue to monitor fasting blood sugars Continue metformin and atorvastatin Diet is poor at this time but hard to manage with housing insecurities

## 2020-01-27 NOTE — Progress Notes (Signed)
   SUBJECTIVE:   CHIEF COMPLAINT / HPI: medication follow up, DM, HTN  Medication management- states she has been adherent with the medications we discussed restarting at our last appointment for all days but today due to housing issues as discussed below.  DM- Last took insulin yesterday- 44 units and fasting glucose was 111. Had gotten up to 48 units since last appointment and went back down due to glucose in 50s one time but was asymptomatic. Checking blood sugar once per day. Her diet has been highly variable and irregular. Wants an implantable monitor.   HTN-  Did not take medication today. Continues to deny side effects.  Vaccines- flu and covid booster  Housing insecurities- friend she was staying with no longer letting anyone stay with her so was unexpectedly kicked out of home yesterday. Stayed in a hotel last night. Today she is going to stay with a cousin for a month but will have to be out by the first of the year when that cousin moves. She has spoken with Neoma Laming before and is open to seeing If she has any more resources for housing. Patient remains highly optimistic in her circumstances.  Still working at [employer] but is intermittent and starting January will have 2 day work week. Spending $200 of storage monthly.   Nighttime dry cough for several years.  OBJECTIVE:   BP (!) 150/62   Pulse 77   Ht 5\' 7"  (1.702 m)   Wt 197 lb 8 oz (89.6 kg)   SpO2 99%   BMI 30.93 kg/m   General: NAD, pleasant, able to participate in exam Cardiac: RRR, normal heart sounds, no murmurs. 2+ radial and PT pulses bilaterally Respiratory: CTAB, normal effort, No wheezes, rales or rhonchi Abdomen: soft, nontender, nondistended, no hepatic or splenomegaly, +BS Extremities: no edema. WWP. Skin: warm and dry, no rashes noted Neuro: alert and oriented, no focal deficits Psych: Normal affect and mood  ASSESSMENT/PLAN:   HTN (hypertension) Improved today. On carvedilol only right now  but did not take this morning. Asymptomatic Continue to take carvedilol and follow up in 2 weeks Consider ARB if needing additional agent  Uncontrolled diabetes mellitus with diabetic neuropathy, with long-term current use of insulin (HCC) Daily tresiba increased to 44 units and stay there for now. Continue to monitor fasting blood sugars Continue metformin and atorvastatin Diet is poor at this time but hard to manage with housing insecurities   Housing instability Has short-term housing plan to stay with cousin but most of her belongings are in car or storage. So important for patient to have stable living situation in order for her to be able to take her medications and prioritize her health. - reach out to [facility] to help with housing insecurities and resources by patient's permission - recommended patient try to consolidate and reduce her storage belongings to save money - assess food insecurities more thoroughly at next visit     Richarda Osmond, [place]

## 2020-01-27 NOTE — Assessment & Plan Note (Signed)
Has short-term housing plan to stay with cousin but most of her belongings are in car or storage. So important for patient to have stable living situation in order for her to be able to take her medications and prioritize her health. - reach out to CCM to help with housing insecurities and resources by patient's permission - recommended patient try to consolidate and reduce her storage belongings to save money - assess food insecurities more thoroughly at next visit

## 2020-01-27 NOTE — Assessment & Plan Note (Addendum)
Improved today. On carvedilol only right now but did not take this morning. Asymptomatic Continue to take carvedilol and follow up in 2 weeks Consider ARB if needing additional agent

## 2020-01-27 NOTE — Patient Instructions (Addendum)
It was a pleasure to see you today!  To summarize our discussion for this visit:  Housing- I will reach out to Neoma Laming to see if we can get some more housing resources.   Diabetes- continue to take 44 units of insulin and check your blood sugars daily. Once you get your insurance company settled, I can reach out to our pharmacy team for information on monitoring systems  Hypertension- take your carvedilol twice per day.   Vaccines- you got protection from flu and covid today    Some additional health maintenance measures we should update are: Health Maintenance Due  Topic Date Due   INFLUENZA VACCINE  09/26/2019   OPHTHALMOLOGY EXAM  10/26/2019      Please return to our clinic to see me in 2 weeks.  Call the clinic at [phone] if your symptoms worsen or you have any concerns.   Thank you for allowing me to take part in your care,  Dr. Doristine Mango   DASH Eating Plan DASH stands for "Dietary Approaches to Stop Hypertension." The DASH eating plan is a healthy eating plan that has been shown to reduce high blood pressure (hypertension). It may also reduce your risk for type 2 diabetes, heart disease, and stroke. The DASH eating plan may also help with weight loss. What are tips for following this plan?  General guidelines  Avoid eating more than 2,300 mg (milligrams) of salt (sodium) a day. If you have hypertension, you may need to reduce your sodium intake to 1,500 mg a day.  Limit alcohol intake to no more than 1 drink a day for nonpregnant women and 2 drinks a day for men. One drink equals 12 oz of beer, 5 oz of wine, or 1 oz of hard liquor.  Work with your health care provider to maintain a healthy body weight or to lose weight. Ask what an ideal weight is for you.  Get at least 30 minutes of exercise that causes your heart to beat faster (aerobic exercise) most days of the week. Activities may include walking, swimming, or biking.  Work with your health care  provider or diet and nutrition specialist (dietitian) to adjust your eating plan to your individual calorie needs. Reading food labels   Check food labels for the amount of sodium per serving. Choose foods with less than 5 percent of the Daily Value of sodium. Generally, foods with less than 300 mg of sodium per serving fit into this eating plan.  To find whole grains, look for the word "whole" as the first word in the ingredient list. Shopping  Buy products labeled as "low-sodium" or "no salt added."  Buy fresh foods. Avoid canned foods and premade or frozen meals. Cooking  Avoid adding salt when cooking. Use salt-free seasonings or herbs instead of table salt or sea salt. Check with your health care provider or pharmacist before using salt substitutes.  Do not fry foods. Cook foods using healthy methods such as baking, boiling, grilling, and broiling instead.  Cook with heart-healthy oils, such as olive, canola, soybean, or sunflower oil. Meal planning  Eat a balanced diet that includes: ? 5 or more servings of fruits and vegetables each day. At each meal, try to fill half of your plate with fruits and vegetables. ? Up to 6-8 servings of whole grains each day. ? Less than 6 oz of lean meat, poultry, or fish each day. A 3-oz serving of meat is about the same size as a deck of cards.  One egg equals 1 oz. ? 2 servings of low-fat dairy each day. ? A serving of nuts, seeds, or beans 5 times each week. ? Heart-healthy fats. Healthy fats called Omega-3 fatty acids are found in foods such as flaxseeds and coldwater fish, like sardines, salmon, and mackerel.  Limit how much you eat of the following: ? Canned or prepackaged foods. ? Food that is high in trans fat, such as fried foods. ? Food that is high in saturated fat, such as fatty meat. ? Sweets, desserts, sugary drinks, and other foods with added sugar. ? Full-fat dairy products.  Do not salt foods before eating.  Try to eat at  least 2 vegetarian meals each week.  Eat more home-cooked food and less restaurant, buffet, and fast food.  When eating at a restaurant, ask that your food be prepared with less salt or no salt, if possible. What foods are recommended? The items listed may not be a complete list. Talk with your dietitian about what dietary choices are best for you. Grains Whole-grain or whole-wheat bread. Whole-grain or whole-wheat pasta. Brown rice. [place]. Bulgur. Whole-grain and low-sodium cereals. Pita bread. Low-fat, low-sodium crackers. Whole-wheat flour tortillas. Vegetables Fresh or frozen vegetables (raw, steamed, roasted, or grilled). Low-sodium or reduced-sodium tomato and vegetable juice. Low-sodium or reduced-sodium tomato sauce and tomato paste. Low-sodium or reduced-sodium canned vegetables. Fruits All fresh, dried, or frozen fruit. Canned fruit in natural juice (without added sugar). Meat and other protein foods Skinless chicken or [place]. Ground chicken or [place]. Pork with fat trimmed off. Fish and seafood. Egg whites. Dried beans, peas, or lentils. Unsalted nuts, nut butters, and seeds. Unsalted canned beans. Lean cuts of beef with fat trimmed off. Low-sodium, lean deli meat. Dairy Low-fat (1%) or fat-free (skim) milk. Fat-free, low-fat, or reduced-fat cheeses. Nonfat, low-sodium ricotta or cottage cheese. Low-fat or nonfat yogurt. Low-fat, low-sodium cheese. Fats and oils Soft margarine without trans fats. Vegetable oil. Low-fat, reduced-fat, or light mayonnaise and salad dressings (reduced-sodium). Canola, safflower, olive, soybean, and sunflower oils. Avocado. Seasoning and other foods Herbs. Spices. Seasoning mixes without salt. Unsalted popcorn and pretzels. Fat-free sweets. What foods are not recommended? The items listed may not be a complete list. Talk with your dietitian about what dietary choices are best for you. Grains Baked goods made with fat, such as croissants,  muffins, or some breads. Dry pasta or rice meal packs. Vegetables Creamed or fried vegetables. Vegetables in a cheese sauce. Regular canned vegetables (not low-sodium or reduced-sodium). Regular canned tomato sauce and paste (not low-sodium or reduced-sodium). Regular tomato and vegetable juice (not low-sodium or reduced-sodium). Angie Fava. Olives. Fruits Canned fruit in a light or heavy syrup. Fried fruit. Fruit in cream or butter sauce. Meat and other protein foods Fatty cuts of meat. Ribs. Fried meat. Berniece Salines. Sausage. Bologna and other processed lunch meats. Salami. Fatback. Hotdogs. Bratwurst. Salted nuts and seeds. Canned beans with added salt. Canned or smoked fish. Whole eggs or egg yolks. Chicken or [place] with skin. Dairy Whole or 2% milk, cream, and half-and-half. Whole or full-fat cream cheese. Whole-fat or sweetened yogurt. Full-fat cheese. Nondairy creamers. Whipped toppings. Processed cheese and cheese spreads. Fats and oils Butter. Stick margarine. Lard. Shortening. Ghee. Bacon fat. Tropical oils, such as coconut, palm kernel, or palm oil. Seasoning and other foods Salted popcorn and pretzels. Onion salt, garlic salt, seasoned salt, table salt, and sea salt. Worcestershire sauce. Tartar sauce. Barbecue sauce. Teriyaki sauce. Soy sauce, including reduced-sodium. Steak sauce. Canned and packaged gravies.  Fish sauce. Oyster sauce. Cocktail sauce. Horseradish that you find on the shelf. Ketchup. Mustard. Meat flavorings and tenderizers. Bouillon cubes. Hot sauce and Tabasco sauce. Premade or packaged marinades. Premade or packaged taco seasonings. Relishes. Regular salad dressings. Where to find more information:  National Heart, Lung, and [place]: https://wilson-eaton.com/  American Heart Association: www.heart.org Summary  The DASH eating plan is a healthy eating plan that has been shown to reduce high blood pressure (hypertension). It may also reduce your risk for type 2 diabetes,  heart disease, and stroke.  With the DASH eating plan, you should limit salt (sodium) intake to 2,300 mg a day. If you have hypertension, you may need to reduce your sodium intake to 1,500 mg a day.  When on the DASH eating plan, aim to eat more fresh fruits and vegetables, whole grains, lean proteins, low-fat dairy, and heart-healthy fats.  Work with your health care provider or diet and nutrition specialist (dietitian) to adjust your eating plan to your individual calorie needs. This information is not intended to replace advice given to you by your health care provider. Make sure you discuss any questions you have with your health care provider. Document Revised: 01/24/2017 Document Reviewed: 02/05/2016 Elsevier Patient Education  2020 [employer].

## 2020-02-07 ENCOUNTER — Ambulatory Visit: Payer: Medicare HMO | Admitting: Student in an Organized Health Care Education/Training Program

## 2020-02-09 ENCOUNTER — Ambulatory Visit: Payer: Medicare HMO | Admitting: Student in an Organized Health Care Education/Training Program

## 2020-02-14 ENCOUNTER — Encounter: Payer: Self-pay | Admitting: Student in an Organized Health Care Education/Training Program

## 2020-02-14 ENCOUNTER — Other Ambulatory Visit: Payer: Self-pay

## 2020-02-14 ENCOUNTER — Ambulatory Visit (INDEPENDENT_AMBULATORY_CARE_PROVIDER_SITE_OTHER): Payer: Medicare HMO | Admitting: Student in an Organized Health Care Education/Training Program

## 2020-02-14 VITALS — BP 178/82 | HR 81 | Wt 204.4 lb

## 2020-02-14 DIAGNOSIS — E1140 Type 2 diabetes mellitus with diabetic neuropathy, unspecified: Secondary | ICD-10-CM | POA: Diagnosis not present

## 2020-02-14 DIAGNOSIS — I10 Essential (primary) hypertension: Secondary | ICD-10-CM

## 2020-02-14 DIAGNOSIS — Z59819 Housing instability, housed unspecified: Secondary | ICD-10-CM

## 2020-02-14 DIAGNOSIS — E1165 Type 2 diabetes mellitus with hyperglycemia: Secondary | ICD-10-CM

## 2020-02-14 DIAGNOSIS — Z599 Problem related to housing and economic circumstances, unspecified: Secondary | ICD-10-CM

## 2020-02-14 DIAGNOSIS — Z794 Long term (current) use of insulin: Secondary | ICD-10-CM | POA: Diagnosis not present

## 2020-02-14 DIAGNOSIS — IMO0002 Reserved for concepts with insufficient information to code with codable children: Secondary | ICD-10-CM

## 2020-02-14 MED ORDER — LISINOPRIL-HYDROCHLOROTHIAZIDE 20-25 MG PO TABS: 1.0000 | ORAL_TABLET | Freq: Every day | ORAL | 3 refills | Status: DC

## 2020-02-14 NOTE — Progress Notes (Signed)
    SUBJECTIVE:   CHIEF COMPLAINT / HPI: f/u HTN, DM, housing instability  HTN- taking carvedilol daily including today. Endorses LE swelling following days that she works (2 days per week) which improves with elevation. Denies headache, blurred vision, CP. No new SOB, no dyspnea on exertion.  DM- 80s-111s fasting glucoses. Using 44u insulin daily. adherent with metformin. Denies hypoglycemia symptoms.  January scheduled for eye doctor f/u retinal.  Interested in getting a pump after insurance changes over.   Housing- staying with a friend. Working with a community clinic to find affordable housing for her, her friend, a cousin and her two children for end of January.   OBJECTIVE:   BP (!) 178/82   Pulse 81   Wt 204 lb 6.4 oz (92.7 kg)   SpO2 98%   BMI 32.01 kg/m   General: NAD, pleasant, able to participate in exam Cardiac: RRR, normal heart sounds, no murmurs. 2+ radial and PT pulses bilaterally Respiratory: CTAB, normal effort, No wheezes, rales or rhonchi Extremities: no edema. WWP. Skin: warm and dry, no rashes noted Neuro: alert and oriented, no focal deficits Psych: Normal affect and mood  ASSESSMENT/PLAN:   HTN (hypertension) Elevated but asymptomatic.  Adherent with carvedilol daily Restart lisinopril/HCTZ BMP today Recheck 2 weeks after beginning medication. Previously on amlodipine as well.  Uncontrolled diabetes mellitus with diabetic neuropathy, with long-term current use of insulin (HCC) Fasting glucoses have been well controlled since last visit.  Continue metformin and 44u insulin daily Keeping regimen and medications simplified at this time due to complex social situation and insurance difficulties. Recheck A1c at next appointment Consider adding back on SGLT-2  Housing instability Continues search for affordable housing.     [place]

## 2020-02-14 NOTE — Patient Instructions (Addendum)
It was a pleasure to see you today!  To summarize our discussion for this visit:  Compression stockings- use these when getting up to prevent lower leg swelling  DM- keep up the good work with the insulin and monitoring your blood sugars. We will discuss making medication changes once your housing situation is more stable and we recheck your A1c.  We need to add on one of your old blood pressure medications. It's a combination pill. We'll check a metabolic panel today and 2 weeks after starting on that medication  Some additional health maintenance measures we should update are: Health Maintenance Due  Topic Date Due  . OPHTHALMOLOGY EXAM  10/26/2019  .    Please return to our clinic to see me 2 weeks after starting your blood pressure medicine.  Call the clinic at [phone] if your symptoms worsen or you have any concerns.   Thank you for allowing me to take part in your care,  Dr. Doristine Mango

## 2020-02-14 NOTE — Assessment & Plan Note (Signed)
Fasting glucoses have been well controlled since last visit.  Continue metformin and 44u insulin daily Keeping regimen and medications simplified at this time due to complex social situation and insurance difficulties. Recheck A1c at next appointment Consider adding back on SGLT-2

## 2020-02-14 NOTE — Assessment & Plan Note (Signed)
Elevated but asymptomatic.  Adherent with carvedilol daily Restart lisinopril/HCTZ BMP today Recheck 2 weeks after beginning medication. Previously on amlodipine as well.

## 2020-02-14 NOTE — Assessment & Plan Note (Signed)
Continues search for affordable housing.

## 2020-02-15 LAB — BASIC METABOLIC PANEL
BUN/Creatinine Ratio: 20 (ref 12–28)
BUN: 24 mg/dL (ref 8–27)
CO2: 22 mmol/L (ref 20–29)
Calcium: 9.2 mg/dL (ref 8.7–10.3)
Chloride: 109 mmol/L — ABNORMAL HIGH (ref 96–106)
Creatinine, Ser: 1.18 mg/dL — ABNORMAL HIGH (ref 0.57–1.00)
GFR calc Af Amer: 54 mL/min/{1.73_m2} — ABNORMAL LOW (ref >59)
GFR calc non Af Amer: 47 mL/min/{1.73_m2} — ABNORMAL LOW (ref >59)
Glucose: 107 mg/dL — ABNORMAL HIGH (ref 65–99)
Potassium: 4.2 mmol/L (ref 3.5–5.2)
Sodium: 145 mmol/L — ABNORMAL HIGH (ref 134–144)

## 2020-02-18 ENCOUNTER — Other Ambulatory Visit: Payer: Self-pay | Admitting: Student in an Organized Health Care Education/Training Program

## 2020-02-18 DIAGNOSIS — E11628 Type 2 diabetes mellitus with other skin complications: Secondary | ICD-10-CM

## 2020-03-06 ENCOUNTER — Encounter: Payer: Self-pay | Admitting: Student in an Organized Health Care Education/Training Program

## 2020-03-06 ENCOUNTER — Other Ambulatory Visit: Payer: Self-pay

## 2020-03-06 ENCOUNTER — Ambulatory Visit (INDEPENDENT_AMBULATORY_CARE_PROVIDER_SITE_OTHER): Payer: Medicare HMO | Admitting: Student in an Organized Health Care Education/Training Program

## 2020-03-06 DIAGNOSIS — Z794 Long term (current) use of insulin: Secondary | ICD-10-CM | POA: Diagnosis not present

## 2020-03-06 DIAGNOSIS — E1140 Type 2 diabetes mellitus with diabetic neuropathy, unspecified: Secondary | ICD-10-CM | POA: Diagnosis not present

## 2020-03-06 DIAGNOSIS — I10 Essential (primary) hypertension: Secondary | ICD-10-CM | POA: Diagnosis not present

## 2020-03-06 DIAGNOSIS — M79645 Pain in left finger(s): Secondary | ICD-10-CM | POA: Diagnosis not present

## 2020-03-06 DIAGNOSIS — IMO0002 Reserved for concepts with insufficient information to code with codable children: Secondary | ICD-10-CM

## 2020-03-06 DIAGNOSIS — E1165 Type 2 diabetes mellitus with hyperglycemia: Secondary | ICD-10-CM

## 2020-03-06 NOTE — Progress Notes (Signed)
   SUBJECTIVE:   CHIEF COMPLAINT / HPI: HTN  HTN- patient has been taking 2 medications which she did not bring with her.  She believes 1 is carvedilol and does not know the other.  Denies symptoms of hypertension.  States that the reason why she has not been adherent with her other medications is due to affordability.  Patient has been changing her insurance type multiple times over the past couple of months.  She could not figure out how to use good Rx.  She would appreciate assistance from pharmacy to figure out affordability of her medications.  She would be able to pay for $3 cost of medication use and she gets paid later this week.  She did receive the prescribed blood pressure cuff and states that at home she has had readings as high as 175/100.  DM-has been taking metformin and 44 units insulin every day.  On average she thinks her fasting blood sugars have been around 120 but she has not checked it in a couple of days due to running out of test strips and not being able to afford more.  Denies symptoms of hyper or hypoglycemia  Finger stiffness-couple weeks of L second digit. No injury. No redness or swelling. Pain with flexion. No decreased strength.  Has not tried anything for it yet.  Medicare- changed to [employer] but does not know for sure which program.   Eye dr appointment is scheduled on Wednesday. Seen last year and a year and a half ago for retinal laser treatment.  OBJECTIVE:   BP (!) 156/92   Pulse 81   Wt 203 lb 6.4 oz (92.3 kg)   SpO2 97%   BMI 31.86 kg/m   General: NAD, pleasant, able to participate in exam Cardiac: RRR, normal heart sounds, no murmurs. Respiratory: CTAB, normal effort, No wheezes, rales or rhonchi Extremities: no edema. WWP.  Left second digit has no edema, pain, joint tenderness, redness or skin lesions.  Patient has full range of motion and strength.  Some pain with resisted extension.  Able to flex the finger completely with some  tenderness. Skin: warm and dry, no rashes noted Neuro: alert and oriented, no focal deficits Psych: Normal affect and mood  ASSESSMENT/PLAN:   HTN (hypertension) Poorly controlled secondary to poor medication adherence.  Affordability is an issue with several insurance program changes recently. Patient would benefit from pharmacy consult Recheck blood pressure at next appointment  Uncontrolled diabetes mellitus with diabetic neuropathy, with long-term current use of insulin ([place]) Recent measured blood sugars have been well controlled and patient does not have symptoms of low or high blood sugars Continue metformin and 44 units Lantus daily Consider other therapies when insurance has been figured out.  Patient would like to get a continuous glucose monitor and has been speaking to her insurance company about this A1c, BMP, lipid panel at next appointment  Finger pain, left Recommended buddy taping for couple weeks and if not improving then will get x-rays.  Low suspicion for fracture or infection or gout.  Possibly inflammation or arthritic symptoms     Richarda Osmond, [place]

## 2020-03-06 NOTE — Patient Instructions (Addendum)
It was a pleasure to see you today!  To summarize our discussion for this visit:  We are somewhat in limbo until insurance is finalized. I will reach out to our pharmacy team to see if they can assist in educating [place] on medication coverage under your current plan so we can get you well taken care of.   I would like to see you back feb 22nd or shortly after to check in on your blood pressures and diabetes again.   Please let me know if you need anything else in the meantime.  Some additional health maintenance measures we should update are: Health Maintenance Due  Topic Date Due  . OPHTHALMOLOGY EXAM  10/26/2019  .    Please return to our clinic to see me end of february.  Call the clinic at [phone] if your symptoms worsen or you have any concerns.   Thank you for allowing me to take part in your care,  Dr. Doristine Mango

## 2020-03-08 DIAGNOSIS — M79645 Pain in left finger(s): Secondary | ICD-10-CM | POA: Insufficient documentation

## 2020-03-08 NOTE — Assessment & Plan Note (Signed)
Poorly controlled secondary to poor medication adherence.  Affordability is an issue with several insurance program changes recently. Patient would benefit from pharmacy consult Recheck blood pressure at next appointment

## 2020-03-08 NOTE — Assessment & Plan Note (Addendum)
Recent measured blood sugars have been well controlled and patient does not have symptoms of low or high blood sugars Continue metformin and 44 units Lantus daily Consider other therapies when insurance has been figured out.  Patient would like to get a continuous glucose monitor and has been speaking to her insurance company about this A1c, BMP, lipid panel at next appointment

## 2020-03-08 NOTE — Assessment & Plan Note (Signed)
Recommended buddy taping for couple weeks and if not improving then will get x-rays.  Low suspicion for fracture or infection or gout.  Possibly inflammation or arthritic symptoms

## 2020-03-16 ENCOUNTER — Other Ambulatory Visit: Payer: Self-pay | Admitting: Family Medicine

## 2020-03-16 DIAGNOSIS — IMO0002 Reserved for concepts with insufficient information to code with codable children: Secondary | ICD-10-CM

## 2020-03-16 DIAGNOSIS — E1140 Type 2 diabetes mellitus with diabetic neuropathy, unspecified: Secondary | ICD-10-CM

## 2020-03-16 MED ORDER — ACCU-CHEK AVIVA PLUS VI STRP: 1.0000 | ORAL_STRIP | Freq: Four times a day (QID) | 12 refills | Status: DC

## 2020-03-16 NOTE — Progress Notes (Signed)
Rx sent for test strip while covering Dr. Loletha Grayer. Anderson's inbox.  Per fax, needs accu-chek.  Rx sent.

## 2020-03-22 ENCOUNTER — Other Ambulatory Visit: Payer: Self-pay | Admitting: Student in an Organized Health Care Education/Training Program

## 2020-03-22 DIAGNOSIS — E11628 Type 2 diabetes mellitus with other skin complications: Secondary | ICD-10-CM

## 2020-04-19 ENCOUNTER — Ambulatory Visit: Payer: Medicare HMO | Admitting: Student in an Organized Health Care Education/Training Program

## 2020-04-25 ENCOUNTER — Other Ambulatory Visit: Payer: Self-pay

## 2020-04-25 ENCOUNTER — Ambulatory Visit (INDEPENDENT_AMBULATORY_CARE_PROVIDER_SITE_OTHER): Payer: Medicare (Managed Care) | Admitting: Student in an Organized Health Care Education/Training Program

## 2020-04-25 ENCOUNTER — Encounter: Payer: Self-pay | Admitting: Student in an Organized Health Care Education/Training Program

## 2020-04-25 VITALS — BP 198/100 | HR 99 | Wt 210.0 lb

## 2020-04-25 DIAGNOSIS — E1140 Type 2 diabetes mellitus with diabetic neuropathy, unspecified: Secondary | ICD-10-CM | POA: Diagnosis not present

## 2020-04-25 DIAGNOSIS — E1169 Type 2 diabetes mellitus with other specified complication: Secondary | ICD-10-CM

## 2020-04-25 DIAGNOSIS — W108XXA Fall (on) (from) other stairs and steps, initial encounter: Secondary | ICD-10-CM

## 2020-04-25 DIAGNOSIS — R011 Cardiac murmur, unspecified: Secondary | ICD-10-CM

## 2020-04-25 DIAGNOSIS — Z5900 Homelessness unspecified: Secondary | ICD-10-CM

## 2020-04-25 DIAGNOSIS — IMO0002 Reserved for concepts with insufficient information to code with codable children: Secondary | ICD-10-CM

## 2020-04-25 DIAGNOSIS — E785 Hyperlipidemia, unspecified: Secondary | ICD-10-CM

## 2020-04-25 DIAGNOSIS — I10 Essential (primary) hypertension: Secondary | ICD-10-CM

## 2020-04-25 DIAGNOSIS — E1165 Type 2 diabetes mellitus with hyperglycemia: Secondary | ICD-10-CM

## 2020-04-25 DIAGNOSIS — W19XXXA Unspecified fall, initial encounter: Secondary | ICD-10-CM | POA: Diagnosis not present

## 2020-04-25 DIAGNOSIS — Z794 Long term (current) use of insulin: Secondary | ICD-10-CM | POA: Diagnosis not present

## 2020-04-25 DIAGNOSIS — Z59819 Housing instability, housed unspecified: Secondary | ICD-10-CM

## 2020-04-25 DIAGNOSIS — Z599 Problem related to housing and economic circumstances, unspecified: Secondary | ICD-10-CM

## 2020-04-25 LAB — POCT GLYCOSYLATED HEMOGLOBIN (HGB A1C): Hemoglobin A1C: 9.8 % — AB (ref 4.0–5.6)

## 2020-04-25 NOTE — Progress Notes (Signed)
SUBJECTIVE:   CHIEF COMPLAINT / HPI: f/u DM, HTN  Social situation-patient continues to have labile social situation.  She is currently homeless and staying with a social contact.  This person does not want her to sleep on the couch so wants her to sleep on the floor on an air mattress but she is not able to get up off the floor easily and had an accident as she was getting up.  This person is asking her to pay $75 per week and rent.  When leaving her last home 2 weeks ago she thinks that all of her medications were thrown out so she has not taken any medications for 2 weeks.  She has changed her insurance to [employer].  She is working Tuesdays and Thursdays and makes about $100 per week.  She still has her vehicle. Fell up concrete stairs last week.  HTN-patient endorses lower extremity swelling in the ankles which is only slightly improved with elevation.  Has not taken any medications and does not have a blood pressure monitor anymore at home.  Denies chest pain, shortness of breath, orthopnea, visual changes and headache.  She has been taking flaxseed oil as recommended by her friends.  DM-was adherent with Tresiba 44 units daily up until 2 weeks ago when she lost her medications.  Has not been taking any medications from that time.  OBJECTIVE:   BP (!) 198/100   Pulse 99   Wt 210 lb (95.3 kg)   SpO2 97%   BMI 32.89 kg/m   Physical Exam Vitals and nursing note reviewed.  Constitutional:      Appearance: She is obese.  HENT:     Head:     Comments: Healing lesion on superior lip Cardiovascular:     Rate and Rhythm: Normal rate and regular rhythm.     Heart sounds: Murmur heard.    Pulmonary:     Effort: Pulmonary effort is normal.     Breath sounds: Normal breath sounds. No wheezing.  Abdominal:     Palpations: Abdomen is soft.     Tenderness: There is no abdominal tenderness.  Musculoskeletal:     Right lower leg: Edema present.     Left lower leg: Edema present.      Comments: Abrasion on left patella. Positive crepitus and tenderness to palpation  Skin:    General: Skin is warm and dry.     Capillary Refill: Capillary refill takes less than 2 seconds.  Neurological:     Mental Status: She is alert.    ASSESSMENT/PLAN:   HTN (hypertension) Poorly controlled and not on any medications. Lower extremity edema present and new or worsened systolic murmur on exam. -Consulted pharmacy, CCM for additional resources for medications -Ordered an echo to evaluate cardiac function.  Previous echo 2017 showed EF 60 to 67% - provided with s&s of hypertensive emergency to return to me or ED -Recommend starting an ARB, carvedilol and rechecking in 2 weeks  Uncontrolled diabetes mellitus with diabetic neuropathy, with long-term current use of insulin ([facility]) tresiba 44u daily. Sample given at visit today as well as instructions by Dr. Valentina Lucks. A1c today improved to 9.9 Refill metformin and start with 500mg  BID. Will keep 500mg  dose tablets for ease of swallowing and increase to 1,000mg  BID as tolerated. - restart statin - consider GLP-1 (ozempic) at follow up - prescribe new glucometer and testing supplies  Housing instability Currently in unsafe living conditions (asked to sleep on mat on the  floor and cannot get up in time to use bathroom, asked to pay $75/week in rent and only makes $100/week at job) CCM referral.   Fall (on) (from) other stairs and steps, initial encounter Able to ambulate with discomfort. Healing abrasions of knee and upper lip.  -Xray of left knee. Continues to have finger pain, likely arthritis - xray L ring finger    Richarda Osmond, [place]   Orders Placed This Encounter  Procedures  . DG Knee 1-2 Views Left  . DG Finger Ring Left  . AMB Referral to [facility]  . HgB A1c  . ECHOCARDIOGRAM LIMITED

## 2020-04-25 NOTE — Progress Notes (Signed)
Asked by Dr. Prince Rome to assist with Medication supply including provision of samples of Tresiba (insulin degludec).   Medication Samples have been provided to the patient.  Drug name: [name] (insulin degludec)     Strength: 100units/ml        Qty: 5 pens (1 month supply)  LOT: [id number]  Exp.Date: 04/2021  Dosing instructions:  As previous, up to 44 units once daily   The patient has been instructed regarding the correct time, dose, and frequency of taking this medication, including desired effects and most common side effects.   Heather [name] 5:03 PM 04/25/2020

## 2020-04-25 NOTE — Patient Instructions (Addendum)
It was a pleasure to see you today!  To summarize our discussion for this visit:  I'm sorry that you are going through so much. I want you to know that I am here to help.  Our pharmacy team is going to be a great resource for getting back on medications. We are giving you tresiba today and you can go back to 44units daily.   I'm happy to report that your A1c improved today to 9.8!  Please go to the imaging center at your convenience for an xray of your knee and finger and an echo when scheduled.   I'd like to see you back in 2 weeks.   Neoma Laming will reach out to you as well for housing assistance.   We can discuss blackseed oil more at our next visit when I've had a chance to look into it more  Some additional health maintenance measures we should update are: Health Maintenance Due  Topic Date Due  . OPHTHALMOLOGY EXAM  10/26/2019  .    Call the clinic at [phone] if your symptoms worsen or you have any concerns.   Thank you for allowing me to take part in your care,  Dr. Doristine Mango

## 2020-04-26 DIAGNOSIS — W108XXA Fall (on) (from) other stairs and steps, initial encounter: Secondary | ICD-10-CM | POA: Insufficient documentation

## 2020-04-26 MED ORDER — OLMESARTAN MEDOXOMIL 5 MG PO TABS: 5.0000 mg | ORAL_TABLET | Freq: Every day | ORAL | 0 refills | Status: DC

## 2020-04-26 MED ORDER — TRESIBA FLEXTOUCH 200 UNIT/ML ~~LOC~~ SOPN: 44.0000 [IU] | PEN_INJECTOR | Freq: Every day | SUBCUTANEOUS | 3 refills | Status: DC

## 2020-04-26 MED ORDER — METFORMIN HCL ER 500 MG PO TB24: 500.0000 mg | ORAL_TABLET | Freq: Two times a day (BID) | ORAL | 0 refills | Status: DC

## 2020-04-26 MED ORDER — BLOOD GLUCOSE MONITOR KIT: PACK | 0 refills | Status: DC

## 2020-04-26 MED ORDER — ATORVASTATIN CALCIUM 40 MG PO TABS: 40.0000 mg | ORAL_TABLET | Freq: Every day | ORAL | 3 refills | Status: DC

## 2020-04-26 MED ORDER — CARVEDILOL 3.125 MG PO TABS: 3.1250 mg | ORAL_TABLET | Freq: Two times a day (BID) | ORAL | 0 refills | Status: DC

## 2020-04-26 NOTE — Assessment & Plan Note (Addendum)
tresiba 44u daily. Sample given at visit today as well as instructions by Dr. Valentina Lucks. A1c today improved to 9.9 Refill metformin and start with 500mg  BID. Will keep 500mg  dose tablets for ease of swallowing and increase to 1,000mg  BID as tolerated. - restart statin - consider GLP-1 (ozempic) at follow up - prescribe new glucometer and testing supplies

## 2020-04-26 NOTE — Assessment & Plan Note (Signed)
Able to ambulate with discomfort. Healing abrasions of knee and upper lip.  -Xray of left knee. Continues to have finger pain, likely arthritis - xray L ring finger

## 2020-04-26 NOTE — Assessment & Plan Note (Signed)
Currently in unsafe living conditions (asked to sleep on mat on the floor and cannot get up in time to use bathroom, asked to pay $75/week in rent and only makes $100/week at job) CCM referral.

## 2020-04-26 NOTE — Assessment & Plan Note (Addendum)
Poorly controlled and not on any medications. Lower extremity edema present and new or worsened systolic murmur on exam. -Consulted pharmacy, CCM for additional resources for medications -Ordered an echo to evaluate cardiac function.  Previous echo 2017 showed EF 60 to 67% - provided with s&s of hypertensive emergency to return to me or ED -Recommend starting an ARB, carvedilol and rechecking in 2 weeks

## 2020-04-27 ENCOUNTER — Telehealth: Payer: Self-pay | Admitting: Pharmacist

## 2020-04-27 NOTE — Telephone Encounter (Signed)
Just spoke with her. I went over [employer] qualifications with her and she mentioned she was told her meds were $0 and the insulin was $9 (even thought she has samples and not picking it up). If that's correct then she most likely has Extra Help/LIS. I dont think she would qualify.

## 2020-04-27 NOTE — Telephone Encounter (Signed)
No prob! 

## 2020-04-27 NOTE — Telephone Encounter (Signed)
I called pharmacy to discuss cost of medications that Dr. Ouida Sills sent yesterday. The pharmacy told me that all of the medications rang up free of charge besides [place], which was $9.85. Will route chart to [facility], [facility], to see if she can assist patient in the long term with PAP for [place].

## 2020-04-27 NOTE — Telephone Encounter (Signed)
Called patient to inform her about medication costs. Patient will present to pharmacy to pick up all medications except [name], as she states she was given samples in clinic at her appt on Monday. Discussed with patient that I will route her chart to [facility] and that she can look into qualifications for PAP for [place].

## 2020-05-02 ENCOUNTER — Telehealth: Payer: Self-pay | Admitting: Student in an Organized Health Care Education/Training Program

## 2020-05-02 NOTE — Telephone Encounter (Signed)
   Telephone encounter was:  Unsuccessful.  05/02/2020 Name: Heather [name] MRN: [id number] DOB: [birth year]  Unsuccessful outbound call made today to assist with:  housing needs.   Outreach Attempt:  1st Attempt  A HIPAA compliant voice message was left requesting a return call.  Instructed patient to call back at [phone]. I am going to send an email anyway to the patient with a list of income based housing and then follow up with patient until I can reach her.   [place], Care Management Phone: [phone] Email: [email or web address]

## 2020-05-05 NOTE — Telephone Encounter (Signed)
Patient calls [facility] retuning Julia's phone call. You can call her on her normal number.

## 2020-05-09 ENCOUNTER — Telehealth: Payer: Self-pay | Admitting: Student in an Organized Health Care Education/Training Program

## 2020-05-09 NOTE — Telephone Encounter (Signed)
   Telephone encounter was:  Unsuccessful.  05/09/2020 Name: SHOSHANA JOHAL MRN: [id number] DOB: 1951-01-22  Unsuccessful outbound call made today to assist with:  Transportation Needs   Outreach Attempt:  2nd Attempt  A HIPAA compliant voice message was left requesting a return call.  Instructed patient to call back at [phone]. I did send an email with all of the types of transportation that she could have.   [place], Care Management Phone: [phone] Email: [email or web address]

## 2020-05-10 ENCOUNTER — Telehealth: Payer: Self-pay | Admitting: Student in an Organized Health Care Education/Training Program

## 2020-05-10 NOTE — Telephone Encounter (Signed)
   Telephone encounter was:  Unsuccessful.  05/10/2020 Name: EVA GRIFFO MRN: [id number] DOB: 1950-12-14  Unsuccessful outbound call made today to assist with:  housing needs  Outreach Attempt:  3rd Attempt.  Referral closed unable to contact patient.  A HIPAA compliant voice message was left requesting a return call.  Instructed patient to call back at [phone].  [place], Care Management Phone: [phone] Email: [email or web address]

## 2020-05-18 ENCOUNTER — Other Ambulatory Visit: Payer: Self-pay | Admitting: Student in an Organized Health Care Education/Training Program

## 2020-05-18 ENCOUNTER — Ambulatory Visit (HOSPITAL_COMMUNITY)
Admission: RE | Admit: 2020-05-18 | Discharge: 2020-05-18 | Disposition: A | Discharge status: Home / Self Care | Payer: Medicare (Managed Care) | Source: Ambulatory Visit | Attending: Family Medicine | Admitting: Family Medicine

## 2020-05-18 ENCOUNTER — Other Ambulatory Visit: Payer: Self-pay

## 2020-05-18 DIAGNOSIS — E785 Hyperlipidemia, unspecified: Secondary | ICD-10-CM | POA: Insufficient documentation

## 2020-05-18 DIAGNOSIS — R011 Cardiac murmur, unspecified: Secondary | ICD-10-CM | POA: Insufficient documentation

## 2020-05-18 DIAGNOSIS — I110 Hypertensive heart disease with heart failure: Secondary | ICD-10-CM | POA: Diagnosis not present

## 2020-05-18 DIAGNOSIS — E119 Type 2 diabetes mellitus without complications: Secondary | ICD-10-CM | POA: Insufficient documentation

## 2020-05-18 LAB — ECHOCARDIOGRAM COMPLETE
Area-P 1/2: 2.56 cm2
S' Lateral: 3 cm

## 2020-05-18 NOTE — Progress Notes (Signed)
  Echocardiogram 2D Echocardiogram has been performed.  Jennette Dubin 05/18/2020, 3:51 PM

## 2020-05-23 ENCOUNTER — Ambulatory Visit (INDEPENDENT_AMBULATORY_CARE_PROVIDER_SITE_OTHER): Payer: Medicare (Managed Care) | Admitting: Student in an Organized Health Care Education/Training Program

## 2020-05-23 ENCOUNTER — Other Ambulatory Visit: Payer: Self-pay

## 2020-05-23 ENCOUNTER — Encounter: Payer: Self-pay | Admitting: Student in an Organized Health Care Education/Training Program

## 2020-05-23 DIAGNOSIS — E1140 Type 2 diabetes mellitus with diabetic neuropathy, unspecified: Secondary | ICD-10-CM

## 2020-05-23 DIAGNOSIS — IMO0002 Reserved for concepts with insufficient information to code with codable children: Secondary | ICD-10-CM

## 2020-05-23 DIAGNOSIS — E1165 Type 2 diabetes mellitus with hyperglycemia: Secondary | ICD-10-CM

## 2020-05-23 DIAGNOSIS — I10 Essential (primary) hypertension: Secondary | ICD-10-CM

## 2020-05-23 DIAGNOSIS — Z794 Long term (current) use of insulin: Secondary | ICD-10-CM | POA: Diagnosis not present

## 2020-05-23 NOTE — Progress Notes (Signed)
   SUBJECTIVE:   CHIEF COMPLAINT / HPI: HTN, DM  HTN- patient has been adherent with her medications. Denies neg side effects. Asymptomatic.   DM- check CBGs once in morning. Fasting typically 100-120. One time below 70, asymptomatic. Drinking glucernas/ensure daily 44 units daily.  OBJECTIVE:   BP 132/75   Pulse 77   Ht 5\' 7"  (1.702 m)   Wt 208 lb 3.2 oz (94.4 kg)   SpO2 96%   BMI 32.61 kg/m   Physical Exam Vitals and nursing note reviewed.  Constitutional:      General: She is not in acute distress.    Appearance: She is obese. She is not ill-appearing or toxic-appearing.  HENT:     Head: Normocephalic.  Cardiovascular:     Rate and Rhythm: Normal rate and regular rhythm.     Pulses: Normal pulses.     Heart sounds: Normal heart sounds.  Pulmonary:     Effort: Pulmonary effort is normal.     Breath sounds: Normal breath sounds.  Abdominal:     General: Abdomen is flat.     Palpations: Abdomen is soft.     Tenderness: There is no abdominal tenderness.  Musculoskeletal:        General: Normal range of motion.     Cervical back: No tenderness.  Lymphadenopathy:     Cervical: No cervical adenopathy.  Skin:    General: Skin is warm and dry.     Capillary Refill: Capillary refill takes less than 2 seconds.  Neurological:     General: No focal deficit present.     Mental Status: She is alert and oriented to person, place, and time.  Psychiatric:        Mood and Affect: Mood normal.        Behavior: Behavior normal.    ASSESSMENT/PLAN:   HTN (hypertension) Well controlled, asymptomatic today - continue current therapy - bring in home meds to next visit - BMP at follow up in 3 months  Uncontrolled diabetes mellitus with diabetic neuropathy, with long-term current use of insulin ([place]) Doing well and endorses adherence with treatments - continue metformin 1,000mg  BID - decrease insulin to 40u daily to avoid hypoglycemia - lipid panel at follow up    [place]

## 2020-05-23 NOTE — Patient Instructions (Signed)
It was a pleasure to see you today!  To summarize our discussion for this visit:  Your blood pressure looks so great!!!!! We will continue your current medications  For your diabetes- we are going to decrease your insulin to 40 units daily and keep taking 1,000mg  metformin twice per day  Please bring in your medications with you at our next visit to go over together.   Some additional health maintenance measures we should update are: Health Maintenance Due  Topic Date Due  . OPHTHALMOLOGY EXAM  10/26/2019  .    Please return to our clinic to see me in about 2-3 months.  Call the clinic at [phone] if your symptoms worsen or you have any concerns.   Thank you for allowing me to take part in your care,  Dr. Doristine Mango

## 2020-05-24 NOTE — Assessment & Plan Note (Signed)
Well controlled, asymptomatic today - continue current therapy - bring in home meds to next visit - BMP at follow up in 3 months

## 2020-05-24 NOTE — Assessment & Plan Note (Signed)
Doing well and endorses adherence with treatments - continue metformin 1,000mg  BID - decrease insulin to 40u daily to avoid hypoglycemia - lipid panel at follow up

## 2020-07-07 ENCOUNTER — Telehealth: Payer: Self-pay | Admitting: *Deleted

## 2020-07-07 NOTE — Telephone Encounter (Signed)
Rx request omega-3 acid ethyl esters caps 1gm. Please advise.Waverly Tarquinio Kennon Holter, CMA

## 2020-07-08 ENCOUNTER — Other Ambulatory Visit: Payer: Self-pay | Admitting: Student in an Organized Health Care Education/Training Program

## 2020-07-18 ENCOUNTER — Other Ambulatory Visit: Payer: Self-pay | Admitting: Student in an Organized Health Care Education/Training Program

## 2020-07-23 ENCOUNTER — Other Ambulatory Visit: Payer: Self-pay | Admitting: Student in an Organized Health Care Education/Training Program

## 2020-08-02 ENCOUNTER — Other Ambulatory Visit: Payer: Self-pay

## 2020-08-02 ENCOUNTER — Ambulatory Visit (INDEPENDENT_AMBULATORY_CARE_PROVIDER_SITE_OTHER): Payer: Medicare (Managed Care) | Admitting: Student in an Organized Health Care Education/Training Program

## 2020-08-02 VITALS — BP 165/90 | HR 80 | Ht 67.0 in | Wt 214.4 lb

## 2020-08-02 DIAGNOSIS — E782 Mixed hyperlipidemia: Secondary | ICD-10-CM

## 2020-08-02 DIAGNOSIS — Z794 Long term (current) use of insulin: Secondary | ICD-10-CM | POA: Diagnosis not present

## 2020-08-02 DIAGNOSIS — E1140 Type 2 diabetes mellitus with diabetic neuropathy, unspecified: Secondary | ICD-10-CM

## 2020-08-02 DIAGNOSIS — I10 Essential (primary) hypertension: Secondary | ICD-10-CM

## 2020-08-02 DIAGNOSIS — E1165 Type 2 diabetes mellitus with hyperglycemia: Secondary | ICD-10-CM

## 2020-08-02 DIAGNOSIS — Z59819 Housing instability, housed unspecified: Secondary | ICD-10-CM

## 2020-08-02 DIAGNOSIS — Z599 Problem related to housing and economic circumstances, unspecified: Secondary | ICD-10-CM | POA: Diagnosis not present

## 2020-08-02 DIAGNOSIS — E1169 Type 2 diabetes mellitus with other specified complication: Secondary | ICD-10-CM | POA: Diagnosis not present

## 2020-08-02 DIAGNOSIS — E785 Hyperlipidemia, unspecified: Secondary | ICD-10-CM

## 2020-08-02 DIAGNOSIS — IMO0002 Reserved for concepts with insufficient information to code with codable children: Secondary | ICD-10-CM

## 2020-08-02 LAB — POCT GLYCOSYLATED HEMOGLOBIN (HGB A1C): HbA1c, POC (controlled diabetic range): 8.1 % — AB (ref 0.0–7.0)

## 2020-08-02 NOTE — Assessment & Plan Note (Signed)
Endorses poor diet due to housing instability is contributing to her weight gain. She has not been taking her diabetes medications other than insulin and at a lower dose than previously.  She states she has been adherent with taking 32 units of insulin daily and has not checked her blood sugars at home at all. Her A1c is actually improved today despite poor adherence and diet to 8.4. We discussed lifestyle changes and restarting her other medications to reach a goal of A1c less than 8 for the decreased risk of stroke and heart attack benefit and she states that she will try.

## 2020-08-02 NOTE — Patient Instructions (Signed)
It was a pleasure to see you today!  To summarize our discussion for this visit:  It was a pleasure working with your these past years. I hope you continue to do well and take good care of your self and family.   I will contact you with your lab results.   Please be on the look out for the foot doctor to contact you.   Some additional health maintenance measures we should update are: Health Maintenance Due  Topic Date Due  . Pneumococcal Vaccine 38-1 Years old (1 of 2 - PPSV23) Never done  . Zoster Vaccines- Shingrix (1 of 2) Never done  . OPHTHALMOLOGY EXAM  10/26/2019  . FOOT EXAM  06/27/2020  .    Call the clinic at [phone] if your symptoms worsen or you have any concerns.   Thank you for allowing me to take part in your care,  Dr. Doristine Mango

## 2020-08-02 NOTE — Assessment & Plan Note (Signed)
This appears to remain the patient's most challenging problem today. She has back to working full-time since it is the summer break and feels that she is in a safe comfortable home with the new relative for the past 2 weeks.  She continues to look for a place of her own that would be affordable.

## 2020-08-02 NOTE — Assessment & Plan Note (Signed)
Repeat lipid panel today. Patient states that she will try to find her medications and her trunk and start taking her atorvastatin.

## 2020-08-02 NOTE — Progress Notes (Signed)
SUBJECTIVE:   CHIEF COMPLAINT / HPI: BP, DM check up  Housing instability- staying with a new relative and it is very comfortable. Has been here for 2 weeks. 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  DM- only taking insulin. Rest of her medications are in her car trunk. 32u every morning. Not checking her blood sugars at home. No symptoms of high or low blood sugars. A1c improved today to 8.4. she has gained about 6 pounds since last visit. She relates this to poor eating habits from her last living situation.   HTN- poorly controlled today due to not taking her medications.  OBJECTIVE:   BP (!) 165/90   Pulse 80   Ht 5\' 7"  (1.702 m)   Wt 214 lb 6.4 oz (97.3 kg)   SpO2 96%   BMI 33.58 kg/m   Physical Exam Vitals and nursing note reviewed.  Constitutional:      General: She is not in acute distress.    Appearance: She is not ill-appearing or toxic-appearing.  HENT:     Nose: Nose normal.     Mouth/Throat:     Mouth: Mucous membranes are moist.  Eyes:     Conjunctiva/sclera: Conjunctivae normal.  Cardiovascular:     Rate and Rhythm: Normal rate and regular rhythm.     Pulses: Normal pulses.     Heart sounds: Normal heart sounds.  Pulmonary:     Effort: Pulmonary effort is normal.     Breath sounds: Normal breath sounds.  Musculoskeletal:     Cervical back: No tenderness.     Right lower leg: Edema (trace) present.     Left lower leg: Edema (trace) present.  Lymphadenopathy:     Cervical: No cervical adenopathy.  Skin:    Capillary Refill: Capillary refill takes less than 2 seconds.  Neurological:     General: No focal deficit present.     Mental Status: She is alert and oriented to person, place, and time.  Psychiatric:        Mood and Affect: Mood normal.        Behavior: Behavior normal.    ASSESSMENT/PLAN:   Housing instability This appears to remain the patient's most challenging problem today. She has back  to working full-time since it is the summer break and feels that she is in a safe comfortable home with the new relative for the past 2 weeks.  She continues to look for a place of her own that would be affordable.  HLD (hyperlipidemia) Repeat lipid panel today. Patient states that she will try to find her medications and her trunk and start taking her atorvastatin.  Uncontrolled diabetes mellitus with diabetic neuropathy, with long-term current use of insulin ([place]) Endorses poor diet due to housing instability is contributing to her weight gain. She has not been taking her diabetes medications other than insulin and at a lower dose than previously.  She states she has been adherent with taking 32 units of insulin daily and has not checked her blood sugars at home at all. Her A1c is actually improved today despite poor adherence and diet to 8.4. We discussed lifestyle changes and restarting her other medications to reach a goal of A1c less than 8 for the decreased risk of stroke and heart attack benefit and she states that she will try.  HTN (hypertension) Poorly controlled but asymptomatic Nonadherent with medications CMP, CBC today     Abdel Effinger L  Ouida Sills [place]

## 2020-08-02 NOTE — Assessment & Plan Note (Signed)
Poorly controlled but asymptomatic Nonadherent with medications CMP, CBC today

## 2020-08-03 ENCOUNTER — Telehealth: Payer: Self-pay | Admitting: *Deleted

## 2020-08-03 LAB — CBC
Hematocrit: 36.2 % (ref 34.0–46.6)
Hemoglobin: 11.6 g/dL (ref 11.1–15.9)
MCH: 27 pg (ref 26.6–33.0)
MCHC: 32 g/dL (ref 31.5–35.7)
MCV: 84 fL (ref 79–97)
Platelets: 278 10*3/uL (ref 150–450)
RBC: 4.29 x10E6/uL (ref 3.77–5.28)
RDW: 14.3 % (ref 11.7–15.4)
WBC: 8.7 10*3/uL (ref 3.4–10.8)

## 2020-08-03 LAB — COMPREHENSIVE METABOLIC PANEL
ALT: 17 IU/L (ref 0–32)
AST: 20 IU/L (ref 0–40)
Albumin/Globulin Ratio: 1.1 — ABNORMAL LOW (ref 1.2–2.2)
Albumin: 3.7 g/dL — ABNORMAL LOW (ref 3.8–4.8)
Alkaline Phosphatase: 91 IU/L (ref 44–121)
BUN/Creatinine Ratio: 18 (ref 12–28)
BUN: 23 mg/dL (ref 8–27)
Bilirubin Total: 0.3 mg/dL (ref 0.0–1.2)
CO2: 22 mmol/L (ref 20–29)
Calcium: 9.2 mg/dL (ref 8.7–10.3)
Chloride: 106 mmol/L (ref 96–106)
Creatinine, Ser: 1.3 mg/dL — ABNORMAL HIGH (ref 0.57–1.00)
Globulin, Total: 3.4 g/dL (ref 1.5–4.5)
Glucose: 101 mg/dL — ABNORMAL HIGH (ref 65–99)
Potassium: 3.9 mmol/L (ref 3.5–5.2)
Sodium: 142 mmol/L (ref 134–144)
Total Protein: 7.1 g/dL (ref 6.0–8.5)
eGFR: 44 mL/min/{1.73_m2} — ABNORMAL LOW (ref >59)

## 2020-08-03 LAB — LIPID PANEL
Chol/HDL Ratio: 3.8 ratio (ref 0.0–4.4)
Cholesterol, Total: 259 mg/dL — ABNORMAL HIGH (ref 100–199)
HDL: 69 mg/dL (ref >39)
LDL Chol Calc (NIH): 166 mg/dL — ABNORMAL HIGH (ref 0–99)
Triglycerides: 134 mg/dL (ref 0–149)
VLDL Cholesterol Cal: 24 mg/dL (ref 5–40)

## 2020-08-03 NOTE — Telephone Encounter (Signed)
I received a voicemail from patient requesting her podiatry referral be sent to another office as [facility] doesn't take her insurance.  I tried to look online but since we don't have a copy of her card on file, I am unable to pull up potential offices.  I left a message for patient asking her to contact [facility] and see who is in network and that I would be happy to send the information to them.  Or she can send [place] a picture of her insurance card, so we have it on file.  Olyvia Gopal,CMA

## 2020-08-04 ENCOUNTER — Encounter: Payer: Self-pay | Admitting: Student in an Organized Health Care Education/Training Program

## 2020-08-14 ENCOUNTER — Encounter (HOSPITAL_COMMUNITY): Payer: Self-pay

## 2020-08-14 ENCOUNTER — Emergency Department (HOSPITAL_COMMUNITY): Payer: Medicare (Managed Care)

## 2020-08-14 ENCOUNTER — Other Ambulatory Visit: Payer: Self-pay

## 2020-08-14 ENCOUNTER — Inpatient Hospital Stay (HOSPITAL_COMMUNITY)
Admission: EM | Admit: 2020-08-14 | Discharge: 2020-08-16 | DRG: 247 | Disposition: A | Discharge status: Home / Self Care | Payer: Medicare (Managed Care) | Attending: Cardiology | Admitting: Cardiology

## 2020-08-14 DIAGNOSIS — I2583 Coronary atherosclerosis due to lipid rich plaque: Secondary | ICD-10-CM | POA: Diagnosis not present

## 2020-08-14 DIAGNOSIS — Z20822 Contact with and (suspected) exposure to covid-19: Secondary | ICD-10-CM | POA: Diagnosis present

## 2020-08-14 DIAGNOSIS — Z7984 Long term (current) use of oral hypoglycemic drugs: Secondary | ICD-10-CM

## 2020-08-14 DIAGNOSIS — Z7982 Long term (current) use of aspirin: Secondary | ICD-10-CM

## 2020-08-14 DIAGNOSIS — N179 Acute kidney failure, unspecified: Secondary | ICD-10-CM | POA: Diagnosis present

## 2020-08-14 DIAGNOSIS — I129 Hypertensive chronic kidney disease with stage 1 through stage 4 chronic kidney disease, or unspecified chronic kidney disease: Secondary | ICD-10-CM | POA: Diagnosis present

## 2020-08-14 DIAGNOSIS — I2510 Atherosclerotic heart disease of native coronary artery without angina pectoris: Secondary | ICD-10-CM

## 2020-08-14 DIAGNOSIS — I10 Essential (primary) hypertension: Secondary | ICD-10-CM

## 2020-08-14 DIAGNOSIS — E7800 Pure hypercholesterolemia, unspecified: Secondary | ICD-10-CM | POA: Diagnosis not present

## 2020-08-14 DIAGNOSIS — E1140 Type 2 diabetes mellitus with diabetic neuropathy, unspecified: Secondary | ICD-10-CM | POA: Diagnosis present

## 2020-08-14 DIAGNOSIS — E118 Type 2 diabetes mellitus with unspecified complications: Secondary | ICD-10-CM

## 2020-08-14 DIAGNOSIS — I4891 Unspecified atrial fibrillation: Secondary | ICD-10-CM | POA: Diagnosis not present

## 2020-08-14 DIAGNOSIS — Z6832 Body mass index (BMI) 32.0-32.9, adult: Secondary | ICD-10-CM

## 2020-08-14 DIAGNOSIS — Z8249 Family history of ischemic heart disease and other diseases of the circulatory system: Secondary | ICD-10-CM

## 2020-08-14 DIAGNOSIS — Z9114 Patient's other noncompliance with medication regimen: Secondary | ICD-10-CM | POA: Diagnosis not present

## 2020-08-14 DIAGNOSIS — E1122 Type 2 diabetes mellitus with diabetic chronic kidney disease: Secondary | ICD-10-CM | POA: Diagnosis present

## 2020-08-14 DIAGNOSIS — I249 Acute ischemic heart disease, unspecified: Secondary | ICD-10-CM | POA: Diagnosis not present

## 2020-08-14 DIAGNOSIS — E1169 Type 2 diabetes mellitus with other specified complication: Secondary | ICD-10-CM | POA: Diagnosis present

## 2020-08-14 DIAGNOSIS — Z794 Long term (current) use of insulin: Secondary | ICD-10-CM | POA: Diagnosis not present

## 2020-08-14 DIAGNOSIS — E1165 Type 2 diabetes mellitus with hyperglycemia: Secondary | ICD-10-CM | POA: Diagnosis present

## 2020-08-14 DIAGNOSIS — Z59819 Housing instability, housed unspecified: Secondary | ICD-10-CM | POA: Diagnosis not present

## 2020-08-14 DIAGNOSIS — I214 Non-ST elevation (NSTEMI) myocardial infarction: Secondary | ICD-10-CM | POA: Diagnosis not present

## 2020-08-14 DIAGNOSIS — I480 Paroxysmal atrial fibrillation: Secondary | ICD-10-CM | POA: Diagnosis not present

## 2020-08-14 DIAGNOSIS — Z955 Presence of coronary angioplasty implant and graft: Secondary | ICD-10-CM

## 2020-08-14 DIAGNOSIS — E785 Hyperlipidemia, unspecified: Secondary | ICD-10-CM | POA: Diagnosis present

## 2020-08-14 DIAGNOSIS — Z9119 Patient's noncompliance with other medical treatment and regimen: Secondary | ICD-10-CM

## 2020-08-14 DIAGNOSIS — E1139 Type 2 diabetes mellitus with other diabetic ophthalmic complication: Secondary | ICD-10-CM

## 2020-08-14 DIAGNOSIS — Z79899 Other long term (current) drug therapy: Secondary | ICD-10-CM | POA: Diagnosis not present

## 2020-08-14 DIAGNOSIS — E1159 Type 2 diabetes mellitus with other circulatory complications: Secondary | ICD-10-CM | POA: Diagnosis present

## 2020-08-14 DIAGNOSIS — I152 Hypertension secondary to endocrine disorders: Secondary | ICD-10-CM | POA: Diagnosis present

## 2020-08-14 DIAGNOSIS — I25110 Atherosclerotic heart disease of native coronary artery with unstable angina pectoris: Secondary | ICD-10-CM

## 2020-08-14 DIAGNOSIS — Z833 Family history of diabetes mellitus: Secondary | ICD-10-CM | POA: Diagnosis not present

## 2020-08-14 DIAGNOSIS — N1831 Chronic kidney disease, stage 3a: Secondary | ICD-10-CM | POA: Diagnosis present

## 2020-08-14 DIAGNOSIS — R079 Chest pain, unspecified: Secondary | ICD-10-CM

## 2020-08-14 LAB — CBC WITH DIFFERENTIAL/PLATELET
Abs Immature Granulocytes: 0.02 10*3/uL (ref 0.00–0.07)
Basophils Absolute: 0 10*3/uL (ref 0.0–0.1)
Basophils Relative: 1 %
Eosinophils Absolute: 0.2 10*3/uL (ref 0.0–0.5)
Eosinophils Relative: 2 %
HCT: 37.9 % (ref 36.0–46.0)
Hemoglobin: 11.8 g/dL — ABNORMAL LOW (ref 12.0–15.0)
Immature Granulocytes: 0 %
Lymphocytes Relative: 19 %
Lymphs Abs: 1.4 10*3/uL (ref 0.7–4.0)
MCH: 27.9 pg (ref 26.0–34.0)
MCHC: 31.1 g/dL (ref 30.0–36.0)
MCV: 89.6 fL (ref 80.0–100.0)
Monocytes Absolute: 0.3 10*3/uL (ref 0.1–1.0)
Monocytes Relative: 4 %
Neutro Abs: 5.6 10*3/uL (ref 1.7–7.7)
Neutrophils Relative %: 74 %
Platelets: 216 10*3/uL (ref 150–400)
RBC: 4.23 MIL/uL (ref 3.87–5.11)
RDW: 15.3 % (ref 11.5–15.5)
WBC: 7.5 10*3/uL (ref 4.0–10.5)
nRBC: 0 % (ref 0.0–0.2)

## 2020-08-14 LAB — HEMOGLOBIN A1C
Hgb A1c MFr Bld: 8.1 % — ABNORMAL HIGH (ref 4.8–5.6)
Mean Plasma Glucose: 186 mg/dL

## 2020-08-14 LAB — RESP PANEL BY RT-PCR (FLU A&B, COVID) ARPGX2
Influenza A by PCR: NEGATIVE
Influenza B by PCR: NEGATIVE
SARS Coronavirus 2 by RT PCR: NEGATIVE

## 2020-08-14 LAB — CBG MONITORING, ED
Glucose-Capillary: 277 mg/dL — ABNORMAL HIGH (ref 70–99)
Glucose-Capillary: 153 mg/dL — ABNORMAL HIGH (ref 70–99)

## 2020-08-14 LAB — TROPONIN I (HIGH SENSITIVITY)
Troponin I (High Sensitivity): 114 ng/L (ref <18)
Troponin I (High Sensitivity): 476 ng/L (ref <18)
Troponin I (High Sensitivity): 1506 ng/L (ref <18)
Troponin I (High Sensitivity): 1029 ng/L (ref <18)

## 2020-08-14 LAB — I-STAT CHEM 8, ED
BUN: 51 mg/dL — ABNORMAL HIGH (ref 8–23)
Calcium, Ion: 1.12 mmol/L — ABNORMAL LOW (ref 1.15–1.40)
Chloride: 110 mmol/L (ref 98–111)
Creatinine, Ser: 1.4 mg/dL — ABNORMAL HIGH (ref 0.44–1.00)
Glucose, Bld: 312 mg/dL — ABNORMAL HIGH (ref 70–99)
HCT: 32 % — ABNORMAL LOW (ref 36.0–46.0)
Hemoglobin: 10.9 g/dL — ABNORMAL LOW (ref 12.0–15.0)
Potassium: 4.8 mmol/L (ref 3.5–5.1)
Sodium: 140 mmol/L (ref 135–145)
TCO2: 22 mmol/L (ref 22–32)

## 2020-08-14 LAB — BRAIN NATRIURETIC PEPTIDE: B Natriuretic Peptide: 66.1 pg/mL (ref 0.0–100.0)

## 2020-08-14 LAB — GLUCOSE, CAPILLARY
Glucose-Capillary: 204 mg/dL — ABNORMAL HIGH (ref 70–99)
Glucose-Capillary: 149 mg/dL — ABNORMAL HIGH (ref 70–99)

## 2020-08-14 LAB — HEPARIN LEVEL (UNFRACTIONATED)
Heparin Unfractionated: <0.1 IU/mL — ABNORMAL LOW (ref 0.30–0.70)
Heparin Unfractionated: 0.3 IU/mL (ref 0.30–0.70)

## 2020-08-14 LAB — HIV ANTIBODY (ROUTINE TESTING W REFLEX): HIV Screen 4th Generation wRfx: NONREACTIVE

## 2020-08-14 MED ORDER — HEPARIN (PORCINE) 25000 UT/250ML-% IV SOLN
1200.0000 [IU]/h | INTRAVENOUS | Status: DC
  Administered 2020-08-14: 1050 [IU]/h via INTRAVENOUS
  Administered 2020-08-14: 1000 [IU]/h via INTRAVENOUS
  Filled 2020-08-14 (×2): qty 250

## 2020-08-14 MED ORDER — SODIUM CHLORIDE 0.9% FLUSH: 3.0000 mL | INTRAVENOUS | Status: DC | PRN

## 2020-08-14 MED ORDER — METOPROLOL TARTRATE 25 MG PO TABS
12.5000 mg | ORAL_TABLET | Freq: Two times a day (BID) | ORAL | Status: DC
  Administered 2020-08-14: 12.5 mg via ORAL
  Filled 2020-08-14: qty 1

## 2020-08-14 MED ORDER — HEPARIN (PORCINE) 25000 UT/250ML-% IV SOLN: 12.0000 [IU]/kg/h | INTRAVENOUS | Status: DC

## 2020-08-14 MED ORDER — SODIUM CHLORIDE 0.9% FLUSH
3.0000 mL | Freq: Two times a day (BID) | INTRAVENOUS | Status: DC
  Administered 2020-08-14 – 2020-08-15 (×3): 3 mL via INTRAVENOUS

## 2020-08-14 MED ORDER — METOPROLOL TARTRATE 25 MG PO TABS
25.0000 mg | ORAL_TABLET | Freq: Two times a day (BID) | ORAL | Status: DC
  Administered 2020-08-14 – 2020-08-16 (×4): 25 mg via ORAL
  Filled 2020-08-14 (×5): qty 1

## 2020-08-14 MED ORDER — ONDANSETRON HCL 4 MG/2ML IJ SOLN: 4.0000 mg | Freq: Four times a day (QID) | INTRAMUSCULAR | Status: DC | PRN

## 2020-08-14 MED ORDER — SODIUM CHLORIDE 0.9 % WEIGHT BASED INFUSION: 1.0000 mL/kg/h | INTRAVENOUS | Status: DC

## 2020-08-14 MED ORDER — SODIUM CHLORIDE 0.9 % WEIGHT BASED INFUSION: 3.0000 mL/kg/h | INTRAVENOUS | Status: AC

## 2020-08-14 MED ORDER — HEPARIN BOLUS VIA INFUSION
4000.0000 [IU] | Freq: Once | INTRAVENOUS | Status: AC
  Administered 2020-08-14: 4000 [IU] via INTRAVENOUS
  Filled 2020-08-14: qty 4000

## 2020-08-14 MED ORDER — ATORVASTATIN CALCIUM 80 MG PO TABS
80.0000 mg | ORAL_TABLET | Freq: Every day | ORAL | Status: DC
  Administered 2020-08-14 – 2020-08-16 (×3): 80 mg via ORAL
  Filled 2020-08-14 (×2): qty 1
  Filled 2020-08-14: qty 2
  Filled 2020-08-14: qty 1

## 2020-08-14 MED ORDER — DILTIAZEM HCL-DEXTROSE 125-5 MG/125ML-% IV SOLN (PREMIX)
5.0000 mg/h | INTRAVENOUS | Status: DC
Start: 2020-08-14 — End: 2020-08-14

## 2020-08-14 MED ORDER — INSULIN ASPART 100 UNIT/ML IJ SOLN
0.0000 [IU] | Freq: Three times a day (TID) | INTRAMUSCULAR | Status: DC
  Administered 2020-08-14: 3 [IU] via SUBCUTANEOUS
  Administered 2020-08-14: 1 [IU] via SUBCUTANEOUS

## 2020-08-14 MED ORDER — ASPIRIN 81 MG PO CHEW
81.0000 mg | CHEWABLE_TABLET | ORAL | Status: AC
  Administered 2020-08-15: 81 mg via ORAL
  Filled 2020-08-14: qty 1

## 2020-08-14 MED ORDER — ASPIRIN 300 MG RE SUPP: 300.0000 mg | RECTAL | Status: AC

## 2020-08-14 MED ORDER — ASPIRIN EC 81 MG PO TBEC
81.0000 mg | DELAYED_RELEASE_TABLET | Freq: Every day | ORAL | Status: DC
  Administered 2020-08-16: 81 mg via ORAL
  Filled 2020-08-14: qty 1

## 2020-08-14 MED ORDER — SODIUM CHLORIDE 0.9 % IV SOLN: INTRAVENOUS | Status: DC

## 2020-08-14 MED ORDER — NITROGLYCERIN 0.4 MG SL SUBL: 0.4000 mg | SUBLINGUAL_TABLET | SUBLINGUAL | Status: DC | PRN

## 2020-08-14 MED ORDER — SODIUM CHLORIDE 0.9 % IV SOLN: 250.0000 mL | INTRAVENOUS | Status: DC | PRN

## 2020-08-14 MED ORDER — INSULIN ASPART 100 UNIT/ML IJ SOLN
6.0000 [IU] | Freq: Once | INTRAMUSCULAR | Status: AC
  Administered 2020-08-14: 6 [IU] via SUBCUTANEOUS

## 2020-08-14 MED ORDER — ASPIRIN 81 MG PO CHEW
324.0000 mg | CHEWABLE_TABLET | Freq: Once | ORAL | Status: AC
  Administered 2020-08-14: 324 mg via ORAL
  Filled 2020-08-14: qty 4

## 2020-08-14 MED ORDER — METOPROLOL TARTRATE 12.5 MG HALF TABLET: 12.5000 mg | ORAL_TABLET | Freq: Once | ORAL | Status: DC

## 2020-08-14 MED ORDER — INSULIN GLARGINE 100 UNIT/ML ~~LOC~~ SOLN
10.0000 [IU] | Freq: Every day | SUBCUTANEOUS | Status: DC
  Administered 2020-08-14 – 2020-08-15 (×2): 10 [IU] via SUBCUTANEOUS
  Filled 2020-08-14 (×4): qty 0.1

## 2020-08-14 MED ORDER — DILTIAZEM LOAD VIA INFUSION
10.0000 mg | Freq: Once | INTRAVENOUS | Status: DC
  Filled 2020-08-14: qty 10

## 2020-08-14 MED ORDER — CLOPIDOGREL BISULFATE 75 MG PO TABS: 75.0000 mg | ORAL_TABLET | Freq: Every day | ORAL | Status: DC

## 2020-08-14 MED ORDER — ASPIRIN 81 MG PO CHEW
324.0000 mg | CHEWABLE_TABLET | ORAL | Status: AC
  Administered 2020-08-14: 324 mg via ORAL
  Filled 2020-08-14: qty 4

## 2020-08-14 MED ORDER — NITROGLYCERIN 2 % TD OINT
0.5000 [in_us] | TOPICAL_OINTMENT | Freq: Once | TRANSDERMAL | Status: AC
  Administered 2020-08-14: 0.5 [in_us] via TOPICAL
  Filled 2020-08-14: qty 1

## 2020-08-14 MED ORDER — ACETAMINOPHEN 325 MG PO TABS: 650.0000 mg | ORAL_TABLET | ORAL | Status: DC | PRN

## 2020-08-14 MED ORDER — NITROGLYCERIN 2 % TD OINT
0.5000 [in_us] | TOPICAL_OINTMENT | Freq: Four times a day (QID) | TRANSDERMAL | Status: DC
  Administered 2020-08-14 – 2020-08-15 (×3): 0.5 [in_us] via TOPICAL
  Filled 2020-08-14: qty 1
  Filled 2020-08-14: qty 30

## 2020-08-14 NOTE — ED Notes (Signed)
Md Palumbo notified of pts trop of 114

## 2020-08-14 NOTE — ED Notes (Signed)
[phone] pt cousin would like an update. She got picked up by EMS from cousins house

## 2020-08-14 NOTE — Progress Notes (Signed)
Progress Note  Patient Name: Heather [name] Date of Encounter: 08/14/2020  [facility] Cardiologist: Heather Grooms, MD  Subjective   Resume cath from Dr. Terrence [name] as patient belongs to Dr. Irish [name]. No recurrent chest pain.  Breathing stable.  Inpatient Medications    Scheduled Meds:  diltiazem  10 mg Intravenous Once   Continuous Infusions:  heparin 1,000 Units/hr (08/14/20 0211)   PRN Meds:    Vital Signs    Vitals:   08/14/20 0645 08/14/20 0700 08/14/20 0715 08/14/20 0740  BP: 138/70 (!) 143/76 (!) 163/74   Pulse: 64 (!) 57 66   Resp: 19 20 (!) 24   Temp:    98.1 F (36.7 C)  TempSrc:    Oral  SpO2: 100% 97% 96%   Weight:      Height:       No intake or output data in the 24 hours ending 08/14/20 0917 Last 3 Weights 08/14/2020 08/02/2020 05/23/2020  Weight (lbs) 210 lb 214 lb 6.4 oz 208 lb 3.2 oz  Weight (kg) 95.255 kg 97.251 kg 94.439 kg  Some encounter information is confidential and restricted. Go to Review Flowsheets activity to see all data.      Telemetry    Sinus rhythm at controlled ventricular rate- Personally Reviewed  ECG    Sinus rhythm, PAC- Personally Reviewed  Physical Exam   GEN: No acute distress.   Neck: No JVD Cardiac: RRR, no murmurs, rubs, or gallops.  Respiratory: Clear to auscultation bilaterally. GI: Soft, nontender, non-distended  MS: No edema; No deformity. Neuro:  Nonfocal  Psych: Normal affect   Labs    High Sensitivity Troponin:   Recent Labs  Lab 08/14/20 0149 08/14/20 0349  TROPONINIHS 114* 476*      Chemistry Recent Labs  Lab 08/14/20 0244  NA 140  K 4.8  CL 110  GLUCOSE 312*  BUN 51*  CREATININE 1.40*     Hematology Recent Labs  Lab 08/14/20 0149 08/14/20 0244  WBC 7.5  --   RBC 4.23  --   HGB 11.8* 10.9*  HCT 37.9 32.0*  MCV 89.6  --   MCH 27.9  --   MCHC 31.1  --   RDW 15.3  --   PLT 216  --     BNP Recent Labs  Lab 08/14/20 0204  BNP 66.1     Radiology    DG Chest  Portable 1 View  Result Date: 08/14/2020 CLINICAL DATA:  Shortness of breath EXAM: PORTABLE CHEST 1 VIEW COMPARISON:  02/12/2019 FINDINGS: The heart size and mediastinal contours are within normal limits. Both lungs are clear. The visualized skeletal structures are unremarkable. IMPRESSION: No active disease. Electronically Signed   By: Heather [name] M.D.   On: 08/14/2020 02:29    Cardiac Studies   None  Patient Profile     70 y.o. female with history of nonobstructive CAD, hypertension, hyperlipidemia, diabetes mellitus, morbid obesity and noncompliance with medication.  Patient had a sudden onset chest pain and palpitation around 10 PM last night.  Chest pain felt like gas/burping sensation with shortness of breath and diaphoresis.  EMS was called around midnight and patient was found to be in A. fib with RVR.  She was given aspirin 324 mg and sublingual nitroglycerin x1.  Chest pain improved patient spontaneously converted prior to ER arrival.  Pending EMS description in media. Patient denies regular exercise or prior chest pain/palpitation.  Assessment & Plan    1.  Chest pain/elevated  troponin -Chest pain in setting of A. fib RVR.  High-sensitivity troponin 114>476.  -This could be demand ischemia in setting of elevated heart rate. -Catheterization in 2017 showed 50% ostial RCA and proximal RCA stenosis>> medical therapy recommended but patient is noncompliant. -No regular exercise -Significant cardiac risk factor - On nitro patch  -Will review plan with MD.  Keep NPO. -Start aspirin 81 mg daily, Lipitor 80 mg daily and beta-blocker -Echocardiogram March 2022 with LV function of 60 to 65% and grade 1 diastolic dysfunction.  No significant valvular abnormality.  2.  New onset atrial fibrillation with rapid ventricular rate  -Felt chest pain and palpitation last night around 10 PM.  Spontaneously converted after aspirin and nitroglycerin in route.  Currently maintaining sinus  rhythm. -CHA2DS2-VASc Score = 5  The patient's score is based upon: CHF History: No HTN History: Yes Diabetes History: Yes Stroke History: No Vascular Disease History: Yes Age Score: 1 Gender Score: 1  -Patient will need anticoagulation. -Start beta-blocker  3.  Hypertension -Blood pressure elevated -Will start antihypertensive regimen  4. HLD - 08/02/2020: Cholesterol, Total 259; HDL 69; LDL Chol Calc (NIH) 166; Triglycerides 134  - Add Lipitor 80mg  qd  5. DM - HgbA1c was 8.1 about week ago - She was taking insulin at home - SSI while admitted  6.  Social issue -Patient has housing instability and currently lives with new relative.  7. Acute on presume CKD IIIa - Scr in range of 1.1-1.3 previously - Seems stable   I have at length discussion about medication compliance.  Patient is willing to take her medications.  Will consult social worker and case Freight forwarder.   For questions or updates, please contact [place] Please consult [email or web address] for contact info under        SignedLeanor Kail, PA  08/14/2020, 9:17 AM

## 2020-08-14 NOTE — ED Notes (Signed)
Nurse is unable to take report at this time

## 2020-08-14 NOTE — Progress Notes (Addendum)
ANTICOAGULATION CONSULT NOTE - Initial Consult  Pharmacy Consult for Heparin  Indication: atrial fibrillation  No Known Allergies  Patient Measurements: Height: 5\' 7"  (170.2 cm) Weight: 95.3 kg (210 lb) IBW/kg (Calculated) : 61.6  Vital Signs: Temp: 97.8 F (36.6 C) (06/20 0220) Temp Source: Oral (06/20 0220) BP: 150/78 (06/20 0230) Pulse Rate: 85 (06/20 0230)  Labs: Recent Labs    08/14/20 0149 08/14/20 0244  HGB 11.8* 10.9*  HCT 37.9 32.0*  PLT 216  --   CREATININE  --  1.40*    Estimated Creatinine Clearance: 44.3 mL/min (A) (by C-G formula based on SCr of 1.4 mg/dL (H)).   Medical History: Past Medical History:  Diagnosis Date   Abscess, gluteal, right 05/25/2016   Diabetes mellitus    Encounter for hepatitis C screening test for low risk patient 04/13/2018   Screening 04/13/2018   Food insecurity 04/13/2018   Hyperlipidemia    Hypertension    Medicare annual wellness visit, initial 04/13/2018   Patient is able to have AWV initial   Neuropathy    Other chest pain 01/01/2016   Screening for breast cancer 04/13/2018   Sepsis ([place]) 05/25/2016   Tibial plateau fracture, left 02/16/2015   Uncontrolled diabetes mellitus with diabetic neuropathy, with long-term current use of insulin ([place]) 01/01/2016     Assessment: 70 y/o F with new onset afib. History of CAD. Starting heparin per pharmacy. Hgb 10.9. Scr 1.4. PTA meds reviewed.   Goal of Therapy:  Heparin level 0.3-0.7 units/ml Monitor platelets by anticoagulation protocol: Yes   Plan:  Heparin 4000 units BOLUS Start heparin drip at 1000 units/hr 1000 Heparin level Daily CBC/Heparin level Monitor for bleeding  Narda Bonds, PharmD, BCPS Clinical Pharmacist Phone: [phone]

## 2020-08-14 NOTE — ED Notes (Addendum)
Updated pts cousin on pt condition. Pt  Heather [name] [phone]

## 2020-08-14 NOTE — H&P (View-Only) (Signed)
Progress Note  Patient Name: Heather [name] Date of Encounter: 08/14/2020  [facility] Cardiologist: Larae Grooms, MD  Subjective   Resume cath from Dr. Terrence Dupont as patient belongs to Dr. Irish Lack. No recurrent chest pain.  Breathing stable.  Inpatient Medications    Scheduled Meds:  diltiazem  10 mg Intravenous Once   Continuous Infusions:  heparin 1,000 Units/hr (08/14/20 0211)   PRN Meds:    Vital Signs    Vitals:   08/14/20 0645 08/14/20 0700 08/14/20 0715 08/14/20 0740  BP: 138/70 (!) 143/76 (!) 163/74   Pulse: 64 (!) 57 66   Resp: 19 20 (!) 24   Temp:    98.1 F (36.7 C)  TempSrc:    Oral  SpO2: 100% 97% 96%   Weight:      Height:       No intake or output data in the 24 hours ending 08/14/20 0917 Last 3 Weights 08/14/2020 08/02/2020 05/23/2020  Weight (lbs) 210 lb 214 lb 6.4 oz 208 lb 3.2 oz  Weight (kg) 95.255 kg 97.251 kg 94.439 kg  Some encounter information is confidential and restricted. Go to Review Flowsheets activity to see all data.      Telemetry    Sinus rhythm at controlled ventricular rate- Personally Reviewed  ECG    Sinus rhythm, PAC- Personally Reviewed  Physical Exam   GEN: No acute distress.   Neck: No JVD Cardiac: RRR, no murmurs, rubs, or gallops.  Respiratory: Clear to auscultation bilaterally. GI: Soft, nontender, non-distended  MS: No edema; No deformity. Neuro:  Nonfocal  Psych: Normal affect   Labs    High Sensitivity Troponin:   Recent Labs  Lab 08/14/20 0149 08/14/20 0349  TROPONINIHS 114* 476*      Chemistry Recent Labs  Lab 08/14/20 0244  NA 140  K 4.8  CL 110  GLUCOSE 312*  BUN 51*  CREATININE 1.40*     Hematology Recent Labs  Lab 08/14/20 0149 08/14/20 0244  WBC 7.5  --   RBC 4.23  --   HGB 11.8* 10.9*  HCT 37.9 32.0*  MCV 89.6  --   MCH 27.9  --   MCHC 31.1  --   RDW 15.3  --   PLT 216  --     BNP Recent Labs  Lab 08/14/20 0204  BNP 66.1     Radiology    DG Chest  Portable 1 View  Result Date: 08/14/2020 CLINICAL DATA:  Shortness of breath EXAM: PORTABLE CHEST 1 VIEW COMPARISON:  02/12/2019 FINDINGS: The heart size and mediastinal contours are within normal limits. Both lungs are clear. The visualized skeletal structures are unremarkable. IMPRESSION: No active disease. Electronically Signed   By: Ulyses Jarred M.D.   On: 08/14/2020 02:29    Cardiac Studies   None  Patient Profile     70 y.o. female with history of nonobstructive CAD, hypertension, hyperlipidemia, diabetes mellitus, morbid obesity and noncompliance with medication.  Patient had a sudden onset chest pain and palpitation around 10 PM last night.  Chest pain felt like gas/burping sensation with shortness of breath and diaphoresis.  EMS was called around midnight and patient was found to be in A. fib with RVR.  She was given aspirin 324 mg and sublingual nitroglycerin x1.  Chest pain improved patient spontaneously converted prior to ER arrival.  Pending EMS description in media. Patient denies regular exercise or prior chest pain/palpitation.  Assessment & Plan    1.  Chest pain/elevated  troponin -Chest pain in setting of A. fib RVR.  High-sensitivity troponin 114>476.  -This could be demand ischemia in setting of elevated heart rate. -Catheterization in 2017 showed 50% ostial RCA and proximal RCA stenosis>> medical therapy recommended but patient is noncompliant. -No regular exercise -Significant cardiac risk factor - On nitro patch  -Will review plan with MD.  Keep NPO. -Start aspirin 81 mg daily, Lipitor 80 mg daily and beta-blocker -Echocardiogram March 2022 with LV function of 60 to 65% and grade 1 diastolic dysfunction.  No significant valvular abnormality.  2.  New onset atrial fibrillation with rapid ventricular rate  -Felt chest pain and palpitation last night around 10 PM.  Spontaneously converted after aspirin and nitroglycerin in route.  Currently maintaining sinus  rhythm. -CHA2DS2-VASc Score = 5  The patient's score is based upon: CHF History: No HTN History: Yes Diabetes History: Yes Stroke History: No Vascular Disease History: Yes Age Score: 1 Gender Score: 1  -Patient will need anticoagulation. -Start beta-blocker  3.  Hypertension -Blood pressure elevated -Will start antihypertensive regimen  4. HLD - 08/02/2020: Cholesterol, Total 259; HDL 69; LDL Chol Calc (NIH) 166; Triglycerides 134  - Add Lipitor 80mg  qd  5. DM - HgbA1c was 8.1 about week ago - She was taking insulin at home - SSI while admitted  6.  Social issue -Patient has housing instability and currently lives with new relative.  7. Acute on presume CKD IIIa - Scr in range of 1.1-1.3 previously - Seems stable   I have at length discussion about medication compliance.  Patient is willing to take her medications.  Will consult social worker and case Freight forwarder.   For questions or updates, please contact [place] Please consult [email or web address] for contact info under        SignedLeanor Kail, PA  08/14/2020, 9:17 AM

## 2020-08-14 NOTE — Progress Notes (Addendum)
[place] for IV Heparin  Indication: atrial fibrillation, ACS/CP  No Known Allergies  Patient Measurements: Height: 5\' 7"  (170.2 cm) Weight: 100.2 kg (220 lb 14.4 oz) IBW/kg (Calculated) : 61.6 Heparin Dosing Weight: 84 kg  Vital Signs: Temp: 98 F (36.7 C) (06/20 1659) Temp Source: Oral (06/20 1659) BP: 158/80 (06/20 1600) Pulse Rate: 62 (06/20 1600)  Labs: Recent Labs    08/14/20 0149 08/14/20 0244 08/14/20 0349 08/14/20 1020 08/14/20 1115 08/14/20 1755  HGB 11.8* 10.9*  --   --   --   --   HCT 37.9 32.0*  --   --   --   --   PLT 216  --   --   --   --   --   HEPARINUNFRC  --   --   --  <0.10*  --  0.30  CREATININE  --  1.40*  --   --   --   --   TROPONINIHS 114*  --  476*  --  1,506* 1,029*     Estimated Creatinine Clearance: 45.5 mL/min (A) (by C-G formula based on SCr of 1.4 mg/dL (H)).   Medical History: Past Medical History:  Diagnosis Date   Abscess, gluteal, right 05/25/2016   Diabetes mellitus    Encounter for hepatitis C screening test for low risk patient 04/13/2018   Screening 04/13/2018   Food insecurity 04/13/2018   Hyperlipidemia    Hypertension    Medicare annual wellness visit, initial 04/13/2018   Patient is able to have AWV initial   Neuropathy    Other chest pain 01/01/2016   Screening for breast cancer 04/13/2018   Sepsis ([place]) 05/25/2016   Tibial plateau fracture, left 02/16/2015   Uncontrolled diabetes mellitus with diabetic neuropathy, with long-term current use of insulin ([place]) 01/01/2016    Assessment: 70 yr old woman with new onset afib with RVR and hx of CAD. Plan for left heart cath tomorrow, 6/21. Pharmacy was consulted to dose IV heparin. Pt was not on anticoagulant PTA.  Heparin level ~6 hrs after heparin 4000 units IV bolus X 1, followed by order to increase heparin infusion to 1200 units/hr, was 0.30 units/ml, which is at the low end of the goal range for this pt. However, in looking at  [facility], pt only rec'd heparin infusion rate of 1200 units/hr for 3 minutes, then infusion rate was decreased back to 1000 units/hr and has remained at 1000 units/hr since 1155 today. H/H 10.9/32.0, plt 216. Per RN, no issues with IV or bleeding observed.  Goal of Therapy:  Heparin level 0.3-0.7 units/ml Monitor platelets by anticoagulation protocol: Yes   Plan:  Increase heparin infusion slightly to 1050 units/hr Check heparin level in 6 hrs Monitor daily heparin level, CBC Monitor for bleeding F/U after cath tomorrow F/U transition to oral anticoagulant when able  Gillermina Hu, PharmD, BCPS, [facility] Clinical Pharmacist 08/14/2020, 7:25 PM

## 2020-08-14 NOTE — ED Notes (Signed)
Md palumbo notfied of trop of 476

## 2020-08-14 NOTE — ED Notes (Signed)
Pt on 2 l [place]
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
## 2020-08-14 NOTE — Progress Notes (Signed)
ANTICOAGULATION CONSULT NOTE - Initial Consult  Pharmacy Consult for Heparin  Indication: atrial fibrillation, ACS/CP  No Known Allergies  Patient Measurements: Height: 5\' 7"  (170.2 cm) Weight: 95.3 kg (210 lb) IBW/kg (Calculated) : 61.6  Vital Signs: Temp: 98.1 F (36.7 C) (06/20 0740) Temp Source: Oral (06/20 0740) BP: 162/89 (06/20 1000) Pulse Rate: 66 (06/20 1000)  Labs: Recent Labs    08/14/20 0149 08/14/20 0244 08/14/20 0349 08/14/20 1020  HGB 11.8* 10.9*  --   --   HCT 37.9 32.0*  --   --   PLT 216  --   --   --   HEPARINUNFRC  --   --   --  <0.10*  CREATININE  --  1.40*  --   --   TROPONINIHS 114*  --  476*  --      Estimated Creatinine Clearance: 44.3 mL/min (A) (by C-G formula based on SCr of 1.4 mg/dL (H)).   Medical History: Past Medical History:  Diagnosis Date   Abscess, gluteal, right 05/25/2016   Diabetes mellitus    Encounter for hepatitis C screening test for low risk patient 04/13/2018   Screening 04/13/2018   Food insecurity 04/13/2018   Hyperlipidemia    Hypertension    Medicare annual wellness visit, initial 04/13/2018   Patient is able to have AWV initial   Neuropathy    Other chest pain 01/01/2016   Screening for breast cancer 04/13/2018   Sepsis ([place]) 05/25/2016   Tibial plateau fracture, left 02/16/2015   Uncontrolled diabetes mellitus with diabetic neuropathy, with long-term current use of insulin ([place]) 01/01/2016     Assessment: 70 y/o F with new onset afib. History of CAD. Plan for left heart cath. Starting heparin per pharmacy. Hgb 10.9. Scr 1.4. PTA meds reviewed.   Heparin level undetectable/  Goal of Therapy:  Heparin level 0.3-0.7 units/ml Monitor platelets by anticoagulation protocol: Yes   Plan:  Rebolus with Heparin 4000 units IV x 1 Increase  heparin drip at 1200 units/hr 1800 Heparin level Daily CBC/Heparin level Monitor for bleeding  Alanda Slim, PharmD, [facility] Clinical Pharmacist Please see AMION for all  Pharmacists' Contact Phone Numbers 08/14/2020, 11:52 AM

## 2020-08-14 NOTE — ED Notes (Signed)
Nurse report given Mateo Flow, RN
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
## 2020-08-14 NOTE — ED Triage Notes (Signed)
Gas pressure, chest pain about 1 hr PTA. Pain/numbness went down left arm. 6/10 initially. Hx of DMII and HTN and medically non compliant. Patient arrives in uncontrolled Afib with RVR. Up into the 200s - patient has been hypertensive. Given 3241 ASA 1 SL nitro, 148/78 after 3/10 pain following. Pain is coming back at this time. EMS attempted vagal maneuver with no positive outcome. BS is 401. Ambulatory, A&Ox4, denis SOB, N/V.

## 2020-08-15 ENCOUNTER — Encounter (HOSPITAL_COMMUNITY): Payer: Self-pay | Admitting: Cardiology

## 2020-08-15 ENCOUNTER — Other Ambulatory Visit (HOSPITAL_COMMUNITY): Payer: Self-pay

## 2020-08-15 ENCOUNTER — Encounter (HOSPITAL_COMMUNITY)
Admission: EM | Disposition: A | Discharge status: Home / Self Care | Payer: Self-pay | Source: Home / Self Care | Attending: Cardiology

## 2020-08-15 DIAGNOSIS — I214 Non-ST elevation (NSTEMI) myocardial infarction: Secondary | ICD-10-CM | POA: Diagnosis not present

## 2020-08-15 DIAGNOSIS — I25110 Atherosclerotic heart disease of native coronary artery with unstable angina pectoris: Secondary | ICD-10-CM | POA: Diagnosis not present

## 2020-08-15 DIAGNOSIS — I480 Paroxysmal atrial fibrillation: Secondary | ICD-10-CM

## 2020-08-15 HISTORY — PX: LEFT HEART CATH AND CORONARY ANGIOGRAPHY: CATH118249

## 2020-08-15 HISTORY — PX: CORONARY STENT INTERVENTION: CATH118234

## 2020-08-15 LAB — CBC
HCT: 30.7 % — ABNORMAL LOW (ref 36.0–46.0)
Hemoglobin: 9.9 g/dL — ABNORMAL LOW (ref 12.0–15.0)
MCH: 28.3 pg (ref 26.0–34.0)
MCHC: 32.2 g/dL (ref 30.0–36.0)
MCV: 87.7 fL (ref 80.0–100.0)
Platelets: 298 10*3/uL (ref 150–400)
RBC: 3.5 MIL/uL — ABNORMAL LOW (ref 3.87–5.11)
RDW: 15.5 % (ref 11.5–15.5)
WBC: 9 10*3/uL (ref 4.0–10.5)
nRBC: 0 % (ref 0.0–0.2)

## 2020-08-15 LAB — BASIC METABOLIC PANEL
Anion gap: 11 (ref 5–15)
BUN: 28 mg/dL — ABNORMAL HIGH (ref 8–23)
CO2: 19 mmol/L — ABNORMAL LOW (ref 22–32)
Calcium: 8.6 mg/dL — ABNORMAL LOW (ref 8.9–10.3)
Chloride: 112 mmol/L — ABNORMAL HIGH (ref 98–111)
Creatinine, Ser: 1.19 mg/dL — ABNORMAL HIGH (ref 0.44–1.00)
GFR, Estimated: 49 mL/min — ABNORMAL LOW (ref >60)
Glucose, Bld: 118 mg/dL — ABNORMAL HIGH (ref 70–99)
Potassium: 4 mmol/L (ref 3.5–5.1)
Sodium: 142 mmol/L (ref 135–145)

## 2020-08-15 LAB — GLUCOSE, CAPILLARY
Glucose-Capillary: 124 mg/dL — ABNORMAL HIGH (ref 70–99)
Glucose-Capillary: 136 mg/dL — ABNORMAL HIGH (ref 70–99)
Glucose-Capillary: 221 mg/dL — ABNORMAL HIGH (ref 70–99)
Glucose-Capillary: 213 mg/dL — ABNORMAL HIGH (ref 70–99)

## 2020-08-15 LAB — LIPID PANEL
Cholesterol: 224 mg/dL — ABNORMAL HIGH (ref 0–200)
HDL: 63 mg/dL (ref >40)
LDL Cholesterol: 141 mg/dL — ABNORMAL HIGH (ref 0–99)
Total CHOL/HDL Ratio: 3.6 RATIO
Triglycerides: 99 mg/dL (ref <150)
VLDL: 20 mg/dL (ref 0–40)

## 2020-08-15 LAB — POCT ACTIVATED CLOTTING TIME
Activated Clotting Time: 271 seconds
Activated Clotting Time: 289 seconds

## 2020-08-15 LAB — HEPARIN LEVEL (UNFRACTIONATED): Heparin Unfractionated: 0.22 IU/mL — ABNORMAL LOW (ref 0.30–0.70)

## 2020-08-15 SURGERY — LEFT HEART CATH AND CORONARY ANGIOGRAPHY
Anesthesia: LOCAL

## 2020-08-15 MED ORDER — FENTANYL CITRATE (PF) 100 MCG/2ML IJ SOLN
INTRAMUSCULAR | Status: DC | PRN
  Administered 2020-08-15: 25 ug via INTRAVENOUS

## 2020-08-15 MED ORDER — HEPARIN (PORCINE) IN NACL 1000-0.9 UT/500ML-% IV SOLN
INTRAVENOUS | Status: DC | PRN
  Administered 2020-08-15 (×2): 500 mL

## 2020-08-15 MED ORDER — MIDAZOLAM HCL 2 MG/2ML IJ SOLN
INTRAMUSCULAR | Status: DC | PRN
  Administered 2020-08-15: 1 mg via INTRAVENOUS

## 2020-08-15 MED ORDER — HEPARIN (PORCINE) IN NACL 1000-0.9 UT/500ML-% IV SOLN
INTRAVENOUS | Status: AC
  Filled 2020-08-15: qty 1000

## 2020-08-15 MED ORDER — INSULIN ASPART 100 UNIT/ML IJ SOLN
0.0000 [IU] | Freq: Three times a day (TID) | INTRAMUSCULAR | Status: DC
  Administered 2020-08-15: 3 [IU] via SUBCUTANEOUS
  Administered 2020-08-16: 1 [IU] via SUBCUTANEOUS

## 2020-08-15 MED ORDER — HEPARIN SODIUM (PORCINE) 1000 UNIT/ML IJ SOLN
INTRAMUSCULAR | Status: AC
  Filled 2020-08-15: qty 1

## 2020-08-15 MED ORDER — LABETALOL HCL 5 MG/ML IV SOLN: 10.0000 mg | INTRAVENOUS | Status: AC | PRN

## 2020-08-15 MED ORDER — VERAPAMIL HCL 2.5 MG/ML IV SOLN
INTRAVENOUS | Status: AC
  Filled 2020-08-15: qty 2

## 2020-08-15 MED ORDER — FAMOTIDINE IN NACL 20-0.9 MG/50ML-% IV SOLN
INTRAVENOUS | Status: AC
  Filled 2020-08-15: qty 50

## 2020-08-15 MED ORDER — SODIUM CHLORIDE 0.9 % WEIGHT BASED INFUSION
3.0000 mL/kg/h | INTRAVENOUS | Status: DC
  Administered 2020-08-15: 3 mL/kg/h via INTRAVENOUS

## 2020-08-15 MED ORDER — APIXABAN 5 MG PO TABS
5.0000 mg | ORAL_TABLET | Freq: Two times a day (BID) | ORAL | Status: DC
  Administered 2020-08-15 – 2020-08-16 (×2): 5 mg via ORAL
  Filled 2020-08-15 (×2): qty 1

## 2020-08-15 MED ORDER — VERAPAMIL HCL 2.5 MG/ML IV SOLN
INTRAVENOUS | Status: DC | PRN
  Administered 2020-08-15: 10 mL via INTRA_ARTERIAL

## 2020-08-15 MED ORDER — CLOPIDOGREL BISULFATE 300 MG PO TABS
ORAL_TABLET | ORAL | Status: AC
  Filled 2020-08-15: qty 2

## 2020-08-15 MED ORDER — CLOPIDOGREL BISULFATE 75 MG PO TABS
75.0000 mg | ORAL_TABLET | Freq: Every day | ORAL | Status: DC
  Administered 2020-08-16: 75 mg via ORAL
  Filled 2020-08-15: qty 1

## 2020-08-15 MED ORDER — FAMOTIDINE IN NACL 20-0.9 MG/50ML-% IV SOLN
INTRAVENOUS | Status: DC | PRN
  Administered 2020-08-15: 20 mg via INTRAVENOUS

## 2020-08-15 MED ORDER — FENTANYL CITRATE (PF) 100 MCG/2ML IJ SOLN
INTRAMUSCULAR | Status: AC
  Filled 2020-08-15: qty 2

## 2020-08-15 MED ORDER — SODIUM CHLORIDE 0.9% FLUSH: 3.0000 mL | INTRAVENOUS | Status: DC | PRN

## 2020-08-15 MED ORDER — MIDAZOLAM HCL 2 MG/2ML IJ SOLN
INTRAMUSCULAR | Status: AC
  Filled 2020-08-15: qty 2

## 2020-08-15 MED ORDER — SODIUM CHLORIDE 0.9% FLUSH
3.0000 mL | Freq: Two times a day (BID) | INTRAVENOUS | Status: DC
  Administered 2020-08-15: 3 mL via INTRAVENOUS

## 2020-08-15 MED ORDER — HYDRALAZINE HCL 20 MG/ML IJ SOLN
INTRAMUSCULAR | Status: AC
  Filled 2020-08-15: qty 1

## 2020-08-15 MED ORDER — CLOPIDOGREL BISULFATE 300 MG PO TABS
ORAL_TABLET | ORAL | Status: DC | PRN
  Administered 2020-08-15: 600 mg via ORAL

## 2020-08-15 MED ORDER — SODIUM CHLORIDE 0.9 % WEIGHT BASED INFUSION
1.0000 mL/kg/h | INTRAVENOUS | Status: DC
  Administered 2020-08-15 (×2): 1 mL/kg/h via INTRAVENOUS

## 2020-08-15 MED ORDER — SODIUM CHLORIDE 0.9 % IV SOLN: INTRAVENOUS | Status: AC

## 2020-08-15 MED ORDER — NITROGLYCERIN 1 MG/10 ML FOR IR/CATH LAB
INTRA_ARTERIAL | Status: AC
  Filled 2020-08-15: qty 10

## 2020-08-15 MED ORDER — LIDOCAINE HCL (PF) 1 % IJ SOLN
INTRAMUSCULAR | Status: AC
  Filled 2020-08-15: qty 30

## 2020-08-15 MED ORDER — IOHEXOL 350 MG/ML SOLN
INTRAVENOUS | Status: DC | PRN
  Administered 2020-08-15: 145 mL

## 2020-08-15 MED ORDER — SODIUM CHLORIDE 0.9 % IV SOLN: 250.0000 mL | INTRAVENOUS | Status: DC | PRN

## 2020-08-15 MED ORDER — MORPHINE SULFATE (PF) 2 MG/ML IV SOLN: 2.0000 mg | INTRAVENOUS | Status: DC | PRN

## 2020-08-15 MED ORDER — HYDRALAZINE HCL 20 MG/ML IJ SOLN
10.0000 mg | INTRAMUSCULAR | Status: AC | PRN
  Administered 2020-08-15 (×2): 10 mg via INTRAVENOUS

## 2020-08-15 MED ORDER — NITROGLYCERIN 1 MG/10 ML FOR IR/CATH LAB
INTRA_ARTERIAL | Status: DC | PRN
  Administered 2020-08-15: 200 ug
  Administered 2020-08-15: 300 ug
  Administered 2020-08-15: 200 ug

## 2020-08-15 MED ORDER — HEPARIN SODIUM (PORCINE) 1000 UNIT/ML IJ SOLN
INTRAMUSCULAR | Status: DC | PRN
  Administered 2020-08-15 (×2): 5000 [IU] via INTRAVENOUS

## 2020-08-15 MED ORDER — LIDOCAINE HCL (PF) 1 % IJ SOLN
INTRAMUSCULAR | Status: DC | PRN
  Administered 2020-08-15: 2 mL

## 2020-08-15 SURGICAL SUPPLY — 21 items
BALLN SAPPHIRE 2.5X12 (BALLOONS) ×2
BALLN SAPPHIRE ~~LOC~~ 4.0X12 (BALLOONS) ×1 IMPLANT
BALLN SCOREFLEX 3.0X10 (BALLOONS) ×2
BALLOON SAPPHIRE 2.5X12 (BALLOONS) IMPLANT
BALLOON SCOREFLEX 3.0X10 (BALLOONS) IMPLANT
CATH INFINITI 5 FR 3DRC (CATHETERS) ×1 IMPLANT
CATH LAUNCHER 5F EBU3.0 (CATHETERS) IMPLANT
CATH OPTITORQUE TIG 4.0 5F (CATHETERS) ×1 IMPLANT
CATHETER LAUNCHER 5F EBU3.0 (CATHETERS) ×2
DEVICE RAD COMP TR BAND LRG (VASCULAR PRODUCTS) ×1 IMPLANT
GLIDESHEATH SLEND SS 6F .021 (SHEATH) ×1 IMPLANT
GUIDEWIRE INQWIRE 1.5J.035X260 (WIRE) IMPLANT
INQWIRE 1.5J .035X260CM (WIRE) ×2
KIT ENCORE 26 ADVANTAGE (KITS) ×1 IMPLANT
KIT HEART LEFT (KITS) ×2 IMPLANT
PACK CARDIAC CATHETERIZATION (CUSTOM PROCEDURE TRAY) ×2 IMPLANT
STENT SYNERGY XD 3.50X16 (Permanent Stent) IMPLANT
SYNERGY XD 3.50X16 (Permanent Stent) ×2 IMPLANT
TRANSDUCER W/STOPCOCK (MISCELLANEOUS) ×2 IMPLANT
TUBING CIL FLEX 10 FLL-RA (TUBING) ×2 IMPLANT
WIRE ASAHI PROWATER 180CM (WIRE) ×1 IMPLANT

## 2020-08-15 NOTE — Progress Notes (Signed)
ANTICOAGULATION CONSULT NOTE  Pharmacy Consult for Heparin  Indication: atrial fibrillation, ACS/CP  No Known Allergies  Patient Measurements: Height: 5\' 7"  (170.2 cm) Weight: 100.2 kg (220 lb 14.4 oz) (scale b) IBW/kg (Calculated) : 61.6 Heparin Dosing Weight: 84 kg  Vital Signs: Temp: 98 F (36.7 C) (06/21 0141) Temp Source: Oral (06/21 0141) BP: 177/88 (06/21 0141)  Labs: Recent Labs    08/14/20 0149 08/14/20 0244 08/14/20 0349 08/14/20 1020 08/14/20 1115 08/14/20 1755 08/15/20 0335  HGB 11.8* 10.9*  --   --   --   --  9.9*  HCT 37.9 32.0*  --   --   --   --  30.7*  PLT 216  --   --   --   --   --  298  HEPARINUNFRC  --   --   --  <0.10*  --  0.30 0.22*  CREATININE  --  1.40*  --   --   --   --  1.19*  TROPONINIHS 114*  --  476*  --  1,506* 1,029*  --      Estimated Creatinine Clearance: 53.5 mL/min (A) (by C-G formula based on SCr of 1.19 mg/dL (H)).   Assessment: 70 y.o. female with Afib and chest pain for heparin  Goal of Therapy:  Heparin level 0.3-0.7 units/ml Monitor platelets by anticoagulation protocol: Yes   Plan:  Increase Heparin 1200 units/hr Follow up after cath today   Phillis Knack, PharmD, BCPS  08/15/2020, 4:44 AM

## 2020-08-15 NOTE — Progress Notes (Signed)
Progress Note  Patient Name: RAYMIE TRANI Date of Encounter: 08/15/2020  [facility] Cardiologist: Larae Grooms, MD   Subjective   Feeling well. No chest pain, sob or palpitations.    Inpatient Medications    Scheduled Meds:  aspirin EC  81 mg Oral Daily   atorvastatin  80 mg Oral Daily   insulin aspart  0-9 Units Subcutaneous TID WC   insulin glargine  10 Units Subcutaneous QHS   metoprolol tartrate  12.5 mg Oral Once   metoprolol tartrate  25 mg Oral BID   nitroGLYCERIN  0.5 inch Topical Q6H   sodium chloride flush  3 mL Intravenous Q12H   Continuous Infusions:  sodium chloride     sodium chloride 1 mL/kg/hr (08/15/20 0723)   heparin 1,200 Units/hr (08/15/20 0646)   PRN Meds: sodium chloride, acetaminophen, nitroGLYCERIN, ondansetron (ZOFRAN) IV, sodium chloride flush   Vital Signs    Vitals:   08/15/20 0300 08/15/20 0400 08/15/20 0503 08/15/20 0752  BP:   (!) 145/77 (!) 156/81  Pulse:    (!) 57  Resp: 19 18 14 17   Temp:   97.8 F (36.6 C) 97.9 F (36.6 C)  TempSrc:   Oral Oral  SpO2:   100% 100%  Weight:      Height:        Intake/Output Summary (Last 24 hours) at 08/15/2020 0805 Last data filed at 08/15/2020 0646 Gross per 24 hour  Intake 2135.15 ml  Output 825 ml  Net 1310.15 ml   Last 3 Weights 08/15/2020 08/14/2020 08/14/2020  Weight (lbs) 220 lb 14.4 oz 220 lb 14.4 oz 210 lb  Weight (kg) 100.2 kg 100.2 kg 95.255 kg  Some encounter information is confidential and restricted. Go to Review Flowsheets activity to see all data.      Telemetry    Sinus brady/rhythm at 50-60s- Personally Reviewed  ECG    Sinus bradycardia at 59 bpm- Personally Reviewed  Physical Exam   GEN: No acute distress.   Neck: No JVD Cardiac: RRR, no murmurs, rubs, or gallops.  Respiratory: Clear to auscultation bilaterally. GI: Soft, nontender, non-distended  MS: No edema; No deformity. Neuro:  Nonfocal  Psych: Normal affect   Labs    High Sensitivity  Troponin:   Recent Labs  Lab 08/14/20 0149 08/14/20 0349 08/14/20 1115 08/14/20 1755  TROPONINIHS 114* 476* 1,506* 1,029*      Chemistry Recent Labs  Lab 08/14/20 0244 08/15/20 0335  NA 140 142  K 4.8 4.0  CL 110 112*  CO2  --  19*  GLUCOSE 312* 118*  BUN 51* 28*  CREATININE 1.40* 1.19*  CALCIUM  --  8.6*  GFRNONAA  --  49*  ANIONGAP  --  11     Hematology Recent Labs  Lab 08/14/20 0149 08/14/20 0244 08/15/20 0335  WBC 7.5  --  9.0  RBC 4.23  --  3.50*  HGB 11.8* 10.9* 9.9*  HCT 37.9 32.0* 30.7*  MCV 89.6  --  87.7  MCH 27.9  --  28.3  MCHC 31.1  --  32.2  RDW 15.3  --  15.5  PLT 216  --  298    BNP Recent Labs  Lab 08/14/20 0204  BNP 66.1      Radiology    DG Chest Portable 1 View  Result Date: 08/14/2020 CLINICAL DATA:  Shortness of breath EXAM: PORTABLE CHEST 1 VIEW COMPARISON:  02/12/2019 FINDINGS: The heart size and mediastinal contours are within normal limits.  Both lungs are clear. The visualized skeletal structures are unremarkable. IMPRESSION: No active disease. Electronically Signed   By: Ulyses Jarred M.D.   On: 08/14/2020 02:29    Cardiac Studies   Pending cath   Patient Profile     70 y.o. female  with history of nonobstructive CAD, hypertension, hyperlipidemia, diabetes mellitus, morbid obesity and noncompliance with medication.  Patient had a sudden onset chest pain and palpitation around 10 PM l6/19. Chest pain felt like gas/burping sensation with shortness of breath and diaphoresis.  EMS was called around midnight and patient was found to be in A. fib with RVR.  She was given aspirin 324 mg and sublingual nitroglycerin x1.  Chest pain improved patient spontaneously converted en rout or in ER. Patient denies regular exercise or prior chest pain/palpitation.    Assessment & Plan    1.  NSTEMI -Chest pain in setting of A. fib RVR.  High-sensitivity troponin 114>476>>1506>>1029 -This could be demand ischemia in setting of elevated  heart rate. -Catheterization in 2017 showed 50% ostial RCA and proximal RCA stenosis>> medical therapy recommended but patient is noncompliant. -No recurrent pain on nitro ointment. -Continue aspirin 81 mg daily, Lipitor 80 mg daily and beta-blocker -Echocardiogram March 2022 with LV function of 60 to 65% and grade 1 diastolic dysfunction.  No significant valvular abnormality. - Pending cath today    2.  New onset atrial fibrillation with rapid ventricular rate -Felt chest pain and palpitation last night around 10 PM.  Spontaneously converted after aspirin and nitroglycerin in route.  Currently maintaining sinus rhythm. -CHA2DS2-VASc Score = 5  The patient's score is based upon: CHF History: No HTN History: Yes Diabetes History: Yes Stroke History: No Vascular Disease History: Yes Age Score: 1 Gender Score: 1  -Patient will need anticoagulation post cath.  -Continue lopressor. Will not titrate, HR in 50-60s   3.  Hypertension -Blood pressure elevated -Continue lopressor. Will not titrate, HR in 50-60s. May consider changing to Coreg - Was on Benicar in the past. Plan to add Olmesartan 20mg  post cath   4. HLD - 08/02/2020: Cholesterol, Total 259; HDL 69; LDL Chol Calc (NIH) 166; Triglycerides 134  - Added Lipitor 80mg  qd - Consider OP f/u labs 6-8 weeks given statin initiation this admission.    5. DM - HgbA1c was 8.1 about week ago - She was taking insulin at home - SSI while admitted   6.  Social issue -Patient has housing instability and currently lives with new relative.   7. Acute on presume CKD IIIa - Scr in range of 1.1-1.3 previously - Seems stable     For questions or updates, please contact [facility] Please consult [email or web address] for contact info under        SignedLeanor Kail, PA  08/15/2020, 8:05 AM

## 2020-08-15 NOTE — Interval H&P Note (Signed)
History and Physical Interval Note:  08/15/2020 9:00 AM  Heather [name]  has presented today for surgery, with the diagnosis of nstemi.  The various methods of treatment have been discussed with the patient and family. After consideration of risks, benefits and other options for treatment, the patient has consented to  Procedure(s): LEFT HEART CATH AND CORONARY ANGIOGRAPHY (N/A)  PERCUTANEOUS CORONARY INTERVENTION  as a surgical intervention.  The patient's history has been reviewed, patient examined, no change in status, stable for surgery.  I have reviewed the patient's chart and labs.  Questions were answered to the patient's satisfaction.    Cath Lab Visit (complete for each Cath Lab visit)  Clinical Evaluation Leading to the Procedure:   ACS: Yes.    Non-ACS:    Anginal Classification: CCS III  Anti-ischemic medical therapy: Minimal Therapy (1 class of medications)  Non-Invasive Test Results: No non-invasive testing performed  Prior CABG: No previous CABG    Glenetta Hew

## 2020-08-15 NOTE — TOC Benefit Eligibility Note (Signed)
Patient Teacher, English as a foreign language completed.    The patient is currently admitted and upon discharge could be taking Eliquis 5 mg.  The current 30 day co-pay is, $9.85.   The patient is insured through [employer] [place] Patient Advocate Specialist [employer] Team Direct Number: [phone]  Fax: [phone]

## 2020-08-16 ENCOUNTER — Other Ambulatory Visit: Payer: Self-pay | Admitting: Student

## 2020-08-16 ENCOUNTER — Other Ambulatory Visit (HOSPITAL_COMMUNITY): Payer: Self-pay

## 2020-08-16 DIAGNOSIS — I480 Paroxysmal atrial fibrillation: Secondary | ICD-10-CM

## 2020-08-16 DIAGNOSIS — I25110 Atherosclerotic heart disease of native coronary artery with unstable angina pectoris: Secondary | ICD-10-CM

## 2020-08-16 DIAGNOSIS — E7800 Pure hypercholesterolemia, unspecified: Secondary | ICD-10-CM | POA: Diagnosis not present

## 2020-08-16 DIAGNOSIS — I10 Essential (primary) hypertension: Secondary | ICD-10-CM

## 2020-08-16 DIAGNOSIS — N183 Chronic kidney disease, stage 3 unspecified: Secondary | ICD-10-CM

## 2020-08-16 DIAGNOSIS — I214 Non-ST elevation (NSTEMI) myocardial infarction: Principal | ICD-10-CM

## 2020-08-16 LAB — BASIC METABOLIC PANEL
Anion gap: 9 (ref 5–15)
BUN: 22 mg/dL (ref 8–23)
CO2: 20 mmol/L — ABNORMAL LOW (ref 22–32)
Calcium: 8.9 mg/dL (ref 8.9–10.3)
Chloride: 110 mmol/L (ref 98–111)
Creatinine, Ser: 1.29 mg/dL — ABNORMAL HIGH (ref 0.44–1.00)
GFR, Estimated: 45 mL/min — ABNORMAL LOW (ref >60)
Glucose, Bld: 128 mg/dL — ABNORMAL HIGH (ref 70–99)
Potassium: 3.9 mmol/L (ref 3.5–5.1)
Sodium: 139 mmol/L (ref 135–145)

## 2020-08-16 LAB — CBC
HCT: 33.4 % — ABNORMAL LOW (ref 36.0–46.0)
Hemoglobin: 10.6 g/dL — ABNORMAL LOW (ref 12.0–15.0)
MCH: 27.7 pg (ref 26.0–34.0)
MCHC: 31.7 g/dL (ref 30.0–36.0)
MCV: 87.4 fL (ref 80.0–100.0)
Platelets: 273 10*3/uL (ref 150–400)
RBC: 3.82 MIL/uL — ABNORMAL LOW (ref 3.87–5.11)
RDW: 15.5 % (ref 11.5–15.5)
WBC: 8.6 10*3/uL (ref 4.0–10.5)
nRBC: 0 % (ref 0.0–0.2)

## 2020-08-16 LAB — GLUCOSE, CAPILLARY
Glucose-Capillary: 129 mg/dL — ABNORMAL HIGH (ref 70–99)
Glucose-Capillary: 219 mg/dL — ABNORMAL HIGH (ref 70–99)

## 2020-08-16 LAB — HEMOGLOBIN A1C
Hgb A1c MFr Bld: 7.9 % — ABNORMAL HIGH (ref 4.8–5.6)
Mean Plasma Glucose: 180 mg/dL

## 2020-08-16 MED ORDER — NITROGLYCERIN 0.4 MG SL SUBL
0.4000 mg | SUBLINGUAL_TABLET | SUBLINGUAL | 2 refills | Status: DC | PRN
  Filled 2020-08-16: qty 25, 7d supply, fill #0

## 2020-08-16 MED ORDER — CARVEDILOL 3.125 MG PO TABS
3.1250 mg | ORAL_TABLET | Freq: Two times a day (BID) | ORAL | 2 refills | Status: DC
  Filled 2020-08-16: qty 60, 30d supply, fill #0

## 2020-08-16 MED ORDER — OLMESARTAN MEDOXOMIL 20 MG PO TABS
20.0000 mg | ORAL_TABLET | Freq: Every day | ORAL | 2 refills | Status: DC
  Filled 2020-08-16: qty 30, 30d supply, fill #0

## 2020-08-16 MED ORDER — CARVEDILOL 3.125 MG PO TABS: 3.1250 mg | ORAL_TABLET | Freq: Two times a day (BID) | ORAL | Status: DC

## 2020-08-16 MED ORDER — TRESIBA FLEXTOUCH 200 UNIT/ML ~~LOC~~ SOPN: 32.0000 [IU] | PEN_INJECTOR | Freq: Every day | SUBCUTANEOUS | Status: DC

## 2020-08-16 MED ORDER — CLOPIDOGREL BISULFATE 75 MG PO TABS
75.0000 mg | ORAL_TABLET | Freq: Every day | ORAL | 11 refills | Status: DC
  Filled 2020-08-16: qty 30, 30d supply, fill #0

## 2020-08-16 MED ORDER — IRBESARTAN 150 MG PO TABS
150.0000 mg | ORAL_TABLET | Freq: Every day | ORAL | Status: DC
  Administered 2020-08-16: 150 mg via ORAL
  Filled 2020-08-16: qty 1

## 2020-08-16 MED ORDER — ATORVASTATIN CALCIUM 80 MG PO TABS
80.0000 mg | ORAL_TABLET | Freq: Every day | ORAL | 2 refills | Status: DC
  Filled 2020-08-16: qty 30, 30d supply, fill #0

## 2020-08-16 MED ORDER — APIXABAN 5 MG PO TABS
5.0000 mg | ORAL_TABLET | Freq: Two times a day (BID) | ORAL | 5 refills | Status: DC
  Filled 2020-08-16: qty 60, 30d supply, fill #0

## 2020-08-16 NOTE — Progress Notes (Signed)
Ordered repeat BMET to be drawn in 1 week. Please see discharge summary from today for more information.  Darreld Mclean, PA-C 08/16/2020 11:01 AM

## 2020-08-16 NOTE — Progress Notes (Signed)
CARDIAC REHAB PHASE I   PRE:  Rate/Rhythm: 68 SR  BP:  Sitting: 158/90      SaO2: 100 RA  MODE:  Ambulation: 200 ft   POST:  Rate/Rhythm: 80 SR  BP:  Sitting: 192/103 --> 194/90    SaO2: 100 RA   Pt ambulated 217ft in hallway standby assist with slow, steady gait. Pt denies CP, SOB, or dizziness. Pt educated on importance of ASA, Plavix, statin, and NTG. Pt given stent card and MI Book along with heart healthy and diabetic diets. Reviewed site care, restrictions, and exercise guidelines. Pt requesting note for work, APP made aware. Will refer to CRP II [place].  [id number] Rufina Falco, RN BSN 08/16/2020 9:20 AM

## 2020-08-16 NOTE — Care Management (Signed)
08-16-20 1212 Case Manager received a consult regarding housing for this patient. The patient was living with a cousin and can no longer return. Case Manager received a call from her daughter, that is living in a motel at this time asking for assistance for her mom. Case Manager provided the family with [facility] [employer] numbers along with [employer] numbers. Family is aware to call and place mom on the [employer] waiting list and to call the resource numbers provided. Case Manager provided information on Affordable Independent Living Facilities (IDL) Facility spaces such as Engineer, drilling and Medical laboratory scientific officer (ALF). The daughter will have mom stay with her in the motel until she can find adequate housing. No further needs from Case Manager at this time.

## 2020-08-16 NOTE — Discharge Instructions (Addendum)
Medication Changes: - CONTINUE Aspirin 81mg  daily. Plan will be to continue this for 1 month. However, we will review recommendations at your follow-up visit. - START Plavix 75mg  daily. Plan will be to continue this for 6-12 months. Continue this medication until we tell you to stop it. - START Eliquis 5mg  twice daily. - CONTINUE Carvedilol (Coreg) 3.125mg  twice daily. - INCREASE Olmesartan (Benicar) to 20mg  daily.  Continue all other home medications as directed elsewhere on discharge paperwork.  **Please come by our office on Tuesday 08/22/2020 for a lab visit to recheck your kidney function. There will be an appointment time on your paperwork but you can actually come by anytime from 8:00am to 4:30pm.** **Please follow-up with your primary care physician in 1-2 weeks.**   Post NSTEMI: NO HEAVY LIFTING X 2 WEEKS. NO SEXUAL ACTIVITY X 2 WEEKS. NO DRIVING X 1 WEEK. NO SOAKING BATHS, HOT TUBS, POOLS, ETC., X 7 DAYS. _______________  Radial Site Care: Refer to this sheet in the next few weeks. These instructions provide you with information on caring for yourself after your procedure. Your caregiver may also give you more specific instructions. Your treatment has been planned according to current medical practices, but problems sometimes occur. Call your caregiver if you have any problems or questions after your procedure. HOME CARE INSTRUCTIONS You may shower the day after the procedure. Remove the bandage (dressing) and gently wash the site with plain soap and water. Gently pat the site dry.  Do not apply powder or lotion to the site.  Do not submerge the affected site in water for 3 to 5 days.  Inspect the site at least twice daily.  Do not flex or bend the affected arm for 24 hours.  No lifting over 5 pounds (2.3 kg) for 5 days after your procedure.  Do not drive home if you are discharged the same day of the procedure. Have someone else drive you.  What to expect: Any bruising will  usually fade within 1 to 2 weeks.  Blood that collects in the tissue (hematoma) may be painful to the touch. It should usually decrease in size and tenderness within 1 to 2 weeks.  SEEK IMMEDIATE MEDICAL CARE IF: You have unusual pain at the radial site.  You have redness, warmth, swelling, or pain at the radial site.  You have drainage (other than a small amount of blood on the dressing).  You have chills.  You have a fever or persistent symptoms for more than 72 hours.  You have a fever and your symptoms suddenly get worse.  Your arm becomes pale, cool, tingly, or numb.  You have heavy bleeding from the site. Hold pressure on the site.

## 2020-08-16 NOTE — Progress Notes (Signed)
Pt is alert and oriented. Discharge instructions/ AVS gicen to pt. Pt is instructed to get the medications from [facility] on their way to exit.

## 2020-08-16 NOTE — Discharge Summary (Signed)
Discharge Summary    Patient ID: Heather [name] MRN: [id number]; DOB: [birth year]  Admit date: 08/14/2020 Discharge date: 08/16/2020  PCP:  Richarda Osmond, DO   [facility] Providers Cardiologist:  Larae Grooms, MD   {  Discharge Diagnoses    Principal Problem:   NSTEMI (non-ST elevated myocardial infarction) [facility]) Active Problems:   Coronary artery disease involving native coronary artery of native heart with unstable angina pectoris (HCC)   PAF (paroxysmal atrial fibrillation) ([place])   HTN (hypertension)   Type 2 diabetes mellitus with complication, with long-term current use of insulin (HCC)   HLD (hyperlipidemia)    Diagnostic Studies/Procedures    Left Cardiac Catheterization 08/15/2020: CULPRIT LESION: Prox Cx lesion is 80% stenosed. After scoring balloon angioplasty was performed, A drug-eluting stent was successfully placed using a SYNERGY XD 3.50X16 -> postdilated to 4.1 mm Post intervention, there is a 0% residual stenosis. ----------------------- Dist LAD-1 lesion is 45% stenosed. Dist LAD-2 lesion is 50% stenosed. Ost RCA lesion is 45% stenosed. Prox RCA lesion is 55% stenosed. LV end diastolic pressure is normal. There is no aortic valve stenosis.   Summary: Severe single-vessel disease with 80% focal stenosis in the proximal-mid LCx, stable moderate disease in the ostial and proximal RCA as well as mid LAD. Successful DES PCI of LCx with a Synergy DES 3.5 mm x 16 mm postdilated to 4.1 mm. Normal LVEDP.   Recommendations: Transferred to 6 E. postprocedure unit for post PCI care. Antiplatelet/anticoagulation per recommendation segment. Continue Aggressive Risk Factor Modification with Guideline Directed Medical Therapy per primary cardiology team.   Diagnostic Dominance: Right      Intervention        _____________   History of Present Illness     Heather [name] is a 70 y.o. female with a history of non-obstructive CAD, hypertension,  hyperlipidemia, type 2 diabetes, morbid obesity, and medication non-compliance. Patient developed sudden onset of chest pain and palpitations around 10pm on 08/13/2020. Patient described the pain as a gas/burping sensation with shortness of breath and diaphoresis. EMS was called around midnight and patient was found to be in atrial fibrillation with RVR. She was given Aspirin 373m and one dose of sublingual Nitro. Chest pain improved and patient spontaneously converted en route to the ED. Patient was found to have elevated troponin and he was admitted for further evaluation/management of NSTEMI and atrial fibrillation with RVR.  Hospital Course     Consultants: Case Manager  NSTEMI Patient presented with chest pain in the setting of atrial fibrillation with RVR. High-sensitivity troponin elevated at 114 >> 476 >> 1,506 >> 1,029. Recent Echo in 04/2020 showed LVEF of 60-65%. Left cardiac catheterization on 6/21 showed 80% stenosis of proximal LCX lesion (culprit lesion) with moderate non-obstructive disease elsewhere. She underwent successful PCI with DES to LCX. Patient tolerated the procedure well. No recurrent chest pain. Will make sure patient ambulates with Cardiac Rehab prior to discharge. Plan is for triple therapy with Aspirin 8100mdaily, Plavix 7521maily, and Eliquis 5mg76mice daily or 1 month at which time Aspirin can be stopped. Plavix should be continued for at least 6 months (ideally 1 year if no significant bleeding issues). Continue beta-blocker and high-intensity statin.   New Onset Atrial Fibrillation with RVR Patient was found to be in new onset atrial fibrillatin with RVR when EMS arrived. Spontaneously converted after Aspirin and Nitro en route to the ED and has maintained sinus rhythm since then. Home Coreg was initially stopped and  patient was placed on Lopressor. However, we will discharge her back on home Coreg 3.190m twice daily for additional BP control. CHA2DS2-VASc = 5 (CAD,  HTN, DM, age, female). Started on Eliquis 561mtwice daily.   Hypertension BP has been elevated throughout admission. Patient on low dose Coreg and Olmesartan at home but patient admits that she does not take this regularly. Olmesartan was held on admission in anticipation for cardiac catheterization. Will restart home Coreg 3.12530mwice daily and increase Olmesartan to 70m31mily at discharge. Will need BMET in 1 week.   Hyperlipidemia Lipid panel on 08/02/2020: Total Cholesterol 259, Triglycerides 134, HDL 69, LDL 166. LDL goal <70 given CAD. Lipitor 40mg64mted under PTA medications but patient stated she was not taking her medications like she should. Lipitor was increased to 80mg 58m admission. Will need repeat lipid panel and LFTs in 6-8 weeks.   Type 2 Diabetes Mellitus Hemoglobin A1c 7.9 this admission. Placed on sliding scale insulin. Will restart home Metformin and Insulin at discharge. Can restart Metformin 48 hours after cardiac catheterization. Will need to follow-up with PCP.   Presumed CKD Stage III Creatinine 1.40 on admission and  stable at 1.29 on day of discharge. Baseline around 1.1 to 1.3. Will recheck BMET in 1 week.   Social Issues Patient reports housing instability. She recently moved in with a cousin but states she will not be able to stay there for long. Will consult Case Manager/Social Worker prior to discharge.   Patient seen and examined by Dr. TurnerRadford Pax and determined to be stable for discharge. Outpatient follow-up has been arranged. Medications as below.  Did the patient have an acute coronary syndrome (MI, NSTEMI, STEMI, etc) this admission?:  Yes                               AHA/ACC Clinical Performance & Quality Measures: Aspirin prescribed? - Yes ADP Receptor Inhibitor (Plavix/Clopidogrel, Brilinta/Ticagrelor or Effient/Prasugrel) prescribed (includes medically managed patients)? - Yes Beta Blocker prescribed? - Yes High Intensity Statin (Lipitor  40-80mg o37mestor 20-40mg) p1mribed? - Yes EF assessed during THIS hospitalization? -  No - Reason:  No LV gram was performed during cath. However, recent Echo in 04/2020 showed normal LV function. For EF <40%, was ACEI/ARB prescribed? - Not Applicable (EF >/= 40%) For52% <40%, Aldosterone Antagonist (Spironolactone or Eplerenone) prescribed? - Not Applicable (EF >/= 40%) Car77%c Rehab Phase II ordered (including medically managed patients)? - Yes   _____________  Discharge Vitals Blood pressure (!) 166/90, pulse 68, temperature 98.1 F (36.7 C), temperature source Oral, resp. rate 18, height '5\' 7"'  (1.702 m), weight 100.2 kg, SpO2 99 %.  Filed Weights   08/14/20 0150 08/14/20 1700 08/15/20 0147  Weight: 95.3 kg 100.2 kg 100.2 kg   General: 70 y.o. 76male resting comfortably in no acute distress. HEENT: Normocephalic and atraumatic. Sclera clear. E Neck: Supple.  Heart: RRR. Distinct S1 and S2. No murmurs, gallops, or rubs. Right radial cath site soft with no signs of hematoma. Lungs: No increased work of breathing. Clear to ausculation bilaterally. No wheezes, rhonchi, or rales.  Abdomen: Soft, non-distended, and non-tender to palpation.  Extremities: No lower extremity edema.    Skin: Warm and dry. Neuro: Alert and oriented x3. No focal deficits. Psych: Normal affect. Responds appropriately.  Labs & Radiologic Studies    CBC Recent Labs    08/14/20 0149 08/14/20 0244 08/15/20 0335 08/16/20 [id number]  WBC 7.5  --  9.0 8.6  NEUTROABS 5.6  --   --   --   HGB 11.8*   < > 9.9* 10.6*  HCT 37.9   < > 30.7* 33.4*  MCV 89.6  --  87.7 87.4  PLT 216  --  298 273   < > = values in this interval not displayed.   Basic Metabolic Panel Recent Labs    08/15/20 0335 08/16/20 0311  NA 142 139  K 4.0 3.9  CL 112* 110  CO2 19* 20*  GLUCOSE 118* 128*  BUN 28* 22  CREATININE 1.19* 1.29*  CALCIUM 8.6* 8.9   Liver Function Tests No results for input(s): AST, ALT, ALKPHOS, BILITOT,  PROT, ALBUMIN in the last 72 hours. No results for input(s): LIPASE, AMYLASE in the last 72 hours. High Sensitivity Troponin:   Recent Labs  Lab 08/14/20 0149 08/14/20 0349 08/14/20 1115 08/14/20 1755  TROPONINIHS 114* 476* 1,506* 1,029*    BNP Invalid input(s): POCBNP D-Dimer No results for input(s): DDIMER in the last 72 hours. Hemoglobin A1C Recent Labs    08/15/20 0335  HGBA1C 7.9*   Fasting Lipid Panel Recent Labs    08/15/20 0335  CHOL 224*  HDL 63  LDLCALC 141*  TRIG 99  CHOLHDL 3.6   Thyroid Function Tests No results for input(s): TSH, T4TOTAL, T3FREE, THYROIDAB in the last 72 hours.  Invalid input(s): FREET3 _____________  CARDIAC CATHETERIZATION  Result Date: 08/15/2020  CULPRIT LESION: Prox Cx lesion is 80% stenosed.  After scoring balloon angioplasty was performed, A drug-eluting stent was successfully placed using a SYNERGY XD 3.50X16 -> postdilated to 4.1 mm  Post intervention, there is a 0% residual stenosis.  -----------------------  Dist LAD-1 lesion is 45% stenosed. Dist LAD-2 lesion is 50% stenosed.  Ost RCA lesion is 45% stenosed. Prox RCA lesion is 55% stenosed.  LV end diastolic pressure is normal. There is no aortic valve stenosis.  SUMMARY  Severe single-vessel disease with 80% focal stenosis in the proximal-mid LCx, stable moderate disease in the ostial and proximal RCA as well as mid LAD.  Successful DES PCI of LCx with a Synergy DES 3.5 mm x 16 mm postdilated to 4.1 mm.  Normal LVEDP. RECOMMENDATIONS  Transferred to 6 E. postprocedure unit for post PCI care.  Antiplatelet/anticoagulation per recommendation segment.  Continue Aggressive Risk Factor Modification with Guideline Directed Medical Therapy per primary cardiology team. Glenetta Hew, MD  DG Chest Portable 1 View  Result Date: 08/14/2020 CLINICAL DATA:  Shortness of breath EXAM: PORTABLE CHEST 1 VIEW COMPARISON:  02/12/2019 FINDINGS: The heart size and mediastinal contours are  within normal limits. Both lungs are clear. The visualized skeletal structures are unremarkable. IMPRESSION: No active disease. Electronically Signed   By: Ulyses Jarred M.D.   On: 08/14/2020 02:29   Disposition   Patient is being discharged home today in good condition.  Follow-up Plans & Appointments     Follow-up Information     Jettie Booze, MD Follow up.   Specialties: Cardiology, Radiology, Interventional Cardiology Why: Hospital follow-up with Cardiology scheduled for 08/31/2020 at 8:40am. Please arrive 15 minutes early for check-in. If this date/time does not work for you, please call our office to reschedule. Contact information: [address] [place] [address] [phone]         Richarda Osmond, DO. Call.   Specialty: Family Medicine Why: Please call your primary care physician's office after discharge to arrange follow-up in  1 week. Contact information: [address] [place] [address] [phone]         [place] [facility] Follow up.   Specialty: Cardiology Why: Please come by Dr. Hassell Done office on Tuesday 08/22/2020 for a lab visit so that we can recheck your kidney function. You can come by anytime from 8:00am to 4:30pm. Contact information: [address] [place] [phone]               Discharge Instructions     Amb Referral to Cardiac Rehabilitation   Complete by: As directed    Diagnosis:  Coronary Stents NSTEMI     After initial evaluation and assessments completed: Virtual Based Care may be provided alone or in conjunction with Phase 2 Cardiac Rehab based on patient barriers.: Yes   Diet - low sodium heart healthy   Complete by: As directed    Increase activity slowly   Complete by: As directed        Discharge Medications   Allergies as of 08/16/2020   No Known Allergies      Medication List     TAKE these medications     apixaban 5 MG Tabs tablet Commonly known as: ELIQUIS Take 1 tablet (5 mg total) by mouth 2 (two) times daily.   aspirin 81 MG chewable tablet Chew 1 tablet (81 mg total) by mouth daily.   atorvastatin 80 MG tablet Commonly known as: LIPITOR Take 1 tablet (80 mg total) by mouth daily. Start taking on: August 17, 2020 What changed:  medication strength how much to take   blood glucose meter kit and supplies Kit Dispense based on patient and insurance preference. Use up to four times daily as directed.   carvedilol 3.125 MG tablet Commonly known as: COREG Take 1 tablet (3.125 mg total) by mouth 2 (two) times daily with a meal. What changed: See the new instructions.   clopidogrel 75 MG tablet Commonly known as: PLAVIX Take 1 tablet (75 mg total) by mouth daily with breakfast. Start taking on: August 17, 2020   diclofenac Sodium 1 % Gel Commonly known as: VOLTAREN Apply 2-4 g topically 4 (four) times daily.   metFORMIN 500 MG 24 hr tablet Commonly known as: GLUCOPHAGE-XR TAKE 1 TABLET BY MOUTH TWICE A DAY   Needles & Syringes Misc 1 Container by Does not apply route daily.   nitroGLYCERIN 0.4 MG SL tablet Commonly known as: NITROSTAT Place 1 tablet (0.4 mg total) under the tongue every 5 (five) minutes as needed for chest pain.   olmesartan 20 MG tablet Commonly known as: BENICAR Take 1 tablet (20 mg total) by mouth daily. What changed:  medication strength See the new instructions.   [name] FlexTouch 200 UNIT/ML FlexTouch Pen Generic drug: insulin degludec Inject 32 Units into the skin daily.           Outstanding Labs/Studies   Repeat BMET in 1 week after increasing Olmesartan. Repeat lipid panel and LFTs in 6-9 weeks after increasing statin.  Duration of Discharge Encounter   Greater than 30 minutes including physician time.  Signed, Darreld Mclean, PA-C 08/16/2020, 10:55 AM

## 2020-08-22 ENCOUNTER — Other Ambulatory Visit: Payer: Self-pay

## 2020-08-22 ENCOUNTER — Other Ambulatory Visit: Payer: Medicare (Managed Care) | Admitting: *Deleted

## 2020-08-22 DIAGNOSIS — I10 Essential (primary) hypertension: Secondary | ICD-10-CM

## 2020-08-22 DIAGNOSIS — N183 Chronic kidney disease, stage 3 unspecified: Secondary | ICD-10-CM

## 2020-08-23 ENCOUNTER — Ambulatory Visit (INDEPENDENT_AMBULATORY_CARE_PROVIDER_SITE_OTHER): Payer: Medicare (Managed Care) | Admitting: Family Medicine

## 2020-08-23 ENCOUNTER — Other Ambulatory Visit: Payer: Self-pay | Admitting: Student in an Organized Health Care Education/Training Program

## 2020-08-23 ENCOUNTER — Other Ambulatory Visit: Payer: Self-pay

## 2020-08-23 ENCOUNTER — Encounter: Payer: Self-pay | Admitting: Family Medicine

## 2020-08-23 DIAGNOSIS — R198 Other specified symptoms and signs involving the digestive system and abdomen: Secondary | ICD-10-CM | POA: Diagnosis not present

## 2020-08-23 DIAGNOSIS — N179 Acute kidney failure, unspecified: Secondary | ICD-10-CM | POA: Diagnosis not present

## 2020-08-23 LAB — BASIC METABOLIC PANEL
BUN/Creatinine Ratio: 25 (ref 12–28)
BUN: 51 mg/dL — ABNORMAL HIGH (ref 8–27)
CO2: 21 mmol/L (ref 20–29)
Calcium: 9.4 mg/dL (ref 8.7–10.3)
Chloride: 101 mmol/L (ref 96–106)
Creatinine, Ser: 2.08 mg/dL — ABNORMAL HIGH (ref 0.57–1.00)
Glucose: 147 mg/dL — ABNORMAL HIGH (ref 65–99)
Potassium: 4 mmol/L (ref 3.5–5.2)
Sodium: 142 mmol/L (ref 134–144)
eGFR: 25 mL/min/{1.73_m2} — ABNORMAL LOW (ref >59)

## 2020-08-23 MED ORDER — METFORMIN HCL ER 500 MG PO TB24: 500.0000 mg | ORAL_TABLET | Freq: Two times a day (BID) | ORAL | 1 refills | Status: DC

## 2020-08-23 NOTE — Assessment & Plan Note (Signed)
Most likely related to doubling up on ACE inhibitor's and ARB's.  For now, I have taken her lisinopril-HCTZ which she had with her clinic.  She is also encouraged to throw away any of the additional lisinopril-HCTZ she may have had at home.  We will follow-up in clinic in 1 week for repeat blood work and also to check her blood pressure. -Follow-up in 1 week for BP follow-up and BMP check

## 2020-08-23 NOTE — Assessment & Plan Note (Signed)
I have a low suspicion for infectious etiology causing her nausea and loose stools at this time.  I think it is most likely a medication side effect from starting new medications or restarting old medication after a significant lapse.  Her new medications include aspirin, Plavix, Eliquis and olmesartan.  She was recently restarted on her metformin 1 g twice daily.  I think metformin is most likely responsible for her GI symptoms.  I have reduced her dose to 500 twice daily in addition to transitioning to the extended release formulation. -Metformin changed to 500 mg twice daily extended release -Follow-up in 1 week to ensure improvement -Uptitrate metformin to 1 g twice daily in 4 weeks if she is able to tolerate

## 2020-08-23 NOTE — Patient Instructions (Signed)
It was great to see you today.  Here is a quick review of the things we talked about:  Nausea and loose stool: I think this is from your metformin.  I have put in a new prescription to give you the extended release formulation of metformin.  It is also a reduced dose.  I would like you to take 500 mg in the morning and 500 mg in the evening.  If if you can tolerate this dose well for the next several weeks, you can increase your dose again.  Kidney injury: Your creatinine is little bit elevated today.  It looks like you are still taking 1 of medications that we intended to stop after your hospitalization.  You should not be taking the lisinopril-HCTZ medication.  That is the medication that I took from you today in the clinic.  It is a little pink pill that says B03 on it.  If you have any more of these pills at home, please dispose of them.  Please come back to clinic in 1 week.  We will have to check your blood pressure again to make sure that it is okay and we will also check your kidney function again.

## 2020-08-23 NOTE — Progress Notes (Signed)
    SUBJECTIVE:   CHIEF COMPLAINT / HPI:   Hospital follow-up She was discharged from the hospital on 6/22 following hospitalization for NSTEMI requiring catheterization and stenting.  She is also found to be in new A. fib with RVR.  During her hospitalization, she was started on aspirin, Plavix and Eliquis.  In addition to these new medicines, she is also started back on her diabetes regimen which includes metformin 1 g twice daily.  Prior to her hospitalization, she had not consistently been taking this medicine.  Since hospital discharge, she has had significant abdominal discomfort and frequent watery stools.  Apart from these symptoms, she is not having any other issues.  She specifically denies chest pain, shortness of breath, palpitations.  AKI Blood work from her visit to cardiology yesterday shows a significant increase in her creatinine.  After a discussion regarding her medication, it turns out that she has been taking lisinopril-HCTZ.  This medication was prescribed prior to her hospitalization and was discontinued during her hospitalization.  Apparently, she did not realize she was supposed to discontinue this blood pressure medicine.  PERTINENT  PMH / PSH: NSTEMI, hypertension, recent hospitalization  OBJECTIVE:   BP 126/68   Pulse 76   Ht 5\' 7"  (1.702 m)   Wt 204 lb (92.5 kg)   SpO2 100%   BMI 31.95 kg/m    General: Alert and cooperative and appears to be in no acute distress Cardio: Normal S1 and S2, no S3 or S4. Rhythm is regular. No murmurs or rubs.   Pulm: Clear to auscultation bilaterally, no crackles, wheezing, or diminished breath sounds. Normal respiratory effort Abdomen: Bowel sounds normal. Abdomen soft and non-tender.  Extremities: No peripheral edema. Warm/ well perfused.  Strong radial pulses. Neuro: Cranial nerves grossly intact   ASSESSMENT/PLAN:   AKI (acute kidney injury) ([place]) Most likely related to doubling up on ACE inhibitor's and ARB's.  For  now, I have taken her lisinopril-HCTZ which she had with her clinic.  She is also encouraged to throw away any of the additional lisinopril-HCTZ she may have had at home.  We will follow-up in clinic in 1 week for repeat blood work and also to check her blood pressure. -Follow-up in 1 week for BP follow-up and BMP check  GI symptoms I have a low suspicion for infectious etiology causing her nausea and loose stools at this time.  I think it is most likely a medication side effect from starting new medications or restarting old medication after a significant lapse.  Her new medications include aspirin, Plavix, Eliquis and olmesartan.  She was recently restarted on her metformin 1 g twice daily.  I think metformin is most likely responsible for her GI symptoms.  I have reduced her dose to 500 twice daily in addition to transitioning to the extended release formulation. -Metformin changed to 500 mg twice daily extended release -Follow-up in 1 week to ensure improvement -Uptitrate metformin to 1 g twice daily in 4 weeks if she is able to tolerate     Matilde Haymaker, MD [place]

## 2020-08-24 ENCOUNTER — Telehealth (HOSPITAL_COMMUNITY): Payer: Self-pay

## 2020-08-24 NOTE — Telephone Encounter (Signed)
Attempted to call patient in regards to Cardiac Rehab - LM on VM 

## 2020-08-29 ENCOUNTER — Telehealth (HOSPITAL_COMMUNITY): Payer: Self-pay | Admitting: Pharmacist

## 2020-08-29 NOTE — Telephone Encounter (Signed)
Attempted to reach patient, LVM

## 2020-08-30 ENCOUNTER — Telehealth (HOSPITAL_COMMUNITY): Payer: Self-pay | Admitting: Pharmacist

## 2020-08-30 ENCOUNTER — Ambulatory Visit (INDEPENDENT_AMBULATORY_CARE_PROVIDER_SITE_OTHER): Payer: Medicare (Managed Care) | Admitting: Family Medicine

## 2020-08-30 ENCOUNTER — Other Ambulatory Visit: Payer: Self-pay

## 2020-08-30 VITALS — BP 140/74 | HR 80 | Ht 67.0 in | Wt 207.2 lb

## 2020-08-30 DIAGNOSIS — N179 Acute kidney failure, unspecified: Secondary | ICD-10-CM | POA: Diagnosis not present

## 2020-08-30 DIAGNOSIS — Z794 Long term (current) use of insulin: Secondary | ICD-10-CM

## 2020-08-30 DIAGNOSIS — Z59819 Housing instability, housed unspecified: Secondary | ICD-10-CM

## 2020-08-30 DIAGNOSIS — R198 Other specified symptoms and signs involving the digestive system and abdomen: Secondary | ICD-10-CM | POA: Diagnosis not present

## 2020-08-30 DIAGNOSIS — Z599 Problem related to housing and economic circumstances, unspecified: Secondary | ICD-10-CM | POA: Diagnosis not present

## 2020-08-30 DIAGNOSIS — E118 Type 2 diabetes mellitus with unspecified complications: Secondary | ICD-10-CM

## 2020-08-30 NOTE — Telephone Encounter (Signed)
Unable to reach patient, will attempt again at a later date.

## 2020-08-30 NOTE — Assessment & Plan Note (Signed)
I have sent a referral to [facility] for SDOH.  Her contact information has been confirmed in the chart.

## 2020-08-30 NOTE — Assessment & Plan Note (Addendum)
Patient continues to have loose stools as noted during last appointment a week ago. Patient was previously on metformin for several years without any issue in the past.  She did not decrease her dose of metformin as instructed by Dr. Pilar Plate last week, because she has been on this medication for many years without any issue.  She is not taking any other stool softeners or other medications besides the medications that were reviewed today.  She did not get any antibiotics while in the hospital, though hospitalization by itself would be a risk factor for C. difficile. No hx of c diff. She is not having any abdominal pain, fever, changes in stool color or bloody bowel movements.  On physical exam, benign abdominal exam with moist mucous membranes.  No concern for hemodynamic instability at this time.  Loose stool from Plavix or olmesartan is a very rare side effect. -At this time, decision to continue monitoring and try loperamide for symptoms.  She will also discuss this with cardiology tomorrow to see if they have any specific input.  If no resolution in 1 week, patient should return for stool studies for C. difficile.

## 2020-08-30 NOTE — Progress Notes (Signed)
SUBJECTIVE:  CHIEF COMPLAINT / HPI:   Hypertension Following up today for HTN. She was seen by cards in the hospital and it looks like they will be managing her medication. She was started on olmesartan at discharge from the hospital. She is also taking coreg 3.125 mg BID. She has follow up with cardiology tomorrow. Will defer to them. Patient has medications with her today. Med rec performed and updated in chart.   Loose Stools  This has continued since her hospitalization. She was seen by Dr. Pilar Plate at her hospital follow-up appointment and her metformin dose was decreased to 500 mg twice daily.  She denies any bloody bowel movements, abdominal pain, foul smell.  She reports bowel movements occur 3-4 times a day and are loose.  Housing instability During our conversation today, it was revealed that patient does not have stable housing at this time.  She is currently living out of her car and hotel.  She soon will not be able to afford living in the hotel.  She denies any food insecurity.  She does report having some financial strain.  She does not have any other sources of support in the area except for an individual that she refers to it as her "daughter" but is not related to.  Diabetes  No glucometer.  And has not been taking her insulin for a week.  Patient would be interested in getting a Dexcom and had been discussing this with Dr. Ouida Sills prior to her departure.  I will refer the patient to Dr. Claiborne Billings for further follow-up with her diabetes and medication needs.  PERTINENT  PMH / PSH:  Patient Active Problem List   Diagnosis Date Noted   GI symptoms 08/23/2020   NSTEMI (non-ST elevated myocardial infarction) (HCC)    PAF (paroxysmal atrial fibrillation) ([place])    Coronary artery disease involving native coronary artery of native heart with unstable angina pectoris [facility])    Housing instability 01/27/2020   Depression 04/14/2019   Epigastric pain 04/14/2019   AKI (acute kidney  injury) ([place]) 02/13/2019   GERD (gastroesophageal reflux disease) 05/08/2018   Abn findings on dx imaging of abd regions, inc retroperiton 05/08/2018   HLD (hyperlipidemia) 04/13/2018   HTN (hypertension) 01/01/2016   Type 2 diabetes mellitus with complication, with long-term current use of insulin ([place]) 01/01/2016   Neuropathy 01/01/2016   OBJECTIVE:  BP 140/74   Pulse 80   Ht 5\' 7"  (1.702 m)   Wt 207 lb 4 oz (94 kg)   SpO2 98%   BMI 32.46 kg/m   General: well appearing, NAD  Chest: RRR  Lungs: CTAB Abdomen: NTND. + BS  MSK: no BLEE   ASSESSMENT/PLAN:  Type 2 diabetes mellitus with complication, with long-term current use of insulin ([place]) Forwarded chart to Dr. Georgina Peer for follow-up in pharmacy clinic for her diabetes.  Housing instability I have sent a referral to CCM for SDOH.  Her contact information has been confirmed in the chart.   GI symptoms Patient continues to have loose stools as noted during last appointment a week ago. Patient was previously on metformin for several years without any issue in the past.  She did not decrease her dose of metformin as instructed by Dr. Pilar Plate last week, because she has been on this medication for many years without any issue.  She is not taking any other stool softeners or other medications besides the medications that were reviewed today.  She did not get any antibiotics while  in the hospital, though hospitalization by itself would be a risk factor for C. difficile. No hx of c diff. She is not having any abdominal pain, fever, changes in stool color or bloody bowel movements.  On physical exam, benign abdominal exam with moist mucous membranes.  No concern for hemodynamic instability at this time.  Loose stool from Plavix or olmesartan is a very rare side effect. -At this time, decision to continue monitoring and try loperamide for symptoms.  She will also discuss this with cardiology tomorrow to see if they have any specific input.  If no  resolution in 1 week, patient should return for stool studies for C. difficile.    Wilber Oliphant, MD [place]

## 2020-08-30 NOTE — Assessment & Plan Note (Signed)
Forwarded chart to Dr. Georgina Peer for follow-up in pharmacy clinic for her diabetes.

## 2020-08-30 NOTE — Patient Instructions (Signed)
Dear Heather [name],   Today we discussed the following:   Loose stools  Make sure to drink plenty of water to stay hydrated  You can try loperamide for symptoms. Take as directed on the packaging.  Continue to monitor this.   Diabetes I will forward your information to Dr. Georgina Peer to help with your glucometer and insulin needs   Housing issues  I am referring you to Chronic Care Management. They will call you   For any labs obtained today, I will send results via MyChart.  If anything is abnormal, we will reach out to you for details on further management.   Be well,   Dr. Maudie Mercury  ===================================================================================

## 2020-08-31 ENCOUNTER — Other Ambulatory Visit: Payer: Self-pay | Admitting: Family Medicine

## 2020-08-31 ENCOUNTER — Encounter: Payer: Self-pay | Admitting: Interventional Cardiology

## 2020-08-31 ENCOUNTER — Telehealth: Payer: Self-pay | Admitting: *Deleted

## 2020-08-31 ENCOUNTER — Ambulatory Visit (INDEPENDENT_AMBULATORY_CARE_PROVIDER_SITE_OTHER): Payer: Medicare (Managed Care) | Admitting: Interventional Cardiology

## 2020-08-31 VITALS — BP 156/80 | HR 84 | Ht 67.0 in | Wt 206.4 lb

## 2020-08-31 DIAGNOSIS — I25118 Atherosclerotic heart disease of native coronary artery with other forms of angina pectoris: Secondary | ICD-10-CM

## 2020-08-31 DIAGNOSIS — I10 Essential (primary) hypertension: Secondary | ICD-10-CM

## 2020-08-31 DIAGNOSIS — E118 Type 2 diabetes mellitus with unspecified complications: Secondary | ICD-10-CM

## 2020-08-31 DIAGNOSIS — I480 Paroxysmal atrial fibrillation: Secondary | ICD-10-CM

## 2020-08-31 DIAGNOSIS — E1159 Type 2 diabetes mellitus with other circulatory complications: Secondary | ICD-10-CM | POA: Diagnosis not present

## 2020-08-31 DIAGNOSIS — E782 Mixed hyperlipidemia: Secondary | ICD-10-CM

## 2020-08-31 DIAGNOSIS — N183 Chronic kidney disease, stage 3 unspecified: Secondary | ICD-10-CM

## 2020-08-31 DIAGNOSIS — Z794 Long term (current) use of insulin: Secondary | ICD-10-CM

## 2020-08-31 LAB — BASIC METABOLIC PANEL
BUN/Creatinine Ratio: 20 (ref 12–28)
BUN: 32 mg/dL — ABNORMAL HIGH (ref 8–27)
CO2: 20 mmol/L (ref 20–29)
Calcium: 9.3 mg/dL (ref 8.7–10.3)
Chloride: 108 mmol/L — ABNORMAL HIGH (ref 96–106)
Creatinine, Ser: 1.59 mg/dL — ABNORMAL HIGH (ref 0.57–1.00)
Glucose: 142 mg/dL — ABNORMAL HIGH (ref 65–99)
Potassium: 4.9 mmol/L (ref 3.5–5.2)
Sodium: 143 mmol/L (ref 134–144)
eGFR: 35 mL/min/{1.73_m2} — ABNORMAL LOW (ref >59)

## 2020-08-31 MED ORDER — BLOOD GLUCOSE MONITOR KIT
PACK | 0 refills | Status: DC
Start: 2020-08-31 — End: 2021-07-13
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
## 2020-08-31 NOTE — Chronic Care Management (AMB) (Signed)
  Care Management   Outreach Note  08/31/2020 Name: Heather [name] MRN: [id number] DOB: [birth year]  Referred by: Sharion Settler, DO Reason for referral : Care Coordination (Initial outreach to schedule referral with Licensed Clinical SW )   An unsuccessful telephone outreach was attempted today. The patient was referred to the case management team for assistance with care management and care coordination.   Follow Up Plan: A HIPAA compliant phone message was left for the patient providing contact information and requesting a return call. The care management team will reach out to the patient again over the next 5 days.  If patient returns call to provider office, please advise to call [place] at [phone].  [place] Management

## 2020-08-31 NOTE — Patient Instructions (Signed)
Medication Instructions:  Your physician has recommended you make the following change in your medication: Stop aspirin  *If you need a refill on your cardiac medications before your next appointment, please call your pharmacy*   Lab Work: Your physician recommends that you return for lab work on October 6,2022-This will be fasting Lipid and CMET.  You can stop by the office anytime after 7:30 AM  If you have labs (blood work) drawn today and your tests are completely normal, you will receive your results only by: [place] (if you have MyChart) OR A paper copy in the mail If you have any lab test that is abnormal or we need to change your treatment, we will call you to review the results.   Testing/Procedures: none   Follow-Up: At [facility], you and your health needs are our priority.  As part of our continuing mission to provide you with exceptional heart care, we have created designated Provider Care Teams.  These Care Teams include your primary Cardiologist (physician) and Advanced Practice Providers (APPs -  Physician Assistants and Nurse Practitioners) who all work together to provide you with the care you need, when you need it.  We recommend signing up for the patient portal called "MyChart".  Sign up information is provided on this After Visit Summary.  MyChart is used to connect with patients for Virtual Visits (Telemedicine).  Patients are able to view lab/test results, encounter notes, upcoming appointments, etc.  Non-urgent messages can be sent to your provider as well.   To learn more about what you can do with MyChart, go to [email or web address].    Your next appointment:   October 7,2022 at 11:40  The format for your next appointment:   In Person  Provider:   Casandra Doffing, MD   Other Instructions

## 2020-08-31 NOTE — Progress Notes (Signed)
Glucometer and strips/lancets sent into pharmacy.

## 2020-09-01 ENCOUNTER — Other Ambulatory Visit (HOSPITAL_COMMUNITY): Payer: Self-pay

## 2020-09-01 ENCOUNTER — Telehealth (HOSPITAL_COMMUNITY): Payer: Self-pay | Admitting: Pharmacist

## 2020-09-01 NOTE — Telephone Encounter (Signed)
Pharmacy Transitions of Care Follow-up Telephone Call  Date of discharge: 08/16/2020   How have you been since you were released from the hospital? Good, tired still   Medication changes made at discharge:  - START: apixaban, clopidogrel  - STOPPED: None  - CHANGED: atorvastatin, metformin, olmesartan, carvedilol  Medication changes verified by the patient? Yes (Yes/No)    Medication Accessibility:  Home Pharmacy[id number]   Was the patient provided with refills on discharged medications? Yes   Have all prescriptions been transferred from [facility] to home pharmacy? Yes   Is the patient able to afford medications? Heather [name]    Medication Review:   APIXABAN (ELIQUIS)  Apixaban 10 mg BID initiated on 08/16/20. Will switch to apixaban 5 mg BID after 7 days (DATE).  - Discussed importance of taking medication around the same time everyday  - Reviewed potential DDIs with patient  - Advised patient of medications to avoid (NSAIDs, ASA)  - Educated that Tylenol (acetaminophen) will be the preferred analgesic to prevent risk of bleeding  - Emphasized importance of monitoring for signs and symptoms of bleeding (abnormal bruising, prolonged bleeding, nose bleeds, bleeding from gums, discolored urine, black tarry stools)  - Advised patient to alert all providers of anticoagulation therapy prior to starting a new medication or having a procedure    CLOPIDOGREL (PLAVIX) Clopidogrel 75 mg once daily.  - Educated patient on expected duration of therapy of  with clopidogrel. Advised patient that aspirin will be continued indefinitely.  - Reviewed potential DDIs with patient  - Advised patient of medications to avoid (NSAIDs, ASA)  - Educated that Tylenol (acetaminophen) will be the preferred analgesic to prevent risk of bleeding  - Emphasized importance of monitoring for signs and symptoms of bleeding (abnormal bruising, prolonged bleeding, nose bleeds, bleeding from gums, discolored urine, black tarry  stools)  - Advised patient to alert all providers of anticoagulation therapy prior to starting a new medication or having a procedure   Follow-up Appointments:  PCP Hospital f/u appt confirmed? Yes  Specialist Hospital f/u appt confirmed? Yes Scheduled  on 08/31/20   If their condition worsens, is the pt aware to call PCP or go to the Emergency Dept.? Yes  Final Patient Assessment: Spoke with patient, she reports she is doing well but still tired and having some diarrhea.  Had a follow up visit with the cardiologist and said all was well.  She was told being tired was normal and will improve.  MD wants her to contact him in a week if diarrhea continues.  Reviewed new medication regimen, possible side effects to be aware of.  Also reviewed proper use of NTG.

## 2020-09-07 NOTE — Chronic Care Management (AMB) (Signed)
  Care Management   Outreach Note  09/07/2020 Name: Heather [name] MRN: [id number] DOB: [birth year]  Referred by: Sharion Settler, DO Reason for referral : Care Coordination (Initial outreach to schedule referral with Licensed Clinical SW )   A second unsuccessful telephone outreach was attempted today. The patient was referred to the case management team for assistance with care management and care coordination.   Follow Up Plan: A HIPAA compliant phone message was left for the patient providing contact information and requesting a return call. The care management team will reach out to the patient again over the next 7 days.  If patient returns call to provider office, please advise to call [place] at [phone].  [place] Management

## 2020-09-08 ENCOUNTER — Other Ambulatory Visit: Payer: Self-pay

## 2020-09-08 DIAGNOSIS — Z006 Encounter for examination for normal comparison and control in clinical research program: Secondary | ICD-10-CM

## 2020-09-08 NOTE — Research (Addendum)
Subject # [id number] Amgen Lp(a) [id number] Site # [phone]  SEX '[]'    Female                      '[x]'    Female  Ethnicity '[]'   Hispanic or Latino   '[x]'   Not Hispanic or Latino  Race '[]'   White                 '[x]'   Black or African American  '[]'   Asian '[]'   American [place] or [place] Native            '[]'   Native Hawaiian or Other Pacific Islander                      '[]'   Other  Other   Age 70  Subject Group '[x]'    Local Lab           '[]'   Historical Lp(a) value                                       Results:  Future research '[x]'    Yes                    '[]'   No    Amgen [id number] Site # [id number] Subject ID # [id number]         ELIGIBILITY CRITERIA WORKSHEET INCLUSION CRITERIA   Subject has provided informed consent prior to the initiation of any study specific activities/procedures '[x]'   Age 47 to 50 years '[x]'   MI (presumed type 1) OR '[x]'   PCI (with high-risk features) with at least 1 of the following: '[]'   Age >65 '[]'   Diabetes mellitus '[]'   History of ischemic stroke '[]'   History of peripheral arterial disease '[]'   Residual stenosis ? 50% '[]'   Multivessel PCI (ie, ? 2 vessels, including branch arteries '[]'   EXCLUSIONS THE FOLLOWING N/A '[x]'   Subjects known to be currently receiving investigational drug in a clinical study that is anticipated to last > 1 year '[]'   Known Lp(a) value <57m/dL or < 200nmol/L '[]'   Subject has a diagnosis of end-stage renal disease or requires dialysis. '[]'   Poorly controlled (glycated hemoglobin [HbA1c] > 10%) diabetes mellitus (type 1 or type 2) '[]'   Subject is receiving or has received lipoprotein apheresis to reduce Lp(a) within 3 months prior to enrollment. '[]'   Known uncontrolled or recurrent ventricular tachycardia in the past 3 months prior to enrollment. '[]'   Known malignancy (except non-melanoma skin cancers, cervical in situ carcinoma, breast ductal carcinoma in situ, or stage 1 prostate carcinoma) within the last 5 years prior to enrollment. '[]'   Known history or  evidence of clinically significant disease (eg, respiratory, gastrointestinal, or psychiatric disease) or unstable disorder or biomarker that, in the opinion of the investigator(s), would result in life expectancy < 5 years. '[]'   Known hemorrhagic stroke. '[]'    AMGEN Lp(a) Informed Consent   Subject Name: Heather [name]  Subject met inclusion and exclusion criteria.  The informed consent form, study requirements and expectations were reviewed with the subject and questions and concerns were addressed prior to the signing of the consent form.  The subject verbalized understanding of the trial requirements.  The subject agreed to participate in the [facility] trial and signed the informed consent at 1443 on 09/08/2020.  The informed consent was obtained prior to performance of  any protocol-specific procedures for the subject.  A copy of the signed informed consent was given to the subject and a copy was placed in the subject's medical record.   Heather [name]  Amgem Consent Version 2 Protocol Version 2   As the principal investigator of the Amgen trial evaluating [place] at [facility], I have reviewed the patients history, indications for the trial, labwork and other factors and find that they meet criteria for enrollment in the study. The patient was provided, reviewed and comprehended written consent to participate in the study and is agreeable to proceed.  [name], MD, [facility], [place] Director of the Advanced Lipid Disorders &  Cardiovascular Risk Reduction Clinic Diplomate of the American Board of Clinical Lipidology Attending Cardiologist  Direct Dial: [phone]  Fax: [phone]  Website:  [email or web address]

## 2020-09-08 NOTE — Chronic Care Management (AMB) (Signed)
  Care Management   Note  09/08/2020 Name: Heather [name] MRN: [id number] DOB: 1950/07/07  Heather [name] is a 70 y.o. year old female who is a primary care patient of Sharion Settler, DO. I reached out to [employer] by phone today in response to a referral sent by Heather [name]'s PCP, .Sharion Settler, DO    Heather [name] was given information about care management services today including:  Care management services include personalized support from designated clinical staff supervised by her physician, including individualized plan of care and coordination with other care providers 24/7 contact phone numbers for assistance for urgent and routine care needs. The patient may stop care management services at any time by phone call to the office staff.  Patient agreed to services and verbal consent obtained.   Follow up plan: Telephone appointment with care management team member scheduled for:09/20/2020  [place] Management

## 2020-09-09 LAB — LIPOPROTEIN A (LPA): Lipoprotein (a): 16.9 nmol/L (ref <75.0)

## 2020-09-12 ENCOUNTER — Encounter: Payer: Self-pay | Admitting: *Deleted

## 2020-09-15 ENCOUNTER — Other Ambulatory Visit: Payer: Self-pay

## 2020-09-15 MED ORDER — ATORVASTATIN CALCIUM 80 MG PO TABS: 80.0000 mg | ORAL_TABLET | Freq: Every day | ORAL | 2 refills | Status: DC

## 2020-09-15 MED ORDER — OLMESARTAN MEDOXOMIL 20 MG PO TABS: 20.0000 mg | ORAL_TABLET | Freq: Every day | ORAL | 2 refills | Status: DC

## 2020-09-15 NOTE — Telephone Encounter (Signed)
Rx(s) sent to pharmacy electronically.  

## 2020-09-20 ENCOUNTER — Ambulatory Visit: Payer: Medicare (Managed Care) | Admitting: Licensed Clinical Social Worker

## 2020-09-20 DIAGNOSIS — Z139 Encounter for screening, unspecified: Secondary | ICD-10-CM

## 2020-09-20 DIAGNOSIS — Z789 Other specified health status: Secondary | ICD-10-CM

## 2020-09-20 NOTE — Chronic Care Management (AMB) (Signed)
Care Management Clinical Social Work Note  09/20/2020 Name: Heather [name] MRN: [id number] DOB: 04-Sep-1950  Heather [name] is a 70 y.o. year old female who is a primary care patient of Sharion Settler, DO.  The Care Management team was consulted for assistance with chronic disease management and coordination needs.  Engaged with patient by telephone for initial visit in response to provider referral for social work chronic care management and care coordination services  Consent to Services:  Ms. Andreoni was given information about Care Management services today including:  Care Management services includes personalized support from designated clinical staff supervised by her physician, including individualized plan of care and coordination with other care providers 24/7 contact phone numbers for assistance for urgent and routine care needs. The patient may stop case management services at any time by phone call to the office staff.  Patient agreed to services and consent obtained.   Assessment: Patient is engaged in conversation. She is working PT and has [place]. Having difficulty locating affordable housing. See Care Plan below for interventions and patient self-care actives.  Recent life changes /stressors: homeless for the past 3 years; was living in shelter 2 of those years; now temp living in a motel.  Recommendation: Patient may benefit from, and is in agreement to contact resources provided during this encounter.   Follow up Plan: No follow up scheduled with CCM team at this time. Will follow up with patient in 2 to 3 weeks .   Review of patient past medical history, allergies, medications, and health status, including review of relevant consultants reports was performed today as part of a comprehensive evaluation and provision of chronic care management and care coordination services.  SDOH (Social Determinants of Health) assessments and interventions performed:  SDOH  Interventions    Flowsheet Row Most Recent Value  SDOH Interventions   Food Insecurity Interventions Other (Comment)  Housing Interventions [id number] Referral        Advanced Directives Status: Not addressed in this encounter.  Care Plan  No Known Allergies  Outpatient Encounter Medications as of 09/20/2020  Medication Sig   apixaban (ELIQUIS) 5 MG TABS tablet Take 1 tablet (5 mg total) by mouth 2 (two) times daily.   atorvastatin (LIPITOR) 80 MG tablet Take 1 tablet (80 mg total) by mouth daily.   blood glucose meter kit and supplies KIT Dispense based on patient and insurance preference. Use up to four times daily as directed.   blood glucose meter kit and supplies KIT Dispense based on patient and insurance preference. Use up to four times daily as directed.   carvedilol (COREG) 3.125 MG tablet Take 1 tablet (3.125 mg total) by mouth 2 (two) times daily with a meal.   clopidogrel (PLAVIX) 75 MG tablet Take 1 tablet (75 mg total) by mouth daily with breakfast.   diclofenac Sodium (VOLTAREN) 1 % GEL Apply 2-4 g topically 4 (four) times daily.   insulin degludec (TRESIBA FLEXTOUCH) 200 UNIT/ML FlexTouch Pen Inject 32 Units into the skin daily.   metFORMIN (GLUCOPHAGE-XR) 500 MG 24 hr tablet Take 1 tablet (500 mg total) by mouth in the morning and at bedtime.   Needles & Syringes MISC 1 Container by Does not apply route daily.   nitroGLYCERIN (NITROSTAT) 0.4 MG SL tablet Place 1 tablet (0.4 mg total) under the tongue every 5 (five) minutes as needed for chest pain.   olmesartan (BENICAR) 20 MG tablet Take 1 tablet (20 mg total) by mouth daily.  No facility-administered encounter medications on file as of 09/20/2020.    Patient Active Problem List   Diagnosis Date Noted   GI symptoms 08/23/2020   NSTEMI (non-ST elevated myocardial infarction) (HCC)    PAF (paroxysmal atrial fibrillation) ([place])    Coronary artery disease involving native coronary artery of native heart with  unstable angina pectoris [facility])    Housing instability 01/27/2020   Depression 04/14/2019   Epigastric pain 04/14/2019   AKI (acute kidney injury) ([place]) 02/13/2019   GERD (gastroesophageal reflux disease) 05/08/2018   Abn findings on dx imaging of abd regions, inc retroperiton 05/08/2018   HLD (hyperlipidemia) 04/13/2018   HTN (hypertension) 01/01/2016   Type 2 diabetes mellitus with complication, with long-term current use of insulin ([place]) 01/01/2016   Neuropathy 01/01/2016    Conditions to be addressed/monitored: Homelessness; Limited social support and Housing barriers  Care Plan : General Social Work (Adult)  Updates made by Maurine Cane, LCSW since 09/20/2020 12:00 AM   Problem: SDOH/ Housing    Long-Range Goal: Connect with resources to provide affordable housing options   Start Date: 09/20/2020  This Visit's Progress: On track  Priority: High  Current barriers:    Financial constraints related to fixed income SSA, Limited social support, Housing barriers, and Lacks knowledge of community resource: to assist with housing Clinical Goals: Patient will connect and work with agencies discussed to address needs related to housing Clinical Interventions:  Collaboration with Sharion Settler, DO regarding development and update of comprehensive plan of care as evidenced by provider attestation and co-signature Inter-disciplinary care team collaboration (see longitudinal plan of care) Assessment of needs, barriers , agencies contacted, as well as how impacting  Review various resources, discussed options and provided patient information about  Housing resources (Partnership Ending Homelessness: Coordinated Entry [place], and [place] Management  Referral for [place] Strategies, Active listening / Reflection utilized , and [place]  Patient Goals/Self-Care Activities: Over the next 30 days Call  Department of Social Services [phone] to apply for food stamps Follow up with Partnership Ending Homelessness: Coordinated Entry [phone] and ffordable Housing Management [phone]   I have places a referral  with [employer] for [place], [place] / [place]   [phone] 2:36 PM

## 2020-09-20 NOTE — Patient Instructions (Signed)
Licensed Clinical Social Worker Visit Information  Goals we discussed today:   Goals Addressed             This Visit's Progress    Find Help in My Community       Timeframe:  Long-Range Goal Priority:  High Start Date:     09/20/2020                        Expected End Date:                        Patient Goals/Self-Care Activities: Over the next 30 days Call [place] [phone] to apply for food stamps Follow up with Partnership Ending Homelessness: Coordinated Entry [phone] and ffordable Housing Management [phone]   I have places a referral  with [employer] for [employer]   Why is this important?   Knowing how and where to find help for yourself or family in your neighborhood and community is an important skill.         Ms. Huet was given information about Care Management services today including:  Care Management services include personalized support from designated clinical staff supervised by her physician, including individualized plan of care and coordination with other care providers 24/7 contact phone numbers for assistance for urgent and routine care needs. The patient may stop Care Management services at any time by phone call to the office staff.  Patient agreed to services and verbal consent obtained.   Patient verbalizes understanding of instructions provided today and agrees to view in [place].   Follow up plan: SW will follow up with patient by phone over the next 2  to 3 weeks  Casimer Lanius, [place]

## 2020-09-29 ENCOUNTER — Telehealth (HOSPITAL_COMMUNITY): Payer: Self-pay

## 2020-09-29 NOTE — Telephone Encounter (Signed)
Pt is not interested in the cardiac rehab prorgam. Closed referral.

## 2020-10-06 ENCOUNTER — Other Ambulatory Visit: Payer: Self-pay | Admitting: Student in an Organized Health Care Education/Training Program

## 2020-10-09 ENCOUNTER — Ambulatory Visit: Payer: Self-pay | Admitting: Licensed Clinical Social Worker

## 2020-10-09 NOTE — Patient Instructions (Signed)
   Ms Veltri I tried to reach you by phone to see if there is anything else that I can assist you with/   Please call me if I can assist with anything else.  Casimer Lanius, LCSW Care Management & Coordination  [phone]

## 2020-10-09 NOTE — Chronic Care Management (AMB) (Signed)
    Clinical Social Work  Care Management   Phone Outreach    10/09/2020 Name: Heather [name] MRN: [id number] DOB: 23-Jun-1950  Heather [name] is a 70 y.o. year old female who is a primary care patient of Sharion Settler, DO .   Reason for referral: [employer] . For housing   F/U phone call today to assess needs, progress and barriers with care plan goals.   Telephone outreach was unsuccessful A HIPPA compliant phone message was left for the patient providing contact information and requesting a return call.   Collaboration :[employer] has connected with patient and provided her with housing resources.  Plan:CCM LCSW will wait for return call. If no return call is received, Will route chart to Care Guide to see if patient would like to reschedule phone appointment   Review of patient status, including review of consultants reports, relevant laboratory and other test results, and collaboration with appropriate care team members and the patient's provider was performed as part of comprehensive patient evaluation and provision of care management services.    Heather [name], [place] / [place]   [phone] 9:10 AM

## 2020-10-12 ENCOUNTER — Ambulatory Visit: Payer: Medicare (Managed Care) | Admitting: Licensed Clinical Social Worker

## 2020-10-12 DIAGNOSIS — Z59819 Housing instability, housed unspecified: Secondary | ICD-10-CM

## 2020-10-12 DIAGNOSIS — Z139 Encounter for screening, unspecified: Secondary | ICD-10-CM

## 2020-10-12 DIAGNOSIS — Z599 Problem related to housing and economic circumstances, unspecified: Secondary | ICD-10-CM

## 2020-10-12 NOTE — Chronic Care Management (AMB) (Signed)
Care Management Clinical Social Work Note  10/12/2020 Name: Heather [name] MRN: [id number] DOB: 08-23-1950  Heather [name] is a 70 y.o. year old female who is a primary care patient of Sharion Settler, DO.  The Care Management team was consulted for assistance with coordination needs.   Consent to Services:  The patient was given information about Care Management services, agreed to services, and gave verbal consent prior to initiation of services.  Please see initial visit note for detailed documentation.   Patient agreed to services today and consent obtained.   Assessment: Engaged with patient by phone in response to provider referral for social work care coordination services: [employer] .   Patient continues to experience difficulty with locating housing.. See Care Plan below for interventions and patient self-care actives. Recent life changes or stressors: continues to live in a hotel.  Recommendation: Patient may benefit from, and is in agreement continue to follow up on resources provided.   Follow up Plan: Patient does not require or desire continued follow-up by CCM LCSW. Will contact the office if needed Patient may benefit from and is in agreement for CCM LCSW to remain part of care team for the next 60 days.  If no needs are identified in the next 60 days, CCM LCSW will disconnect from the care team.    Review of patient past medical history, allergies, medications, and health status, including review of relevant consultants reports was performed today as part of a comprehensive evaluation and provision of chronic care management and care coordination services.  SDOH (Social Determinants of Health) assessments and interventions performed:    Advanced Directives Status: Not addressed in this encounter.  Care Plan  No Known Allergies  Outpatient Encounter Medications as of 10/12/2020  Medication Sig   apixaban (ELIQUIS) 5 MG TABS tablet Take 1 tablet (5 mg total) by  mouth 2 (two) times daily.   atorvastatin (LIPITOR) 80 MG tablet Take 1 tablet (80 mg total) by mouth daily.   blood glucose meter kit and supplies KIT Dispense based on patient and insurance preference. Use up to four times daily as directed.   blood glucose meter kit and supplies KIT Dispense based on patient and insurance preference. Use up to four times daily as directed.   carvedilol (COREG) 3.125 MG tablet Take 1 tablet (3.125 mg total) by mouth 2 (two) times daily with a meal.   clopidogrel (PLAVIX) 75 MG tablet Take 1 tablet (75 mg total) by mouth daily with breakfast.   diclofenac Sodium (VOLTAREN) 1 % GEL Apply 2-4 g topically 4 (four) times daily.   insulin degludec (TRESIBA FLEXTOUCH) 200 UNIT/ML FlexTouch Pen Inject 32 Units into the skin daily.   metFORMIN (GLUCOPHAGE-XR) 500 MG 24 hr tablet TAKE 1 TABLET BY MOUTH TWICE A DAY   Needles & Syringes MISC 1 Container by Does not apply route daily.   nitroGLYCERIN (NITROSTAT) 0.4 MG SL tablet Place 1 tablet (0.4 mg total) under the tongue every 5 (five) minutes as needed for chest pain.   olmesartan (BENICAR) 20 MG tablet Take 1 tablet (20 mg total) by mouth daily.   No facility-administered encounter medications on file as of 10/12/2020.    Patient Active Problem List   Diagnosis Date Noted   GI symptoms 08/23/2020   NSTEMI (non-ST elevated myocardial infarction) (HCC)    PAF (paroxysmal atrial fibrillation) ([place])    Coronary artery disease involving native coronary artery of native heart with unstable angina pectoris ([place])  Housing instability 01/27/2020   Depression 04/14/2019   Epigastric pain 04/14/2019   AKI (acute kidney injury) ([place]) 02/13/2019   GERD (gastroesophageal reflux disease) 05/08/2018   Abn findings on dx imaging of abd regions, inc retroperiton 05/08/2018   HLD (hyperlipidemia) 04/13/2018   HTN (hypertension) 01/01/2016   Type 2 diabetes mellitus with complication, with long-term current use of insulin  ([place]) 01/01/2016   Neuropathy 01/01/2016    Conditions to be addressed/monitored: ; Housing barriers  Care Plan : General Social Work (Adult)  Updates made by Maurine Cane, LCSW since 10/12/2020 12:00 AM   Problem: SDOH/ Housing    Long-Range Goal: Connect with resources to provide affordable housing options   Start Date: 09/20/2020  This Visit's Progress: On track  Recent Progress: On track  Priority: High  Current barriers:   Has not called resources provided by [employer] as she did not receive the e-mail that was mailed 09/20/2020 Financial constraints related to fixed income SSA, Limited social support, Housing barriers, and Lacks knowledge of community resource: to assist with housing Clinical Goals: Patient will connect and work with agencies discussed to address needs related to housing Clinical Interventions:  Inter-disciplinary care team collaboration (see longitudinal plan of care) Assessment of needs, barriers , agencies contacted, as well as how impacting  Review various resources, discussed options and provided patient information about  Housing resources (Partnership Ending Homelessness: Coordinated Entry [place], and [place] patient a complete list of housing resources ( list in AVS) Referral for [employer](They have connected with patient and provided housing resource information) [employer] Strategies, Active listening / Reflection utilized , and [place]  Patient Goals/Self-Care Activities: Over the next 30 days Call Department of Social Services [phone] to apply for food stamps Follow up with Partnership Ending Homelessness: Coordinated Entry [phone] and affordable Housing Management [place], [place] / [place]   [phone] 9:58 AM

## 2020-10-12 NOTE — Patient Instructions (Signed)
Visit Information   Goals Addressed             This Visit's Progress    Find Help in My Community   Not on track    Timeframe:  Long-Range Goal Priority:  High Start Date:     09/20/2020                        Expected End Date:                        Patient Goals/Self-Care Activities: Over the next 30 days Call [place] [phone] to apply for food stamps Follow up with Partnership Ending Homelessness: Coordinated Entry [phone] and ffordable Housing Management [phone]    Housing Resources: [employer]:   [email or web address]-[email or web address]  [place],   [place] Therapist, [email or web address] [place]icker   [phone]  [place]:  [phone]     [place], [place], [place] [address]    [place]:  [phone]     [place], [place], [place] [address]    Affordable Housing Management:  [phone]  [email or web address]  [address], [place] [address] [place], [place] [address]  Partnership Ending Homelessness: Chiropractor [facility]) [phone]   [address] [place]    [employer]: [facility] By appointment only - Call [place] ([place])   [place] [address] [phone]  [facility]  [address] [phone]  [place]  [phone]       Patient verbalizes understanding of instructions provided today.   No follow up scheduled. Will remain part of your care team for the next 60 days. Please call the office prior to our next encounter if needs arise   Casimer Lanius, [place] Management & Coordination  [phone]

## 2020-10-18 ENCOUNTER — Other Ambulatory Visit: Payer: Self-pay | Admitting: Student in an Organized Health Care Education/Training Program

## 2020-10-24 ENCOUNTER — Other Ambulatory Visit: Payer: Self-pay

## 2020-10-24 ENCOUNTER — Other Ambulatory Visit (HOSPITAL_COMMUNITY): Payer: Self-pay

## 2020-10-24 ENCOUNTER — Ambulatory Visit (INDEPENDENT_AMBULATORY_CARE_PROVIDER_SITE_OTHER): Payer: Medicare (Managed Care) | Admitting: Pharmacist

## 2020-10-24 DIAGNOSIS — Z79899 Other long term (current) drug therapy: Secondary | ICD-10-CM

## 2020-10-24 NOTE — Progress Notes (Signed)
   Subjective:    Patient ID: Heather [name], female    DOB: [birth year], 70 y.o.   MRN: [id number]  HPI Patient is a 70 y.o. female who presents for diabetes management. She is in good spirits and presents without assistance. Patient was referred and last seen by provider, Dr. Maudie Mercury, on 08/30/20.  Patient initially presented to discuss CGM and diabetes but upon review of medications it was clear patient was not sure what she should/should not be taking. She had several bottles of different strengths of metformin, atorvastatin, and olmesartan. She also stated she needed refills of her eliquis and plavix as she was out of her eliquis. She is not sure what she is supposed to be taking and cannot verbalized which medications she actually is taking.  Objective:   Labs:   Physical Exam Neurological:     Mental Status: She is alert and oriented to person, place, and time.    Review of Systems  Respiratory:  Negative for shortness of breath.   Cardiovascular:  Negative for chest pain.   Lab Results  Component Value Date   HGBA1C 7.9 (H) 08/15/2020   HGBA1C 8.1 (H) 08/14/2020   HGBA1C 8.1 (A) 08/02/2020    Lab Results  Component Value Date   MICRALBCREAT 30-300 10/02/2018    Lipid Panel     Component Value Date/Time   CHOL 224 (H) 08/15/2020 0335   CHOL 259 (H) 08/02/2020 0923   TRIG 99 08/15/2020 0335   HDL 63 08/15/2020 0335   HDL 69 08/02/2020 0923   CHOLHDL 3.6 08/15/2020 0335   VLDL 20 08/15/2020 0335   LDLCALC 141 (H) 08/15/2020 0335   LDLCALC 166 (H) 08/02/2020 0923    Clinical Atherosclerotic Cardiovascular Disease (ASCVD): Yes  The ASCVD Risk score Mikey Bussing DC Jr., et al., 2013) failed to calculate for the following reasons:   The patient has a prior MI or stroke diagnosis   Assessment/Plan:   Patient has barriers to medication adherence including lack of understanding of medications. [facility] for her and since  they did not refill her plavix or eliquis yet that is why she was not aware she had refills at the pharmacy. Provided patient phone number to set up [facility] to improve patient adherence and understanding of regimen. Disposed of incorrect bottles of medications with incorrect doses. Instructed patient to [facility] to [facility] Plavix and Eliquis. Patient to call clinic if she has any issues accessing medications.  Follow-up appointment one month to discuss diabetes. Written patient instructions provided.  This appointment required 35 minutes of direct patient care.  Thank you for involving pharmacy to assist in providing this patient's care.

## 2020-10-24 NOTE — Patient Instructions (Signed)
Heather [name] it was a pleasure seeing you today.   Today we reviewed all of the medications you are currently taking. Included is an updated medication list. Please continue taking all medications as prescribed on this list.  To help you remember to take your medications:  - [facility] at [phone]  You need refills on your Eliquis and Plavix. If there are issues with those prescriptions please call us and let us know.   Follow-up with me in one month

## 2020-10-26 ENCOUNTER — Telehealth: Payer: Self-pay | Admitting: Student

## 2020-10-26 NOTE — Telephone Encounter (Signed)
New Message::      She needs documentation stating that the patient needs to be on Plavix and Eliquis.      S

## 2020-10-26 NOTE — Telephone Encounter (Signed)
Returned call to [facility] and spoke with Legrand Como. I asked what type of documentation was needed that patient needs to be on Plavix and  Eliquis. Legrand Como stated that I can verbally confirm that patient is still on the medication. I informed him based off of patient's last office note with her cardiologist that patient is still on Plavix (Clopidogrel) 75 mg daily and the Eliquis (Apixaban) 5 mg 2 times a day. He asked to confirm that this is Dr. Sarajane Jews office. I informed him that Sande Rives, PA-C is a Librarian, academic and that Ms. Swartz Cardiologist is Dr. Irish Lack who she last saw in July. He asked the name of the office which I was calling from, stated that it is [place] and that my name is Elizah Lydon A., CMA. He thanked me for calling and will update the information.

## 2020-11-01 ENCOUNTER — Other Ambulatory Visit: Payer: Self-pay | Admitting: Student in an Organized Health Care Education/Training Program

## 2020-11-01 DIAGNOSIS — E11628 Type 2 diabetes mellitus with other skin complications: Secondary | ICD-10-CM

## 2020-11-21 ENCOUNTER — Ambulatory Visit (INDEPENDENT_AMBULATORY_CARE_PROVIDER_SITE_OTHER): Payer: Medicare (Managed Care) | Admitting: Pharmacist

## 2020-11-21 ENCOUNTER — Other Ambulatory Visit: Payer: Self-pay

## 2020-11-21 DIAGNOSIS — Z794 Long term (current) use of insulin: Secondary | ICD-10-CM | POA: Diagnosis not present

## 2020-11-21 DIAGNOSIS — E118 Type 2 diabetes mellitus with unspecified complications: Secondary | ICD-10-CM

## 2020-11-21 NOTE — Patient Instructions (Signed)
Heather [name] it was a pleasure seeing you today.   Please do the following:  Start Ozempic 0.25 mg once a week. Continue taking your metformin and [name] as directed today during your appointment. If you have any questions or if you believe something has occurred because of this change, call me or your doctor to let one of Heather [name] know.  Continue checking blood sugars at home. It's really important that you record these and bring these in to your next doctor's appointment.  Continue making the lifestyle changes we've discussed together during our visit. Diet and exercise play a significant role in improving your blood sugars.  [facility] see about the carvedilol and statin. We will call you to let you know of the plan. Follow-up with me/PCP in 6 weeks.    Hypoglycemia or low blood sugar:   Low blood sugar can happen quickly and may become an emergency if not treated right away.   While this shouldn't happen often, it can be brought upon if you skip a meal or do not eat enough. Also, if your insulin or other diabetes medications are dosed too high, this can cause your blood sugar to go to low.   Warning signs of low blood sugar include: Feeling shaky or dizzy Feeling weak or tired  Excessive hunger Feeling anxious or upset  Sweating even when you aren't exercising  What to do if I experience low blood sugar? Follow the Rule of 15 Check your blood sugar with your meter. If lower than 70, proceed to step 2.  Treat with 15 grams of fast acting carbs which is found in 3-4 glucose tablets. If none are available you can try hard candy, 1 tablespoon of sugar or honey,4 ounces of fruit juice, or 6 ounces of REGULAR soda.  Re-check your sugar in 15 minutes. If it is still below 70, do what you did in step 2 again. If your blood sugar has come back up, go ahead and eat a snack or small meal made up of complex carbs (ex. Whole grains) and protein at this time to avoid recurrence of low  blood sugar.

## 2020-11-21 NOTE — Progress Notes (Addendum)
Subjective:    Patient ID: Heather [name], female    DOB: May 09, 1950, 70 y.o.   MRN: [id number]  HPI Patient is a 70 y.o. female who presents for diabetes management. She is in good spirits and presents without assistance. Patient was referred on 08/30/20 and last seen by Primary Care Provider on 08/30/2020. She was last seen in pharmacy clinic for medication review on 10/24/2020.  PMH significant for HTN, PAF, CAD, NSTEMI, HLD.   Patient reports diabetes was diagnosed in 1990's.   Insurance coverage/medication affordability: [employer] Medicare  Family/Social history: Aunt on mom's side has diabetes, no hx of thyroid cancer, aunt on mom had DM, some distant relatives  Current diabetes medications include: metformin 500 mg BID, insulin degludec 32 units daily Current hypertension medications include: olmesartan 20 mg, carvedilol 3.125 mg BID (not in pill pack) Current hyperlipidemia medications include: atorvastatin 80 mg daily (not in pill pack)  Patient states that She is taking her medications as prescribed. Patient reports adherence with medications-especially since [facility]. Patient states that She misses her medications 1-2 times per week, on average.  Do you feel that your medications are working for you?  Unsure - says she doesn't feel a difference  Have you been experiencing any side effects to the medications prescribed? No-doing a lot better with the lower dose metformin  Do you have any problems obtaining medications due to transportation or finances?  no    Patient reported dietary habits:  Eats 1-2 meals/day  Breakfast:skips breakfast mostly Lunch: sometimes will eat lunch or skip; if at work will have salad Dinner: Reports currently living in a hotel and mostly eating fast food; will get [employer]jangles or popeyes, chinese buffet - fried rice, green beans, some of the shrimp with brocoli, sushi Drinks: mostly water, sometimes a soda  Patient-reported exercise  habits: not exercising, is in food service, works at [employer] in [place]   Patient denies hypoglycemic events Patient denies polyuria (increased urination) Patient denies polyphagia (increased appetite) Patient denies polydipsia (increased thirst) Patient reports neuropathy (nerve pain) reports "sensation" and tingling in the feet. She reports going to the foot doctor to check up but procedure was too expensive; reports swollen ankles most of the time; reports the voltaren helps somewhat Patient denies visual changes. Reports going to the eye doctor and that they recommended she get the glaucoma test soon. Currently wearing OTC readers and eye doctor will follow up on need to get RX glasses Patient denies self foot exams.   7 day avg: 128 14-day avg: 129 CBGs: 136, 132, 147, 116, 135, 112, 116, 128, 136, 134, 157, 107, 115, 126  Objective:   Labs:   Physical Exam Neurological:     Mental Status: She is alert and oriented to person, place, and time.     Lab Results  Component Value Date   HGBA1C 7.9 (H) 08/15/2020   HGBA1C 8.1 (H) 08/14/2020   HGBA1C 8.1 (A) 08/02/2020    There were no vitals filed for this visit.  Lab Results  Component Value Date   MICRALBCREAT 30-300 10/02/2018    Lipid Panel     Component Value Date/Time   CHOL 224 (H) 08/15/2020 0335   CHOL 259 (H) 08/02/2020 0923   TRIG 99 08/15/2020 0335   HDL 63 08/15/2020 0335   HDL 69 08/02/2020 0923   CHOLHDL 3.6 08/15/2020 0335   VLDL 20 08/15/2020 0335   LDLCALC 141 (H) 08/15/2020 0335   LDLCALC 166 (  H) 08/02/2020 4128    Clinical Atherosclerotic Cardiovascular Disease (ASCVD): Yes  The ASCVD Risk score (Arnett DK, et al., 2019) failed to calculate for the following reasons:   The patient has a prior MI or stroke diagnosis   Assessment/Plan:   T2DM is somewhat controlled based on blood glucose readings provided. Medication adherence appears optimal. Additional pharmacotherapy is needed  for improved controlled and weight loss. Patient with a history of intolerance to Victoza (liraglutide) due to GI distress. Will initiate Ozempic (semaglutide) 0.25 mg once a week. Will plan to titrate slow given GI intolerance to liraglutide before. Benefits of medication include weight loss and cardiovascular benefit. Patient educated on purpose, proper use and potential adverse effects of medication.  Following instruction patient verbalized understanding of treatment plan.    Continued basal insulin Tresiba (insulin degludec) 32 units daily.  Started GLP-1 Ozempic (generic name semaglutide) 0.25 mg SQ once a week.   Continued metformin 500 mg BID Extensively discussed pathophysiology of diabetes, dietary effects on blood sugar control, and recommended lifestyle interventions,  Patient will adhere to dietary modifications. Discussed substituting grilled chicken options instead of fried chicken when she gets fast food Counseled on s/sx of and management of hypoglycemia Next A1C anticipated October 2022.   Will plan [facility] to see about adding carvedilol and statin to next pill pack. If package has been sent, will call patient and instruct to pick up prescriptions from local pharmacy.  Follow-up appointment January 08, 2021 to review sugar readings. Written patient instructions provided.  This appointment required 30 minutes of patient care (this includes precharting, chart review, review of results, and face-to-face care).  Thank you for involving pharmacy to assist in providing this patient's care.  Patient seen with Meyer Russel, PharmD Candidate and Elita Quick, PharmD - PGY1 Pharmacy Resident

## 2020-11-22 MED ORDER — OZEMPIC (0.25 OR 0.5 MG/DOSE) 2 MG/1.5ML ~~LOC~~ SOPN: 0.2500 mg | PEN_INJECTOR | SUBCUTANEOUS | 0 refills | Status: DC

## 2020-11-22 NOTE — Assessment & Plan Note (Signed)
T2DM is somewhat controlled based on blood glucose readings provided. Medication adherence appears optimal. Additional pharmacotherapy is needed for improved controlled and weight loss. Patient with a history of intolerance to Victoza (liraglutide) due to GI distress. Will initiate Ozempic (semaglutide) 0.25 mg once a week. Will plan to titrate slow given GI intolerance to liraglutide before. Benefits of medication include weight loss and cardiovascular benefit. Patient educated on purpose, proper use and potential adverse effects of medication.  Following instruction patient verbalized understanding of treatment plan.    1. Continued basal insulin Tresiba (insulin degludec) 32 units daily.  2. Started GLP-1 Ozempic (generic name semaglutide) 0.25 mg SQ once a week.  3.  Continued metformin 500 mg BID 4. Extensively discussed pathophysiology of diabetes, dietary effects on blood sugar control, and recommended lifestyle interventions,  5. Patient will adhere to dietary modifications. Discussed substituting grilled chicken options instead of fried chicken when she gets fast food 6. Counseled on s/sx of and management of hypoglycemia 7. Next A1C anticipated October 2022.   Will plan to [facility] to see about adding carvedilol and statin to next pill pack. If package has been sent, will call patient and instruct to pick up prescriptions from local pharmacy.

## 2020-11-22 NOTE — Progress Notes (Signed)
Called [facility] and they stated account was de-activated today. Attempted to call patient. Awaiting return phone call.

## 2020-11-23 ENCOUNTER — Telehealth: Payer: Self-pay

## 2020-11-23 NOTE — Telephone Encounter (Signed)
Patient did not answer. Left voicemail requesting call back and provided my direct phone number.

## 2020-11-23 NOTE — Telephone Encounter (Signed)
Patient calls nurse line returning phone call to [place]. Please return call to patient at [phone].  Talbot Grumbling, RN

## 2020-11-24 ENCOUNTER — Other Ambulatory Visit: Payer: Self-pay | Admitting: Student

## 2020-11-24 ENCOUNTER — Other Ambulatory Visit: Payer: Self-pay | Admitting: Family Medicine

## 2020-11-24 ENCOUNTER — Other Ambulatory Visit: Payer: Self-pay | Admitting: Student in an Organized Health Care Education/Training Program

## 2020-11-24 NOTE — Telephone Encounter (Signed)
Prescription refill request for Eliquis received. Indication:atrial fib Last office visit:7/22 Scr:1.5 Age: 70 Weight:93.6 kg  Prescription refilled

## 2020-11-27 ENCOUNTER — Other Ambulatory Visit: Payer: Self-pay

## 2020-11-27 MED ORDER — ATORVASTATIN CALCIUM 80 MG PO TABS: 80.0000 mg | ORAL_TABLET | Freq: Every day | ORAL | 2 refills | Status: DC

## 2020-11-27 MED ORDER — OLMESARTAN MEDOXOMIL 20 MG PO TABS: 20.0000 mg | ORAL_TABLET | Freq: Every day | ORAL | 2 refills | Status: DC

## 2020-11-27 MED ORDER — CARVEDILOL 3.125 MG PO TABS: 3.1250 mg | ORAL_TABLET | Freq: Two times a day (BID) | ORAL | 1 refills | Status: DC

## 2020-11-27 NOTE — Telephone Encounter (Signed)
Pharmacy calls nurse line requesting refills for Atorvastatin, Carvedilol, and Olmesartan. Pharmacy reports she has lost these medication and needs refills. Please advise.

## 2020-11-27 NOTE — Telephone Encounter (Signed)
I called patient to inform her refills. Patient reports we are supposed to state what time of day she takes each. Patient is not home right now and unsure which ones are AM and PM. Patient will call me back to inform. I will update the pharmacy.

## 2020-11-30 ENCOUNTER — Other Ambulatory Visit: Payer: Self-pay

## 2020-11-30 ENCOUNTER — Other Ambulatory Visit: Payer: Medicare (Managed Care)

## 2020-11-30 DIAGNOSIS — I10 Essential (primary) hypertension: Secondary | ICD-10-CM

## 2020-11-30 DIAGNOSIS — I480 Paroxysmal atrial fibrillation: Secondary | ICD-10-CM

## 2020-11-30 DIAGNOSIS — E1159 Type 2 diabetes mellitus with other circulatory complications: Secondary | ICD-10-CM

## 2020-11-30 DIAGNOSIS — I25118 Atherosclerotic heart disease of native coronary artery with other forms of angina pectoris: Secondary | ICD-10-CM

## 2020-11-30 DIAGNOSIS — E782 Mixed hyperlipidemia: Secondary | ICD-10-CM

## 2020-11-30 LAB — LIPID PANEL
Chol/HDL Ratio: 4.4 ratio (ref 0.0–4.4)
Cholesterol, Total: 285 mg/dL — ABNORMAL HIGH (ref 100–199)
HDL: 65 mg/dL (ref >39)
LDL Chol Calc (NIH): 195 mg/dL — ABNORMAL HIGH (ref 0–99)
Triglycerides: 137 mg/dL (ref 0–149)
VLDL Cholesterol Cal: 25 mg/dL (ref 5–40)

## 2020-11-30 LAB — COMPREHENSIVE METABOLIC PANEL
ALT: 12 IU/L (ref 0–32)
AST: 13 IU/L (ref 0–40)
Albumin/Globulin Ratio: 1.1 — ABNORMAL LOW (ref 1.2–2.2)
Albumin: 3.5 g/dL — ABNORMAL LOW (ref 3.8–4.8)
Alkaline Phosphatase: 101 IU/L (ref 44–121)
BUN/Creatinine Ratio: 17 (ref 12–28)
BUN: 23 mg/dL (ref 8–27)
Bilirubin Total: 0.2 mg/dL (ref 0.0–1.2)
CO2: 21 mmol/L (ref 20–29)
Calcium: 8.8 mg/dL (ref 8.7–10.3)
Chloride: 104 mmol/L (ref 96–106)
Creatinine, Ser: 1.33 mg/dL — ABNORMAL HIGH (ref 0.57–1.00)
Globulin, Total: 3.1 g/dL (ref 1.5–4.5)
Glucose: 134 mg/dL — ABNORMAL HIGH (ref 70–99)
Potassium: 4.4 mmol/L (ref 3.5–5.2)
Sodium: 139 mmol/L (ref 134–144)
Total Protein: 6.6 g/dL (ref 6.0–8.5)
eGFR: 43 mL/min/{1.73_m2} — ABNORMAL LOW (ref >59)
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
## 2020-12-01 ENCOUNTER — Telehealth: Payer: Self-pay | Admitting: Licensed Clinical Social Worker

## 2020-12-01 ENCOUNTER — Ambulatory Visit (INDEPENDENT_AMBULATORY_CARE_PROVIDER_SITE_OTHER): Payer: Medicare (Managed Care) | Admitting: Interventional Cardiology

## 2020-12-01 ENCOUNTER — Encounter: Payer: Self-pay | Admitting: Interventional Cardiology

## 2020-12-01 VITALS — BP 144/78 | HR 93 | Ht 67.0 in | Wt 210.4 lb

## 2020-12-01 DIAGNOSIS — I252 Old myocardial infarction: Secondary | ICD-10-CM

## 2020-12-01 DIAGNOSIS — E782 Mixed hyperlipidemia: Secondary | ICD-10-CM | POA: Diagnosis not present

## 2020-12-01 DIAGNOSIS — E1159 Type 2 diabetes mellitus with other circulatory complications: Secondary | ICD-10-CM

## 2020-12-01 DIAGNOSIS — I25118 Atherosclerotic heart disease of native coronary artery with other forms of angina pectoris: Secondary | ICD-10-CM

## 2020-12-01 DIAGNOSIS — I10 Essential (primary) hypertension: Secondary | ICD-10-CM | POA: Diagnosis not present

## 2020-12-01 NOTE — Patient Instructions (Signed)
Medication Instructions:  Your physician recommends that you continue on your current medications as directed. Please refer to the Current Medication list given to you today.  *If you need a refill on your cardiac medications before your next appointment, please call your pharmacy*   Lab Work: none If you have labs (blood work) drawn today and your tests are completely normal, you will receive your results only by: [place] (if you have MyChart) OR A paper copy in the mail If you have any lab test that is abnormal or we need to change your treatment, we will call you to review the results.   Testing/Procedures: none   Follow-Up: At [facility], you and your health needs are our priority.  As part of our continuing mission to provide you with exceptional heart care, we have created designated Provider Care Teams.  These Care Teams include your primary Cardiologist (physician) and Advanced Practice Providers (APPs -  Physician Assistants and Nurse Practitioners) who all work together to provide you with the care you need, when you need it.  We recommend signing up for the patient portal called "MyChart".  Sign up information is provided on this After Visit Summary.  MyChart is used to connect with patients for Virtual Visits (Telemedicine).  Patients are able to view lab/test results, encounter notes, upcoming appointments, etc.  Non-urgent messages can be sent to your provider as well.   To learn more about what you can do with MyChart, go to [email or web address].    Your next appointment:   6 month(s)  The format for your next appointment:   In Person  Provider:   You may see Larae Grooms, MD or one of the following Advanced Practice Providers on your designated Care Team:   Melina Copa, PA-C Ermalinda Barrios, PA-C   Other Instructions  You have been referred to lipid clinic in our office.  Please schedule new patient appointment

## 2020-12-01 NOTE — Telephone Encounter (Signed)
CSW received referral to assist patient with housing assistance. Casimer Lanius, LCSW from [place] already involved with patient and has made multiple referrals for housing related needs. CSW reach out to [facility] for further intervention. CSW available if needed. Raquel Sarna, [place], [place]
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
## 2020-12-11 ENCOUNTER — Ambulatory Visit: Payer: Medicare (Managed Care)

## 2020-12-11 ENCOUNTER — Other Ambulatory Visit: Payer: Self-pay

## 2020-12-11 DIAGNOSIS — E782 Mixed hyperlipidemia: Secondary | ICD-10-CM

## 2020-12-11 NOTE — Progress Notes (Signed)
Patient ID: Heather [name]                 DOB: 12-26-1950                    MRN: [id number]     HPI: Heather [name] is a 70 y.o. female patient referred to lipid clinic by Dr Irish Lack. PMH is significant for single vessel CAD with NSTEMI s/p DES (08/15/2020), a fib, insulin dependent T2DM c/b neuropathy, HLD, and HTN. Of note she was living in the shelter in the past and social work is consulted to help with housing assistance. On most recent visit with Dr. Irish Lack on 12/01/2020, LDL had increased to 195. She [facility] [place]   Patient presents today in good spirits. She explains to me she lost her medications in June and was not able to have the carvedilol and atorvastatin refilled until the 90 days had passed due to her insurance. She was able to get these medications refilled again on 10/3. I reviewed the medication packets she brought in and all medications on her med list were confirmed, except the carvedilol, which the patient did not receive despite it being refilled the same day as atorvastatin (11/27/20), which was in the packets.  We discussed her most recent lipid panel and her LDL goal of < 55 mg/dL and the need for additional lipid lowering medications. She does not pay anything for her medications except for her insulin due to her low income insurance subsidy.   When asked about medication issues she notes that she experienced diarrhea on 10/12-10/14 and asked me about any medications that could contribute. She states she did not take her medications on 10/16 and felt better, but took them all today knowing she needs to take them. After reviewing her medication list I believe this is due to the [place] which is recently new to her and explained that the medication causes patients to feel fuller and slows down the food in the GI tract.   Current Medications: atorvastatin 4m daily Risk Factors: CAD, T2DM, HTN LDL goal: <55 mg/dL  Diet:  living in hotel, so picking up food, salads at work, drinks a lot of water a work, works 2 days a week  Exercise: in food service, on feet all day, has foot problems, needs to go to foot doctor so hard  Family History: The patient's family history includes alcohol abuse in her father and mother; CAD in her maternal grandmother; Diabetes in her father; Stroke in her mother  Social History: The patient  reports that she has never smoked. She has never used smokeless tobacco. She reports that she does not drink alcohol and does not use drugs.   Labs: 11/30/2020: TC 285, LDL 195, HLD 65, TG 137 (not on meds, had lost atorva 80 for ~90 days) 10/22/19: TC 151, LDL 78, HLD 54, TG 107 (on atorva 40) 09/08/2020: lipoprotein (a) 16.9 - normal  Past Medical History:  Diagnosis Date   Abscess, gluteal, right 05/25/2016   Diabetes mellitus    Encounter for hepatitis C screening test for low risk patient 04/13/2018   Screening 04/13/2018   Food insecurity 04/13/2018   Hyperlipidemia    Hypertension    Medicare annual wellness visit, initial 04/13/2018   Patient is able to have AWV initial   Neuropathy    Other chest pain 01/01/2016   Screening for breast cancer 04/13/2018   Sepsis ([place] 05/25/2016  Tibial plateau fracture, left 02/16/2015   Uncontrolled diabetes mellitus with diabetic neuropathy, with long-term current use of insulin 01/01/2016    Current Outpatient Medications on File Prior to Visit  Medication Sig Dispense Refill   atorvastatin (LIPITOR) 80 MG tablet Take 1 tablet (80 mg total) by mouth daily. 90 tablet 2   blood glucose meter kit and supplies KIT Dispense based on patient and insurance preference. Use up to four times daily as directed. 1 each 0   blood glucose meter kit and supplies KIT Dispense based on patient and insurance preference. Use up to four times daily as directed. 1 each 0   carvedilol (COREG) 3.125 MG tablet Take 1 tablet (3.125 mg total) by mouth 2 (two) times daily  with a meal. 180 tablet 1   clopidogrel (PLAVIX) 75 MG tablet TAKE 1 TABLET EVERY DAY WITH BREAKFAST 30 tablet 3   diclofenac Sodium (VOLTAREN) 1 % GEL Apply 2-4 g topically 4 (four) times daily.     ELIQUIS 5 MG TABS tablet TAKE 1 TABLET TWICE DAILY 60 tablet 4   insulin degludec (TRESIBA FLEXTOUCH) 200 UNIT/ML FlexTouch Pen Inject 32 Units into the skin daily. 27 mL 3   Lancets (ONETOUCH DELICA PLUS NLGXQJ19E) MISC USE AS DIRECTED 100 each 3   metFORMIN (GLUCOPHAGE-XR) 500 MG 24 hr tablet TAKE 1 TABLET BY MOUTH TWICE A DAY 60 tablet 2   Needles & Syringes MISC 1 Container by Does not apply route daily. 90 each 2   nitroGLYCERIN (NITROSTAT) 0.4 MG SL tablet Place 1 tablet (0.4 mg total) under the tongue every 5 (five) minutes as needed for chest pain. 25 tablet 2   olmesartan (BENICAR) 20 MG tablet Take 1 tablet (20 mg total) by mouth daily. 90 tablet 2   Semaglutide,0.25 or 0.5MG/DOS, (OZEMPIC, 0.25 OR 0.5 MG/DOSE,) 2 MG/1.5ML SOPN Inject 0.25 mg into the skin once a week. 1.5 mL 0   No current facility-administered medications on file prior to visit.    No Known Allergies  Assessment/Plan:  1. Hyperlipidemia - LDL currently elevated at 195 secondary to pt losing her atorvastatin, above goal of <55 mg/dL given hx of ASCVD + DM. Has now been back on atorvastatin 58m daily for the past week. Discussed additional lipid lowering therapy to bring LDL to goal including Praulent (preferred [id number] on formulary) and Leqvio as she prefers injectable therapy. I will follow up with the patient after insurance paperwork is finalized about which injectable medication she will start. Will have her recheck follow-up labs in 8-12 weeks now that she is back on the atorvastatin and once started on injectable lipid lower medication. Counseled the patient to try to walk on days she doesn't work and [employer]ices while eating out.    2. No carvedilol RX - [facility]  until [facility]. Since she lost it the team will try and get an insurance override so that it can be filled now until her next pill box ships in November. [facility] will [facility] the [facility] to inform her of this. I have confirmed her phone number with them.   Patient seen with MFuller [place] PharmD, BCACP, CPP.   JCathrine Muster PharmD PGY2 Cardiology Pharmacy Resident 12/11/2020  2:59 PM

## 2020-12-11 NOTE — Patient Instructions (Addendum)
Your LDL on your last lab check was 195. We want to get that down <55 mg/dL. Continue taking your atorvastatin 80 mg a day.   Today we discussed injectable lipid lowering medications- Praulent an injection you give yourself every 2 weeks (lowers LDL by 60%) and Leqvio an injection that would be administered at the hospital once now then at 3 months, and every 6 months after that (lowers LDL by 50%).  We will call you with information on which lipid lowering injection we will start based upon insurance coverage.   Please schedule follow-up labs in 8-12 weeks now that you are on the atorvastatin again. We will send a refill of the carvedilol to the pharmacy for you.   Try to continue to walk and days you don't work and make smart food choices while eating out.

## 2020-12-12 ENCOUNTER — Other Ambulatory Visit: Payer: Self-pay | Admitting: Family Medicine

## 2020-12-12 ENCOUNTER — Ambulatory Visit (INDEPENDENT_AMBULATORY_CARE_PROVIDER_SITE_OTHER): Payer: Medicare (Managed Care) | Admitting: Family Medicine

## 2020-12-12 ENCOUNTER — Encounter: Payer: Self-pay | Admitting: Family Medicine

## 2020-12-12 ENCOUNTER — Telehealth: Payer: Self-pay | Admitting: Family Medicine

## 2020-12-12 ENCOUNTER — Other Ambulatory Visit: Payer: Self-pay

## 2020-12-12 VITALS — BP 151/84 | HR 86 | Wt 209.0 lb

## 2020-12-12 DIAGNOSIS — I10 Essential (primary) hypertension: Secondary | ICD-10-CM

## 2020-12-12 DIAGNOSIS — E1149 Type 2 diabetes mellitus with other diabetic neurological complication: Secondary | ICD-10-CM

## 2020-12-12 DIAGNOSIS — I480 Paroxysmal atrial fibrillation: Secondary | ICD-10-CM | POA: Diagnosis not present

## 2020-12-12 DIAGNOSIS — Z23 Encounter for immunization: Secondary | ICD-10-CM | POA: Insufficient documentation

## 2020-12-12 DIAGNOSIS — Z794 Long term (current) use of insulin: Secondary | ICD-10-CM

## 2020-12-12 DIAGNOSIS — E782 Mixed hyperlipidemia: Secondary | ICD-10-CM

## 2020-12-12 DIAGNOSIS — E1140 Type 2 diabetes mellitus with diabetic neuropathy, unspecified: Secondary | ICD-10-CM | POA: Insufficient documentation

## 2020-12-12 DIAGNOSIS — E118 Type 2 diabetes mellitus with unspecified complications: Secondary | ICD-10-CM

## 2020-12-12 LAB — POCT GLYCOSYLATED HEMOGLOBIN (HGB A1C): HbA1c, POC (controlled diabetic range): 8.3 % — AB (ref 0.0–7.0)

## 2020-12-12 MED ORDER — EMPAGLIFLOZIN 10 MG PO TABS: 10.0000 mg | ORAL_TABLET | Freq: Every day | ORAL | 3 refills | Status: DC

## 2020-12-12 MED ORDER — DULOXETINE HCL 40 MG PO CPEP: 40.0000 mg | ORAL_CAPSULE | Freq: Every day | ORAL | 3 refills | Status: DC

## 2020-12-12 NOTE — Assessment & Plan Note (Signed)
Normal sinus rhythm today. -Continue Eliquis

## 2020-12-12 NOTE — Telephone Encounter (Signed)
Discussed with [facility] (pharmacy in [place] that provides pill packing for patient) regarding patients newly prescribed medications. They state that the Cymbalta and Jardiance will be added to her next pill pack in November.  Additionally, they state that the carvedilol will also be included in future pill packs.  The reason that it was not previously filled was because the pharmacy was unaware that she had lost the medication until after the pill pack was sent.  It is the patient's responsibility to approve the order prior to being sent, so there may have been some misunderstanding of her medications.   This pharmacy stated that they will contact the local pharmacy for a partial fill of all 3 of these medications (cymbalta, jardiance, carvedilol) in the interim.  [facility] Phone Number: [phone]

## 2020-12-12 NOTE — Patient Instructions (Addendum)
It was wonderful to meet you today.  Please bring ALL of your medications with you to every visit.   Today we talked about:  -Your Hemoglobin A1c was 8.3. The goal is to get it under 7. Continue your Ozempic 0.25 mg. We are starting a new medication called Jardiance. Take this daily.  -I'm starting you on a medication called Cymbalta to help your Neuropathy.  -Continue your efforts to find a Podiatrist.  -Your cholesterol was elevated. It is important that you take your cholesterol medication daily.  -You are receiving your Flu shot today.  Thank you for choosing [facility].   Please call [phone] with any questions about today's appointment.  Please be sure to schedule follow up at the front  desk before you leave today.   Sharion Settler, DO PGY-2 Family Medicine

## 2020-12-12 NOTE — Assessment & Plan Note (Signed)
Hemoglobin A1c 8.3 today, worsened from last A1c that was 7.9.  She is currently on metformin and Ozempic.  She is up-to-date on eye examination. She is on a high intensity statin and ARB. -Continue to monitor sugars daily -Start Jardiance 10 mg daily -Continue Ozempic 0.25 mg weekly; have room to increase in a couple weeks (new medication for patient) -Continue metformin 500 mg BID

## 2020-12-12 NOTE — Assessment & Plan Note (Signed)
Received today

## 2020-12-12 NOTE — Assessment & Plan Note (Signed)
Currently on atorvastatin 80 mg daily.  This was evident in her pill pack today.  She follows with cardiology and they seem to be managing her cholesterol primarily.  She saw her cardiologist yesterday, states that they are considering injectables to help with her cholesterol. -Continue atorvastatin 80 mg daily -Further management per cardiology

## 2020-12-12 NOTE — Assessment & Plan Note (Addendum)
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  No history of falls. Foot examination today with evidence of bunions b/l but no charcot joint or open wounds/ulcers. Fingernails are thickened. She would benefit from podiatry referral, however this is been unsuccessful in the past due to her insurance.  Patient was previously advised to look for a Podiatrist on her own.  She has previously been trialed on gabapentin which did not help her pain.  Given this, will start Cymbalta to see if that will help.  I also discussed improved glucose control which could help to prevent progression. -Start Cymbalta 40 mg daily; can increase to 60 mg if tolerating well

## 2020-12-12 NOTE — Assessment & Plan Note (Signed)
Blood pressure elevated today, improved on repeat check 151/84.  Denies any chest pain, shortness of breath, lower extremity edema.  Pill pack today included olmesartan 20 mg, but did not include carvedilol 3.125 mg.  Appears there may have been issues with the pharmacy.  Patient should be on beta-blocker given her history of CAD, additionally with poorly controlled blood pressure. -We will discuss with pharmacy regarding carvedilol prescription

## 2020-12-13 ENCOUNTER — Telehealth: Payer: Self-pay | Admitting: Pharmacist

## 2020-12-13 DIAGNOSIS — E782 Mixed hyperlipidemia: Secondary | ICD-10-CM

## 2020-12-13 MED ORDER — PRALUENT 75 MG/ML ~~LOC~~ SOAJ: 1.0000 "pen " | SUBCUTANEOUS | 3 refills | Status: DC

## 2020-12-13 NOTE — Telephone Encounter (Addendum)
Praluent PA approved until further notice. Leqvio covered at 80% copay until pt reaches $4500 out of pocket max, so Praluent will be more affordable (pt reports having low income subsidy, her Eliquis, Ozempic and Jardiance are affordable).  Spoke with pt, Praluent rx sent to her pharmacy. Scheduled f/u labs in 2 months to assess efficacy.

## 2020-12-26 ENCOUNTER — Telehealth: Payer: Self-pay

## 2020-12-26 NOTE — Telephone Encounter (Signed)
Patient LVM on nurse line concerning decreased blood glucose levels. Reports that blood sugar levels have been running in the 50's and 60's. Patient also reports GI upset and fatigue since being on new medications.   Attempted to call patient back. No answer. Left HIPAA compliant VM for patient to return call.   Talbot Grumbling, RN

## 2020-12-26 NOTE — Telephone Encounter (Signed)
Patient returns call to nurse line regarding previous message.   Patient reports that she has been having low blood sugar levels for the last week. States these have been ranging in the 50s and 60s.   Reports last glucose level was at 53. Patient reports that she has felt tired and nauseous over the last week. Only symptom currently present is fatigue. Denies jitteriness, dizziness, excessive hunger, anxiety or diaphoresis.   Patient is not able to check blood sugar at this time, as she is currently at work. Advised patient of the rule of 15 and to proceed to urgent care or the ED if she became symptomatic or if blood sugar levels do not improve after intake of carbs. Patient verbalizes understanding.   Scheduled patient appointment with Ernst Bowler for tomorrow to manage diabetic medications.   Please advise of any additional recommendations until appointment tomorrow.   Talbot Grumbling, RN

## 2020-12-26 NOTE — Telephone Encounter (Signed)
Spoke with Dr. Valentina Lucks regarding patient. Advised that if patient has not taken [place], for patient to decrease dose to 26 units.  Called patient. Patient reports taking 32 units of Tresiba this AM. While speaking with patient, patient checked blood sugar. Reading was 162. Patient reports this was after lunch. Patient had chicken sandwich and sweet tea for lunch.   Reinforced ED precautions and encouraged patient to keep follow up tomorrow afternoon. Dr. Valentina Lucks agreed with above plan of care.   Talbot Grumbling, RN

## 2020-12-27 ENCOUNTER — Other Ambulatory Visit: Payer: Self-pay

## 2020-12-27 ENCOUNTER — Ambulatory Visit (INDEPENDENT_AMBULATORY_CARE_PROVIDER_SITE_OTHER): Payer: Medicare (Managed Care) | Admitting: Pharmacist

## 2020-12-27 DIAGNOSIS — E118 Type 2 diabetes mellitus with unspecified complications: Secondary | ICD-10-CM | POA: Diagnosis not present

## 2020-12-27 DIAGNOSIS — Z794 Long term (current) use of insulin: Secondary | ICD-10-CM

## 2020-12-27 NOTE — Progress Notes (Signed)
Subjective:    Patient ID: Heather [name], female    DOB: 1950-05-01, 70 y.o.   MRN: [id number]  HPI Patient is a 70 y.o. female who presents for diabetes management. She is in good spirits and presents without assistance. Patient was referred on 08/30/20 and last seen by Primary Care Provider on 12/12/20. She was last seen in pharmacy clinic for medication review on 11/21/20.  Patient states she is not sure what is happening with her medications but that she feels nauseous, tired, and has had low blood sugar readings since all of the changes to her medication regimen. Patient does not report nausea with Ozempic and recalls tolerating the medication well. She believes all of this occurred with her more recent medication changes. Patient recently initiated on Jardiance, Praluent, and Duloxetine and then Ozempic three weeks prior.   PMH significant for HTN, PAF, CAD, NSTEMI, HLD.   Patient reports diabetes was diagnosed in 1990's.   Insurance coverage/medication affordability: [employer]  Family/Social history: Aunt on mom's side has diabetes, no hx of thyroid cancer, aunt on mom had DM, some distant relatives  Current diabetes medications include: metformin 500 mg BID, insulin degludec 32 units daily (did not take this morning due to frequent hypoglycemic events), Ozempic 0.25mg  once weekly (completed 5 doses), Jardiance 10mg  once daily Current hypertension medications include: olmesartan 20 mg, carvedilol 3.125 mg BID  Current hyperlipidemia medications include: atorvastatin 80 mg daily   Patient states that She is taking her medications as prescribed. Patient reports adherence with medications-especially since using the [facility] simple dose. Patient states that She misses her medications 1-2 times per week, on average.  Do you feel that your medications are working for you?  Unsure - says she doesn't feel a difference  Have you been experiencing any side effects to the medications prescribed?  Patient reports experiencing nausea  Do you have any problems obtaining medications due to transportation or finances?  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 She reports going to the foot doctor to check up but procedure was too expensive; reports swollen ankles most of the time; reports the voltaren helps somewhat Patient denies visual changes. Reports going to the eye doctor and that they recommended she get the glaucoma test soon. Currently wearing OTC readers and eye doctor will follow up on need to get RX glasses Patient reports self foot exams.   Objective:   Fasting blood glucose readings: 89 (while in office visit), 129, 143, 119, 111, 113, 98, 103, 83 (Readings post addition of Jardiance) 114, ,54, 78, 101, 83, 31  Labs:   Physical Exam Neurological:     Mental Status: She is alert and oriented to person, place, and time.     Lab Results  Component Value Date   HGBA1C 8.3 (A) 12/12/2020   HGBA1C 7.9 (H) 08/15/2020   HGBA1C 8.1 (H)  08/14/2020   Lab Results  Component Value Date   MICRALBCREAT 30-300 10/02/2018    Lipid Panel     Component Value Date/Time   CHOL 285 (H) 11/30/2020 0751   TRIG 137 11/30/2020 0751   HDL 65 11/30/2020 0751   CHOLHDL 4.4  11/30/2020 0751   CHOLHDL 3.6 08/15/2020 0335   VLDL 20 08/15/2020 0335   LDLCALC 195 (H) 11/30/2020 0751    Clinical Atherosclerotic Cardiovascular Disease (ASCVD): Yes  The ASCVD Risk score (Arnett DK, et al., 2019) failed to calculate for the following reasons:   The patient has a prior MI or stroke diagnosis   Assessment/Plan:   T2DM is controlled based on blood glucose readings provided, and needs adjustments due to recent frequent hypoglycemia. Patient had blood glucose of 89 without taking insulin today and after eating lunch earlier in the day. Medication adherence appears optimal. Will discontinue duloxetine for the time being as this may be culprit of fatigue and nausea. Will re-assess after patient discontinues. If patient is feeling better by Sunday and nausea has minimized instructed patient to titrate up Ozempic to 0.5mg . Will discontinue insulin for the time being as patient has had such frequent and severe hypoglycemic events with no identifiable cause. Instructed patient if blood glucose begins to be >200 fasting she may re-start Tresiba at 10 units once daily. Following instruction patient verbalized understanding of treatment plan.    Discontinued basal insulin Tresiba (insulin degludec) 32 units daily.  Increasing GLP-1 Ozempic (generic name semaglutide) to 0.5 mg SQ once a week.  Continued metformin 500 mg BID Continued SGLT2 Jardiance 10mg  once daily Discontinued duloxetine 40mg  once daily Extensively discussed pathophysiology of diabetes, dietary effects on blood sugar control, and recommended lifestyle interventions,  Patient will adhere to dietary modifications. Discussed substituting grilled chicken options instead of fried chicken when she gets fast food Counseled on s/sx of and management of hypoglycemia Next A1C anticipated January 2023.   Follow-up appointment January 08, 2021 to review sugar readings. Written patient instructions provided.  This appointment  required 30 minutes of direct patient care.  Thank you for involving pharmacy to assist in providing this patient's care.  Patient seen with Elyse Jarvis, PharmD Candidate.

## 2020-12-27 NOTE — Assessment & Plan Note (Signed)
T2DM is controlled based on blood glucose readings provided, and needs adjustments due to recent frequent hypoglycemia. Patient had blood glucose of 89 without taking insulin today and after eating lunch earlier in the day. Medication adherence appears optimal. Will discontinue duloxetine for the time being as this may be culprit of fatigue and nausea. Will re-assess after patient discontinues. If patient is feeling better by Sunday and nausea has minimized instructed patient to titrate up Ozempic to 0.5mg . Will discontinue insulin for the time being as patient has had such frequent and severe hypoglycemic events with no identifiable cause. Instructed patient if blood glucose begins to be >200 fasting she may re-start Tresiba at 10 units once daily. Following instruction patient verbalized understanding of treatment plan.    1. Discontinued basal insulin Tresiba (insulin degludec) 32 units daily.  2. Increasing GLP-1 Ozempic (generic name semaglutide) to 0.5 mg SQ once a week.  3. Continued metformin 500 mg BID 4. Continued SGLT2 Jardiance 10mg  once daily 5. Discontinued duloxetine 40mg  once daily 6. Extensively discussed pathophysiology of diabetes, dietary effects on blood sugar control, and recommended lifestyle interventions,  7. Patient will adhere to dietary modifications. Discussed substituting grilled chicken options instead of fried chicken when she gets fast food 8. Counseled on s/sx of and management of hypoglycemia 9. Next A1C anticipated January 2023.   Follow-up appointment January 08, 2021 to review sugar readings. Written patient instructions provided.

## 2020-12-27 NOTE — Patient Instructions (Signed)
Heather [name] it was a pleasure seeing you today.   Please do the following:  Stop your insulin and your duloxetine as directed today during your appointment. If you have any questions or if you believe something has occurred because of this change, call me or your doctor to let one of [place] know.  If you start having blood glucose readings in the morning >200 you may resume your insulin at 10 units once daily until you follow-up with me Increase your Ozempic to 0.5mg  starting this Sunday Continue checking blood sugars at home. It's really important that you record these and bring these in to your next doctor's appointment.  Continue making the lifestyle changes we've discussed together during our visit. Diet and exercise play a significant role in improving your blood sugars.  Follow-up with me on November 14th   Hypoglycemia or low blood sugar:   Low blood sugar can happen quickly and may become an emergency if not treated right away.   While this shouldn't happen often, it can be brought upon if you skip a meal or do not eat enough. Also, if your insulin or other diabetes medications are dosed too high, this can cause your blood sugar to go to low.   Warning signs of low blood sugar include: Feeling shaky or dizzy Feeling weak or tired  Excessive hunger Feeling anxious or upset  Sweating even when you aren't exercising  What to do if I experience low blood sugar? Follow the Rule of 15 Check your blood sugar with your meter. If lower than 70, proceed to step 2.  Treat with 15 grams of fast acting carbs which is found in 3-4 glucose tablets. If none are available you can try hard candy, 1 tablespoon of sugar or honey,4 ounces of fruit juice, or 6 ounces of REGULAR soda.  Re-check your sugar in 15 minutes. If it is still below 70, do what you did in step 2 again. If your blood sugar has come back up, go ahead and eat a snack or small meal made up of complex carbs (ex. Whole grains) and protein  at this time to avoid recurrence of low blood sugar.

## 2021-01-08 ENCOUNTER — Other Ambulatory Visit: Payer: Self-pay

## 2021-01-08 ENCOUNTER — Ambulatory Visit (INDEPENDENT_AMBULATORY_CARE_PROVIDER_SITE_OTHER): Payer: Medicare (Managed Care) | Admitting: Pharmacist

## 2021-01-08 DIAGNOSIS — E118 Type 2 diabetes mellitus with unspecified complications: Secondary | ICD-10-CM | POA: Diagnosis not present

## 2021-01-08 DIAGNOSIS — Z794 Long term (current) use of insulin: Secondary | ICD-10-CM | POA: Diagnosis not present

## 2021-01-08 MED ORDER — OZEMPIC (0.25 OR 0.5 MG/DOSE) 2 MG/1.5ML ~~LOC~~ SOPN: 0.5000 mg | PEN_INJECTOR | SUBCUTANEOUS | 0 refills | Status: DC

## 2021-01-08 NOTE — Progress Notes (Signed)
Subjective:    Patient ID: Heather [name], female    DOB: Oct 12, 1950, 70 y.o.   MRN: [id number]  HPI Patient is a 70 y.o. female who presents for diabetes management. She is in good spirits and presents without assistance. Patient was referred on 08/30/20 and last seen by Primary Care Provider on 12/12/20. She was last seen in pharmacy clinic on 12/27/20.  Patient reports she is feeling much better since discontinuing duloxetine and believes that was the culprit of her nausea and fatigue. She states she accidentally took it one day and felt nauseous and tired again, but other than that one day has felt back to normal. She was able to titrate up Ozempic to 0.5mg  once weekly. Patient reports no issues with Jardiance or Praluent which were prescribed around the same time as duloxetine.  PMH significant for HTN, PAF, CAD, NSTEMI, HLD.   Patient reports diabetes was diagnosed in 1990's.   Insurance coverage/medication affordability: [employer] Medicare  Family/Social history: Aunt on mom's side has diabetes, no hx of thyroid cancer, aunt on mom had DM, some distant relatives  Current diabetes medications include: metformin 500 mg BID, Ozempic 0.5mg  once weekly on Sundays (completed 2 doses), Jardiance 10mg  once daily Current hypertension medications include: olmesartan 20 mg, carvedilol 3.125 mg BID  Current hyperlipidemia medications include: atorvastatin 80 mg daily   Patient states that She is taking her medications as prescribed. Patient reports adherence with medications-especially since using the [facility] simple dose. Patient states that she has not missed her medications recently  Do you feel that your medications are working for you?  Yes  Have you been experiencing any side effects to the medications prescribed? no  Do you have any problems obtaining medications due to transportation or finances?  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 She reports going to the foot doctor to check up but procedure was too expensive; reports swollen ankles most of the time; reports the voltaren helps somewhat Patient denies visual changes.  Patient reports self foot exams.  Self-reported fasting blood glucose: 120's; highest 140  Objective:   Labs:   Physical Exam Neurological:     Mental Status: She is alert and oriented to person, place, and time.   Review of Systems  Gastrointestinal:  Negative for nausea and vomiting.    Lab Results  Component Value Date   HGBA1C 8.3 (A) 12/12/2020   HGBA1C 7.9 (H) 08/15/2020   HGBA1C 8.1 (H) 08/14/2020   Lab Results  Component Value Date   MICRALBCREAT 30-300 10/02/2018    Lipid Panel     Component Value Date/Time   CHOL 285 (H) 11/30/2020 0751   TRIG 137 11/30/2020 0751   HDL 65 11/30/2020 0751   CHOLHDL 4.4 11/30/2020 0751   CHOLHDL 3.6 08/15/2020 0335   VLDL 20 08/15/2020  0335   LDLCALC 195 (H) 11/30/2020 0751    Clinical Atherosclerotic Cardiovascular Disease (ASCVD): Yes  The ASCVD Risk score (Arnett DK, et al., 2019) failed to calculate for the following reasons:   The patient has a prior MI or stroke diagnosis   Assessment/Plan:   T2DM is controlled based on  blood glucose readings provided and patient no longer having hypoglycemic episodes. Will continue to hold insulin as glucose still well controlled without it. Medication adherence appears optimal. Will have patient continue Ozempic 0.5mg  for two additional doses and then titrate up to 1mg  once weekly. Will continue to hold duloxetine due to adverse events and patient would not like to take the medication. Patient taking duloxetine for neuropathy pain. Patient reports trying gabapentin in the past with tolerability issues. Will discuss at future visit. Following instruction patient verbalized understanding of treatment plan.    Continued GLP-1 Ozempic (generic name semaglutide) 0.5 mg SQ once a week and will titrate up to 1mg  Continued metformin 500 mg BID Continued SGLT2 Jardiance 10mg  once daily Extensively discussed pathophysiology of diabetes, dietary effects on blood sugar control, and recommended lifestyle interventions.  Patient will adhere to dietary modifications. Discussed substituting grilled chicken options instead of fried chicken when she gets fast food Counseled on s/sx of and management of hypoglycemia Next A1C anticipated January 2023.   Follow-up appointment three weeks to review sugar readings. Written patient instructions provided.  This appointment required 35 minutes of direct patient care.  Thank you for involving pharmacy to assist in providing this patient's care.

## 2021-01-08 NOTE — Patient Instructions (Addendum)
Heather [name] it was a pleasure seeing you today.   Please do the following:  Continue Ozempic 0.5mg  for the next two Sundays (20th and 27th) and then on December 4th inject 0.5mg  twice that day to make a total of 1mg . You will then go to pharmacy to pick up your new 1mg  pen Continue checking blood sugars at home. It's really important that you record these and bring these in to your next doctor's appointment.  Continue making the lifestyle changes we've discussed together during our visit. Diet and exercise play a significant role in improving your blood sugars.  Follow-up with me in three weeks  Hypoglycemia or low blood sugar:   Low blood sugar can happen quickly and may become an emergency if not treated right away.   While this shouldn't happen often, it can be brought upon if you skip a meal or do not eat enough. Also, if your insulin or other diabetes medications are dosed too high, this can cause your blood sugar to go to low.   Warning signs of low blood sugar include: Feeling shaky or dizzy Feeling weak or tired  Excessive hunger Feeling anxious or upset  Sweating even when you aren't exercising  What to do if I experience low blood sugar? Follow the Rule of 15 Check your blood sugar with your meter. If lower than 70, proceed to step 2.  Treat with 15 grams of fast acting carbs which is found in 3-4 glucose tablets. If none are available you can try hard candy, 1 tablespoon of sugar or honey,4 ounces of fruit juice, or 6 ounces of REGULAR soda.  Re-check your sugar in 15 minutes. If it is still below 70, do what you did in step 2 again. If your blood sugar has come back up, go ahead and eat a snack or small meal made up of complex carbs (ex. Whole grains) and protein at this time to avoid recurrence of low blood sugar.

## 2021-01-08 NOTE — Assessment & Plan Note (Signed)
T2DM is controlled based on blood glucose readings provided and patient no longer having hypoglycemic episodes. Will continue to hold insulin as glucose still well controlled without it. Medication adherence appears optimal. Will have patient continue Ozempic 0.5mg  for two additional doses and then titrate up to 1mg  once weekly. Will continue to hold duloxetine due to adverse events and patient would not like to take the medication. Patient taking duloxetine for neuropathy pain. Patient reports trying gabapentin in the past with tolerability issues. Will discuss at future visit. Following instruction patient verbalized understanding of treatment plan.    1. Continued GLP-1 Ozempic (generic name semaglutide) 0.5 mg SQ once a week and will titrate up to 1mg  2. Continued metformin 500 mg BID 3. Continued SGLT2 Jardiance 10mg  once daily 4. Extensively discussed pathophysiology of diabetes, dietary effects on blood sugar control, and recommended lifestyle interventions.  5. Patient will adhere to dietary modifications. Discussed substituting grilled chicken options instead of fried chicken when she gets fast food 6. Counseled on s/sx of and management of hypoglycemia 7. Next A1C anticipated January 2023.

## 2021-01-29 ENCOUNTER — Ambulatory Visit (INDEPENDENT_AMBULATORY_CARE_PROVIDER_SITE_OTHER): Payer: Medicare (Managed Care) | Admitting: Pharmacist

## 2021-01-29 ENCOUNTER — Other Ambulatory Visit: Payer: Self-pay

## 2021-01-29 DIAGNOSIS — E11628 Type 2 diabetes mellitus with other skin complications: Secondary | ICD-10-CM | POA: Diagnosis not present

## 2021-01-29 DIAGNOSIS — E118 Type 2 diabetes mellitus with unspecified complications: Secondary | ICD-10-CM | POA: Diagnosis not present

## 2021-01-29 DIAGNOSIS — Z794 Long term (current) use of insulin: Secondary | ICD-10-CM | POA: Diagnosis not present

## 2021-01-29 MED ORDER — OZEMPIC (1 MG/DOSE) 4 MG/3ML ~~LOC~~ SOPN: 1.0000 mg | PEN_INJECTOR | SUBCUTANEOUS | 0 refills | Status: DC

## 2021-01-29 MED ORDER — ONETOUCH DELICA PLUS LANCET33G MISC: 3 refills | Status: DC

## 2021-01-29 NOTE — Progress Notes (Signed)
Subjective:    Patient ID: Heather [name], female    DOB: 1951-01-04, 70 y.o.   MRN: [id number]  HPI Patient is a 70 y.o. female who presents for diabetes management. She is in good spirits and presents without assistance. Patient was referred on 08/30/20 and last seen by Primary Care Provider on 12/12/20. She was last seen in pharmacy clinic on 01/08/21.  Patient reports she has noticed an increase in appetite lately. She does state she has been out of her Ozempic 0.5mg  for one week after finishing her last pen due to pharmacy supply issues, but that they called her and stated it was ready for pick-up today.  PMH significant for HTN, PAF, CAD, NSTEMI, HLD.   Patient reports diabetes was diagnosed in 1990's.   Insurance coverage/medication affordability: [employer] Medicare  Family/Social history: Aunt on mom's side has diabetes, no hx of thyroid cancer, aunt on mom had DM, some distant relatives  Current diabetes medications include: metformin 500 mg BID, Ozempic 0.5mg  once weekly on Sundays (completed 2 doses), Jardiance 10mg  once daily Current hypertension medications include: olmesartan 20 mg, carvedilol 3.125 mg BID  Current hyperlipidemia medications include: atorvastatin 80 mg daily   Patient states that She is taking her medications as prescribed. Patient reports adherence with medications-especially since using the [facility] simple dose. Patient states that she has not missed her medications recently  Do you feel that your medications are working for you?  Yes  Have you been experiencing any side effects to the medications prescribed? no  Do you have any problems obtaining medications due to transportation or finances?  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 She reports going to the foot doctor to check up but procedure was too expensive; reports swollen ankles most of the time; reports the voltaren helps somewhat Patient denies visual changes.  Patient reports self foot exams.  Self-reported fasting blood glucose: average 120's; highest 170's  Objective:   Labs:   Physical Exam Neurological:     Mental Status: She is alert and oriented to person, place, and time.   Review of Systems  Gastrointestinal:  Negative for nausea and vomiting.    Lab Results  Component Value Date   HGBA1C 8.3 (A) 12/12/2020   HGBA1C 7.9 (H) 08/15/2020   HGBA1C 8.1 (H) 08/14/2020   Lab Results  Component Value Date   MICRALBCREAT 30-300 10/02/2018    Lipid Panel     Component Value Date/Time   CHOL 285 (H) 11/30/2020 0751   TRIG 137 11/30/2020 0751   HDL 65 11/30/2020 0751   CHOLHDL 4.4 11/30/2020 0751   CHOLHDL 3.6 08/15/2020 0335   VLDL 20 08/15/2020 0335   LDLCALC 195 (H) 11/30/2020 0751    Clinical Atherosclerotic Cardiovascular Disease (ASCVD): Yes  The ASCVD Risk score (Arnett DK, et al., 2019) failed to calculate for the following reasons:  The patient has a prior MI or stroke diagnosis   Assessment/Plan:   T2DM is mostly controlled based on blood glucose readings provided and patient no longer having hypoglycemic episodes. Will continue to hold insulin as glucose still controlled without it. Medication adherence appears optimal. Will send in  prescription for Ozempic 1mg  dose as patient due for titration after completing 4 doses with no tolerability issues. Instructed patient if pharmacy is unable to obtain 1mg  dose patient can continue on 0.5mg  dose for time-being until they are able to obtain. At future visit can also discuss titrating up Jardiance to 25mg  once daily. Following instruction patient verbalized understanding of treatment plan.    Increased dose of GLP-1 Ozempic (generic name semaglutide) to 1 mg SQ once a week Continued metformin 500 mg BID Continued SGLT2 Jardiance 10mg  once daily Extensively discussed pathophysiology of diabetes, dietary effects on blood sugar control, and recommended lifestyle interventions.  Patient will adhere to dietary modifications. Discussed substituting grilled chicken options instead of fried chicken when she gets fast food Counseled on s/sx of and management of hypoglycemia Next A1C anticipated January 2023.   Follow-up appointment in six weeks to review sugar readings. Written patient instructions provided.  This appointment required 30 minutes of direct patient care.  Thank you for involving pharmacy to assist in providing this patient's care.

## 2021-01-29 NOTE — Patient Instructions (Addendum)
Miss Difiore it was a pleasure seeing you today.   Please do the following:  Increase your Ozempic to 1mg  once weekly as directed today during your appointment. If you have any questions or if you believe something has occurred because of this change, call me or your doctor to let one of [place] know.  If you are not able to get the 1mg  Ozempic please continue taking Ozempic 0.5mg  as the pharmacies have been having difficulty getting th is medication Continue checking blood sugars at home. It's really important that you record these and bring these in to your next doctor's appointment.  Continue making the lifestyle changes we've discussed together during our visit. Diet and exercise play a significant role in improving your blood sugars.  Follow-up with Dr. Nita Sells next month and me in two months.  Please ask your pharmacy about olmesartan and atorvastatin as these should be in your pill pack  Hypoglycemia or low blood sugar:   Low blood sugar can happen quickly and may become an emergency if not treated right away.   While this shouldn't happen often, it can be brought upon if you skip a meal or do not eat enough. Also, if your insulin or other diabetes medications are dosed too high, this can cause your blood sugar to go to low.   Warning signs of low blood sugar include: Feeling shaky or dizzy Feeling weak or tired  Excessive hunger Feeling anxious or upset  Sweating even when you aren't exercising  What to do if I experience low blood sugar? Follow the Rule of 15 Check your blood sugar with your meter. If lower than 70, proceed to step 2.  Treat with 15 grams of fast acting carbs which is found in 3-4 glucose tablets. If none are available you can try hard candy, 1 tablespoon of sugar or honey,4 ounces of fruit juice, or 6 ounces of REGULAR soda.  Re-check your sugar in 15 minutes. If it is still below 70, do what you did in step 2 again. If your blood sugar has come back up, go ahead and  eat a snack or small meal made up of complex carbs (ex. Whole grains) and protein at this time to avoid recurrence of low blood sugar.

## 2021-01-29 NOTE — Assessment & Plan Note (Signed)
T2DM is mostly controlled based on blood glucose readings provided and patient no longer having hypoglycemic episodes. Will continue to hold insulin as glucose still controlled without it. Medication adherence appears optimal. Will send in prescription for Ozempic 1mg  dose as patient due for titration after completing 4 doses with no tolerability issues. Instructed patient if pharmacy is unable to obtain 1mg  dose patient can continue on 0.5mg  dose for time-being until they are able to obtain. At future visit can also discuss titrating up Jardiance to 25mg  once daily. Following instruction patient verbalized understanding of treatment plan.    1. Increased dose of GLP-1 Ozempic (generic name semaglutide) to 1 mg SQ once a week 2. Continued metformin 500 mg BID 3. Continued SGLT2 Jardiance 10mg  once daily 4. Extensively discussed pathophysiology of diabetes, dietary effects on blood sugar control, and recommended lifestyle interventions.  5. Patient will adhere to dietary modifications. Discussed substituting grilled chicken options instead of fried chicken when she gets fast food 6. Counseled on s/sx of and management of hypoglycemia 7. Next A1C anticipated January 2023.   Follow-up appointment in six weeks to review sugar readings

## 2021-02-03 ENCOUNTER — Other Ambulatory Visit: Payer: Self-pay | Admitting: Family Medicine

## 2021-02-05 ENCOUNTER — Other Ambulatory Visit: Payer: Medicare (Managed Care)

## 2021-02-12 ENCOUNTER — Other Ambulatory Visit: Payer: Medicare (Managed Care)

## 2021-02-12 ENCOUNTER — Other Ambulatory Visit: Payer: Self-pay

## 2021-02-12 DIAGNOSIS — E782 Mixed hyperlipidemia: Secondary | ICD-10-CM

## 2021-02-12 LAB — LIPID PANEL
Chol/HDL Ratio: 2.7 ratio (ref 0.0–4.4)
Cholesterol, Total: 197 mg/dL (ref 100–199)
HDL: 74 mg/dL (ref >39)
LDL Chol Calc (NIH): 97 mg/dL (ref 0–99)
Triglycerides: 152 mg/dL — ABNORMAL HIGH (ref 0–149)
VLDL Cholesterol Cal: 26 mg/dL (ref 5–40)

## 2021-02-12 LAB — HEPATIC FUNCTION PANEL
ALT: 23 IU/L (ref 0–32)
AST: 15 IU/L (ref 0–40)
Albumin: 3.7 g/dL — ABNORMAL LOW (ref 3.8–4.8)
Alkaline Phosphatase: 107 IU/L (ref 44–121)
Bilirubin Total: 0.3 mg/dL (ref 0.0–1.2)
Bilirubin, Direct: 0.12 mg/dL (ref 0.00–0.40)
Total Protein: 6.4 g/dL (ref 6.0–8.5)

## 2021-02-14 ENCOUNTER — Telehealth: Payer: Self-pay | Admitting: Pharmacist

## 2021-02-14 DIAGNOSIS — E782 Mixed hyperlipidemia: Secondary | ICD-10-CM

## 2021-02-14 MED ORDER — PRALUENT 150 MG/ML ~~LOC~~ SOAJ: 1.0000 "pen " | SUBCUTANEOUS | 3 refills | Status: DC

## 2021-02-14 NOTE — Telephone Encounter (Signed)
Spoke with pt regarding lipid panel results. LDL has decreased from 195 to 97 but still remains elevated above goal < 55. She reports adherence to atorvastatin 80mg  daily and Praluent 75mg  Q2W. Would have expected LDL to be around 40 with this combination. Will increase Praluent to 150mg  Q2W and recheck labs in another 2 months.

## 2021-03-05 ENCOUNTER — Other Ambulatory Visit: Payer: Self-pay | Admitting: Interventional Cardiology

## 2021-03-14 ENCOUNTER — Other Ambulatory Visit: Payer: Self-pay

## 2021-03-14 ENCOUNTER — Ambulatory Visit (INDEPENDENT_AMBULATORY_CARE_PROVIDER_SITE_OTHER): Payer: Medicare (Managed Care)

## 2021-03-14 DIAGNOSIS — Z0000 Encounter for general adult medical examination without abnormal findings: Secondary | ICD-10-CM | POA: Diagnosis not present
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
## 2021-03-14 NOTE — Patient Instructions (Signed)
It was wonderful to see you today.  Please bring ALL of your medications with you to every visit.   Today we talked about:  -Your hemoglobin A1c was 9.1 today. The goal is under 7. -Continue on 1 mg of Ozempic. If you have continued nausea and vomiting, please let me know. We may need to back down on the dose. -It is very important that you continue taking your other medications! Your blood pressure was elevated today.  -You are due for an eye exam.  Please schedule this at your own convenience.   Thank you for choosing [facility].   Please call [phone] with any questions about today's appointment.  Please be sure to schedule follow up at the front  desk before you leave today.   Sharion Settler, DO PGY-2 Family Medicine

## 2021-03-15 ENCOUNTER — Other Ambulatory Visit: Payer: Self-pay

## 2021-03-15 ENCOUNTER — Ambulatory Visit (INDEPENDENT_AMBULATORY_CARE_PROVIDER_SITE_OTHER): Payer: Medicare (Managed Care)

## 2021-03-15 ENCOUNTER — Ambulatory Visit (INDEPENDENT_AMBULATORY_CARE_PROVIDER_SITE_OTHER): Payer: Medicare (Managed Care) | Admitting: Family Medicine

## 2021-03-15 ENCOUNTER — Encounter: Payer: Self-pay | Admitting: Family Medicine

## 2021-03-15 VITALS — BP 150/90 | HR 92 | Ht 67.0 in | Wt 201.0 lb

## 2021-03-15 DIAGNOSIS — Z794 Long term (current) use of insulin: Secondary | ICD-10-CM | POA: Diagnosis not present

## 2021-03-15 DIAGNOSIS — E118 Type 2 diabetes mellitus with unspecified complications: Secondary | ICD-10-CM | POA: Diagnosis not present

## 2021-03-15 DIAGNOSIS — I10 Essential (primary) hypertension: Secondary | ICD-10-CM

## 2021-03-15 DIAGNOSIS — E1165 Type 2 diabetes mellitus with hyperglycemia: Secondary | ICD-10-CM

## 2021-03-15 DIAGNOSIS — Z0000 Encounter for general adult medical examination without abnormal findings: Secondary | ICD-10-CM | POA: Insufficient documentation

## 2021-03-15 DIAGNOSIS — Z23 Encounter for immunization: Secondary | ICD-10-CM

## 2021-03-15 DIAGNOSIS — E782 Mixed hyperlipidemia: Secondary | ICD-10-CM

## 2021-03-15 DIAGNOSIS — Z1231 Encounter for screening mammogram for malignant neoplasm of breast: Secondary | ICD-10-CM

## 2021-03-15 LAB — POCT GLYCOSYLATED HEMOGLOBIN (HGB A1C): HbA1c, POC (controlled diabetic range): 9.1 % — AB (ref 0.0–7.0)

## 2021-03-15 NOTE — Assessment & Plan Note (Signed)
Received pneumonia vaccine, and COVID booster.  Due for mammogram. -Ordered mammogram; patient to call and schedule at earliest convenience

## 2021-03-15 NOTE — Assessment & Plan Note (Signed)
Elevated today, in the setting of nonadherence to medications recently.  Has a home BP cuff, encouraged her to take home readings. -No medication changes for now as she is off medication -Patient to take home BPs, bring log with her next time -If persistently elevated, can consider increasing ARB or starting CCB

## 2021-03-15 NOTE — Assessment & Plan Note (Addendum)
A1c today 9.1, increased from previous in October which was 8.3.  Patient is off [name] currently given previous episodes of hypoglycemia.  Suspect that her nausea, vomiting, abdominal pain were related to Ozempic, as she had missed a few doses and then started back on increased dose.  Unfortunately, she has stopped taking all her other medications during this time. -Has follow-up with Dr. Claiborne Billings in February  -Shared decision making to continue on 1 mg of Ozempic as her symptoms have been improving; however, if she continues to have GI side effects, discussed decreasing back to 0.5 mg dosing -Continue metformin 500 mg twice daily -Continue Jardiance 10 mg -Continue statin, ARB -Diabetic foot exam performed today; had diffusely decreased sensation to monofilament testing.  Encouraged frequent foot checks -I previously tried to get patient into see podiatry, however this has proven difficult 2/2 insurance -Due for eye exam; patient to schedule -Follow-up with me in 3 months

## 2021-03-15 NOTE — Assessment & Plan Note (Signed)
On atorvastatin 80 mg.  Continues to have LDL above goal.  Per cardiology notes, they are considering starting inclisiran.  -Repeat Lipid panel due 01/2022 -Continue Atorvastatin 80 mg  -Encouraged lifestyle changes with diet and exercise

## 2021-03-19 ENCOUNTER — Telehealth: Payer: Self-pay

## 2021-03-19 ENCOUNTER — Other Ambulatory Visit: Payer: Self-pay | Admitting: Family Medicine

## 2021-03-19 MED ORDER — OZEMPIC (0.25 OR 0.5 MG/DOSE) 2 MG/1.5ML ~~LOC~~ SOPN: 0.5000 mg | PEN_INJECTOR | SUBCUTANEOUS | 1 refills | Status: DC

## 2021-03-19 NOTE — Telephone Encounter (Signed)
Attempted to call patient- went straight to voicemail. Her voicemail stated her name so I left a message. Sent in prescription for decreased dose of 0.5 mg/week. Suspect her GI symptoms are related to the Ozempic.

## 2021-03-19 NOTE — Telephone Encounter (Signed)
Patient calls nurse line requesting to discuss medication management further for Ozempic.   Patient reports one episode of emesis yesterday after injecting 1 mg Ozempic.   Patient is requesting to decrease back down to 0.5 mg dose.   Please advise.   Talbot Grumbling, RN

## 2021-03-19 NOTE — Progress Notes (Signed)
Reducing Ozempic from 1 mg to 0.5 mg given GI symptoms.

## 2021-03-20 ENCOUNTER — Telehealth: Payer: Self-pay | Admitting: Family Medicine

## 2021-03-20 NOTE — Telephone Encounter (Signed)
Patient dropped off paperwork from recent hospital visit to be put in her chart. She would like the doctor to contact her about any follow up appts needed. Last DOS was 03/15/21. Placed in [employer].

## 2021-03-20 NOTE — Telephone Encounter (Signed)
Reviewed file and placed in PCP's box. Patient can be reached if needed at [phone].  [name], [name]

## 2021-03-21 NOTE — Telephone Encounter (Signed)
Paperwork can all be found in Epic, thus does not need to be scanned into the chart. Attempted to call patient x2 at [phone] number listed (did not ring) and [phone] number listed in chart with no answer. She has an appointment with our Pharmacist in February, I will plan to see her in April for diabetes follow up. Please relay this message if she calls back.

## 2021-03-27 NOTE — Progress Notes (Signed)
I have reviewed this visit and agree with the documentation.   

## 2021-03-27 NOTE — Patient Instructions (Signed)
You spoke to Dorna Bloom, [place] over the phone for your annual wellness visit.  We discussed goals:   Goals      Blood Pressure < 140/90     Exercise 3x per week (30 min per time)     Encouraged walking with the nicer weather 3x per week for 71mns each time.     Find Help in My Community     Timeframe:  Long-Range Goal Priority:  High Start Date:     09/20/2020                        Expected End Date:                        Patient Goals/Self-Care Activities: Over the next 30 days Call [phone] apply for food stamps Follow up with Partnership Ending Homelessness: [employer] [phone] ffordable Housing Management [phone]   Housing Resources: [employer]   [email or web address]-[email or web address]  3[place]   NAlaskaHTherapist, sportsNCHousingSearch.oRadonna Ricker  [phone] [place]  [phone]    [address] [id number]   [place]  [phone]    [address] [place] [id number]   Affordable Housing Management:  [phone] [email or web address]  [address] [place] [place] [address] Partnership Ending Homelessness: CChiropractor3[place]([facility] [phone]  [address]   [facility]: [facility] By appointment only - Call 3Otho(GGustine   [address][phone] [employer]  [address][phone] [place] [phone]    Patient Stated     Wants to get stable on her medications with her chronic problems of HTN, DM, HLD.       We also discussed recommended health maintenance. As discussed, you are due for:  Health Maintenance  Topic Date Due   Zoster Vaccines- Shingrix (1 of 2) Never done   OPHTHALMOLOGY EXAM  10/26/2019   FOOT EXAM  06/27/2020    MAMMOGRAM  09/21/2020   HEMOGLOBIN A1C  09/12/2021   TETANUS/TDAP  02/15/2025   COLONOSCOPY (Pts [phone] coverage will need to be confirmed)  02/25/2025   Pneumonia Vaccine 6526Years old  Completed   INFLUENZA VACCINE  Completed   DEXA SCAN  Completed   COVID-19 Vaccine  Completed   Hepatitis C Screening  Completed   HPV VACCINES  Aged Out   Followup with PCP in 3 months.  Discuss the need for shingles vaccines. Pharmacy apt scheduled for 04/12/2021. Mammogram scheduled for 04/12/2021.  Preventive Care 6585ears and Older, Female Preventive care refers to lifestyle choices and visits with your health care provider that can promote health and wellness. Preventive care visits are also called wellness exams. What can I expect for my preventive care visit? Counseling Your health care provider may ask you questions about your: Medical history, including: Past medical problems. Family medical history. Pregnancy and menstrual history. History of falls. Current health, including: Memory and ability to understand (cognition). Emotional well-being. Home life and relationship well-being. Sexual activity and sexual health. Lifestyle, including: Alcohol, nicotine or tobacco, and drug use. Access to firearms. Diet, exercise, and sleep habits. Work and work enStatisticianSunscreen  use. Safety issues such as seatbelt and bike helmet use. Physical exam Your health care provider will check your: Height and weight. These may be used to calculate your BMI (body mass index). BMI is a measurement that tells if you are at a healthy weight. Waist circumference. This measures the distance around your waistline. This measurement also tells if you are at a healthy weight and may help predict your risk of certain diseases, such as type 2 diabetes and high blood pressure. Heart rate and blood pressure. Body temperature. Skin for abnormal spots. What immunizations do I need? Vaccines are  usually given at various ages, according to a schedule. Your health care provider will recommend vaccines for you based on your age, medical history, and lifestyle or other factors, such as travel or where you work. What tests do I need? Screening Your health care provider may recommend screening tests for certain conditions. This may include: Lipid and cholesterol levels. Hepatitis C test. Hepatitis B test. HIV (human immunodeficiency virus) test. STI (sexually transmitted infection) testing, if you are at risk. Lung cancer screening. Colorectal cancer screening. Diabetes screening. This is done by checking your blood sugar (glucose) after you have not eaten for a while (fasting). Mammogram. Talk with your health care provider about how often you should have regular mammograms. BRCA-related cancer screening. This may be done if you have a family history of breast, ovarian, tubal, or peritoneal cancers. Bone density scan. This is done to screen for osteoporosis. Talk with your health care provider about your test results, treatment options, and if necessary, the need for more tests. Follow these instructions at home: Eating and drinking  Eat a diet that includes fresh fruits and vegetables, whole grains, lean protein, and low-fat dairy products. Limit your intake of foods with high amounts of sugar, saturated fats, and salt. Take vitamin and mineral supplements as recommended by your health care provider. Do not drink alcohol if your health care provider tells you not to drink. If you drink alcohol: Limit how much you have to 0-1 drink a day. Know how much alcohol is in your drink. In the [place]., one drink equals one 12 oz bottle of beer (355 mL), one 5 oz glass of wine (148 mL), or one 1 oz glass of hard liquor (44 mL). Lifestyle Brush your teeth every morning and night with fluoride toothpaste. Floss one time each day. Exercise for at least 30 minutes 5 or more days each week. Do not use  any products that contain nicotine or tobacco. These products include cigarettes, chewing tobacco, and vaping devices, such as e-cigarettes. If you need help quitting, ask your health care provider. Do not use drugs. If you are sexually active, practice safe sex. Use a condom or other form of protection in order to prevent STIs. Take aspirin only as told by your health care provider. Make sure that you understand how much to take and what form to take. Work with your health care provider to find out whether it is safe and beneficial for you to take aspirin daily. Ask your health care provider if you need to take a cholesterol-lowering medicine (statin). Find healthy ways to manage stress, such as: Meditation, yoga, or listening to music. Journaling. Talking to a trusted person. Spending time with friends and family. Minimize exposure to UV radiation to reduce your risk of skin cancer. Safety Always wear your seat belt while driving or riding in a vehicle. Do not drive: If you have been drinking alcohol.  Do not ride with someone who has been drinking. When you are tired or distracted. While texting. If you have been using any mind-altering substances or drugs. Wear a helmet and other protective equipment during sports activities. If you have firearms in your house, make sure you follow all gun safety procedures. What's next? Visit your health care provider once a year for an annual wellness visit. Ask your health care provider how often you should have your eyes and teeth checked. Stay up to date on all vaccines. This information is not intended to replace advice given to you by your health care provider. Make sure you discuss any questions you have with your health care provider. Document Revised: 08/09/2020 Document Reviewed: 08/09/2020 Elsevier Patient Education  [place].   Diet Recommendations for Diabetes   1. Eat at least 3 meals and 1-2 snacks per day. Never go more  than 4-5 hours while awake without eating. Eat breakfast within the first hour of getting up.   2. Limit starchy foods to TWO per meal and ONE per snack. ONE portion of a starchy  food is equal to the following:   - ONE slice of bread (or its equivalent, such as half of a hamburger bun).   - 1/2 cup of a "scoopable" starchy food such as potatoes or rice.   - 15 grams of Total Carbohydrate as shown on food label.  3. Include at every meal: a protein food, a carb food, and vegetables and/or fruit.   - Obtain twice the volume of vegetables as protein or carbohydrate foods for both lunch and dinner.   - Fresh or frozen vegetables are best.   - Keep frozen vegetables on hand for a quick vegetable serving.       Starchy (carb) foods: Bread, rice, pasta, potatoes, corn, cereal, grits, crackers, bagels, muffins, all baked goods.  (Fruits, milk, and yogurt also have carbohydrate, but most of these foods will not spike your blood sugar as most starchy foods will.)  A few fruits do cause high blood sugars; use small portions of bananas (limit to 1/2 at a time), grapes, watermelon, oranges, and most tropical fruits.    Protein foods: Meat, fish, poultry, eggs, dairy foods, and beans such as pinto and kidney beans (beans also provide carbohydrate).    Hypertension, Adult High blood pressure (hypertension) is when the force of blood pumping through the arteries is too strong. The arteries are the blood vessels that carry blood from the heart throughout the body. Hypertension forces the heart to work harder to pump blood and may cause arteries to become narrow or stiff. Untreated or uncontrolled hypertension can cause a heart attack, heart failure, a stroke, kidney disease, and other problems. A blood pressure reading consists of a higher number over a lower number. Ideally, your blood pressure should be below 120/80. The first ("top") number is called the systolic pressure. It is a measure of the pressure in your  arteries as your heart beats. The second ("bottom") number is called the diastolic pressure. It is a measure of the pressure in your arteries as the heart relaxes. What are the causes? The exact cause of this condition is not known. There are some conditions that result in or are related to high blood pressure. What increases the risk? Some risk factors for high blood pressure are under your control. The following factors may make you more likely to develop this condition: Smoking. Having type 2 diabetes mellitus, high cholesterol, or both.  Not getting enough exercise or physical activity. Being overweight. Having too much fat, sugar, calories, or salt (sodium) in your diet. Drinking too much alcohol. Some risk factors for high blood pressure may be difficult or impossible to change. Some of these factors include: Having chronic kidney disease. Having a family history of high blood pressure. Age. Risk increases with age. Race. You may be at higher risk if you are African American. Gender. Men are at higher risk than women before age 94. After age 28, women are at higher risk than men. Having obstructive sleep apnea. Stress. What are the signs or symptoms? High blood pressure may not cause symptoms. Very high blood pressure (hypertensive crisis) may cause: Headache. Anxiety. Shortness of breath. Nosebleed. Nausea and vomiting. Vision changes. Severe chest pain. Seizures. How is this diagnosed? This condition is diagnosed by measuring your blood pressure while you are seated, with your arm resting on a flat surface, your legs uncrossed, and your feet flat on the floor. The cuff of the blood pressure monitor will be placed directly against the skin of your upper arm at the level of your heart. It should be measured at least twice using the same arm. Certain conditions can cause a difference in blood pressure between your right and left arms. Certain factors can cause blood pressure  readings to be lower or higher than normal for a short period of time: When your blood pressure is higher when you are in a health care provider's office than when you are at home, this is called white coat hypertension. Most people with this condition do not need medicines. When your blood pressure is higher at home than when you are in a health care provider's office, this is called masked hypertension. Most people with this condition may need medicines to control blood pressure. If you have a high blood pressure reading during one visit or you have normal blood pressure with other risk factors, you may be asked to: Return on a different day to have your blood pressure checked again. Monitor your blood pressure at home for 1 week or longer. If you are diagnosed with hypertension, you may have other blood or imaging tests to help your health care provider understand your overall risk for other conditions. How is this treated? This condition is treated by making healthy lifestyle changes, such as eating healthy foods, exercising more, and reducing your alcohol intake. Your health care provider may prescribe medicine if lifestyle changes are not enough to get your blood pressure under control, and if: Your systolic blood pressure is above 130. Your diastolic blood pressure is above 80. Your personal target blood pressure may vary depending on your medical conditions, your age, and other factors. Follow these instructions at home: Eating and drinking  Eat a diet that is high in fiber and potassium, and low in sodium, added sugar, and fat. An example eating plan is called the DASH (Dietary Approaches to Stop Hypertension) diet. To eat this way: Eat plenty of fresh fruits and vegetables. Try to fill one half of your plate at each meal with fruits and vegetables. Eat whole grains, such as whole-wheat pasta, brown rice, or whole-grain bread. Fill about one fourth of your plate with whole grains. Eat or  drink low-fat dairy products, such as skim milk or low-fat yogurt. Avoid fatty cuts of meat, processed or cured meats, and poultry with skin. Fill about one fourth of your plate with lean proteins, such as fish, chicken without skin, beans, eggs,  or tofu. Avoid pre-made and processed foods. These tend to be higher in sodium, added sugar, and fat. Reduce your daily sodium intake. Most people with hypertension should eat less than 1,500 mg of sodium a day. Do not drink alcohol if: Your health care provider tells you not to drink. You are pregnant, may be pregnant, or are planning to become pregnant. If you drink alcohol: Limit how much you use to: 0-1 drink a day for women. 0-2 drinks a day for men. Be aware of how much alcohol is in your drink. In the [place].S., one drink equals one 12 oz bottle of beer (355 mL), one 5 oz glass of wine (148 mL), or one 1 oz glass of hard liquor (44 mL). Lifestyle  Work with your health care provider to maintain a healthy body weight or to lose weight. Ask what an ideal weight is for you. Get at least 30 minutes of exercise most days of the week. Activities may include walking, swimming, or biking. Include exercise to strengthen your muscles (resistance exercise), such as Pilates or lifting weights, as part of your weekly exercise routine. Try to do these types of exercises for 30 minutes at least 3 days a week. Do not use any products that contain nicotine or tobacco, such as cigarettes, e-cigarettes, and chewing tobacco. If you need help quitting, ask your health care provider. Monitor your blood pressure at home as told by your health care provider. Keep all follow-up visits as told by your health care provider. This is important. Medicines Take over-the-counter and prescription medicines only as told by your health care provider. Follow directions carefully. Blood pressure medicines must be taken as prescribed. Do not skip doses of blood pressure medicine. Doing  this puts you at risk for problems and can make the medicine less effective. Ask your health care provider about side effects or reactions to medicines that you should watch for. Contact a health care provider if you: Think you are having a reaction to a medicine you are taking. Have headaches that keep coming back (recurring). Feel dizzy. Have swelling in your ankles. Have trouble with your vision. Get help right away if you: Develop a severe headache or confusion. Have unusual weakness or numbness. Feel faint. Have severe pain in your chest or abdomen. Vomit repeatedly. Have trouble breathing. Summary Hypertension is when the force of blood pumping through your arteries is too strong. If this condition is not controlled, it may put you at risk for serious complications. Your personal target blood pressure may vary depending on your medical conditions, your age, and other factors. For most people, a normal blood pressure is less than 120/80. Hypertension is treated with lifestyle changes, medicines, or a combination of both. Lifestyle changes include losing weight, eating a healthy, low-sodium diet, exercising more, and limiting alcohol. This information is not intended to replace advice given to you by your health care provider. Make sure you discuss any questions you have with your health care provider. Document Revised: 10/22/2017 Document Reviewed: 10/22/2017 Elsevier Patient Education  2022 [facility]'s number is [phone]. Please call with questions or concerns about what we discussed today.

## 2021-03-29 ENCOUNTER — Ambulatory Visit (HOSPITAL_COMMUNITY)
Admission: EM | Admit: 2021-03-29 | Discharge: 2021-03-29 | Disposition: A | Discharge status: Home / Self Care | Payer: Medicare (Managed Care) | Attending: Family Medicine | Admitting: Family Medicine

## 2021-03-29 ENCOUNTER — Other Ambulatory Visit: Payer: Self-pay

## 2021-03-29 ENCOUNTER — Encounter (HOSPITAL_COMMUNITY): Payer: Self-pay

## 2021-03-29 DIAGNOSIS — N898 Other specified noninflammatory disorders of vagina: Secondary | ICD-10-CM | POA: Diagnosis not present

## 2021-03-29 DIAGNOSIS — B3731 Acute candidiasis of vulva and vagina: Secondary | ICD-10-CM | POA: Diagnosis not present

## 2021-03-29 LAB — POCT URINALYSIS DIPSTICK, ED / UC
Bilirubin Urine: NEGATIVE
Glucose, UA: 500 mg/dL — AB
Ketones, ur: NEGATIVE mg/dL
Leukocytes,Ua: NEGATIVE
Nitrite: NEGATIVE
Protein, ur: >=300 mg/dL — AB
Specific Gravity, Urine: 1.015 (ref 1.005–1.030)
Urobilinogen, UA: 0.2 mg/dL (ref 0.0–1.0)
pH: 5 (ref 5.0–8.0)

## 2021-03-29 MED ORDER — FLUCONAZOLE 150 MG PO TABS: ORAL_TABLET | ORAL | 0 refills | Status: DC

## 2021-03-29 NOTE — ED Triage Notes (Signed)
Pt c/o severe vaginal burning and small discharge for almost a month. States was treated in the ED but never completely cleared up.

## 2021-03-29 NOTE — ED Provider Notes (Signed)
[place]   [id number] 03/29/21 Arrival Time: 8144  ASSESSMENT & PLAN:  1. Vaginal itching   2. Yeast vaginitis    Has finished antibiotic for tx of UTI. Will treat for vaginal yeast infection.  Meds ordered this encounter  Medications   fluconazole (DIFLUCAN) 150 MG tablet    Sig: Take one tablet by mouth as a single dose. May repeat in 3 days if symptoms persist.    Dispense:  2 tablet    Refill:  0    Labs Reviewed  POCT URINALYSIS DIPSTICK, ED / UC - Abnormal; Notable for the following components:      Result Value   Glucose, UA 500 (*)    Hgb urine dipstick SMALL (*)    Protein, ur >=300 (*)    All other components within normal limits    Reviewed expectations re: course of current medical issues. Questions answered. Outlined signs and symptoms indicating need for more acute intervention. Patient verbalized understanding. After Visit Summary given.   SUBJECTIVE:  Heather [name] is a 71 y.o. female who presents with complaint of vaginal burning/itching. Sev days after taking Cipro for UTI. H/O DM with hyperglycemia. No specific aggravating or alleviating factors reported. Denies: urinary frequency, dysuria, and gross hematuria. Afebrile. No abdominal or pelvic pain. Normal PO intake wihout n/v. OTC treatment: none.  No LMP recorded. Patient is postmenopausal.   OBJECTIVE:  Vitals:   03/29/21 1328  BP: (!) 182/84  Pulse: 86  Resp: 18  Temp: 98.6 F (37 C)  SpO2: 100%     General appearance: alert, cooperative, appears stated age and no distress Lungs: unlabored respirations; speaks full sentences without difficulty Back: no CVA tenderness; FROM at waist Abdomen: soft, non-tender GU: deferred Skin: warm and dry Psychological: alert and cooperative; normal mood and affect.  Results for orders placed or performed during the hospital encounter of 03/29/21  POC Urinalysis dipstick  Result Value Ref Range   Glucose, UA 500 (A) NEGATIVE mg/dL    Bilirubin Urine NEGATIVE NEGATIVE   Ketones, ur NEGATIVE NEGATIVE mg/dL   Specific Gravity, Urine 1.015 1.005 - 1.030   Hgb urine dipstick SMALL (A) NEGATIVE   pH 5.0 5.0 - 8.0   Protein, ur >=300 (A) NEGATIVE mg/dL   Urobilinogen, UA 0.2 0.0 - 1.0 mg/dL   Nitrite NEGATIVE NEGATIVE   Leukocytes,Ua NEGATIVE NEGATIVE    Labs Reviewed  POCT URINALYSIS DIPSTICK, ED / UC - Abnormal; Notable for the following components:      Result Value   Glucose, UA 500 (*)    Hgb urine dipstick SMALL (*)    Protein, ur >=300 (*)    All other components within normal limits    No Known Allergies  Past Medical History:  Diagnosis Date   Abscess, gluteal, right 05/25/2016   Diabetes mellitus    Encounter for hepatitis C screening test for low risk patient 04/13/2018   Screening 04/13/2018   Food insecurity 04/13/2018   Hyperlipidemia    Hypertension    Medicare annual wellness visit, initial 04/13/2018   Patient is able to have AWV initial   Neuropathy    Other chest pain 01/01/2016   Screening for breast cancer 04/13/2018   Sepsis ([place]) 05/25/2016   Tibial plateau fracture, left 02/16/2015   Uncontrolled diabetes mellitus with diabetic neuropathy, with long-term current use of insulin 01/01/2016   Family History  Problem Relation Age of Onset   Stroke Mother    Alcohol abuse Mother  CAD Maternal Grandmother    Alcohol abuse Father    Diabetes Father    Social History   Socioeconomic History   Marital status: Single    Spouse name: Not on file   Number of children: 0   Years of education: 14   Highest education level: Associate degree: academic program  Occupational History   Occupation: retired    Comment: works in Ambulance person occasionally  Tobacco Use   Smoking status: Never   Smokeless tobacco: Never  Scientific laboratory technician Use: Never used  Substance and Sexual Activity   Alcohol use: No   Drug use: No   Sexual activity: Not Currently    Birth control/protection:  Post-menopausal  Other Topics Concern   Not on file  Social History Narrative   Patient lives in [place], currently in a "temproary home." Patient is looking for an apartment or house. Patient has connected with [facility] in the past.    The patient has no concerns for food, where she is staying provides meals.    Patient enjoys bowling and spending time with her friends.    The patient is back to working part time at [employer] and is very happy about this.    Social Determinants of Health   Financial Resource Strain: Low Risk    Difficulty of Paying Living Expenses: Not very hard  Food Insecurity: Food Insecurity Present   Worried About [place] in the Last Year: Sometimes true   Ran Out of Food in the Last Year: Sometimes true  Transportation Needs: No Transportation Needs   Lack of Transportation (Medical): No   Lack of Transportation (Non-Medical): No  Physical Activity: Inactive   Days of Exercise per Week: 0 days   Minutes of Exercise per Session: 0 min  Stress: Stress Concern Present   Feeling of Stress : To some extent  Social Connections: Moderately Isolated   Frequency of Communication with Friends and Family: More than three times a week   Frequency of Social Gatherings with Friends and Family: More than three times a week   Attends Religious Services: More than 4 times per year   Active Member of [employer] or Organizations: No   Attends Archivist Meetings: Never   Marital Status: Never married  Human resources officer Violence: Not At Risk   Fear of Current or Ex-Partner: No   Emotionally Abused: No   Physically Abused: No   Sexually Abused: No           Vanessa Kick, MD 03/29/21 1415

## 2021-04-04 ENCOUNTER — Other Ambulatory Visit: Payer: Self-pay | Admitting: Student

## 2021-04-04 DIAGNOSIS — I480 Paroxysmal atrial fibrillation: Secondary | ICD-10-CM

## 2021-04-04 NOTE — Telephone Encounter (Signed)
Eliquis 5mg  refill request received. Patient is 71 years old, weight-91.2kg, Crea-1.33 on 11/30/2020, Diagnosis-Afib, and last seen by Dr. Irish Lack on 12/01/2020. Dose is appropriate based on dosing criteria. Will send in refill to requested pharmacy.

## 2021-04-12 ENCOUNTER — Ambulatory Visit (INDEPENDENT_AMBULATORY_CARE_PROVIDER_SITE_OTHER): Payer: Medicare (Managed Care) | Admitting: Pharmacist

## 2021-04-12 ENCOUNTER — Ambulatory Visit: Payer: Medicare (Managed Care)

## 2021-04-12 ENCOUNTER — Ambulatory Visit
Admission: RE | Admit: 2021-04-12 | Discharge: 2021-04-12 | Disposition: A | Discharge status: Home / Self Care | Payer: Medicare (Managed Care) | Source: Ambulatory Visit | Attending: Family Medicine | Admitting: Family Medicine

## 2021-04-12 ENCOUNTER — Other Ambulatory Visit: Payer: Self-pay | Admitting: Family Medicine

## 2021-04-12 ENCOUNTER — Other Ambulatory Visit (HOSPITAL_COMMUNITY): Payer: Self-pay

## 2021-04-12 ENCOUNTER — Other Ambulatory Visit: Payer: Self-pay

## 2021-04-12 DIAGNOSIS — R1013 Epigastric pain: Secondary | ICD-10-CM

## 2021-04-12 DIAGNOSIS — Z1231 Encounter for screening mammogram for malignant neoplasm of breast: Secondary | ICD-10-CM

## 2021-04-12 DIAGNOSIS — E1165 Type 2 diabetes mellitus with hyperglycemia: Secondary | ICD-10-CM

## 2021-04-12 MED ORDER — OZEMPIC (0.25 OR 0.5 MG/DOSE) 2 MG/1.5ML ~~LOC~~ SOPN
0.5000 mg | PEN_INJECTOR | SUBCUTANEOUS | 1 refills | Status: DC
  Filled 2021-04-12: qty 1.5, 28d supply, fill #0
  Filled 2021-05-07: qty 1.5, 28d supply, fill #1

## 2021-04-12 NOTE — Patient Instructions (Signed)
Miss Varnell it was a pleasure seeing you today.   Please do the following:  Continue taking Ozempic 0.5mg  once weekly as directed today during your appointment. If you have any questions or if you believe something has occurred because of this change, call me or your doctor to let one of [place] know.  Continue checking blood sugars at home. It's really important that you record these and bring these in to your next doctor's appointment.  Continue making the lifestyle changes we've discussed together during our visit. Diet and exercise play a significant role in improving your blood sugars.  Follow-up with Dr. Valentina Lucks in one month   Hypoglycemia or low blood sugar:   Low blood sugar can happen quickly and may become an emergency if not treated right away.   While this shouldn't happen often, it can be brought upon if you skip a meal or do not eat enough. Also, if your insulin or other diabetes medications are dosed too high, this can cause your blood sugar to go to low.   Warning signs of low blood sugar include: Feeling shaky or dizzy Feeling weak or tired  Excessive hunger Feeling anxious or upset  Sweating even when you aren't exercising  What to do if I experience low blood sugar? Follow the Rule of 15 Check your blood sugar with your meter. If lower than 70, proceed to step 2.  Treat with 15 grams of fast acting carbs which is found in 3-4 glucose tablets. If none are available you can try hard candy, 1 tablespoon of sugar or honey,4 ounces of fruit juice, or 6 ounces of REGULAR soda.  Re-check your sugar in 15 minutes. If it is still below 70, do what you did in step 2 again. If your blood sugar has come back up, go ahead and eat a snack or small meal made up of complex carbs (ex. Whole grains) and protein at this time to avoid recurrence of low blood sugar.

## 2021-04-12 NOTE — Progress Notes (Signed)
Subjective:    Patient ID: Heather [name], female    DOB: 19-Aug-1950, 71 y.o.   MRN: [id number]  HPI Patient is a 71 y.o. female who presents for diabetes management. She is in good spirits and presents without assistance. Patient was referred on 08/30/20 and last seen by Primary Care Provider on 12/12/20. She was last seen in pharmacy clinic on 01/29/21.  Patient reports the pharmacy did not have the Ozempic 0.5mg  once weekly dose in stock. At last visit we prescribed Ozempic 1mg  but patient reported GI symptoms so her dose was decreased back down to 0.5mg  on 03/19/21.  PMH significant for HTN, PAF, CAD, NSTEMI, HLD.   Patient reports diabetes was diagnosed in 1990's.   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 Patient reports adherence with medications-especially since using the [facility] simple dose. Patient states that she has not missed her medications recently  Do you feel that your medications are working for you?  Yes  Have you been experiencing any side effects to the medications prescribed? no  Do you have any problems obtaining medications due to transportation or finances?  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  Patient denies visual changes.  Patient reports self foot exams.  Self-reported fasting blood glucose: 164 this AM but mostly in 120's  Objective:   Labs:   Physical Exam Neurological:     Mental Status: She is alert and oriented to person, place, and time.   Review of Systems  Gastrointestinal:  Negative for nausea and vomiting.    Lab Results  Component Value Date   HGBA1C 9.1 (A) 03/15/2021   HGBA1C 8.3 (A) 12/12/2020   HGBA1C 7.9 (H) 08/15/2020   Lab Results  Component Value Date   MICRALBCREAT 30-300 10/02/2018    Lipid Panel     Component Value Date/Time   CHOL 197 02/12/2021 1120   TRIG 152 (H) 02/12/2021 1120   HDL 74 02/12/2021 1120   CHOLHDL 2.7 02/12/2021 1120   CHOLHDL 3.6 08/15/2020 0335   VLDL 20 08/15/2020 0335   LDLCALC 97 02/12/2021 1120    Clinical Atherosclerotic Cardiovascular Disease (ASCVD): Yes  The ASCVD Risk score (Arnett DK, et al., 2019) failed to calculate for the following reasons:   The patient has a prior MI or stroke diagnosis   Assessment/Plan:   T2DM is mostly controlled based on blood glucose readings provided and patient no longer  having hypoglycemic episodes. Will continue to hold insulin as glucose still controlled without it. Medication adherence appears optimal. Patient not able to obtain Ozempic 0.5mg   at current pharmacy therefore will send elsewhere. Instructed patient to call clinic back if pharmacy does not have it in stock. At future visit can also discuss  titrating up Jardiance to 25mg  once daily. Following instruction patient verbalized understanding of treatment plan.    Continued dose of GLP-1 Ozempic (generic name semaglutide) 0.5 mg SQ once a week Continued metformin 500 mg BID Continued SGLT2 Jardiance 10mg  once daily Extensively discussed pathophysiology of diabetes, dietary effects on blood sugar control, and recommended lifestyle interventions.  Patient will adhere to dietary modifications. Discussed substituting grilled chicken options instead of fried chicken when she gets fast food Counseled on s/sx of and management of hypoglycemia Next A1C anticipated April 2023  Follow-up appointment in four weeks to review sugar readings and titrate Ozempic as tolerated. Written patient instructions provided.  This appointment required 30 minutes of direct patient care.  Thank you for involving pharmacy to assist in providing this patient's care.

## 2021-04-12 NOTE — Assessment & Plan Note (Signed)
T2DM is mostly controlled based on blood glucose readings provided and patient no longer having hypoglycemic episodes. Will continue to hold insulin as glucose still controlled without it. Medication adherence appears optimal. Patient not able to obtain Ozempic 0.5mg   at current pharmacy therefore will send elsewhere. Instructed patient to call clinic back if pharmacy does not have it in stock. At future visit can also discuss titrating up Jardiance to 25mg  once daily. Following instruction patient verbalized understanding of treatment plan.    1. Continued dose of GLP-1 Ozempic (generic name semaglutide) 0.5 mg SQ once a week 2. Continued metformin 500 mg BID 3. Continued SGLT2 Jardiance 10mg  once daily 4. Extensively discussed pathophysiology of diabetes, dietary effects on blood sugar control, and recommended lifestyle interventions.  5. Patient will adhere to dietary modifications. Discussed substituting grilled chicken options instead of fried chicken when she gets fast food 6. Counseled on s/sx of and management of hypoglycemia 7. Next A1C anticipated April 2023  Follow-up appointment in four weeks to review sugar readings and titrate Ozempic as tolerated.

## 2021-04-23 ENCOUNTER — Other Ambulatory Visit: Payer: Medicare (Managed Care) | Admitting: *Deleted

## 2021-04-23 ENCOUNTER — Other Ambulatory Visit: Payer: Self-pay

## 2021-04-23 DIAGNOSIS — E782 Mixed hyperlipidemia: Secondary | ICD-10-CM

## 2021-04-23 LAB — LIPID PANEL
Chol/HDL Ratio: 2.3 ratio (ref 0.0–4.4)
Cholesterol, Total: 133 mg/dL (ref 100–199)
HDL: 58 mg/dL (ref >39)
LDL Chol Calc (NIH): 54 mg/dL (ref 0–99)
Triglycerides: 121 mg/dL (ref 0–149)
VLDL Cholesterol Cal: 21 mg/dL (ref 5–40)

## 2021-05-07 ENCOUNTER — Other Ambulatory Visit (HOSPITAL_COMMUNITY): Payer: Self-pay

## 2021-05-07 ENCOUNTER — Telehealth: Payer: Self-pay

## 2021-05-07 ENCOUNTER — Other Ambulatory Visit: Payer: Self-pay | Admitting: Family Medicine

## 2021-05-07 MED ORDER — OZEMPIC (0.25 OR 0.5 MG/DOSE) 2 MG/1.5ML ~~LOC~~ SOPN: 0.5000 mg | PEN_INJECTOR | SUBCUTANEOUS | 0 refills | Status: DC

## 2021-05-07 MED ORDER — OZEMPIC (0.25 OR 0.5 MG/DOSE) 2 MG/1.5ML ~~LOC~~ SOPN
0.5000 mg | PEN_INJECTOR | SUBCUTANEOUS | 0 refills | Status: DC
  Filled 2021-05-07: qty 4.5, 84d supply, fill #0

## 2021-05-07 NOTE — Telephone Encounter (Signed)
Received phone call from pharmacist regarding prescription for Ozempic. Insurance prefers 90 day supply.  ? ?If appropriate, please send new rx to the [facility] outpatient pharmacy.  ? ?Talbot Grumbling, RN ? ?

## 2021-05-07 NOTE — Telephone Encounter (Signed)
New Rx for 90-day supply sent to [facility] outpatient pharmacy.  ?

## 2021-05-08 ENCOUNTER — Other Ambulatory Visit (HOSPITAL_COMMUNITY): Payer: Self-pay

## 2021-05-10 ENCOUNTER — Other Ambulatory Visit (HOSPITAL_COMMUNITY): Payer: Self-pay

## 2021-05-10 ENCOUNTER — Ambulatory Visit (INDEPENDENT_AMBULATORY_CARE_PROVIDER_SITE_OTHER): Payer: Medicare (Managed Care) | Admitting: Pharmacist

## 2021-05-10 ENCOUNTER — Encounter: Payer: Self-pay | Admitting: Pharmacist

## 2021-05-10 DIAGNOSIS — E1165 Type 2 diabetes mellitus with hyperglycemia: Secondary | ICD-10-CM

## 2021-05-10 DIAGNOSIS — I10 Essential (primary) hypertension: Secondary | ICD-10-CM

## 2021-05-10 MED ORDER — OZEMPIC (0.25 OR 0.5 MG/DOSE) 2 MG/1.5ML ~~LOC~~ SOPN: 0.5000 mg | PEN_INJECTOR | SUBCUTANEOUS | 0 refills | Status: DC

## 2021-05-10 MED ORDER — AMLODIPINE BESYLATE 5 MG PO TABS: 5.0000 mg | ORAL_TABLET | Freq: Every day | ORAL | 3 refills | Status: DC

## 2021-05-10 MED ORDER — EMPAGLIFLOZIN 25 MG PO TABS: 25.0000 mg | ORAL_TABLET | Freq: Every day | ORAL | 3 refills | Status: DC

## 2021-05-10 NOTE — Progress Notes (Signed)
Reviewed: I agree with Dr. Koval's documentation and management. 

## 2021-05-10 NOTE — Assessment & Plan Note (Signed)
Hypertension longstanding currently uncontrolled. Blood pressure goal of <130/80 mmHg. Medication adherence sub-optimal. Blood pressure control is suboptimal due to moderate adherence and dietary habits. ?-Added amlodipine 5 mg daily to current regimen. Instructed pt to start taking this medication immediately.  ?-Continued current dose of olmesartan 20 mg daily and carvedilol 3.125 mg BID. ?

## 2021-05-10 NOTE — Progress Notes (Signed)
? ? ?S:    ? ?Chief Complaint  ?Patient presents with  ? Medication Management  ?  Diabetes  ? ? ?Heather [name] is a 71 y.o. female who presents for diabetes evaluation, education, and management. PMH is significant for type 2 diabetes, paroxysmal atrial fibrillation, neuropathy, hypertension, hyperlipidemia, gastroesophageal reflex disease, depression, coronary artery disease and NSTEMI. Patient was last seen by Primary Care Provider, Dr. Nita Sells, on 03/19/2021. ? ?Today, She arrives in good spirits and presents without assistance.  ? ?Reports chronic pain in her foot that has been there for about 7 years.  She reports that gabapentin did not help.  ? ?Patient reports Diabetes was diagnosed in the 46s.  ? ?Family/Social History: Aunt on mom's side has diabetes, no hx of thyroid cancer, aunt on mom had DM, some distant relatives ? ?Current diabetes medications include: semaglutide, metformin, empagliflozin ?Current hypertension medications include: carvedilol, olmesartan ?Current hyperlipidemia medications include: alirocumab, atorvastatin ? ?Patient states that She is taking her medications as prescribed. Patient reports adherence with medications. Patient states that she misses her medications every now and then because she really struggles with taking pills. She does try to remember them as much as possible and her family helps hold her accountable too. She reports that she really struggled to take her medications in December into January but now she's doing a lot better with taking them.  ? ?Do you feel that your medications are working for you? Yes; she reports that she doesn't notice a difference with or without them ?Have you been experiencing any side effects to the medications prescribed? No ?Do you have any problems obtaining medications due to transportation or finances? No, she does not have any problems paying for medications as it is usually <$40 which is her max for affording them  ?Insurance  coverage: [employer] Medicare Advantage ? ?Patient denies hypoglycemic events. The lowest she's seen it is in the 90s. ? ?Reported home fasting blood sugars: 120s-160s  ?She reports that her blood glucose this morning was 124. ? ?Patient reports nocturia (nighttime urination). Most nights, and early in the morning - 2 times/night - up and down compared to past  ?Patient reports neuropathy (nerve pain). Foot pain  ?Patient reports visual changes. Vision getting worse - getting a procedure on her left eye next Thursday -- replacing the lens (lens is cloudy); doing the same later on her right eye + might need more for her right eye  ?Patient reports self foot exams. Looks at feet every day  ? ?Patient reported dietary habits: Eats 0-2 meals/day - often forgets to eat meals but might have small snacks throughout the day when she works  ?Breakfast: skips or banana, bagel with cream cheese; if at work, eggs and sausage  ?Lunch: if home, skips; if at work, whatever they have there plus a small salad - yesterday was chicken wings and chopped BBQ and a salad ?Dinner: her cousin cooks so there is always dinner and there is a large variety of types of food; seafood boil last night with corn, potatoes, crab legs ?Snacks: potato chips, crackers; hardly has sweets because she doesn't like them anymore ?Drinks: a lot of water, soda every now and then - her favorite is [employer]  ? ?Has family that gets food stamps and they all buy food together but can be an issue sometimes  ?Stays with family and friends but considers herself homeless currently  ? ?Patient-reported exercise habits: on her feet a lot when at work,  work requires a lot of walking  ? ? ?O:  ?Physical Exam ?Constitutional:   ?   Appearance: Normal appearance.  ?Pulmonary:  ?   Effort: Pulmonary effort is normal.  ?Neurological:  ?   Mental Status: She is alert.  ?Psychiatric:     ?   Mood and Affect: Mood normal.     ?   Behavior: Behavior normal.     ?   Thought  Content: Thought content normal.     ?   Judgment: Judgment normal.  ? ? ?ROS ?Positive for foot pain. Pt describes it as a numbness and not throbbing. Pt reports this has been occurring for ~ 6 years. ? ? ?Lab Results  ?Component Value Date  ? HGBA1C 9.1 (A) 03/15/2021  ? ?There were no vitals filed for this visit. ? ?Lipid Panel  ?   ?Component Value Date/Time  ? CHOL 133 04/23/2021 0856  ? TRIG 121 04/23/2021 0856  ? HDL 58 04/23/2021 0856  ? CHOLHDL 2.3 04/23/2021 0856  ? CHOLHDL 3.6 08/15/2020 0335  ? VLDL 20 08/15/2020 0335  ? [place] 54 04/23/2021 0856  ? ? ?Clinical Atherosclerotic Cardiovascular Disease (ASCVD): Yes  ? ? ?A/P: ?Diabetes longstanding currently uncontrolled. Patient is  able to verbalize appropriate hypoglycemia management plan. Medication adherence appears sub-optimal. Control is suboptimal due to moderate adherence and inconsistent diet. ?-Continued GLP-1 Ozempic (generic name semaglutide) at 0.5 mg weekly.  ?-Increased dose of SGLT2-I Jardiance (generic name empagliflozin) from 10 to 25 mg daily. [facility] Told pt that it would be okay to start this dose increase with next pill pack in April. ?-Patient educated on purpose, proper use, and potential adverse effects of GI effects.  ?-Extensively discussed pathophysiology of diabetes, recommended lifestyle interventions, dietary effects on blood sugar control.  ?-Counseled on s/sx of and management of hypoglycemia.  ?-Next A1c anticipated 05/2021.  ? ?Hypertension longstanding currently uncontrolled. Blood pressure goal of <130/80 mmHg. Medication adherence sub-optimal. Blood pressure control is suboptimal due to moderate adherence and dietary habits. ?-Added amlodipine 5 mg daily to current regimen. Instructed pt to start taking this medication immediately.  ?-Continued current dose of olmesartan 20 mg daily and carvedilol 3.125 mg BID. ? ?Written patient instructions provided. Patient verbalized understanding of  treatment plan. Total time in face to face counseling 48 minutes.   ? ?Follow up pharmacist 06/14/21 at 8:30 am. Patient seen with Earvin Hansen PharmD Candidate and Zenaida Deed, PharmD, PGY 1 pharmacy resident.  ?

## 2021-05-10 NOTE — Patient Instructions (Addendum)
Nice to see you today! ? ?Today we discussed increasing your Jardiance (empagliflozin) from 10 mg daily to 25 mg daily. You can start this with your next pill pack.  ? ?We also discussed adding amlodipine 5 mg daily to help with blood pressure. You will start this medication as soon as it is ready at the pharmacy in addition to what you take with your pill pack.  ? ?Next appointment: 06/14/2021 at 8:30 AM ?

## 2021-05-10 NOTE — Assessment & Plan Note (Signed)
Diabetes longstanding currently uncontrolled. Patient is  able to verbalize appropriate hypoglycemia management plan. Medication adherence appears sub-optimal. Control is suboptimal due to moderate adherence and inconsistent diet. ?-Continued GLP-1 Ozempic (generic name semaglutide) at 0.5 mg weekly.  ?-Increased dose of SGLT2-I Jardiance (generic name empagliflozin) from 10 to 25 mg daily. [facility] Told pt that it would be okay to start this dose increase with next pill pack in April. ?-Patient educated on purpose, proper use, and potential adverse effects of GI effects.  ?-Extensively discussed pathophysiology of diabetes, recommended lifestyle interventions, dietary effects on blood sugar control.  ?-Counseled on s/sx of and management of hypoglycemia.  ?-Next A1c anticipated 05/2021.  ? ?

## 2021-05-21 ENCOUNTER — Other Ambulatory Visit: Payer: Self-pay

## 2021-05-21 MED ORDER — OLMESARTAN MEDOXOMIL 20 MG PO TABS: 20.0000 mg | ORAL_TABLET | Freq: Every day | ORAL | 2 refills | Status: DC
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
## 2021-05-23 ENCOUNTER — Ambulatory Visit: Payer: Medicare (Managed Care) | Admitting: Interventional Cardiology

## 2021-05-23 DIAGNOSIS — E782 Mixed hyperlipidemia: Secondary | ICD-10-CM

## 2021-05-23 DIAGNOSIS — I10 Essential (primary) hypertension: Secondary | ICD-10-CM

## 2021-05-23 DIAGNOSIS — I25118 Atherosclerotic heart disease of native coronary artery with other forms of angina pectoris: Secondary | ICD-10-CM

## 2021-05-23 DIAGNOSIS — I252 Old myocardial infarction: Secondary | ICD-10-CM

## 2021-05-23 DIAGNOSIS — I480 Paroxysmal atrial fibrillation: Secondary | ICD-10-CM

## 2021-05-23 DIAGNOSIS — E1159 Type 2 diabetes mellitus with other circulatory complications: Secondary | ICD-10-CM

## 2021-05-23 DIAGNOSIS — N183 Chronic kidney disease, stage 3 unspecified: Secondary | ICD-10-CM

## 2021-05-24 ENCOUNTER — Other Ambulatory Visit: Payer: Self-pay | Admitting: Family Medicine

## 2021-06-01 ENCOUNTER — Other Ambulatory Visit (HOSPITAL_COMMUNITY): Payer: Self-pay

## 2021-06-03 ENCOUNTER — Other Ambulatory Visit: Payer: Self-pay | Admitting: Interventional Cardiology

## 2021-06-14 ENCOUNTER — Encounter: Payer: Self-pay | Admitting: Pharmacist

## 2021-06-14 ENCOUNTER — Ambulatory Visit (INDEPENDENT_AMBULATORY_CARE_PROVIDER_SITE_OTHER): Payer: Medicare (Managed Care) | Admitting: Pharmacist

## 2021-06-14 VITALS — BP 192/91 | HR 81 | Ht 68.0 in | Wt 198.8 lb

## 2021-06-14 DIAGNOSIS — I10 Essential (primary) hypertension: Secondary | ICD-10-CM | POA: Diagnosis not present

## 2021-06-14 DIAGNOSIS — E1165 Type 2 diabetes mellitus with hyperglycemia: Secondary | ICD-10-CM

## 2021-06-14 LAB — POCT GLYCOSYLATED HEMOGLOBIN (HGB A1C): HbA1c, POC (controlled diabetic range): 7.5 % — AB (ref 0.0–7.0)

## 2021-06-14 MED ORDER — OZEMPIC (1 MG/DOSE) 4 MG/3ML ~~LOC~~ SOPN: 1.0000 mg | PEN_INJECTOR | SUBCUTANEOUS | 3 refills | Status: DC

## 2021-06-14 NOTE — Progress Notes (Signed)
Reviewed: I agree with Dr. Koval's documentation and management. 

## 2021-06-14 NOTE — Patient Instructions (Addendum)
It was nice to see you today! ? ?Your goal blood pressure is less than 130/80 mmHg. In clinic, your blood pressure was elevated due to not taking your medications. ? ?Medication Changes: ? ?Continue your current blood pressure medications: make sure you take them when you get home this morning!  ?Bring your blood pressure cuff to your next visit and take your medications before your next visit.  ? ?Increase Ozempic to 0.5 mg plus 2 clicks each week.  ?Continue Jardiance 25 mg daily, metformin 500 mg twice daily ? ?Start taking Miralax daily for constipation. Make sure you are drinking plenty of water, eating fiber, and exercising regularly.  ? ?Keep up the good work with diet and exercise. Aim for a diet full of vegetables, fruit and lean meats (chicken, [place], fish). Try to limit salt intake by eating fresh or frozen vegetables (instead of canned), rinse canned vegetables prior to cooking and do not add any additional salt to meals.  ? ?Your next appointment is on Thursday, May 18th at 9:45am.  ? ? ? ? ?

## 2021-06-14 NOTE — Assessment & Plan Note (Signed)
Hypertension longstanding currently above goal since patient has not taken medications in 2 days. Blood pressure goal of <130/80 mmHg. Medication adherence denied. No s/sx headache, chest pain, blurred vision, swelling.  ?-Instructed patient to take BP medications upon returning home this morning.  ?-Instructed patient to continue current medications with improved adherence.  ?

## 2021-06-14 NOTE — Assessment & Plan Note (Signed)
Diabetes longstanding with A1c today of 7.5 improved from 9.1 three months ago. Patient is able to verbalize appropriate hypoglycemia management plan. Medication adherence appears appropriate. Given N/V with Ozempic 1 mg but with plateaued effect on appetite with 0.5 mg, will slowly increase Ozempic by a couple clicks each week from 0.5 mg.  ?-Using the 1 mg pen, dial Ozempic (semaglutide) pen to 0.5 mg then add 2 clicks each week as tolerated.  ?-Continue Jardiance (empagliflozin) 25 mg daily and metformin 500 mg BID ?-Extensively discussed pathophysiology of diabetes, recommended lifestyle interventions, dietary effects on blood sugar control.  ?  ? ?

## 2021-06-14 NOTE — Progress Notes (Signed)
? ?S:    ? ?Chief Complaint  ?Patient presents with  ? Medication Management  ?  DM/HTN f/u  ? ?Cayleen Benjamin Gillean is a 71 y.o. female who presents for diabetes evaluation, education, and management. PMH is significant for T2DM, HTN. Patient was referred and last seen by Primary Care Provider, Dr. Nita Sells, on 03/15/2021. At last visit with pharmacy clinic on 05/10/21, Jardiance was increased to 25 mg. Amlodipine was also added for BP 158/89.  ? ?Today, patient arrives in good spirits and presents without assistance. Reports she has been focusing on improving her diet and exercise and has noticed some improvement in her BG. She has not taken her BP medications in two days and is not currently checking her BP at home.  ? ?Patient reports problem with going to the bathroom. Reports she can go days with constipation. She reports this has been going on prior to starting Ozempic and for a long time.  ? ?Patient reports she had nausea/diarrhea with Ozempic 1 mg but does not have these symptoms with the 0.5 mg. However, she feels that the 0.5 mg has not been helping in terms of cutting back appetite. She reports wanting to eat more than usual.  ? ?Family/Social History:  ?-Never smoker ?-Works at [employer]  ? ?Current diabetes medications include: Jardiance (empagliflozin) 25 mg daily, Ozempic (semaglutide) 0.5 mg weekly, metformin 500 mg BID ?Current hypertension medications include: carvedilol 3.125 mg BID, olmesartan 20 mg daily, amlodipine 5 mg daily ?Current hyperlipidemia medications include: atorvastatin 80 mg daily, Praluent (alirocumab) 150 mg every 14 days ? ?Patient reports taking all medications as prescribed. Patient denies adherence with medications. Patient reports missing her medications 1-2 times per week, on average.  ? ?Do you feel that your medications are working for you? yes ?Have you been experiencing any side effects to the medications prescribed? no ? ?Patient denies hypoglycemic  events. ? ?Reported home fasting blood sugars: 120s   ? ?Patient reports nocturia (nighttime urination). Reports 2-3 times per night every night. ?Patient reports neuropathy (nerve pain). ?Patient reports visual changes. Patient has recently had left eye cataract surgery and has right eye surgery coming up. She reports the left eye surgery went fairly well but feels like she cannot see as well. ?Patient denies self foot exams.  ? ?Patient reported dietary habits: Eats 2 meals/day ?Breakfast: doesn't eat breakfast usually ?Lunch: chicken, burgers, vegetables ?Dinner: whatever her cousin cooks. Reports dinner and lunch are usually the same size.  ?Snacks: mostly not snacking but if she does she will eat chips ?Drinks: water, very rarely sodas ? ?Patient-reported exercise habits: none outside of work where she is on her feet all day ? ?Patient reports she has a blood pressure cuff at home but has not checked it lately.  ? ?O:  ?Physical Exam ?Vitals reviewed.  ?Pulmonary:  ?   Effort: Pulmonary effort is normal.  ?Musculoskeletal:     ?   General: No swelling.  ?Neurological:  ?   Mental Status: She is alert.  ?Psychiatric:     ?   Mood and Affect: Mood normal.     ?   Behavior: Behavior normal.     ?   Thought Content: Thought content normal.  ? ?Review of Systems  ?Gastrointestinal:  Positive for constipation.  ?All other systems reviewed and are negative. ? ?Lab Results  ?Component Value Date  ? HGBA1C 7.5 (A) 06/14/2021  ? ?Vitals:  ? 06/14/21 0849 06/14/21 0923  ?BP: (!) 203/97 Marland Kitchen)  192/91  ?Pulse: 81   ?SpO2: 97%   ? ?Lipid Panel  ?   ?Component Value Date/Time  ? CHOL 133 04/23/2021 0856  ? TRIG 121 04/23/2021 0856  ? HDL 58 04/23/2021 0856  ? CHOLHDL 2.3 04/23/2021 0856  ? CHOLHDL 3.6 08/15/2020 0335  ? VLDL 20 08/15/2020 0335  ? LDLCALC 54 04/23/2021 0856  ? ?Clinical Atherosclerotic Cardiovascular Disease (ASCVD): No  ?The ASCVD Risk score (Arnett DK, et al., 2019) failed to calculate for the following  reasons: ?  The patient has a prior MI or stroke diagnosis  ? ? ?A/P: ?Diabetes longstanding with A1c today of 7.5 improved from 9.1 three months ago. Patient is able to verbalize appropriate hypoglycemia management plan. Medication adherence appears appropriate. Given N/V with Ozempic 1 mg but with plateaued effect on appetite with 0.5 mg, will slowly increase Ozempic by a couple clicks each week from 0.5 mg.  ?-Using the 1 mg pen, dial Ozempic (semaglutide) pen to 0.5 mg then add 2 clicks each week as tolerated.  ?-Continue Jardiance (empagliflozin) 25 mg daily and metformin 500 mg BID ?-Extensively discussed pathophysiology of diabetes, recommended lifestyle interventions, dietary effects on blood sugar control.  ?-Counseled on s/sx of and management of hypoglycemia.  ?-Next A1c anticipated July 2023.  ? ?Hypertension longstanding currently above goal since patient has not taken medications in 2 days. Blood pressure goal of <130/80 mmHg. Medication adherence denied. No s/sx headache, chest pain, blurred vision, swelling.  ?-Instructed patient to take BP medications upon returning home this morning.  ?-Instructed patient to continue current medications with improved adherence.  ?-Take BP medications before next visit and bring home BP cuff in for validation. If BP is still elevated at next visit despite adherence, will increase amlodipine to 10 mg.  ? ?Constipation: Encouraged adequate water and fiber intake and increasing exercise. May use Miralax 1 packet or scoop daily.  ? ?Written patient instructions provided. Patient verbalized understanding of treatment plan. Total time in face to face counseling 38 minutes.   ? ?Follow up pharmacist clinic visit in 4 weeks. Patient seen with Earvin Hansen PharmD Candidate and Rebbeca Paul, PharmD - PGY2 Pharmacy Resident.  ?

## 2021-06-15 ENCOUNTER — Telehealth: Payer: Self-pay | Admitting: Family Medicine

## 2021-06-15 NOTE — Telephone Encounter (Signed)
[facility] called regarding this patient, she is scheduled to have surgery and there was a medical clearance sent over on 06/06/2021, I did not see it in the doctors box, not sure if she has it with her. I did request for it to be sent again. They are needing this ASAP. ? ?Thanks! ?

## 2021-06-18 ENCOUNTER — Encounter: Payer: Self-pay | Admitting: Family Medicine

## 2021-06-18 NOTE — Telephone Encounter (Signed)
[facility] is calling to check on status of form being completed.  I informed her of information documented below. She said they would still need the surgical clearance form to be documented and faxed back. They have to have paper copy.  ? ?Please advise.  ?

## 2021-06-18 NOTE — Telephone Encounter (Signed)
Spoke to Heather [name] at [facility] and was informed that a letter written giving medical clearance would suffice. ? ?Contacted Dr. Nita Sells who wrote letter in patient's chart. ? ?Printed and faxed letter to [facility] ATTENTION :Heather [name] [phone] ? ?Marland KitchenOzella Almond, CMA ? ? ? ?

## 2021-06-19 ENCOUNTER — Other Ambulatory Visit: Payer: Self-pay

## 2021-06-19 DIAGNOSIS — E1165 Type 2 diabetes mellitus with hyperglycemia: Secondary | ICD-10-CM

## 2021-06-19 MED ORDER — ATORVASTATIN CALCIUM 80 MG PO TABS: 80.0000 mg | ORAL_TABLET | Freq: Every day | ORAL | 2 refills | Status: DC

## 2021-06-19 MED ORDER — EMPAGLIFLOZIN 25 MG PO TABS: 25.0000 mg | ORAL_TABLET | Freq: Every day | ORAL | 3 refills | Status: DC

## 2021-06-19 MED ORDER — METFORMIN HCL ER 500 MG PO TB24: 500.0000 mg | ORAL_TABLET | Freq: Two times a day (BID) | ORAL | 6 refills | Status: DC

## 2021-07-12 ENCOUNTER — Ambulatory Visit (INDEPENDENT_AMBULATORY_CARE_PROVIDER_SITE_OTHER): Payer: Medicare (Managed Care) | Admitting: Pharmacist

## 2021-07-12 DIAGNOSIS — E1165 Type 2 diabetes mellitus with hyperglycemia: Secondary | ICD-10-CM

## 2021-07-12 DIAGNOSIS — I10 Essential (primary) hypertension: Secondary | ICD-10-CM

## 2021-07-12 DIAGNOSIS — G629 Polyneuropathy, unspecified: Secondary | ICD-10-CM | POA: Diagnosis not present

## 2021-07-12 DIAGNOSIS — I480 Paroxysmal atrial fibrillation: Secondary | ICD-10-CM

## 2021-07-12 DIAGNOSIS — F3289 Other specified depressive episodes: Secondary | ICD-10-CM

## 2021-07-12 MED ORDER — CLOPIDOGREL BISULFATE 75 MG PO TABS: ORAL_TABLET | ORAL | 5 refills | Status: DC

## 2021-07-12 MED ORDER — NITROGLYCERIN 0.4 MG SL SUBL: 0.4000 mg | SUBLINGUAL_TABLET | SUBLINGUAL | 2 refills | Status: DC | PRN

## 2021-07-12 MED ORDER — METFORMIN HCL ER 500 MG PO TB24: 500.0000 mg | ORAL_TABLET | Freq: Two times a day (BID) | ORAL | 5 refills | Status: DC

## 2021-07-12 MED ORDER — APIXABAN 5 MG PO TABS: 5.0000 mg | ORAL_TABLET | Freq: Two times a day (BID) | ORAL | 5 refills | Status: DC

## 2021-07-12 MED ORDER — CARVEDILOL 3.125 MG PO TABS: 3.1250 mg | ORAL_TABLET | Freq: Two times a day (BID) | ORAL | 5 refills | Status: DC

## 2021-07-12 MED ORDER — OLMESARTAN MEDOXOMIL 20 MG PO TABS: 20.0000 mg | ORAL_TABLET | Freq: Every day | ORAL | 5 refills | Status: DC

## 2021-07-12 MED ORDER — EMPAGLIFLOZIN 25 MG PO TABS: 25.0000 mg | ORAL_TABLET | Freq: Every day | ORAL | 5 refills | Status: DC

## 2021-07-12 MED ORDER — AMLODIPINE BESYLATE 10 MG PO TABS: 10.0000 mg | ORAL_TABLET | Freq: Every day | ORAL | 5 refills | Status: DC

## 2021-07-12 MED ORDER — ATORVASTATIN CALCIUM 80 MG PO TABS: 80.0000 mg | ORAL_TABLET | Freq: Every day | ORAL | 5 refills | Status: DC

## 2021-07-12 MED ORDER — DICLOFENAC SODIUM 1 % EX GEL: 2.0000 g | Freq: Four times a day (QID) | CUTANEOUS | 1 refills | Status: DC

## 2021-07-12 MED ORDER — DULOXETINE HCL 60 MG PO CPEP
60.0000 mg | ORAL_CAPSULE | Freq: Every day | ORAL | 5 refills | Status: DC
Start: 2021-07-12 — End: 2022-05-09

## 2021-07-12 MED ORDER — PRALUENT 150 MG/ML ~~LOC~~ SOAJ: 1.0000 "pen " | SUBCUTANEOUS | 5 refills | Status: DC

## 2021-07-12 MED ORDER — OZEMPIC (1 MG/DOSE) 4 MG/3ML ~~LOC~~ SOPN: 1.0000 mg | PEN_INJECTOR | SUBCUTANEOUS | 5 refills | Status: DC

## 2021-07-12 NOTE — Assessment & Plan Note (Signed)
Diabetes longstanding currently above goal with last A1c 7.5 (06/14/21) but improved from previous. Patient is able to verbalize appropriate hypoglycemia management plan. Medication adherence needs improvement - multiple missed doses reported. Control is suboptimal due to this. -Continue Jardiance (empagliflozin) 25 mg daily, Ozempic (semaglutide) 1 mg weekly, metformin 500 mg BID. Emphasized importance of adherence.  -Extensively discussed pathophysiology of diabetes, recommended lifestyle interventions, dietary effects on blood sugar control.  -Counseled on s/sx of and management of hypoglycemia.  -Next A1c anticipated July 2023.

## 2021-07-12 NOTE — Assessment & Plan Note (Signed)
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 She requests a medication to help with this. Dr. Nita Sells came to speak with the patient who agreed with recommendation to start duloxetine to help both neuropathic pain and depression. Dr. Nita Sells will follow up with the patient for further management of this.  -Start duloxetine 60 mg daily.
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
## 2021-07-12 NOTE — Assessment & Plan Note (Signed)
Depression: patient reports long history of depressive symptoms with worsening and more frequent episodes recently. Dr. Nita Sells came to speak with the patient and agreed with recommendation to start duloxetine to help both neuropathic pain and depression. Patient was initially uninterested in starting a medication to help her depression but was agreeable since it will also help her pain. Dr. Nita Sells will follow up with the patient for further management of this.  -Start duloxetine 60 mg daily.

## 2021-07-12 NOTE — Progress Notes (Signed)
S:     Chief Complaint  Patient presents with   Medication Management    DM   Heather [name] is a 71 y.o. female who presents for diabetes evaluation, education, and management. PMH is significant for T2DM, HTN, HLD. Patient was referred and last seen by Primary Care Provider, Dr. Nita Sells, on 03/15/21. Last seen by pharmacy clinic on 06/14/21 at which time we planned to slowly increase the dose of Ozempic from 0.5 mg to 1 mg as tolerated due to history of intolerance at the 1 mg dose (N/D) but patient feeling like the 0.5 mg dose was inadequate.   Today, patient arrives in good spirits and presents without assistance. Reports she did not take Ozempic this Sunday. She is unsure how many doses she's taken since last visit a month ago. She took 1 mg to see if she could tolerate it. She did have some nausea but did not vomit or have diarrhea. She forgot to bring her glucometer today. Reports she took her BP medications today. Reports episodes of depression lately - reports feeling like she does not belong in her family. She reports feeling "jolly" at work and at [employer] but depressed at home (she lives with her cousin). She has loss of interest in things that used to interest her, low energy, poor sleep, low appetite. She does not want a medication for depression. Asks if she can do anything for bilateral foot tingling/numbness in her toes that is constant. She wants to take a medication to help this.   Family/Social History:  -Never smoker -Works at [employer]   Current diabetes medications include: Jardiance (empagliflozin) 25 mg daily, Ozempic (semaglutide) 1 mg weekly, metformin 500 mg BID Current hypertension medications include: amlodipine 5 mg daily, olmesartan 20 mg daily, carvedilol 3.125 mg BID Current hyperlipidemia medications include: atorvastatin 80 mg daily, Praluent (alirocumab) 150 mg every 14 days  Patient denies adherence with medications. Patient reports missing her  medications 2-3 times per week, on average. She has also not stayed consistent with taking her injectable medications (Ozempic and Praluent). Reports this is not due to forgetfulness or feeling like her medications don't work but due to not liking taking medications. She states she knows she needs to be better about taking her medications daily. She has been using [facility] who packages her medications into pill packs but she was notified that they no longer offer this service. She would like to continue to fill her medications at a pharmacy that can provide adherence packaging.   Do you feel that your medications are working for you? yes Have you been experiencing any side effects to the medications prescribed? Yes - some mild nausea with Ozempic  Do you have any problems obtaining medications due to transportation or finances? no Insurance coverage: Medicare  Patient denies hypoglycemic events.  Reported home fasting blood sugars: 120s   Patient reports nocturia (nighttime urination). 2-3 Patient reports neuropathy (nerve pain). Patient reports visual changes. She had her right eye cataract surgery recently.  Patient reports self foot exams.   Patient reported dietary habits: Eats 2 meals/day Breakfast: skips Lunch: salads, meat Dinner: meat, vegetable, spoonful of rice Snacks: potato chips, grapes, canned fruits at work Drinks: water, rarely sodas   Patient-reported exercise habits: on feet at work   O:  Physical Exam Vitals reviewed.  Cardiovascular:     Rate and Rhythm: Normal rate.  Pulmonary:     Effort: Pulmonary effort is normal.  Musculoskeletal:  General: No swelling.  Neurological:     Mental Status: She is alert.  Psychiatric:        Mood and Affect: Mood normal.        Behavior: Behavior normal.        Thought Content: Thought content normal.   Review of Systems  All other systems reviewed and are negative.  Lab Results  Component Value Date    HGBA1C 7.5 (A) 06/14/2021   There were no vitals filed for this visit.  Lipid Panel     Component Value Date/Time   CHOL 133 04/23/2021 0856   TRIG 121 04/23/2021 0856   HDL 58 04/23/2021 0856   CHOLHDL 2.3 04/23/2021 0856   CHOLHDL 3.6 08/15/2020 0335   VLDL 20 08/15/2020 0335   LDLCALC 54 04/23/2021 0856   Clinical Atherosclerotic Cardiovascular Disease (ASCVD): Yes  The ASCVD Risk score (Arnett DK, et al., 2019) failed to calculate for the following reasons:   The patient has a prior MI or stroke diagnosis   A/P: Diabetes longstanding currently above goal with last A1c 7.5 (06/14/21) but improved from previous. Patient is able to verbalize appropriate hypoglycemia management plan. Medication adherence needs improvement - multiple missed doses reported. Control is suboptimal due to this. -Continue Jardiance (empagliflozin) 25 mg daily, Ozempic (semaglutide) 1 mg weekly, metformin 500 mg BID. Emphasized importance of adherence.  -Extensively discussed pathophysiology of diabetes, recommended lifestyle interventions, dietary effects on blood sugar control.  -Counseled on s/sx of and management of hypoglycemia.  -Next A1c anticipated July 2023.   Hypertension longstanding currently uncontrolled but improved from last visit since she has taken her medications today. Blood pressure goal of <130/80 mmHg. Medication adherence needs improvement. Blood pressure control is suboptimal due to dose titration needed. -Increase amlodipine to 10 mg daily.  -Continue olmesartan 20 mg daily, carvedilol 3.125 mg BID.   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 She requests a medication to help with this. Dr. Nita Sells came to speak with the patient who agreed with recommendation to start duloxetine to help both neuropathic pain and depression. Dr. Nita Sells will follow up with the patient for further management of this.  -Start duloxetine 60 mg daily.    Depression: patient reports long history of depressive symptoms with worsening and more frequent episodes recently. Dr. Nita Sells came to speak with the patient and agreed with recommendation to start duloxetine to help both neuropathic pain and depression. Patient was initially uninterested in starting a medication to help her depression but was agreeable since it will also help her pain. Dr. Nita Sells will follow up with the patient for further management of this.  -Start duloxetine 60 mg daily.   Due to [facility] no longer offering [facility] (individual pill packaging for adherence), refilled all medications and sent them to [facility] who offer pill packaging.   Written patient instructions provided. Patient verbalized understanding of treatment plan. Total time in face to face counseling 59 minutes.    Follow up PCP clinic visit in 1 month. Patient seen with Rebbeca Paul, PharmD, PGY2 Pharmacy Resident.

## 2021-07-12 NOTE — Assessment & Plan Note (Signed)
Hypertension longstanding currently uncontrolled but improved from last visit since she has taken her medications today. Blood pressure goal of <130/80 mmHg. Medication adherence needs improvement. Blood pressure control is suboptimal due to dose titration needed. -Increase amlodipine to 10 mg daily.  -Continue olmesartan 20 mg daily, carvedilol 3.125 mg BID.

## 2021-07-12 NOTE — Patient Instructions (Addendum)
It was nice to see you today!  Your goal blood sugar is 80-130 before eating and less than 180 after eating.  Medication Changes: Continue your current diabetes medications.   Increase amlodipine to 10 mg daily to help bring your blood pressure to goal.   Start duloxetine (Cymbalta) 60 mg daily to help the pain in your feet. This can also help your depressive symptoms.   It is important to take your medications every day! We sent all of your medications to be made into pill packs at [facility]: [address], [place], [place] [address]. [phone].   Monitor blood sugars at home and keep a log (glucometer or piece of paper) to bring with you to your next visit.  Keep up the good work with diet and exercise. Aim for a diet full of vegetables, fruit and lean meats (chicken, [place], fish). Try to limit salt intake by eating fresh or frozen vegetables (instead of canned), rinse canned vegetables prior to cooking and do not add any additional salt to meals.

## 2021-07-13 ENCOUNTER — Ambulatory Visit: Payer: Medicare (Managed Care) | Admitting: Interventional Cardiology

## 2021-07-13 ENCOUNTER — Encounter: Payer: Self-pay | Admitting: Interventional Cardiology

## 2021-07-13 VITALS — BP 152/92 | HR 74 | Ht 67.0 in | Wt 195.0 lb

## 2021-07-13 DIAGNOSIS — I10 Essential (primary) hypertension: Secondary | ICD-10-CM | POA: Diagnosis not present

## 2021-07-13 DIAGNOSIS — E1159 Type 2 diabetes mellitus with other circulatory complications: Secondary | ICD-10-CM | POA: Diagnosis not present

## 2021-07-13 DIAGNOSIS — I480 Paroxysmal atrial fibrillation: Secondary | ICD-10-CM | POA: Diagnosis not present

## 2021-07-13 DIAGNOSIS — E782 Mixed hyperlipidemia: Secondary | ICD-10-CM

## 2021-07-13 DIAGNOSIS — N183 Chronic kidney disease, stage 3 unspecified: Secondary | ICD-10-CM

## 2021-07-13 DIAGNOSIS — I25118 Atherosclerotic heart disease of native coronary artery with other forms of angina pectoris: Secondary | ICD-10-CM | POA: Diagnosis not present

## 2021-07-13 DIAGNOSIS — I252 Old myocardial infarction: Secondary | ICD-10-CM | POA: Diagnosis not present

## 2021-07-13 DIAGNOSIS — D6869 Other thrombophilia: Secondary | ICD-10-CM

## 2021-07-13 LAB — COMPREHENSIVE METABOLIC PANEL
ALT: 43 IU/L — ABNORMAL HIGH (ref 0–32)
AST: 35 IU/L (ref 0–40)
Albumin/Globulin Ratio: 1.3 (ref 1.2–2.2)
Albumin: 3.9 g/dL (ref 3.8–4.8)
Alkaline Phosphatase: 90 IU/L (ref 44–121)
BUN/Creatinine Ratio: 14 (ref 12–28)
BUN: 16 mg/dL (ref 8–27)
Bilirubin Total: 0.4 mg/dL (ref 0.0–1.2)
CO2: 22 mmol/L (ref 20–29)
Calcium: 9.4 mg/dL (ref 8.7–10.3)
Chloride: 106 mmol/L (ref 96–106)
Creatinine, Ser: 1.16 mg/dL — ABNORMAL HIGH (ref 0.57–1.00)
Globulin, Total: 3 g/dL (ref 1.5–4.5)
Glucose: 147 mg/dL — ABNORMAL HIGH (ref 70–99)
Potassium: 3.8 mmol/L (ref 3.5–5.2)
Sodium: 142 mmol/L (ref 134–144)
Total Protein: 6.9 g/dL (ref 6.0–8.5)
eGFR: 51 mL/min/{1.73_m2} — ABNORMAL LOW (ref >59)

## 2021-07-13 LAB — CBC
Hematocrit: 35.6 % (ref 34.0–46.6)
Hemoglobin: 11.7 g/dL (ref 11.1–15.9)
MCH: 27.3 pg (ref 26.6–33.0)
MCHC: 32.9 g/dL (ref 31.5–35.7)
MCV: 83 fL (ref 79–97)
Platelets: 302 10*3/uL (ref 150–450)
RBC: 4.29 x10E6/uL (ref 3.77–5.28)
RDW: 14.6 % (ref 11.7–15.4)
WBC: 8.4 10*3/uL (ref 3.4–10.8)

## 2021-07-13 NOTE — Patient Instructions (Signed)
Medication Instructions:  Your physician recommends that you continue on your current medications as directed. Please refer to the Current Medication list given to you today.  *If you need a refill on your cardiac medications before your next appointment, please call your pharmacy*   Lab Work: Lab work to be done today--CBC, CMET If you have labs (blood work) drawn today and your tests are completely normal, you will receive your results only by: [place] (if you have MyChart) OR A paper copy in the mail If you have any lab test that is abnormal or we need to change your treatment, we will call you to review the results.   Testing/Procedures: none   Follow-Up: At [facility], you and your health needs are our priority.  As part of our continuing mission to provide you with exceptional heart care, we have created designated Provider Care Teams.  These Care Teams include your primary Cardiologist (physician) and Advanced Practice Providers (APPs -  Physician Assistants and Nurse Practitioners) who all work together to provide you with the care you need, when you need it.  We recommend signing up for the patient portal called "MyChart".  Sign up information is provided on this After Visit Summary.  MyChart is used to connect with patients for Virtual Visits (Telemedicine).  Patients are able to view lab/test results, encounter notes, upcoming appointments, etc.  Non-urgent messages can be sent to your provider as well.   To learn more about what you can do with MyChart, go to [email or web address].    Your next appointment:   January 14, 2022 at 9:00  The format for your next appointment:   In Person  Provider:   Larae Grooms, MD     Other Instructions    Important Information About Sugar

## 2021-07-16 ENCOUNTER — Other Ambulatory Visit: Payer: Self-pay | Admitting: *Deleted

## 2021-07-16 DIAGNOSIS — R7989 Other specified abnormal findings of blood chemistry: Secondary | ICD-10-CM

## 2021-07-16 DIAGNOSIS — E782 Mixed hyperlipidemia: Secondary | ICD-10-CM

## 2021-07-16 NOTE — Progress Notes (Signed)
Liver function tests--10/17/21

## 2021-07-16 NOTE — Progress Notes (Signed)
Reviewed: I agree with Dr. Koval's documentation and management. 

## 2021-08-02 ENCOUNTER — Other Ambulatory Visit: Payer: Self-pay | Admitting: *Deleted

## 2021-08-05 NOTE — Progress Notes (Signed)
    SUBJECTIVE:   CHIEF COMPLAINT / HPI:   Follow up Mood, Neuropathy Heather [name] is a 71 y.o. female who presents to the [facility] clinic today for follow up on Cymbalta initiation on 5/18. She was seen by our pharmacy team at that time and had mentioned feelings of depression along with Neuropathy. She was started on 60 mg. She reports that she just received her pill pack with the new medication and took her first dose on Sunday. She has not yet noticed any changes, though she has only had two doses thus far.   Nausea Reports that she just started back on the '1mg'$  of Ozempic.  States that she took her first dose on Sunday.  She reports feeling some nausea, however, not as much nausea as when she was previously on 1 mg. She does feel like her appetite is curbed.   PERTINENT  PMH / PSH: T2DM, HLD, HTN, housing insecurity   OBJECTIVE:   BP (!) 145/70   Pulse 72   Temp 97.7 F (36.5 C)   Wt 194 lb (88 kg)   SpO2 99%   BMI 30.38 kg/m    General: NAD, pleasant, able to participate in exam Respiratory: Normal respiratory effort Psych: Normal affect and mood     08/07/2021    8:43 AM 03/15/2021    8:33 AM 03/14/2021    3:14 PM  Depression screen PHQ 2/9  Decreased Interest '1 1 1  '$ Down, Depressed, Hopeless '1 2 1  '$ PHQ - 2 Score '2 3 2  '$ Altered sleeping '3 1 1  '$ Tired, decreased energy '2 2 2  '$ Change in appetite '2 2 1  '$ Feeling bad or failure about yourself  2 2 0  Trouble concentrating '2 2 2  '$ Moving slowly or fidgety/restless 1 0 0  Suicidal thoughts 0 0 0  PHQ-9 Score '14 12 8  '$ Difficult doing work/chores Not difficult at all Not difficult at all      ASSESSMENT/PLAN:   Depression PHQ-9 slightly worsened today but she did score lower on feeling "down, depressed, hopeless". No SI. Mood actually seems much improved today, despite scoring. 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 She has not been on Duloxetine long enough to assess for any effects.  Encouraged her to f/u with me next month to check in.   Nausea Without vomiting.  Improved when she was on similar Ozempic dose in the past. Discussed that if this is intolerable to let me know. Encouraged her to drink Glucerna shakes to get some nutrition, as she does not feel hungry most times (also due to Ozempic). Hopefully symptoms will improve as the week progresses and with future doses.   Type 2 diabetes mellitus with hyperglycemia ([place]) Due for f/u with me next month for A1c check. Continue current medications for now.  HTN (hypertension) Slightly elevated today, above goal (130/80). Can consider increasing Olmesartan at next visit if still elevated.      Heather [name], [place]

## 2021-08-07 ENCOUNTER — Ambulatory Visit (INDEPENDENT_AMBULATORY_CARE_PROVIDER_SITE_OTHER): Payer: Medicare (Managed Care) | Admitting: Family Medicine

## 2021-08-07 VITALS — BP 145/70 | HR 72 | Temp 97.7°F | Wt 194.0 lb

## 2021-08-07 DIAGNOSIS — E1165 Type 2 diabetes mellitus with hyperglycemia: Secondary | ICD-10-CM

## 2021-08-07 DIAGNOSIS — R110 Nausea: Secondary | ICD-10-CM

## 2021-08-07 DIAGNOSIS — I10 Essential (primary) hypertension: Secondary | ICD-10-CM

## 2021-08-07 DIAGNOSIS — F3289 Other specified depressive episodes: Secondary | ICD-10-CM | POA: Diagnosis not present

## 2021-08-07 NOTE — Assessment & Plan Note (Signed)
Slightly elevated today, above goal (130/80). Can consider increasing Olmesartan at next visit if still elevated.

## 2021-08-07 NOTE — Assessment & Plan Note (Signed)
Due for f/u with me next month for A1c check. Continue current medications for now.

## 2021-08-07 NOTE — Patient Instructions (Addendum)
It was wonderful to see you today.  Today we talked about:  -Please schedule an appointment with me in July for a diabetes check and to discuss how your new medication (Duloxetine) is working for you! -If the nausea from the [place] is intolerable, please let me know. I would encourage you to try some nutritional shakes (like Glucerna) to get some nutrition and calories. -Enjoy camp!!  Thank you for choosing [facility].   Please call [phone] with any questions about today's appointment.  Please be sure to schedule follow up at the front  desk before you leave today.   Sharion Settler, DO PGY-2 Family Medicine

## 2021-08-07 NOTE — Assessment & Plan Note (Addendum)
PHQ-9 slightly worsened today but she did score lower on feeling "down, depressed, hopeless". No SI. Mood actually seems much improved today, despite scoring. 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 She has not been on Duloxetine long enough to assess for any effects. Encouraged her to f/u with me next month to check in.

## 2021-08-07 NOTE — Assessment & Plan Note (Signed)
Without vomiting.  Improved when she was on similar Ozempic dose in the past. Discussed that if this is intolerable to let me know. Encouraged her to drink Glucerna shakes to get some nutrition, as she does not feel hungry most times (also due to Ozempic). Hopefully symptoms will improve as the week progresses and with future doses.

## 2021-08-09 ENCOUNTER — Other Ambulatory Visit: Payer: Self-pay | Admitting: Family Medicine

## 2021-09-05 NOTE — Progress Notes (Signed)
    SUBJECTIVE:   CHIEF COMPLAINT / HPI:   Follow Up Mood, Neuropathy Previously started on Cymbalta 60 mg. Reports that she stopped taking all of her medications a few weeks ago. Was feeling unwell with her medications. Felt like once she stopped taking them her mood and energy have improved. Also feels like her foot pain is improved.   Diabetes, Type 2 F/U - Last A1c 7.5 in April - Medications: Jardiance 25 mg, Ozempic 1 mg weekly, Metformin 500 mg BID - Compliance: Stopped taking all of her medications for "a few weeks" - Checking BG at home: Has not been checking  - Diet: Appetite is decreased. Has not made much change to diet. 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 Endorses polyuria   HTN Elevated at last visit above goal of 130/80. On Amlodipine 10 mg, Olmesartan 20 mg, coreg 3.125 mg BID. No CP, shortness of breath. Getting eyes checked on Friday. No leg swelling.  PERTINENT  PMH / PSH:  HLD  OBJECTIVE:   BP (!) 196/94   Pulse 72   Wt 199 lb 9.6 oz (90.5 kg)   SpO2 100%   BMI 31.26 kg/m   Vitals:   09/12/21 0848 09/12/21 0904  BP: (!) 193/97 (!) 196/94  Pulse: 72   SpO2: 100%   General: NAD, pleasant, able to participate in exam Respiratory: Normal respiratory effort Foot exam: No deformities, ulcerations, or other skin breakdown on feet bilaterally.  Sensation intact to light touch.  PT and DP pulses 1+  BL. Calloused feet. Onychomycosis b/l toes  Psych: Normal affect and mood  ASSESSMENT/PLAN:   HTN (hypertension) Asymptomatic but BP quite elevated today on 2 readings.  This is in the setting of patient stopping her medications for the last several weeks. -Discussed importance of BP control -Advised her to restart medications -F/u in 2  weeks for BP recheck once she is back on her medications; no adjustments at this time -Pt will take home BP readings and bring log to f/u apt  Type 2 diabetes mellitus with hyperglycemia (HCC) Hgb A1c 7.8 today, stable from April. Given hx of hypoglycemia in combination with advanced age, will set A1c goal <8.  -Recommended that she restart her medications -Continue statin -Foot exam performed today -Eye examination scheduled 7/21 -Urine microalbumin today -Next diabetes check in 3 months  Diabetic neuropathy ([place]) Improved without medications.  -Monitor; can consider restarting cymbalta if returns      Sharion Settler, [place]

## 2021-09-12 ENCOUNTER — Ambulatory Visit (INDEPENDENT_AMBULATORY_CARE_PROVIDER_SITE_OTHER): Payer: Medicare (Managed Care) | Admitting: Family Medicine

## 2021-09-12 ENCOUNTER — Other Ambulatory Visit: Payer: Self-pay

## 2021-09-12 ENCOUNTER — Encounter: Payer: Self-pay | Admitting: Family Medicine

## 2021-09-12 VITALS — BP 196/94 | HR 72 | Wt 199.6 lb

## 2021-09-12 DIAGNOSIS — E1149 Type 2 diabetes mellitus with other diabetic neurological complication: Secondary | ICD-10-CM

## 2021-09-12 DIAGNOSIS — E1165 Type 2 diabetes mellitus with hyperglycemia: Secondary | ICD-10-CM

## 2021-09-12 DIAGNOSIS — I10 Essential (primary) hypertension: Secondary | ICD-10-CM | POA: Diagnosis not present

## 2021-09-12 LAB — POCT GLYCOSYLATED HEMOGLOBIN (HGB A1C): HbA1c, POC (controlled diabetic range): 7.8 % — AB (ref 0.0–7.0)

## 2021-09-12 NOTE — Patient Instructions (Signed)
It was wonderful to see you today.  Please bring ALL of your medications with you to every visit.   Today we talked about:  -Go back on your medications! Your blood pressure was quite elevated today. I would like for you to come back in 2 weeks once you have started taking your blood pressure medications again.  -No changes to your diabetes medications at this time. -Go to your scheduled eye appointment on Friday.   Thank you for choosing [facility].   Please call [phone] with any questions about today's appointment.  Please be sure to schedule follow up at the front  desk before you leave today.   Sharion Settler, DO PGY-3 Family Medicine

## 2021-09-12 NOTE — Assessment & Plan Note (Signed)
Improved without medications.  -Monitor; can consider restarting cymbalta if returns

## 2021-09-12 NOTE — Assessment & Plan Note (Signed)
Asymptomatic but BP quite elevated today on 2 readings.  This is in the setting of patient stopping her medications for the last several weeks. -Discussed importance of BP control -Advised her to restart medications -F/u in 2 weeks for BP recheck once she is back on her medications; no adjustments at this time -Pt will take home BP readings and bring log to f/u apt

## 2021-09-12 NOTE — Assessment & Plan Note (Addendum)
Hgb A1c 7.8 today, stable from April. Given hx of hypoglycemia in combination with advanced age, will set A1c goal <8.  -Recommended that she restart her medications -Continue statin -Foot exam performed today -Eye examination scheduled 7/21 -Urine microalbumin today -Next diabetes check in 3 months

## 2021-09-13 LAB — MICROALBUMIN / CREATININE URINE RATIO
Creatinine, Urine: 97 mg/dL
Microalb/Creat Ratio: 497 mg/g creat — ABNORMAL HIGH (ref 0–29)
Microalbumin, Urine: 482.1 ug/mL

## 2021-09-17 ENCOUNTER — Encounter: Payer: Self-pay | Admitting: Family Medicine

## 2021-10-05 NOTE — Progress Notes (Deleted)
    SUBJECTIVE:   CHIEF COMPLAINT / HPI:   Heather [name] is a 71 y.o. female who presents to the [facility] clinic today to discuss the following concerns:  Hypertension She was last seen on 7/19 and admitted to stopping all of her home medications. Her BP in the office was elevated on two readings (193/97 and 196/94). She was asymptomatic but was advised to restart medications and follow up. Since then, patient reports good*** compliance to medications. She has been taking her home blood pressures which range *** systolic and *** diastolic. She denies headaches, shortness of breath, vision changes, lower extremity edema and chest pain.***   T2DM Last A1c in July was 7.8. She took herself off all of her medications for at least a few weeks at last visit. She was instructed to restart her Metformin 500 mg BID, Ozempic 1 mg weekly and Jardiance 25 mg weekly. Her urine microalbumin collected at last visit was elevated to 482.1. Home blood sugars range ***. Denies polydipsia, polyuria, symptoms of hypoglycemia.***    PERTINENT  PMH / PSH: HTN, CAD, pAF, T2DM with neuropathy   OBJECTIVE:   There were no vitals taken for this visit.   General: NAD, pleasant, able to participate in exam Cardiac: RRR, no murmurs. Respiratory: CTAB, normal effort, No wheezes, rales or rhonchi Abdomen: Bowel sounds present, nontender, nondistended, no hepatosplenomegaly. Extremities: no edema or cyanosis. Skin: warm and dry, no rashes noted Neuro: alert, no obvious focal deficits Psych: Normal affect and mood  ASSESSMENT/PLAN:   No problem-specific Assessment & Plan notes found for this encounter.     Sharion Settler, [place]

## 2021-10-08 ENCOUNTER — Ambulatory Visit: Payer: Medicare (Managed Care) | Admitting: Family Medicine

## 2021-10-17 ENCOUNTER — Other Ambulatory Visit: Payer: Medicare (Managed Care)

## 2021-10-17 DIAGNOSIS — E782 Mixed hyperlipidemia: Secondary | ICD-10-CM

## 2021-10-17 DIAGNOSIS — R7989 Other specified abnormal findings of blood chemistry: Secondary | ICD-10-CM

## 2021-10-17 LAB — HEPATIC FUNCTION PANEL
ALT: 31 IU/L (ref 0–32)
AST: 20 IU/L (ref 0–40)
Albumin: 3.9 g/dL (ref 3.8–4.8)
Alkaline Phosphatase: 89 IU/L (ref 44–121)
Bilirubin Total: 0.2 mg/dL (ref 0.0–1.2)
Bilirubin, Direct: <0.1 mg/dL (ref 0.00–0.40)
Total Protein: 6.7 g/dL (ref 6.0–8.5)

## 2021-11-14 DIAGNOSIS — E113513 Type 2 diabetes mellitus with proliferative diabetic retinopathy with macular edema, bilateral: Secondary | ICD-10-CM | POA: Diagnosis not present

## 2021-11-14 DIAGNOSIS — H43822 Vitreomacular adhesion, left eye: Secondary | ICD-10-CM | POA: Diagnosis not present

## 2021-11-14 DIAGNOSIS — H31093 Other chorioretinal scars, bilateral: Secondary | ICD-10-CM | POA: Diagnosis not present

## 2021-12-12 DIAGNOSIS — E113513 Type 2 diabetes mellitus with proliferative diabetic retinopathy with macular edema, bilateral: Secondary | ICD-10-CM | POA: Diagnosis not present

## 2021-12-12 DIAGNOSIS — H43822 Vitreomacular adhesion, left eye: Secondary | ICD-10-CM | POA: Diagnosis not present

## 2021-12-12 DIAGNOSIS — H31093 Other chorioretinal scars, bilateral: Secondary | ICD-10-CM | POA: Diagnosis not present

## 2022-01-10 DIAGNOSIS — E113513 Type 2 diabetes mellitus with proliferative diabetic retinopathy with macular edema, bilateral: Secondary | ICD-10-CM | POA: Diagnosis not present
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
## 2022-01-14 ENCOUNTER — Encounter: Payer: Self-pay | Admitting: Interventional Cardiology

## 2022-01-14 ENCOUNTER — Ambulatory Visit: Payer: Medicare HMO | Attending: Interventional Cardiology | Admitting: Interventional Cardiology

## 2022-01-14 VITALS — BP 170/82 | HR 60 | Ht 67.0 in | Wt 203.0 lb

## 2022-01-14 DIAGNOSIS — N183 Chronic kidney disease, stage 3 unspecified: Secondary | ICD-10-CM | POA: Diagnosis not present

## 2022-01-14 DIAGNOSIS — I252 Old myocardial infarction: Secondary | ICD-10-CM

## 2022-01-14 DIAGNOSIS — D6869 Other thrombophilia: Secondary | ICD-10-CM

## 2022-01-14 DIAGNOSIS — I10 Essential (primary) hypertension: Secondary | ICD-10-CM

## 2022-01-14 DIAGNOSIS — I480 Paroxysmal atrial fibrillation: Secondary | ICD-10-CM | POA: Diagnosis not present

## 2022-01-14 DIAGNOSIS — E1159 Type 2 diabetes mellitus with other circulatory complications: Secondary | ICD-10-CM

## 2022-01-14 DIAGNOSIS — I25118 Atherosclerotic heart disease of native coronary artery with other forms of angina pectoris: Secondary | ICD-10-CM

## 2022-01-14 MED ORDER — METFORMIN HCL ER 500 MG PO TB24: 500.0000 mg | ORAL_TABLET | Freq: Two times a day (BID) | ORAL | 5 refills | Status: DC

## 2022-01-14 MED ORDER — APIXABAN 5 MG PO TABS: 5.0000 mg | ORAL_TABLET | Freq: Two times a day (BID) | ORAL | 5 refills | Status: DC

## 2022-01-14 MED ORDER — OLMESARTAN MEDOXOMIL 20 MG PO TABS: 20.0000 mg | ORAL_TABLET | Freq: Every day | ORAL | 5 refills | Status: DC

## 2022-01-14 NOTE — Patient Instructions (Signed)
Medication Instructions:  Your physician has recommended you make the following change in your medication: Resume Olmesartan, Metformin and Eliquis  *If you need a refill on your cardiac medications before your next appointment, please call your pharmacy*   Lab Work: Your physician recommends that you return for lab work [place] January 22, 2022 when you see the pharmacist  If you have labs (blood work) drawn today and your tests are completely normal, you will receive your results only by: [place] (if you have MyChart) OR A paper copy in the mail If you have any lab test that is abnormal or we need to change your treatment, we will call you to review the results.   Testing/Procedures: none   Follow-Up: At [facility], you and your health needs are our priority.  As part of our continuing mission to provide you with exceptional heart care, we have created designated Provider Care Teams.  These Care Teams include your primary Cardiologist (physician) and Advanced Practice Providers (APPs -  Physician Assistants and Nurse Practitioners) who all work together to provide you with the care you need, when you need it.  We recommend signing up for the patient portal called "MyChart".  Sign up information is provided on this After Visit Summary.  MyChart is used to connect with patients for Virtual Visits (Telemedicine).  Patients are able to view lab/test results, encounter notes, upcoming appointments, etc.  Non-urgent messages can be sent to your provider as well.   To learn more about what you can do with MyChart, go to [email or web address].    Your next appointment:   To be determined after seen by pharmacist  The format for your next appointment:   In Person  Provider:   Larae Grooms, MD     Other Instructions  You are scheduled to see the pharmacist on January 22, 2022 at 9:30.  Lab work will also be done at this appointment.  Please bring all your  medications to this appointment Important Information About Sugar

## 2022-01-21 NOTE — Progress Notes (Unsigned)
Patient ID: Heather [name]                 DOB: [birth year]                      MRN: [id number]     HPI: Heather [name] is a 71 y.o. female referred by Dr. Irish Lack to HTN clinic. 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 Previously seen in lipid clinic for LDL of 195. Improved to 54 on atorvastatin 67m daily and Praluent 1575mQ2W. Last seen by Dr VaIrish Lack1/20/23, had stopped taking all meds in September due to fatigue. BP was 170/82 at that visit. She was restarted on Eliquis, olmesartan, and metformin and referred to PharmD for follow up.  Discuss importance of adherence On atorva? Needs to restart praluent (and atorva) Not on amlo or coreg? Add ozempic to intolerances - GI issues Needs to restart SGLT2i and plavix  Current HTN meds: amlodipine 10104maily, carvedilol 3.125m46mD, olmesartan 20mg33mly  BP goal: <130/80mmH13mamily History: Alcohol abuse in her father and mother; CAD in her maternal grandmother; Diabetes in her father; Stroke in her mother.    Social History: The patient  reports that she has never smoked. She has never used smokeless tobacco. She reports that she does not drink alcohol and does not use drugs.    Diet:   Exercise:   Home BP readings:   Wt Readings from Last 3 Encounters:  01/14/22 203 lb (92.1 kg)  09/12/21 199 lb 9.6 oz (90.5 kg)  08/07/21 194 lb (88 kg)   BP Readings from Last 3 Encounters:  01/14/22 (!) 170/82  09/12/21 (!) 196/94  08/07/21 (!) 145/70   Pulse Readings from Last 3 Encounters:  01/14/22 60  09/12/21 72  08/07/21 72    Renal function: CrCl cannot be calculated (Patient's most recent lab result is older than the maximum 21 days allowed.).  Past Medical History:  Diagnosis Date   Abscess, gluteal, right 05/25/2016   Diabetes mellitus    Encounter for hepatitis C screening test for low risk patient 04/13/2018   Screening  04/13/2018   Food insecurity 04/13/2018   Hyperlipidemia    Hypertension    Medicare annual wellness visit, initial 04/13/2018   Patient is able to have AWV initial   Neuropathy    Other chest pain 01/01/2016   Screening for breast cancer 04/13/2018   Sepsis (HCC) 3[place]/2018   Tibial plateau fracture, left 02/16/2015   Uncontrolled diabetes mellitus with diabetic neuropathy, with long-term current use of insulin 01/01/2016    Current Outpatient Medications on File Prior to Visit  Medication Sig Dispense Refill   Alirocumab (PRALUENT) 150 MG/ML SOAJ Inject 1 pen. into the skin every 14 (fourteen) days. (Patient not taking: Reported on 01/14/2022) 2 mL 5   amLODipine (NORVASC) 10 MG tablet Take 1 tablet (10 mg total) by mouth at bedtime. (Patient not taking: Reported on 09/12/2021) 30 tablet 5   apixaban (ELIQUIS) 5 MG TABS tablet Take 1 tablet (5 mg total) by mouth 2 (two) times daily. 60 tablet 5   atorvastatin (LIPITOR) 80 MG tablet Take 1 tablet (80 mg total) by mouth daily. (Patient not taking: Reported on 09/12/2021) 30 tablet 5   blood glucose meter kit and supplies KIT Dispense based on patient and insurance preference. Use up to four times daily as directed. (  Patient not taking: Reported on 09/12/2021) 1 each 0   carvedilol (COREG) 3.125 MG tablet Take 1 tablet (3.125 mg total) by mouth 2 (two) times daily with a meal. (Patient not taking: Reported on 09/12/2021) 60 tablet 5   clopidogrel (PLAVIX) 75 MG tablet TAKE 1 TABLET BY MOUTH EVERY DAY WITH BREAKFAST (Patient not taking: Reported on 09/12/2021) 30 tablet 5   diclofenac Sodium (VOLTAREN) 1 % GEL APPLY 2 TO 4 GRAMS TOPICALLY 4 (FOUR) TIMES DAILY. (Patient not taking: Reported on 09/12/2021) 100 g 1   DULoxetine (CYMBALTA) 60 MG capsule Take 1 capsule (60 mg total) by mouth daily. (Patient not taking: Reported on 09/12/2021) 30 capsule 5   empagliflozin (JARDIANCE) 25 MG TABS tablet Take 1 tablet (25 mg total) by mouth daily. (Patient not  taking: Reported on 09/12/2021) 30 tablet 5   Lancets (ONETOUCH DELICA PLUS [id number]) MISC USE AS DIRECTED to check sugars once daily (Patient not taking: Reported on 09/12/2021) 100 each 3   metFORMIN (GLUCOPHAGE-XR) 500 MG 24 hr tablet Take 1 tablet (500 mg total) by mouth 2 (two) times daily. 60 tablet 5   Needles & Syringes MISC 1 Container by Does not apply route daily. (Patient not taking: Reported on 01/14/2022) 90 each 2   nitroGLYCERIN (NITROSTAT) 0.4 MG SL tablet Place 1 tablet (0.4 mg total) under the tongue every 5 (five) minutes as needed for chest pain. (Patient not taking: Reported on 09/12/2021) 25 tablet 2   olmesartan (BENICAR) 20 MG tablet Take 1 tablet (20 mg total) by mouth daily. 30 tablet 5   prednisoLONE acetate (PRED FORTE) 1 % ophthalmic suspension Place 1 drop into the right eye 4 (four) times daily. (Patient not taking: Reported on 09/12/2021)     Semaglutide, 1 MG/DOSE, (OZEMPIC, 1 MG/DOSE,) 4 MG/3ML SOPN Inject 1 mg into the skin once a week. (Patient not taking: Reported on 09/12/2021) 3 mL 5   No current facility-administered medications on file prior to visit.    No Known Allergies   Assessment/Plan:  1. Hypertension -

## 2022-01-22 ENCOUNTER — Ambulatory Visit: Payer: Medicare HMO

## 2022-01-22 ENCOUNTER — Ambulatory Visit: Payer: Medicare HMO | Attending: Interventional Cardiology | Admitting: Pharmacist

## 2022-01-22 VITALS — BP 182/92 | HR 79

## 2022-01-22 DIAGNOSIS — I25118 Atherosclerotic heart disease of native coronary artery with other forms of angina pectoris: Secondary | ICD-10-CM

## 2022-01-22 DIAGNOSIS — I10 Essential (primary) hypertension: Secondary | ICD-10-CM

## 2022-01-22 DIAGNOSIS — N183 Chronic kidney disease, stage 3 unspecified: Secondary | ICD-10-CM | POA: Diagnosis not present

## 2022-01-22 DIAGNOSIS — E782 Mixed hyperlipidemia: Secondary | ICD-10-CM

## 2022-01-22 DIAGNOSIS — E1165 Type 2 diabetes mellitus with hyperglycemia: Secondary | ICD-10-CM

## 2022-01-22 MED ORDER — AMLODIPINE BESYLATE 10 MG PO TABS: 10.0000 mg | ORAL_TABLET | Freq: Every day | ORAL | 5 refills | Status: DC

## 2022-01-22 MED ORDER — CLOPIDOGREL BISULFATE 75 MG PO TABS: ORAL_TABLET | ORAL | 5 refills | Status: DC

## 2022-01-22 MED ORDER — EMPAGLIFLOZIN 25 MG PO TABS: 25.0000 mg | ORAL_TABLET | Freq: Every day | ORAL | 5 refills | Status: DC

## 2022-01-22 MED ORDER — ROSUVASTATIN CALCIUM 40 MG PO TABS: 40.0000 mg | ORAL_TABLET | Freq: Every day | ORAL | 3 refills | Status: DC

## 2022-01-22 NOTE — Patient Instructions (Addendum)
Blood pressure: start changes today/tomorrow Your blood pressure is above your goal of < 130/80 -Restart your amlodipine. For the first 4 days, cut the tablet in half and take 1/2 tablet daily. Then, increase back to 1 tablet ('10mg'$ ) once daily -Continue taking your olmesartan  Blood sugar: -Restart your Jardiance -Continue metformin and lower dose of Ozempic for now  Restart Plavix (clopidogrel)  Cholesterol: start changes in 1-2 weeks if you tolerate the above changes -Start rosuvastatin (Crestor) '40mg'$  - 1 tablet daily. This will replace your atorvastatin (Lipitor) -Restart Praluent injections every 2 weeks

## 2022-01-23 LAB — BASIC METABOLIC PANEL
BUN/Creatinine Ratio: 13 (ref 12–28)
BUN: 19 mg/dL (ref 8–27)
CO2: 21 mmol/L (ref 20–29)
Calcium: 9.1 mg/dL (ref 8.7–10.3)
Chloride: 105 mmol/L (ref 96–106)
Creatinine, Ser: 1.41 mg/dL — ABNORMAL HIGH (ref 0.57–1.00)
Glucose: 167 mg/dL — ABNORMAL HIGH (ref 70–99)
Potassium: 4 mmol/L (ref 3.5–5.2)
Sodium: 142 mmol/L (ref 134–144)
eGFR: 40 mL/min/{1.73_m2} — ABNORMAL LOW (ref >59)

## 2022-02-11 ENCOUNTER — Ambulatory Visit: Payer: Medicare HMO | Attending: Family Medicine

## 2022-02-11 NOTE — Progress Notes (Incomplete)
Patient ID: Heather [name]                 DOB: 1951-01-01                      MRN: [id number]     HPI: Heather [name] is a 71 y.o. female referred by Dr. Irish [name] to HTN clinic. 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 Previously seen in lipid clinic for LDL of 195. Improved to 54 on atorvastatin 50m daily and Praluent 1566mQ2W. Last seen by Dr Heather Lack1/20/23, had stopped taking all meds in September due to fatigue, "didn't feel like doing anything." Fatigue improved notably when off of her meds. BP was 170/82 at that visit. She was restarted on Eliquis, olmesartan, and metformin and referred to PharmD for follow up.  Patient was seen by PharmD on 11/14/21 for gradual reintroduction of medications. At that visit amlodipine was restarted at 40m43maily x 4 days, then back up to 67m38mily maintenance dose. Jardiance 25 mg daily was restarted. Lipid medications were planned to be restarted after she was known to tolerate other changes, except atorvastatin 80mg80m planned to be transitioned to rosuvastatin 40mg 50my at that time in case atorvastatin had been contributing to her fatigue.   Current HTN meds: olmesartan 20mg d18m, amlodipine 10 mg daily  BP goal: <130/80mmHg 40mily History: Alcohol abuse in her father and mother; CAD in her maternal grandmother; Diabetes in her father; Stroke in her mother.    Social History: The patient  reports that she has never smoked. She has never used smokeless tobacco. She reports that she does not drink alcohol and does not use drugs.    Diet: watches her salt  Exercise: not active lately  Home BP readings: needs new battery for her cuff  Wt Readings from Last 3 Encounters:  01/14/22 203 lb (92.1 kg)  09/12/21 199 lb 9.6 oz (90.5 kg)  08/07/21 194 lb (88 kg)   BP Readings from Last 3 Encounters:  01/22/22 (!) 182/92  01/14/22 (!) 170/82  09/12/21 (!)  196/94   Pulse Readings from Last 3 Encounters:  01/22/22 79  01/14/22 60  09/12/21 72    Renal function: CrCl cannot be calculated (Unknown ideal weight.).  Past Medical History:  Diagnosis Date   Abscess, gluteal, right 05/25/2016   Diabetes mellitus    Encounter for hepatitis C screening test for low risk patient 04/13/2018   Screening 04/13/2018   Food insecurity 04/13/2018   Hyperlipidemia    Hypertension    Medicare annual wellness visit, initial 04/13/2018   Patient is able to have AWV initial   Neuropathy    Other chest pain 01/01/2016   Screening for breast cancer 04/13/2018   Sepsis (HCC) 3/3[place]018   Tibial plateau fracture, left 02/16/2015   Uncontrolled diabetes mellitus with diabetic neuropathy, with long-term current use of insulin 01/01/2016    Current Outpatient Medications on File Prior to Visit  Medication Sig Dispense Refill   Alirocumab (PRALUENT) 150 MG/ML SOAJ Inject 1 pen. into the skin every 14 (fourteen) days. (Patient not taking: Reported on 01/14/2022) 2 mL 5   amLODipine (NORVASC) 10 MG tablet Take 1 tablet (10 mg total) by mouth at bedtime. 30 tablet 5   apixaban (ELIQUIS) 5 MG TABS tablet Take 1 tablet (5 mg total) by mouth 2 (two) times daily.  60 tablet 5   blood glucose meter kit and supplies KIT Dispense based on patient and insurance preference. Use up to four times daily as directed. (Patient not taking: Reported on 09/12/2021) 1 each 0   carvedilol (COREG) 3.125 MG tablet Take 1 tablet (3.125 mg total) by mouth 2 (two) times daily with a meal. (Patient not taking: Reported on 09/12/2021) 60 tablet 5   clopidogrel (PLAVIX) 75 MG tablet TAKE 1 TABLET BY MOUTH EVERY DAY WITH BREAKFAST 30 tablet 5   diclofenac Sodium (VOLTAREN) 1 % GEL APPLY 2 TO 4 GRAMS TOPICALLY 4 (FOUR) TIMES DAILY. (Patient not taking: Reported on 09/12/2021) 100 g 1   DULoxetine (CYMBALTA) 60 MG capsule Take 1 capsule (60 mg total) by mouth daily. (Patient not taking: Reported on  09/12/2021) 30 capsule 5   empagliflozin (JARDIANCE) 25 MG TABS tablet Take 1 tablet (25 mg total) by mouth daily. 30 tablet 5   Lancets (ONETOUCH DELICA PLUS [id number]) MISC USE AS DIRECTED to check sugars once daily (Patient not taking: Reported on 09/12/2021) 100 each 3   metFORMIN (GLUCOPHAGE-XR) 500 MG 24 hr tablet Take 1 tablet (500 mg total) by mouth 2 (two) times daily. 60 tablet 5   Needles & Syringes MISC 1 Container by Does not apply route daily. (Patient not taking: Reported on 01/14/2022) 90 each 2   nitroGLYCERIN (NITROSTAT) 0.4 MG SL tablet Place 1 tablet (0.4 mg total) under the tongue every 5 (five) minutes as needed for chest pain. (Patient not taking: Reported on 09/12/2021) 25 tablet 2   olmesartan (BENICAR) 20 MG tablet Take 1 tablet (20 mg total) by mouth daily. 30 tablet 5   prednisoLONE acetate (PRED FORTE) 1 % ophthalmic suspension Place 1 drop into the right eye 4 (four) times daily. (Patient not taking: Reported on 09/12/2021)     rosuvastatin (CRESTOR) 40 MG tablet Take 1 tablet (40 mg total) by mouth daily. 90 tablet 3   Semaglutide, 1 MG/DOSE, (OZEMPIC, 1 MG/DOSE,) 4 MG/3ML SOPN Inject 1 mg into the skin once a week. (Patient not taking: Reported on 09/12/2021) 3 mL 5   No current facility-administered medications on file prior to visit.    No Known Allergies   Assessment/Plan:  1. Hypertension - BP remains elevated above goal < 130/49mHg. Checking BMET today with resumption of olmesartan 27mdaily at last visit. Will restart amlodipine - 2m13maily x 4 days, then back up to 44m62mily maintenance dose. Pt will look for new batteries and start monitoring her BP again. Plans to increase activity and continue watching her salt.  2. Hyperlipidemia - She has not been taking her statin or Praluent. Baseline LDL is almost 200, previously lipids very well controlled on statin and PCSK9i. If she tolerates above changes with BP meds, will have her restart lipid meds in 1-2 weeks  with Praluent 150mg42m. Will change atorvastatin 80mg 70mosuvastatin 40mg d17m at that time in case atorvastatin had been contributing to her fatigue.  3. DM - She reports tolerating her Jardiance well in the past, she will restart this. She'll continue on metformin and the lower dose of Ozempic 0.2mg wee31m she's been taking, will plan to titrate back up to 1mg at n50m visit.  Will also have her restart Plavix. This should not have contributed to her fatigue. Still remains off her carvedilol and duloxetine, both of which could have contributed to her fatigue. Does report the duloxetine helped with foot pain but may need new med if  this has been causing her fatigue. Will reassess med tolerability at next visit in 3 weeks.

## 2022-02-12 DIAGNOSIS — H31003 Unspecified chorioretinal scars, bilateral: Secondary | ICD-10-CM | POA: Diagnosis not present

## 2022-02-12 DIAGNOSIS — E113513 Type 2 diabetes mellitus with proliferative diabetic retinopathy with macular edema, bilateral: Secondary | ICD-10-CM | POA: Diagnosis not present

## 2022-03-13 DIAGNOSIS — H31003 Unspecified chorioretinal scars, bilateral: Secondary | ICD-10-CM | POA: Diagnosis not present

## 2022-03-13 DIAGNOSIS — E113513 Type 2 diabetes mellitus with proliferative diabetic retinopathy with macular edema, bilateral: Secondary | ICD-10-CM | POA: Diagnosis not present

## 2022-03-13 DIAGNOSIS — E133213 Other specified diabetes mellitus with mild nonproliferative diabetic retinopathy with macular edema, bilateral: Secondary | ICD-10-CM | POA: Diagnosis not present

## 2022-03-20 ENCOUNTER — Encounter: Payer: Self-pay | Admitting: Family Medicine

## 2022-03-20 ENCOUNTER — Ambulatory Visit (INDEPENDENT_AMBULATORY_CARE_PROVIDER_SITE_OTHER): Payer: Medicare HMO | Admitting: Family Medicine

## 2022-03-20 VITALS — BP 160/100 | Ht 67.91 in | Wt 197.8 lb

## 2022-03-20 DIAGNOSIS — Z659 Problem related to unspecified psychosocial circumstances: Secondary | ICD-10-CM

## 2022-03-20 DIAGNOSIS — Z609 Problem related to social environment, unspecified: Secondary | ICD-10-CM

## 2022-03-20 DIAGNOSIS — Z0000 Encounter for general adult medical examination without abnormal findings: Secondary | ICD-10-CM
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
## 2022-03-20 NOTE — Patient Instructions (Signed)
Heather [name],  I have placed an urgent referral to our social workers to see if they can assist you with housing resources. Please see below for additional options. I would urge you to call and try to to get connected.   Patient Goals/Self-Care Activities: Over the next 30 days Call Department of Social Services [phone] to apply for food stamps Follow up with Partnership Ending Homelessness: Coordinated Entry [phone] and ffordable Housing Management [phone]    Housing Resources: [employer]:   [email or web address]  [place],   [place] Therapist, sports.NCHousingSearch.Radonna Ricker   [phone]  [place]:  [phone]     [place], [place], [place] [address]    [place]:  [phone]     [place], [place], [place] [address]    Affordable Housing Management:  [phone]  [email or web address]  [address], [place] [address] [place], [place] [address]  Partnership Ending Homelessness: Chiropractor [place] [facility]) [phone]   [address]    [facility]: [facility] Emergency Shelter By appointment only - Call [place] ([place])   [place] [address] [phone]  [facility]  [address] [place]  [phone]  To find a therapist visit [email or web address]. At this website you will be able to filter which providers take your click your insurance as well as other filters to fit your needs. For medicine management please call your insurance to find psychiatrist in network   If you are feeling suicidal or depression symptoms worsen please immediately go to:   If you are thinking about harming yourself or having thoughts of suicide, or if you know someone who is, seek help right away. If you are in crisis, make sure you are not left alone.  If someone else is in  crisis, make sure he/she/they is not left alone  Call 988 OR 1-800-273-TALK  24 Hour Availability for [place]  [address] [place], [place] Crisis [phone]  Other crisis resources:  Family Service of the [employer] (Domestic Violence, Rape & Victim Assistance [phone]  [facility] [place]    (ONLY from 8am-4pm)    [phone]  [facility] Mobile Crisis Unit (24/7)   [phone]  [place] National Suicide Hotline   [phone] Diamantina Monks)

## 2022-03-21 ENCOUNTER — Telehealth: Payer: Self-pay | Admitting: *Deleted

## 2022-03-21 ENCOUNTER — Ambulatory Visit: Payer: Self-pay | Admitting: *Deleted

## 2022-03-21 DIAGNOSIS — Z59819 Housing instability, housed unspecified: Secondary | ICD-10-CM

## 2022-03-21 NOTE — Progress Notes (Signed)
  Care Coordination   Note   03/21/2022 Name: Heather [name] MRN: [id number] DOB: 1950/06/18  [name] is a 72 y.o. year old female who sees Sharion Settler, DO for primary care. I reached out to [facility] by phone today to offer care coordination services.  [name] was given information about Care Coordination services today including:   The Care Coordination services include support from the care team which includes your Nurse Coordinator, Clinical Social Worker, or Pharmacist.  The Care Coordination team is here to help remove barriers to the health concerns and goals most important to you. Care Coordination services are voluntary, and the patient may decline or stop services at any time by request to their care team member.   Care Coordination Consent Status: Patient agreed to services and verbal consent obtained.   Follow up plan:  Telephone appointment with care coordination team member scheduled for:  03/21/22  Encounter Outcome:  Pt. Scheduled [place]  Direct Dial: [phone]

## 2022-03-22 ENCOUNTER — Telehealth: Payer: Self-pay | Admitting: *Deleted

## 2022-03-22 ENCOUNTER — Ambulatory Visit: Payer: Self-pay | Admitting: *Deleted
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
## 2022-03-22 NOTE — Patient Instructions (Signed)
Visit Information  Thank you for taking time to visit with me today. Please don't hesitate to contact me if I can be of assistance to you.   Following are the goals we discussed today:   Goals Addressed             This Visit's Progress    Provide support, resources and guidance related to housing, mental health and other SDOH needs       Care Coordination Interventions:   Assisted pt with gift card to help with gas so she can get around town to seek housing options.  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 CSW celebrated this accomplishment with her and empowered her to keep taking positive steps for her self growth!   Reviewed Care Coordination Services:  Assessed Social Determinants of Health Made referral to Care Guide to assist with: housing, food insecurity, shelter resources Advised patient to go to [facility] for immediate assistance with shelter and resource options Pt shared with CSW she will have no place to reside as of 03/28/22 because the friend she has been staying with is moving and she cannot go with them. Pt has been residing with them for about 1 year after being in hotels for approx 2 years per pt report. Pt does have a car and SS income ($1700) and could benefit from linking with resources to seek housing, food, etc.  Motivational Interviewing employed Depression screen reviewed- pt scoring moderately high "18"- she denies any SI and expressed it to be a situational depressive state PHQ2/ PHQ9 completed Solution-Focused Strategies employed:  Active listening / Reflection utilized  Emotional Support Provided Problem [place] strategies reviewed Provided general psycho-education for mental health needs  Participation in counseling encouraged : pt declines- feels her depression symptoms are situational- related to current stressors with housing, etc.  Research officer, political party / information  provided  Suicidal Ideation/Homicidal Ideation assessed: denies- provided pt with "988" crisis line to call            Our next appointment is by telephone on 03/25/22  Please call the care guide team at [phone] if you need to cancel or reschedule your appointment.   If you are experiencing a Mental Health or [place] or need someone to talk to, please call the Suicide and Crisis Lifeline: 988 call 911   The patient verbalized understanding of instructions, educational materials, and care plan provided today and DECLINED offer to receive copy of patient instructions, educational materials, and care plan.   Telephone follow up appointment with care management team member scheduled for: 03/25/22   Eduard Clos, MSW, [place] Worker [employer] [phone]

## 2022-03-22 NOTE — Patient Outreach (Signed)
  Care Coordination   Follow Up Visit Note   03/22/2022 Name: Heather [name] MRN: [id number] DOB: [birth year]  Heather [name] is a 72 y.o. year old female who sees Sharion Settler, DO for primary care. I spoke with  Heather [name] by phone today.  What matters to the patients health and wellness today?  "Going to start a new job on Monday"    Goals Addressed             This Visit's Progress    Provide support, resources and guidance related to housing, mental health and other SDOH needs       Care Coordination Interventions:   Assisted pt with gift card to help with gas so she can get around town to seek housing options.  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 CSW celebrated this accomplishment with her and empowered her to keep taking positive steps for her self growth!   Reviewed Care Coordination Services:  Assessed Social Determinants of Health Made referral to Care Guide to assist with: housing, food insecurity, shelter resources Advised patient to go to [facility] for immediate assistance with shelter and resource options Pt shared with CSW she will have no place to reside as of 03/28/22 because the friend she has been staying with is moving and she cannot go with them. Pt has been residing with them for about 1 year after being in hotels for approx 2 years per pt report. Pt does have a car and SS income ($1700) and could benefit from linking with resources to seek housing, food, etc.  Motivational Interviewing employed Depression screen reviewed- pt scoring moderately high "18"- she denies any SI and expressed it to be a situational depressive state PHQ2/ PHQ9 completed Solution-Focused Strategies employed:  Active listening / Reflection utilized  Emotional Support Provided Problem [place] strategies reviewed Provided general psycho-education for mental health needs  Participation in counseling  encouraged : pt declines- feels her depression symptoms are situational- related to current stressors with housing, etc.  Research officer, political party / information provided  Suicidal Ideation/Homicidal Ideation assessed: denies- provided pt with "988" crisis line to call            SDOH assessments and interventions completed:  Yes     Care Coordination Interventions:  Yes, provided   Follow up plan: Follow up call scheduled for 03/25/22    Encounter Outcome:  Pt. Visit Completed

## 2022-03-22 NOTE — Telephone Encounter (Signed)
   Telephone encounter was:  Unsuccessful.  03/22/2022 Name: Heather [name] MRN: [id number] DOB: 12/10/1950  Unsuccessful outbound call made today to assist with:  Food Insecurity and homeless  Outreach Attempt:  1st Attempt  A HIPAA compliant voice message was left requesting a return call.  Instructed patient to call back at [phone].  [place] [phone] [address] , [place] [address] Email : Ashby Dawes. Greenauer-moran '@[email or web address]

## 2022-03-22 NOTE — Patient Instructions (Signed)
Visit Information  Thank you for taking time to visit with me today. Please don't hesitate to contact me if I can be of assistance to you.   Following are the goals we discussed today:   Goals Addressed             This Visit's Progress    Provide support, resources and guidance related to housing, mental health and other SDOH needs       Care Coordination Interventions:   Reviewed Care Coordination Services:  Assessed Social Determinants of Health Made referral to Care Guide to assist with: housing, food insecurity, shelter resources Advised patient to go to [facility] for immediate assistance with shelter and resource options Pt shared with CSW she will have no place to reside as of 03/28/22 because the friend she has been staying with is moving and she cannot go with them. Pt has been residing with them for about 1 year after being in hotels for approx 2 years per pt report. Pt does have a car and SS income ($1700) and could benefit from linking with resources to seek housing, food, etc.  Motivational Interviewing employed Depression screen reviewed- pt scoring moderately high "18"- she denies any SI and expressed it to be a situational depressive state PHQ2/ PHQ9 completed Solution-Focused Strategies employed:  Active listening / Reflection utilized  Emotional Support Provided Problem [place] strategies reviewed Provided general psycho-education for mental health needs  Participation in counseling encouraged : pt declines- feels her depression symptoms are situational- related to current stressors with housing, etc.  Research officer, political party / information provided  Suicidal Ideation/Homicidal Ideation assessed: denies- provided pt with "988" crisis line to call            Our next appointment is by telephone on 03/22/22   Please call the care guide team at [phone] if you need to cancel or reschedule your appointment.   If you are experiencing a Mental Health or  [place] or need someone to talk to, please call the Suicide and Crisis Lifeline: 988 call 911   The patient verbalized understanding of instructions, educational materials, and care plan provided today and DECLINED offer to receive copy of patient instructions, educational materials, and care plan.   Telephone follow up appointment with care management team member scheduled for:03/22/22  Eduard Clos, MSW, [place] Worker [employer] [phone]

## 2022-03-25 ENCOUNTER — Ambulatory Visit: Payer: Self-pay | Admitting: *Deleted

## 2022-03-25 ENCOUNTER — Telehealth: Payer: Self-pay | Admitting: *Deleted

## 2022-03-25 NOTE — Telephone Encounter (Signed)
   Telephone encounter was:  Unsuccessful.  03/25/2022 Name: Heather [name] MRN: [id number] DOB: [birth year]  Unsuccessful outbound call made today to assist with:  Transportation Needs   Outreach Attempt:2nd   [place] [phone] [address] , [place] [address] Email : Ashby Dawes. Greenauer-moran '@[email or web address]

## 2022-03-26 NOTE — Patient Instructions (Signed)
Visit Information  Thank you for taking time to visit with me today. Please don't hesitate to contact me if I can be of assistance to you.   Following are the goals we discussed today:   Goals Addressed             This Visit's Progress    Provide support, resources and guidance related to housing, mental health and other SDOH needs       Care Coordination Interventions:   Pt started new job today and says it went great! She was greeted by friendly staff and residence!Felt right at home and thrilled! Pt slept in her car this past Saturday after deciding the tension in the home she has been staying. She then was offered a bed by a close family friend and is staying there for now. Pt has received emails, texts and phone messages from the agencies referred to through [place].  Pt has received a lot of email and messages over the weekend- probably some from the [id number] referrals placed- encouraged pt to make a list of all the outreaches and call them back to pursue options!    CSW celebrated this accomplishment with her and empowered her to keep taking positive steps for her self growth!   Reviewed Care Coordination Services:  Assessed Social Determinants of Health Made referral to Care Guide to assist with: housing, food insecurity, shelter resources  Pt does have a car and SS income ($1700) and could benefit from linking with resources to seek housing, food, etc.  Motivational Interviewing employed Depression screen reviewed- pt scoring moderately high "18"- she denies any SI and expressed it to be a situational depressive state PHQ2/ PHQ9 completed Solution-Focused Strategies employed:  Active listening / Reflection utilized  Emotional Support Provided Problem [place] strategies reviewed Provided general psycho-education for mental health needs  Participation in counseling encouraged : pt declines- feels her depression symptoms are situational- related to  current stressors with housing, etc.  Research officer, political party / information provided  Suicidal Ideation/Homicidal Ideation assessed: denies- provided pt with "988" crisis line to call            Our next appointment is by telephone on 03/27/22  Please call the care guide team at [phone] if you need to cancel or reschedule your appointment.   If you are experiencing a Mental Health or [place] or need someone to talk to, please call the Suicide and Crisis Lifeline: 988 call 911   The patient verbalized understanding of instructions, educational materials, and care plan provided today and DECLINED offer to receive copy of patient instructions, educational materials, and care plan.   Telephone follow up appointment with care management team member scheduled for: 03/27/22  Eduard Clos, MSW, [place] Worker [employer] [facility] [phone]

## 2022-03-26 NOTE — Patient Outreach (Signed)
  Care Coordination   Follow Up Visit Note   03/26/2022 Name: Heather [name] MRN: [id number] DOB: 1951/01/06  Heather [name] is a 72 y.o. year old female who sees Sharion Settler, DO for primary care. I spoke with  Heather [name] by phone today.  What matters to the patients health and wellness today?  "I started my new job today!"    Goals Addressed             This Visit's Progress    Provide support, resources and guidance related to housing, mental health and other SDOH needs       Care Coordination Interventions:   Pt started new job today and says it went great! She was greeted by friendly staff and residence!Felt right at home and thrilled! Pt slept in her car this past Saturday after deciding the tension in the home she has been staying. She then was offered a bed by a close family friend and is staying there for now. Pt has received emails, texts and phone messages from the agencies referred to through [place].  Pt has received a lot of email and messages over the weekend- probably some from the [id number] referrals placed- encouraged pt to make a list of all the outreaches and call them back to pursue options!    CSW celebrated this accomplishment with her and empowered her to keep taking positive steps for her self growth!   Reviewed Care Coordination Services:  Assessed Social Determinants of Health Made referral to Care Guide to assist with: housing, food insecurity, shelter resources  Pt does have a car and SS income ($1700) and could benefit from linking with resources to seek housing, food, etc.  Motivational Interviewing employed Depression screen reviewed- pt scoring moderately high "18"- she denies any SI and expressed it to be a situational depressive state PHQ2/ PHQ9 completed Solution-Focused Strategies employed:  Active listening / Reflection utilized  Emotional Support Provided Problem [place] strategies reviewed Provided general  psycho-education for mental health needs  Participation in counseling encouraged : pt declines- feels her depression symptoms are situational- related to current stressors with housing, etc.  Research officer, political party / information provided  Suicidal Ideation/Homicidal Ideation assessed: denies- provided pt with "988" crisis line to call            SDOH assessments and interventions completed:  Yes     Care Coordination Interventions:  Yes, provided   Follow up plan: Follow up call scheduled for 03/27/22    Encounter Outcome:  Pt. Visit Completed

## 2022-03-27 ENCOUNTER — Telehealth: Payer: Self-pay | Admitting: *Deleted

## 2022-03-27 NOTE — Telephone Encounter (Signed)
   Telephone encounter was:  Unsuccessful.  03/27/2022 Name: Heather [name] MRN: [id number] DOB: 1950-05-07  Unsuccessful outbound call made today to assist with:  Transportation Needs   Outreach Attempt:  3rd Attempt.  Referral closed unable to contact patient.  A HIPAA compliant voice message was left requesting a return call.  Instructed patient to call back at [phone]. [place] [phone] [address] , [place] [address] Email : Ashby Dawes. Greenauer-moran '@[email or web address]

## 2022-03-29 ENCOUNTER — Telehealth: Payer: Self-pay | Admitting: *Deleted

## 2022-03-29 ENCOUNTER — Ambulatory Visit (INDEPENDENT_AMBULATORY_CARE_PROVIDER_SITE_OTHER): Payer: Medicare PPO | Admitting: Family Medicine

## 2022-03-29 VITALS — BP 190/80 | HR 71 | Ht 67.0 in | Wt 203.5 lb

## 2022-03-29 DIAGNOSIS — I10 Essential (primary) hypertension: Secondary | ICD-10-CM | POA: Diagnosis not present

## 2022-03-29 DIAGNOSIS — E1165 Type 2 diabetes mellitus with hyperglycemia: Secondary | ICD-10-CM

## 2022-03-29 DIAGNOSIS — E782 Mixed hyperlipidemia: Secondary | ICD-10-CM | POA: Diagnosis not present

## 2022-03-29 LAB — POCT GLYCOSYLATED HEMOGLOBIN (HGB A1C): HbA1c, POC (controlled diabetic range): 9.3 % — AB (ref 0.0–7.0)

## 2022-03-29 MED ORDER — AMLODIPINE-OLMESARTAN 10-40 MG PO TABS: 1.0000 | ORAL_TABLET | Freq: Every day | ORAL | 1 refills | Status: DC

## 2022-03-29 MED ORDER — SEMAGLUTIDE(0.25 OR 0.5MG/DOS) 2 MG/1.5ML ~~LOC~~ SOPN: 0.2500 mg | PEN_INJECTOR | SUBCUTANEOUS | 3 refills | Status: DC

## 2022-03-29 MED ORDER — NEEDLES & SYRINGES MISC: 1.0000 | Freq: Every day | 2 refills | Status: DC

## 2022-03-29 MED ORDER — ROSUVASTATIN CALCIUM 40 MG PO TABS: 40.0000 mg | ORAL_TABLET | Freq: Every day | ORAL | 3 refills | Status: DC

## 2022-03-29 NOTE — Telephone Encounter (Signed)
   Telephone encounter was:  Unsuccessful.  03/29/2022 Name: Heather [name] MRN: [id number] DOB: December 10, 1950  Unsuccessful outbound call made today to assist with:  Transportation Needs  and Food Insecurity  Outreach Attempt:  3rd Attempt.  Referral closed unable to contact patient.  A HIPAA compliant voice message was left requesting a return call.  Instructed patient to call back at [phone].  [place] [phone] [address] , [place] [address] Email : Ashby Dawes. Greenauer-moran [email or web address]

## 2022-03-29 NOTE — Progress Notes (Signed)
SUBJECTIVE:   CHIEF COMPLAINT / HPI:   Heather [name] is a 72 y.o. female who presents to the [facility] clinic today to discuss the following concerns:   Diabetes, Type 2 - Last A1c  Lab Results  Component Value Date   HGBA1C 9.3 (A) 03/29/2022  - Medications: Prescribed Jardiance 25 mg, metformin 500 mg twice daily, Ozempic 1 mg weekly but has not been taking her medications.  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  - Denies symptoms of hypoglycemia, polyuria, polydipsia, numbness extremities, foot ulcers/trauma  Chronic HTN Home BP Monitoring - No Chest pain- no    Dyspnea- no Headache - no  Medications: Prescribed Amlodipine 10 mg, Coreg 3.125 mg twice daily- has not been taking.  Compliance-  no      Lightheadedness-  no              Edema- no   Preventitive Healthcare:  Exercise: no   Diet Pattern: Eating like "crazy", working on diet  Salt Restriction: Work in progress  PERTINENT  PMH / PSH: pAF, CAD, GERD, NSTEMI   OBJECTIVE:   BP (!) 184/84   Pulse 71   Ht '5\' 7"'$  (1.702 m)   Wt 203 lb 8 oz (92.3 kg)   SpO2 100%   BMI 31.87 kg/m   Vitals:   03/29/22 1537 03/29/22 1608  BP: (!) 184/84 (!) 190/80  Pulse: 71   SpO2: 100%    General: NAD, pleasant, able to participate in exam Cardiac: RRR, no murmurs. Respiratory: CTAB, normal effort, No wheezes, rales or rhonchi Extremities: no edema or cyanosis. Psych: Normal affect and mood     03/29/2022    3:36 PM 03/21/2022   12:45 PM 03/20/2022    9:05 AM  Depression screen PHQ 2/9  Decreased Interest '2 3 3  '$ Down, Depressed, Hopeless '1 3 3  '$ PHQ - 2 Score '3 6 6  '$ Altered sleeping '1 3 2  '$ Tired, decreased energy '1 3 2  '$ Change in appetite '1 2 2  '$ Feeling bad or failure about yourself  '2 3 3  '$ Trouble  concentrating  1 2  Moving slowly or fidgety/restless 0 0 1  Suicidal thoughts 0 0 2  PHQ-9 Score '8 18 20  '$ Difficult doing work/chores Not difficult at all  Somewhat difficult   ASSESSMENT/PLAN:   1. Type 2 diabetes mellitus with hyperglycemia, without long-term current use of insulin (HCC) A1c is higher than previous due to medication non-adherence. Ms. Trice did not want to restart on Metformin. She would like to reduce medication pill burden. Currently she is not taking any of her medications. We discussed continuing Ozempic (will restart at initial dosing given she has not been taking for a while). Given age, will set her A1c goal of <8.  - Next HgB A1c in 3 months  - rosuvastatin (CRESTOR) 40 MG tablet; Take 1 tablet (40 mg total) by mouth daily.  Dispense: 90 tablet; Refill: 3 - Semaglutide,0.25 or 0.'5MG'$ /DOS, 2 MG/1.5ML SOPN; Inject 0.25 mg into the skin once a week. 0.25 mg once weekly for 4 weeks then increase to 0.5 mg weekly for at least 4 weeks,max 1 mg  Dispense: 3 mL; Refill: 3 - Needles & Syringes MISC; 1 Container  by Does not apply route daily.  Dispense: 90 each; Refill: 2 - Due for eye examination - UTD on urine microalbumin   2. Mixed hyperlipidemia Will plan to check lab work at next follow up.  - rosuvastatin (CRESTOR) 40 MG tablet; Take 1 tablet (40 mg total) by mouth daily.  Dispense: 90 tablet; Refill: 3 - Lipid Panel; Future  3. Primary hypertension Elevated x times today. This is not surprising as she has been off of her medications. Asymptomatic. We had a long conversation regarding the effects of uncontrolled hypertension. She was amenable to switching to single pill antihypertensive medication to reduce pill burden. - amLODipine-olmesartan (AZOR) 10-40 MG tablet; Take 1 tablet by mouth daily.  Dispense: 30 tablet; Refill: 1 - Basic Metabolic Panel; Future -Follow-up scheduled me in 1.5 weeks for lab work and repeat blood pressure check  Sharion Settler,  [place]

## 2022-03-29 NOTE — Patient Instructions (Addendum)
It was wonderful to see you today.  Please bring ALL of your medications with you to every visit.   Today we talked about:  I placed future lab orders to check your cholesterol and electrolytes in 1 week. I will send you a MyChart message if you have MyChart. Otherwise, I will give you a call for abnormal results or send a letter if everything returned back normal. If you don't hear from me in 2 weeks, please call the office.  We are starting you on a combination blood pressure pill (amlodipine and olmesartan). Please return in 1 week for a blood pressure check.  We discussed other important medications including your eliquis, carvedilol, and rosuvastatin. This puts you at 4 pills daily, I hope you can do this!  In addition, we discussed restarting your Ozempic at a lower dose. This is a once a week injectable.     I will see you on 2/13 for a follow up.   Thank you for coming to your visit as scheduled. We have had a large "no-show" problem lately, and this significantly limits our ability to see and care for patients. As a friendly reminder- if you cannot make your appointment please call to cancel. We do have a no show policy for those who do not cancel within 24 hours. Our policy is that if you miss or fail to cancel an appointment within 24 hours, 3 times in a 52-monthperiod, you may be dismissed from our clinic.   Thank you for choosing [facility]   Please call [phone] any questions about today's appointment.  Please be sure to schedule follow up at the front  desk before you leave today.   ASharion Settler DO PGY-3 Family Medicine

## 2022-04-02 ENCOUNTER — Ambulatory Visit: Payer: Medicare PPO

## 2022-04-05 ENCOUNTER — Encounter: Payer: Self-pay | Admitting: *Deleted

## 2022-04-09 ENCOUNTER — Ambulatory Visit (INDEPENDENT_AMBULATORY_CARE_PROVIDER_SITE_OTHER): Payer: Medicare PPO | Admitting: Family Medicine

## 2022-04-09 ENCOUNTER — Encounter: Payer: Self-pay | Admitting: Family Medicine

## 2022-04-09 ENCOUNTER — Other Ambulatory Visit: Payer: Self-pay

## 2022-04-09 ENCOUNTER — Ambulatory Visit: Payer: Self-pay | Admitting: *Deleted

## 2022-04-09 VITALS — BP 146/82 | HR 77 | Ht 67.0 in | Wt 200.0 lb

## 2022-04-09 DIAGNOSIS — E782 Mixed hyperlipidemia: Secondary | ICD-10-CM

## 2022-04-09 DIAGNOSIS — I10 Essential (primary) hypertension: Secondary | ICD-10-CM

## 2022-04-09 NOTE — Patient Outreach (Signed)
  Care Coordination   Follow Up Visit Note   04/09/2022 Name: Heather [name] MRN: [id number] DOB: May 31, 1950  Heather [name] is a 72 y.o. year old female who sees Sharion Settler, DO for primary care. I spoke with  Heather [name] by phone today.  What matters to the patients health and wellness today?  Housing needs.       Goals Addressed             This Visit's Progress    Provide support, resources and guidance related to housing, mental health and other SDOH needs       Care Coordination Interventions:   CSW met with pt today at PCP office to assist with challenges pt was facing with retrieving emails and phone calls from [facility] referrals.  CSW made phone call to [employer] to assist with re-connecting pt with housing support.  Pt is continuing at her new job and is enjoying it! She has gotten a paycheck (partial for 1st week) and looking forward to a full/2 week paycheck next pay period.    CSW celebrated this accomplishment with her and empowered her to keep taking positive steps for her self growth!   Reviewed Care Coordination Services:  Assessed Social Determinants of Health Made referral to Care Guide to assist with: housing, food insecurity, shelter resources  Pt does have a car and SS income ($1700) and could benefit from linking with resources to seek housing, food, etc.  Motivational Interviewing employed Depression screen reviewed- pt scoring moderately high "18"- she denies any SI and expressed it to be a situational depressive state PHQ2/ PHQ9 completed Solution-Focused Strategies employed:  Active listening / Reflection utilized  Emotional Support Provided Problem [place] strategies reviewed Provided general psycho-education for mental health needs  Participation in counseling encouraged : pt declines- feels her depression symptoms are situational- related to current stressors with housing, etc.  Research officer, political party /  information provided  Suicidal Ideation/Homicidal Ideation assessed: denies- provided pt with "988" crisis line to call            SDOH assessments and interventions completed:  Yes     Care Coordination Interventions:  Yes, provided   Follow up plan: Referral made to [facility] again Follow up call scheduled for 04/12/22    Encounter Outcome:  Pt. Visit Completed

## 2022-04-09 NOTE — Patient Instructions (Signed)
Visit Information  Thank you for taking time to visit with me today. Please don't hesitate to contact me if I can be of assistance to you.   Following are the goals we discussed today:   Goals Addressed             This Visit's Progress    Provide support, resources and guidance related to housing, mental health and other SDOH needs       Care Coordination Interventions:   CSW met with pt today at PCP office to assist with challenges pt was facing with retrieving emails and phone calls from [facility] referrals.  CSW made phone call to [employer] to assist with re-connecting pt with housing support.  Pt is continuing at her new job and is enjoying it! She has gotten a paycheck (partial for 1st week) and looking forward to a full/2 week paycheck next pay period.    CSW celebrated this accomplishment with her and empowered her to keep taking positive steps for her self growth!   Reviewed Care Coordination Services:  Assessed Social Determinants of Health Made referral to Care Guide to assist with: housing, food insecurity, shelter resources  Pt does have a car and SS income ($1700) and could benefit from linking with resources to seek housing, food, etc.  Motivational Interviewing employed Depression screen reviewed- pt scoring moderately high "18"- she denies any SI and expressed it to be a situational depressive state PHQ2/ PHQ9 completed Solution-Focused Strategies employed:  Active listening / Reflection utilized  Emotional Support Provided Problem [place] strategies reviewed Provided general psycho-education for mental health needs  Participation in counseling encouraged : pt declines- feels her depression symptoms are situational- related to current stressors with housing, etc.  Research officer, political party / information provided  Suicidal Ideation/Homicidal Ideation assessed: denies- provided pt with "988" crisis line to call            Our  next appointment is by telephone on 04/12/22  Please call the care guide team at [phone] if you need to cancel or reschedule your appointment.   If you are experiencing a Mental Health or [place] or need someone to talk to, please call the Suicide and Crisis Lifeline: 988 call 911   The patient verbalized understanding of instructions, educational materials, and care plan provided today and DECLINED offer to receive copy of patient instructions, educational materials, and care plan.   Telephone follow up appointment with care management team member scheduled for: 04/12/22  Eduard Clos, MSW, [place] Worker [facility] [facility] [phone]

## 2022-04-09 NOTE — Progress Notes (Signed)
    SUBJECTIVE:   CHIEF COMPLAINT / HPI:   Heather [name] is a 72 y.o. female who presents to the [facility] clinic today to discuss the following concerns:   F/u BP  Returns today for blood pressure check and BMP. She was last seen on 2/2. She continued to have elevated blood pressures but admitted to not taking her medications. She was switched to a single pill antihypertensive at that time after shared decision making (amlodipine-olmesartan 10-40 mg). She returns today for blood pressure check and BMP.   She has been taking her blood pressure pills daily without adverse effects. She did not take her medications today, however. She notes today was a stressful day at work.   She reports that she has not been able to take any of her other medications as her pharmacy would not refill as she had been given a 90 day supply. All of her medications are somewhere in her car.   HLD At last visit she was started on crestor 40 mg daily but she has not been able to start this yet. Returns today for lipid panel check.   PERTINENT  PMH / PSH: T2DM, poor social situation   OBJECTIVE:   BP (!) 171/87   Pulse 77   Ht '5\' 7"'$  (1.702 m)   Wt 200 lb (90.7 kg)   SpO2 100%   BMI 31.32 kg/m   Vitals:   04/09/22 1550 04/09/22 1613  BP: (!) 171/87 (!) 146/82  Pulse: 77   SpO2: 100%    General: NAD, pleasant, able to participate in exam Cardiac: RRR, no murmurs. Respiratory: CTAB, normal effort, No wheezes, rales or rhonchi Extremities: no edema  Psych: Normal affect and mood  ASSESSMENT/PLAN:   1. Primary hypertension Elevated BP but improved on recheck. She has been adherent to her single pill combination pill. Commended patient on this. Will not adjust medications at this time as patient seems to be hesitant to medication adjustments.  - Basic Metabolic Panel  2. Mixed hyperlipidemia Unfortunately has not yet started her statin. Suspect that lipids will be elevated, also as a result of uncontrolled  diabetes. She is due for lipid panel check.  - Lipid Panel    Sharion Settler, [place]

## 2022-04-09 NOTE — Patient Instructions (Addendum)
It was wonderful to see you today.  Please bring ALL of your medications with you to every visit.   Today we talked about:  Your blood pressure is still high, but much better than previous! Continue your current medications.   We are doing lab work today to check your electrolytes and kidney function as well as cholesterol. I will send you a MyChart message if you have MyChart. Otherwise, I will give you a call for abnormal results or send a letter if everything returned back normal. If you don't hear from me in 2 weeks, please call the office.    I would encourage you to look for your medications in your car and start back up on your medications. Medications that are especially important include your Eliquis, coreg, and rosuvastatin. I would also empower you to continue to discuss with the pharmacy and pick up your medications once they are available.   Thank you for coming to your visit as scheduled. We have had a large "no-show" problem lately, and this significantly limits our ability to see and care for patients. As a friendly reminder- if you cannot make your appointment please call to cancel. We do have a no show policy for those who do not cancel within 24 hours. Our policy is that if you miss or fail to cancel an appointment within 24 hours, 3 times in a 35-monthperiod, you may be dismissed from our clinic.   Thank you for choosing [facility]   Please call [phone] any questions about today's appointment.  Please be sure to schedule follow up at the front  desk before you leave today.   ASharion Settler DO PGY-3 Family Medicine

## 2022-04-10 ENCOUNTER — Telehealth: Payer: Self-pay | Admitting: Family Medicine

## 2022-04-10 DIAGNOSIS — I10 Essential (primary) hypertension: Secondary | ICD-10-CM

## 2022-04-10 DIAGNOSIS — H43822 Vitreomacular adhesion, left eye: Secondary | ICD-10-CM | POA: Diagnosis not present

## 2022-04-10 DIAGNOSIS — H31003 Unspecified chorioretinal scars, bilateral: Secondary | ICD-10-CM | POA: Diagnosis not present

## 2022-04-10 DIAGNOSIS — E133213 Other specified diabetes mellitus with mild nonproliferative diabetic retinopathy with macular edema, bilateral: Secondary | ICD-10-CM | POA: Diagnosis not present

## 2022-04-10 DIAGNOSIS — E113513 Type 2 diabetes mellitus with proliferative diabetic retinopathy with macular edema, bilateral: Secondary | ICD-10-CM | POA: Diagnosis not present

## 2022-04-10 LAB — BASIC METABOLIC PANEL
BUN/Creatinine Ratio: 17 (ref 12–28)
BUN: 31 mg/dL — ABNORMAL HIGH (ref 8–27)
CO2: 21 mmol/L (ref 20–29)
Calcium: 10.2 mg/dL (ref 8.7–10.3)
Chloride: 105 mmol/L (ref 96–106)
Creatinine, Ser: 1.86 mg/dL — ABNORMAL HIGH (ref 0.57–1.00)
Glucose: 159 mg/dL — ABNORMAL HIGH (ref 70–99)
Potassium: 5 mmol/L (ref 3.5–5.2)
Sodium: 141 mmol/L (ref 134–144)
eGFR: 29 mL/min/{1.73_m2} — ABNORMAL LOW (ref >59)

## 2022-04-10 LAB — LIPID PANEL
Chol/HDL Ratio: 3.6 ratio (ref 0.0–4.4)
Cholesterol, Total: 280 mg/dL — ABNORMAL HIGH (ref 100–199)
HDL: 78 mg/dL (ref >39)
LDL Chol Calc (NIH): 170 mg/dL — ABNORMAL HIGH (ref 0–99)
Triglycerides: 179 mg/dL — ABNORMAL HIGH (ref 0–149)
VLDL Cholesterol Cal: 32 mg/dL (ref 5–40)

## 2022-04-10 NOTE — Telephone Encounter (Signed)
Reviewed lab work. Elevated lipids, not unsurprising as she has been off statin. Would recommend that she take her high intensity statin.  Elevated creatinine in the setting of re-starting ARB. Would recommend patient half her blood pressure pill (amlodipine-olmesartan) and return for repeat blood work in 1 week.   Called patient x2, unable to reach her. Left HIPAA compliant voicemail explaining results and recommendations.   Will order future BMP for 1 week.

## 2022-04-22 ENCOUNTER — Telehealth: Payer: Self-pay

## 2022-04-22 NOTE — Telephone Encounter (Signed)
Attempted to contact patient for potential enrollment in LIBERATE CGM Study.   Left HIPAA compliant voice mail requesting call back to direct phone: [phone]  Total time with patient call and documentation of interaction: 3 minutes.

## 2022-04-26 ENCOUNTER — Encounter: Payer: Self-pay | Admitting: *Deleted

## 2022-04-26 NOTE — Patient Outreach (Signed)
  Care Coordination   Follow Up Visit Note   04/26/2022 Name: SAMIAH CULLEN MRN: [id number] DOB: 1950-04-25  Dwyane Dee Hubbert is a 72 y.o. year old female who sees Sharion Settler, DO for primary care. I spoke with  Dwyane Dee Bakken by phone today.  What matters to the patients health and wellness today?  Housing, health, safety    Goals Addressed             This Visit's Progress    Provide support, resources and guidance related to housing, mental health and other SDOH needs       Activities and task to complete in order to accomplish goals.   Follow up with housing resources discussed (may have a waiting period for availability so keep searching and continue with your plan to collect a down payment for security deposit, getting lights turned on, etc.) Continue with compliance of taking medication prescribed by Doctor Personal steps you want to take over the next few weeks ( Accumulate some savings for moving. [place] trip with your church!) Follow up on the community resources provided by care guide as well as the [facility] referral placed for resources             SDOH assessments and interventions completed:  Yes     Care Coordination Interventions:  Yes, provided  Interventions Today    Flowsheet Row Most Recent Value  Chronic Disease   Chronic disease during today's visit Hypertension (HTN)  General Interventions   General Interventions Discussed/Reviewed Community Resources  Mental Health Interventions   Mental Health Discussed/Reviewed Mental Health Discussed, Coping Strategies, Other  [housing concerns]       Follow up plan: Follow up call scheduled for 05/29/22    Encounter Outcome:  Pt. Visit Completed

## 2022-04-26 NOTE — Patient Instructions (Signed)
Visit Information  Thank you for taking time to visit with me today. Please don't hesitate to contact me if I can be of assistance to you.   Following are the goals we discussed today:   Goals Addressed             This Visit's Progress    Provide support, resources and guidance related to housing, mental health and other SDOH needs       Activities and task to complete in order to accomplish goals.   Follow up with housing resources discussed (may have a waiting period for availability so keep searching and continue with your plan to collect a down payment for security deposit, getting lights turned on, etc.) Continue with compliance of taking medication prescribed by Doctor Personal steps you want to take over the next few weeks ( Accumulate some savings for moving. [place] trip with your church!) Follow up on the community resources provided by care guide as well as the [place] referral placed for resources  Continue working! So glad this is going well!            Our next appointment is by telephone on 05/29/22    Please call the care guide team at [phone] if you need to cancel or reschedule your appointment.   If you are experiencing a Mental Health or [place] or need someone to talk to, please call 911   The patient verbalized understanding of instructions, educational materials, and care plan provided today and DECLINED offer to receive copy of patient instructions, educational materials, and care plan.   Telephone follow up appointment with care management team member scheduled for:05/29/22  Eduard Clos, MSW, [place] Worker [employer] [employer] [facility] [phone]

## 2022-05-02 ENCOUNTER — Ambulatory Visit: Payer: Medicare PPO | Admitting: Pharmacist

## 2022-05-07 NOTE — Progress Notes (Deleted)
Patient ID: Heather [name]                 DOB: [birth year]                      MRN: [id number]     HPI: Heather [name] is a 72 y.o. female referred by Dr. Irish Lack to HTN clinic. 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 Previously seen in lipid clinic with baseline LDL of 195 which improved to 54 on atorvastatin '80mg'$  daily and Praluent '150mg'$  Q2W. Last seen by Dr Irish Lack 01/14/22, had stopped taking all of her medications in September due to fatigue - "didn't feel like doing anything." Fatigue improved notably when off of her meds. BP was 170/82 at that visit. She was restarted on Eliquis, olmesartan, and metformin and referred to PharmD for follow up.  I saw pt on 01/22/22, BP elevated at 182/92. She was still taking her Eliquis, olmesartan, and metformin. I resumed her amlodipine, Jardiance, and Plavix. Advised her after 1 week if tolerating those meds well, to restart Praluent and statin. Replaced her atorvastatin with rosuvastatin in case atorvastatin was contributing to fatigue. Held off on resuming duloxetine and carvedilol as these meds were most likely to contribute to her fatigue. Of note, pt with ongoing stress - was without heat in her home and oven broke on Thanksgiving. Pt scheduled for 3 week follow up in Dec 2023 but rescheduled to today.  Increase activity, watching salt? Checking bp at home? Confirm meds go through for adherence Fatigue any better? Restart BB for NSTEMI (coreg or another) What dose of ozempic? Inc   Pt presents today for follow up. Does confirm adherence with the 3 meds resumed at last visit, is still off her other meds except she did take a lower dose of Ozempic 0.'5mg'$  over the weekend which she tolerated well. Has been tolerating resumption of her Eliquis, olmesartan and metformin well. Sugar has been 165-180 fasting. Has ongoing stress - current housing does not have heat and her oven  broke on Thanksgiving.  Current HTN meds: olmesartan '20mg'$  daily  BP goal: <130/20mHg  Family History: Alcohol abuse in her father and mother; CAD in her maternal grandmother; Diabetes in her father; Stroke in her mother.    Social History: The patient  reports that she has never smoked. She has never used smokeless tobacco. She reports that she does not drink alcohol and does not use drugs.    Diet: watches her salt  Exercise: not active lately  Home BP readings: needs new battery for her cuff  Wt Readings from Last 3 Encounters:  04/09/22 200 lb (90.7 kg)  03/29/22 203 lb 8 oz (92.3 kg)  03/20/22 197 lb 12.8 oz (89.7 kg)   BP Readings from Last 3 Encounters:  04/09/22 (!) 146/82  03/29/22 (!) 190/80  03/20/22 (!) 160/100   Pulse Readings from Last 3 Encounters:  04/09/22 77  03/29/22 71  01/22/22 79    Renal function: CrCl cannot be calculated (Patient's most recent lab result is older than the maximum 21 days allowed.).  Past Medical History:  Diagnosis Date   Abscess, gluteal, right 05/25/2016   Depression    Diabetes mellitus    Encounter for hepatitis C screening test for low risk patient 04/13/2018   Screening 04/13/2018   Food insecurity 04/13/2018   Glaucoma    Hyperlipidemia  Hypertension    Medicare annual wellness visit, initial 04/13/2018   Patient is able to have AWV initial   Myocardial infarction [facility])    Neuropathy    Osteoporosis    Other chest pain 01/01/2016   Screening for breast cancer 04/13/2018   Sepsis ([place]) 05/25/2016   Tibial plateau fracture, left 02/16/2015   Uncontrolled diabetes mellitus with diabetic neuropathy, with long-term current use of insulin 01/01/2016    Current Outpatient Medications on File Prior to Visit  Medication Sig Dispense Refill   Alirocumab (PRALUENT) 150 MG/ML SOAJ Inject 1 pen. into the skin every 14 (fourteen) days. (Patient not taking: Reported on 01/14/2022) 2 mL 5   amLODipine-olmesartan (AZOR)  10-40 MG tablet Take 1 tablet by mouth daily. 30 tablet 1   apixaban (ELIQUIS) 5 MG TABS tablet Take 1 tablet (5 mg total) by mouth 2 (two) times daily. (Patient not taking: Reported on 03/20/2022) 60 tablet 5   blood glucose meter kit and supplies KIT Dispense based on patient and insurance preference. Use up to four times daily as directed. (Patient not taking: Reported on 09/12/2021) 1 each 0   carvedilol (COREG) 3.125 MG tablet Take 1 tablet (3.125 mg total) by mouth 2 (two) times daily with a meal. (Patient not taking: Reported on 09/12/2021) 60 tablet 5   clopidogrel (PLAVIX) 75 MG tablet TAKE 1 TABLET BY MOUTH EVERY DAY WITH BREAKFAST (Patient not taking: Reported on 03/20/2022) 30 tablet 5   diclofenac Sodium (VOLTAREN) 1 % GEL APPLY 2 TO 4 GRAMS TOPICALLY 4 (FOUR) TIMES DAILY. (Patient not taking: Reported on 09/12/2021) 100 g 1   DULoxetine (CYMBALTA) 60 MG capsule Take 1 capsule (60 mg total) by mouth daily. (Patient not taking: Reported on 09/12/2021) 30 capsule 5   empagliflozin (JARDIANCE) 25 MG TABS tablet Take 1 tablet (25 mg total) by mouth daily. (Patient not taking: Reported on 03/20/2022) 30 tablet 5   Lancets (ONETOUCH DELICA PLUS 123XX123) MISC USE AS DIRECTED to check sugars once daily (Patient not taking: Reported on 09/12/2021) 100 each 3   metFORMIN (GLUCOPHAGE-XR) 500 MG 24 hr tablet Take 1 tablet (500 mg total) by mouth 2 (two) times daily. (Patient not taking: Reported on 03/20/2022) 60 tablet 5   Needles & Syringes MISC 1 Container by Does not apply route daily. 90 each 2   nitroGLYCERIN (NITROSTAT) 0.4 MG SL tablet Place 1 tablet (0.4 mg total) under the tongue every 5 (five) minutes as needed for chest pain. (Patient not taking: Reported on 09/12/2021) 25 tablet 2   prednisoLONE acetate (PRED FORTE) 1 % ophthalmic suspension Place 1 drop into the right eye 4 (four) times daily. (Patient not taking: Reported on 09/12/2021)     rosuvastatin (CRESTOR) 40 MG tablet Take 1 tablet (40 mg  total) by mouth daily. (Patient not taking: Reported on 04/09/2022) 90 tablet 3   Semaglutide,0.25 or 0.'5MG'$ /DOS, 2 MG/1.5ML SOPN Inject 0.25 mg into the skin once a week. 0.25 mg once weekly for 4 weeks then increase to 0.5 mg weekly for at least 4 weeks,max 1 mg 3 mL 3   No current facility-administered medications on file prior to visit.    No Known Allergies   Assessment/Plan:  1. Hypertension - BP remains elevated above goal < 130/51mHg. Checking BMET today with resumption of olmesartan '20mg'$  daily at last visit. Will restart amlodipine - '5mg'$  daily x 4 days, then back up to '10mg'$  daily maintenance dose. Pt will look for new batteries and start monitoring her  BP again. Plans to increase activity and continue watching her salt.  2. Hyperlipidemia - She has not been taking her statin or Praluent. Baseline LDL is almost 200, previously lipids very well controlled on statin and PCSK9i. If she tolerates above changes with BP meds, will have her restart lipid meds in 1-2 weeks with Praluent '150mg'$  Q2W. Will change atorvastatin '80mg'$  to rosuvastatin '40mg'$  daily at that time in case atorvastatin had been contributing to her fatigue.  3. DM - She reports tolerating her Jardiance well in the past, she will restart this. She'll continue on metformin and the lower dose of Ozempic 0.'5mg'$  weekly she's been taking, will plan to titrate back up to '1mg'$  at next visit.  Will also have her restart Plavix. This should not have contributed to her fatigue. Still remains off her carvedilol and duloxetine, both of which could have contributed to her fatigue. Does report the duloxetine helped with foot pain but may need new med if this has been causing her fatigue. Will reassess med tolerability at next visit in 3 weeks.  Avila Albritton E. Anai Lipson, PharmD, BCACP, [place] [id number], [place], [place] [address] Phone: [phone]; Fax: [phone] 05/07/2022 3:54 PM

## 2022-05-08 ENCOUNTER — Ambulatory Visit: Payer: Medicare PPO

## 2022-05-08 DIAGNOSIS — H31003 Unspecified chorioretinal scars, bilateral: Secondary | ICD-10-CM | POA: Diagnosis not present

## 2022-05-08 DIAGNOSIS — H43822 Vitreomacular adhesion, left eye: Secondary | ICD-10-CM | POA: Diagnosis not present

## 2022-05-08 DIAGNOSIS — E113513 Type 2 diabetes mellitus with proliferative diabetic retinopathy with macular edema, bilateral: Secondary | ICD-10-CM | POA: Diagnosis not present

## 2022-05-09 ENCOUNTER — Ambulatory Visit (INDEPENDENT_AMBULATORY_CARE_PROVIDER_SITE_OTHER): Payer: Medicare PPO | Admitting: Pharmacist

## 2022-05-09 VITALS — BP 154/67 | HR 76

## 2022-05-09 DIAGNOSIS — E782 Mixed hyperlipidemia: Secondary | ICD-10-CM

## 2022-05-09 DIAGNOSIS — I480 Paroxysmal atrial fibrillation: Secondary | ICD-10-CM

## 2022-05-09 DIAGNOSIS — I10 Essential (primary) hypertension: Secondary | ICD-10-CM

## 2022-05-09 DIAGNOSIS — I25110 Atherosclerotic heart disease of native coronary artery with unstable angina pectoris: Secondary | ICD-10-CM

## 2022-05-09 DIAGNOSIS — E1165 Type 2 diabetes mellitus with hyperglycemia: Secondary | ICD-10-CM

## 2022-05-09 DIAGNOSIS — I214 Non-ST elevation (NSTEMI) myocardial infarction: Secondary | ICD-10-CM

## 2022-05-09 LAB — POCT GLYCOSYLATED HEMOGLOBIN (HGB A1C): HbA1c, POC (controlled diabetic range): 8.7 % — AB (ref 0.0–7.0)

## 2022-05-09 MED ORDER — CARVEDILOL 3.125 MG PO TABS: 3.1250 mg | ORAL_TABLET | Freq: Two times a day (BID) | ORAL | 5 refills | Status: DC

## 2022-05-09 MED ORDER — APIXABAN 5 MG PO TABS: 5.0000 mg | ORAL_TABLET | Freq: Two times a day (BID) | ORAL | 5 refills | Status: DC

## 2022-05-09 MED ORDER — SEMAGLUTIDE(0.25 OR 0.5MG/DOS) 2 MG/1.5ML ~~LOC~~ SOPN: 0.2500 mg | PEN_INJECTOR | SUBCUTANEOUS | 3 refills | Status: DC

## 2022-05-09 MED ORDER — CLOPIDOGREL BISULFATE 75 MG PO TABS: ORAL_TABLET | ORAL | 5 refills | Status: DC

## 2022-05-09 MED ORDER — ROSUVASTATIN CALCIUM 40 MG PO TABS: 40.0000 mg | ORAL_TABLET | Freq: Every day | ORAL | 3 refills | Status: DC

## 2022-05-09 MED ORDER — DULOXETINE HCL 60 MG PO CPEP: 60.0000 mg | ORAL_CAPSULE | Freq: Every day | ORAL | 5 refills | Status: DC

## 2022-05-09 MED ORDER — EMPAGLIFLOZIN 25 MG PO TABS: 25.0000 mg | ORAL_TABLET | Freq: Every day | ORAL | 5 refills | Status: DC

## 2022-05-09 MED ORDER — PRALUENT 150 MG/ML ~~LOC~~ SOAJ: 1.0000 "pen " | SUBCUTANEOUS | 5 refills | Status: DC

## 2022-05-09 NOTE — Patient Instructions (Addendum)
It was nice to see you today!  Your goal blood sugar is 80-130 before eating and less than 180 after eating.  Medication Changes: Twice daily medications: Please restart Eliquis 5 mg twice daily, carvediolol 3.125 mg twice daily Once daily medications: Please restart Plavix 75 mg daily, Cymbalta 60 mg daily, Jardiance 25 mg daily, Crestor 40 mg Injections: Please restart Praluent 150 mg every 14 days and Ozempic 0.25 mg weekly.   Continue amlodipine-olmesartan 10-40 mg once daily.   These medications should be in a pill pack from your pharmacy.   Monitor blood sugars at home and keep a log (glucometer or piece of paper) to bring with you to your next visit.  Keep up the good work with diet and exercise. Aim for a diet full of vegetables, fruit and lean meats (chicken, [place], fish). Try to limit salt intake by eating fresh or frozen vegetables (instead of canned), rinse canned vegetables prior to cooking and do not add any additional salt to meals.   Sensor Application If using the App, you can tap Help in the Main Menu to access an in-app tutorial on applying a Sensor. See below for instructions on how to download the app. Apply Sensors only on the back of your upper arm. If placed in other areas, the Sensor may not function properly and could give you inaccurate readings. Avoid areas with scars, moles, stretch marks, or lumps.   Select an area of skin that generally stays flat during your normal daily activities (no bending or folding). Choose a site that is at least 1 inch (2.5 cm) away from any injection sites. To prevent discomfort or skin irritation, you should select a different site other than the one most recently used. Wash application site using a plain soap, dry, and then clean with an alcohol wipe. This will help remove any oily residue that may prevent the sensor from sticking properly. Allow site to air dry before proceeding. Note: The area MUST be clean and dry, or the Sensor  may not stay on for the full wear duration specified by your Sensor insert. 4. Unscrew the cap from the Sensor Applicator and set the cap aside.  5. Place the Sensor Applicator over the prepared site and push down firmly to apply the Sensor to your body. 6. Gently pull the Sensor Applicator away from your body. The Sensor should now be attached to your skin. 7. Make sure the Sensor is secure after application. Put the cap back on the Sensor Applicator. Discard the used Engineer, civil (consulting) according to local regulations.  What If My Sensor Falls Off or What If My Sensor Isn't Working? Call [facility] Team at [phone] Available 7 days a week from 8AM-8PM EST, excluding holidays If yo have multiple sensors fall off prior to 14 days of use, contact [place] at [phone]   The App Download the [place] [facility] App in your phone's app store   Load the app and select get started now Create an account  Tap scan new sensor Follow the prompts on the screen. If your sensor does not sync, try moving your phone slowly around the sensor. Phone cases may affect scanning. This will be the only time you have to scan the sensor until you apply a new sensor in 14 days.  There will be a 60 minute start up period until the app will display your glucose reading   How To Share Your Readings With [place] Once in the  app, go to settings -> connected apps -> [employer] -> Enter Practice ID -> [id number]

## 2022-05-09 NOTE — Assessment & Plan Note (Signed)
ASCVD risk - secondary prevention in patient with diabetes. Last LDL is not at goal of <55 mg/dL (LDL=170 in February 2024). High intensity statin indicated.  -Restarted rosuvastatin 40 mg daily.  -Restarted Praluent (alirocumab) 14 mg every 14 days -Restarted Eliquis (apixaban) 5 mg BID -Restarted Plavix  (clopidogrel) 75 mg QAM

## 2022-05-09 NOTE — Progress Notes (Signed)
S:    Chief Complaint  Patient presents with   Medication Management    Diabetes   72 y.o. female who presents for diabetes evaluation, education, and management in the context of the LIBERATE Study. PMH is significant for T2DM, HTN, HLD.  Patient was last seen by Primary Care Provider, Dr. Nita Sells, on 04/09/2022.  Today, patient arrives in good spirits and presents without any assistance. Reports she has been off most medications since January. She has taken Ozempic recently, but her last dose was 2 Sunday's ago.  Patient reports Diabetes was diagnosed in 45s, when she was living in [place].  Current diabetes medications include: off all medications since January, last dose of Ozempic 2 weeks ago Current hypertension medications include: amlodipine  Current hyperlipidemia medications include: none - been off medications for a while. Previously on Praluent (alicrocumab). Has cardiology visit in April.   Patient denies to taking all medications as prescribed.   Insurance coverage: [employer]  Patient denies hypoglycemic events.  Reported home fasting blood sugars: not checking Reported 2 hour post-meal/random blood sugars: not checking.  Patient reports nocturia (nighttime urination). 2x/night Patient reports neuropathy (nerve pain). Patient denies visual changes. Gets eye injections  Patient denies self foot exams.    O:  Review of Systems  All other systems reviewed and are negative.   Physical Exam Constitutional:      Appearance: Normal appearance.  Pulmonary:     Effort: Pulmonary effort is normal.  Neurological:     Mental Status: She is alert.  Psychiatric:        Mood and Affect: Mood normal.        Behavior: Behavior normal.        Thought Content: Thought content normal.    POC A1c Today: 8.7%  Lab Results  Component Value Date   HGBA1C 8.7 (A) 05/09/2022   Vitals:   05/09/22 1643 05/09/22 1644  BP: (!) 158/69 (!) 154/67  Pulse: 75 76   SpO2: 100%     Lipid Panel     Component Value Date/Time   CHOL 280 (H) 04/09/2022 1658   TRIG 179 (H) 04/09/2022 1658   HDL 78 04/09/2022 1658   CHOLHDL 3.6 04/09/2022 1658   CHOLHDL 3.6 08/15/2020 0335   VLDL 20 08/15/2020 0335   LDLCALC 170 (H) 04/09/2022 1658    Clinical Atherosclerotic Cardiovascular Disease (ASCVD): Yes  The ASCVD Risk score (Arnett DK, et al., 2019) failed to calculate for the following reasons:   The patient has a prior MI or stroke diagnosis   A/P: LIBERATE Study:  -Patient provided verbal consent to participate in the study. Consent documented in electronic medical record.  -Provided education on Libre 3 CGM. Collaborated to ensure Elenor Legato 3 app was downloaded on patient's phone. Educated on how to place sensor every 14 days, patient placed first sensor correctly and verbalized understanding of use, removal, and how to place next sensor. Discussed alarms. 8 sensors provided for a 3 month supply. Educated to contact the office if the sensor falls off early and replacements are needed before their next [employer].  Diabetes longstanding currently above goal based on A1c. Patient is able to verbalize appropriate hypoglycemia management plan. Medication adherence denied. Control is suboptimal due to chronic non-adherence . -Restarted GLP-1 Ozempic (semaglutide) 0.25 mg weekly.  -Restarted SGLT2-I Jardiance (empagliflozin) 25 mg daily. Counseled on sick day rules. -Discontinued metformin for now to minimize pill burden.  -Restart Cymbalta (duloxetine) 60 mg daily for neuropathy.  -  Patient educated on purpose, proper use, and potential adverse effects of Ozempic, Jardiance, Cymbalta.  -Extensively discussed pathophysiology of diabetes, recommended lifestyle interventions, dietary effects on blood sugar control.  -Counseled on s/sx of and management of hypoglycemia.  -Next A1c anticipated 3 months.   ASCVD risk - secondary prevention in patient with  diabetes. Last LDL is not at goal of <55 mg/dL (LDL=170 in February 2024). High intensity statin indicated.  -Restarted rosuvastatin 40 mg daily.  -Restarted Praluent (alirocumab) 14 mg every 14 days -Restarted Eliquis (apixaban) 5 mg BID -Restarted Plavix  (clopidogrel) 75 mg QAM  Hypertension longstanding, currently uncontrolled in the setting of nonadherence. Blood pressure goal of <130/80 mmHg. Blood pressure control is suboptimal due to adherence. -Continued amlodipine-olmesartan 10-40 mg. -Restart carvedilol 3.125 mg BID  Written patient instructions provided. Patient verbalized understanding of treatment plan.  Total time in face to face counseling 40 minutes.    Follow-up:  Pharmacist 2 weeks. Patient seen with Louanne Belton PharmD PGY-1 Pharmacy Resident and Joseph Art, PharmD, PGY2 Pharmacy Resident.

## 2022-05-09 NOTE — Assessment & Plan Note (Signed)
LIBERATE Study:  -Patient provided verbal consent to participate in the study. Consent documented in electronic medical record.  -Provided education on Libre 3 CGM. Collaborated to ensure Elenor Legato 3 app was downloaded on patient's phone. Educated on how to place sensor every 14 days, patient placed first sensor correctly and verbalized understanding of use, removal, and how to place next sensor. Discussed alarms. 8 sensors provided for a 3 month supply. Educated to contact the office if the sensor falls off early and replacements are needed before their next [employer].  Diabetes longstanding currently above goal based on A1c. Patient is able to verbalize appropriate hypoglycemia management plan. Medication adherence denied. Control is suboptimal due to chronic non-adherence . -Restarted GLP-1 Ozempic (semaglutide) 0.25 mg weekly.  -Restarted SGLT2-I Jardiance (empagliflozin) 25 mg daily. Counseled on sick day rules. -Discontinued metformin for now to minimize pill burden.  -Restart Cymbalta (duloxetine) 60 mg daily for neuropathy.  -Patient educated on purpose, proper use, and potential adverse effects of Ozempic, Jardiance, Cymbalta.  -Extensively discussed pathophysiology of diabetes, recommended lifestyle interventions, dietary effects on blood sugar control.  -Counseled on s/sx of and management of hypoglycemia.  -Next A1c anticipated 3 months.

## 2022-05-09 NOTE — Assessment & Plan Note (Signed)
Hypertension longstanding, currently uncontrolled in the setting of nonadherence. Blood pressure goal of <130/80 mmHg. Blood pressure control is suboptimal due to adherence. -Continued amlodipine-olmesartan 10-40 mg. -Restart carvedilol 3.125 mg BID

## 2022-05-10 NOTE — Progress Notes (Signed)
Reviewed and agree with Dr Koval's plan.   

## 2022-05-16 ENCOUNTER — Telehealth: Payer: Self-pay

## 2022-05-16 NOTE — Telephone Encounter (Signed)
A Prior Authorization was initiated for this patients PRALUENT through CoverMyMeds.   Key: [id number]

## 2022-05-16 NOTE — Telephone Encounter (Signed)
LIBERATE Study  Patient completed first study visit for the LIBERATE CGM Study. Contacted patient to discuss CGM tolerability. Patient did not answer the phone and a HIPAA compliant VM was left requesting a return call.   Joseph Art, Pharm.D. PGY-2 Ambulatory Care Pharmacy Resident

## 2022-05-17 ENCOUNTER — Other Ambulatory Visit (HOSPITAL_COMMUNITY): Payer: Self-pay

## 2022-05-17 MED ORDER — REPATHA SURECLICK 140 MG/ML ~~LOC~~ SOAJ: 1.0000 | SUBCUTANEOUS | 11 refills | Status: DC

## 2022-05-17 NOTE — Telephone Encounter (Addendum)
No contraindications to Repatha noted. Heather [name], are you able to submit prior auth for Repatha 140mg  autoinjector - 1 injection every 14 days due to formulary change. Please let me know once approved, I can send in updated rx and let pt know of med change, thanks!

## 2022-05-17 NOTE — Telephone Encounter (Signed)
A Prior Authorization was initiated AND APPROVED for this patients REPATHA through CoverMyMeds.   Key: [id number]  Test claim: copay $11.20 for two 38ml syringes.

## 2022-05-17 NOTE — Telephone Encounter (Signed)
Prior Auth for patients medication PRALUENT denied by [facility] via CoverMyMeds.   Reason: The drug you asked for is not listed in your preferred drug list (formulary). The preferred drug(s), you may not have tried, are: ezetimibe tablet, Nexletol tablet, Nexlizet tablet, Repatha Syringe subcutaneous syringe. Your provider needs to give [place] medical reasons why the preferred drug(s) would not work for you and/or would have bad side effects. Sometimes a preferred drug needs more review for approval. Additionally, some preferred drugs listed may be the same drugs with different strengths or forms. [employer] may only require one strength or form of that drug to be tried.  Electronic appeal available: provide specific, detailed clinical information/rationale of your patient's health status to address their denial reasons.  CoverMyMeds Key: [id number]

## 2022-05-17 NOTE — Addendum Note (Signed)
Addended by: Chrishawn Boley E on: 05/17/2022 03:24 PM   Modules accepted: Orders

## 2022-05-17 NOTE — Telephone Encounter (Signed)
Called pt, left detailed message that her insurance formulary has changed coverage from [place] to [place]. New rx has been sent to pharmacy.

## 2022-05-23 ENCOUNTER — Encounter: Payer: Self-pay | Admitting: Pharmacist

## 2022-05-23 ENCOUNTER — Encounter (INDEPENDENT_AMBULATORY_CARE_PROVIDER_SITE_OTHER): Payer: Medicare PPO | Admitting: Pharmacist

## 2022-05-23 VITALS — BP 151/79 | HR 72 | Wt 193.6 lb

## 2022-05-23 DIAGNOSIS — E1165 Type 2 diabetes mellitus with hyperglycemia: Secondary | ICD-10-CM | POA: Diagnosis not present

## 2022-05-23 NOTE — Assessment & Plan Note (Signed)
Diabetes longstanding, currently uncontrolled based on A1c, but improved control per CGM report. Patient is able to verbalize appropriate hypoglycemia management plan. Medication adherence denied, patient stopped all medications >1 week ago for unclear reasons. Control is suboptimal due to chronic non-adherence. -Decreased dose of GLP-1 Ozempic (semaglutide) from 0.25 mg weekly to 15 clicks weekly. If tolerated, increase by 5 clicks per week until 0.25 mg weekly.  -Discontinued SGLT2-I Jardiance (empagliflozin) 25 mg daily. Given chronic non-adherence, will try to optimize Ozempic before adding other agents.  -Patient educated on purpose, proper use, and potential adverse effects of Ozempic.  -Extensively discussed pathophysiology of diabetes, recommended lifestyle interventions, dietary effects on blood sugar control.  -Counseled on s/sx of and management of hypoglycemia.

## 2022-05-23 NOTE — Research (Signed)
S:    Chief Complaint  Patient presents with   Medication Management    Diabetes & CGM follow-up   72 y.o. female who presents for diabetes evaluation, education, and management.  PMH is significant for T2DM, NSTEMI, HTN, HLD.  Patient was last seen by Primary Care Provider, Dr. Nita Sells, on 04/09/2022. Last seen by pharmacy clinic on 05/09/2022 where many medications were restarted, including Ozempic (semaglutide) and Jardiance (empagliflozin). Metformin was discontinued to simplify regimen.   Today, patient arrives in good spirits and presents without any assistance. She stopped all medications ~1 week ago and is unable to verbalize exactly why. She states she feels lethargic and does not want to get out of bed.   Patient reports Diabetes was diagnosed in 38s, when she was living in [place].  Current diabetes medications include:  Ozempic (semaglutide) 0.25 mg weekly (not taking), Jardiance (empagliflozin) 25 mg daily (not taking) Current hypertension medications include: amlodipine-olmesartan 10-40 mg daily, carvedilol 3.125 mg BID Current hyperlipidemia medications include: rosuvastatin 40 mg daily, Praluent (alirocumab) 14 mg every 14 days   Patient denies adherence with medications, reports she stopped all medications 1-2 weeks ago.   Insurance coverage: [employer] Medicare  Patient denies hypoglycemic events.  Patient denies nocturia (nighttime urination).  Patient reports neuropathy (nerve pain). Patient denies visual changes. Patient reports self foot exams.   Patient reported dietary habits: reports eating less when she was on Ozempic   O:  Review of Systems  All other systems reviewed and are negative.   Physical Exam Constitutional:      Appearance: Normal appearance.  Pulmonary:     Effort: Pulmonary effort is normal.  Neurological:     Mental Status: She is alert.  Psychiatric:        Mood and Affect: Mood normal.        Behavior: Behavior normal.         Thought Content: Thought content normal.     05/10/2022-05/23/2022 CGM Download:  % Time CGM is active: 96% Average Glucose: 134 mg/dL Glucose Management Indicator: 6.5%  Glucose Variability: 21.1% (goal <36%) Time in Goal:  - Time in range 70-180: 94% - Time above range: 6% - Time below range: 0%  Lab Results  Component Value Date   HGBA1C 8.7 (A) 05/09/2022   Vitals:   05/23/22 1550 05/23/22 1553  BP: (!) 158/82 (!) 151/79  Pulse: 72   SpO2: 100%     Lipid Panel     Component Value Date/Time   CHOL 280 (H) 04/09/2022 1658   TRIG 179 (H) 04/09/2022 1658   HDL 78 04/09/2022 1658   CHOLHDL 3.6 04/09/2022 1658   CHOLHDL 3.6 08/15/2020 0335   VLDL 20 08/15/2020 0335   LDLCALC 170 (H) 04/09/2022 1658    Clinical Atherosclerotic Cardiovascular Disease (ASCVD): Yes  The ASCVD Risk score (Arnett DK, et al., 2019) failed to calculate for the following reasons:   The patient has a prior MI or stroke diagnosis    A/P: Diabetes longstanding, currently uncontrolled based on A1c, but improved control per CGM report. Patient is able to verbalize appropriate hypoglycemia management plan. Medication adherence denied, patient stopped all medications >1 week ago for unclear reasons. Control is suboptimal due to chronic non-adherence. -Decreased dose of GLP-1 Ozempic (semaglutide) from 0.25 mg weekly to 15 clicks weekly. If tolerated, increase by 5 clicks per week until 0.25 mg weekly.  -Discontinued SGLT2-I Jardiance (empagliflozin) 25 mg daily. Given chronic non-adherence, will try to optimize Ozempic before  adding other agents.  -Patient educated on purpose, proper use, and potential adverse effects of Ozempic.  -Extensively discussed pathophysiology of diabetes, recommended lifestyle interventions, dietary effects on blood sugar control.  -Counseled on s/sx of and management of hypoglycemia.  -Next A1c anticipated June 2024.   Written patient instructions provided. Patient  verbalized understanding of treatment plan.  Total time in face to face counseling 30 minutes.    Follow-up:  Pharmacist 4 weeks. Patient seen with Louanne Belton PharmD PGY-1 Pharmacy Resident and Joseph Art, PharmD, PGY2 Pharmacy Resident.

## 2022-05-23 NOTE — Patient Instructions (Signed)
It was nice to see you today!  Your goal blood sugar is 80-130 before eating and less than 180 after eating.  Medication Changes: Decrease to 15 clicks weekly. If tolerated, increase by 5 clicks per week until you reach 0.25 mg weekly.   Docusate and bisacodyl can help with constipation.  Monitor blood sugars at home and keep a log (glucometer or piece of paper) to bring with you to your next visit.  Keep up the good work with diet and exercise. Aim for a diet full of vegetables, fruit and lean meats (chicken, [place], fish). Try to limit salt intake by eating fresh or frozen vegetables (instead of canned), rinse canned vegetables prior to cooking and do not add any additional salt to meals.

## 2022-05-28 NOTE — Progress Notes (Signed)
Reviewed and agree with Dr Koval's plan.   

## 2022-05-29 ENCOUNTER — Ambulatory Visit: Payer: Self-pay | Admitting: *Deleted

## 2022-05-29 NOTE — Patient Outreach (Signed)
  Care Coordination   Follow Up Visit Note   05/29/2022 Name: Heather [name] MRN: [id number] DOB: 1950/06/28  Heather [name] is a 72 y.o. year old female who sees Sharion Settler, DO for primary care. I spoke with  Heather [name] by phone today.  What matters to the patients health and wellness today?  Working a lot with the [employer] being short staffed/inneed of additional hours, and also working at [employer].    Goals Addressed             This Visit's Progress    Provide support, resources and guidance related to housing, mental health and other SDOH needs       Activities and task to complete in order to accomplish goals.   Continue working and staying busy- glad things are going well ! Follow up with housing resources discussed (may have a waiting period for availability so keep searching and continue with your plan to collect a down payment for security deposit, getting lights turned on, etc.) Continue with compliance of taking medication prescribed by Doctor Personal steps you want to take over the next few weeks ( Pay off debt/accumulate some savings for moving. Glad you enjoyed the [place] trip with your church!) Follow up on the community resources provided by care guide as well as the [place] referral placed for resources             SDOH assessments and interventions completed:  Yes  SDOH Interventions Today    Flowsheet Row Most Recent Value  SDOH Interventions   Financial Strain Interventions Intervention Not Indicated  [Pt is paying off some bills/debt and working to have some set aside for when she gets her own place.]        Care Coordination Interventions:  Yes, provided  Interventions Today    Flowsheet Row Most Recent Value  Chronic Disease   Chronic disease during today's visit Hypertension (HTN)  General Interventions   General Interventions Discussed/Reviewed Community Resources  Mental Health Interventions   Mental Health  Discussed/Reviewed Mental Health Discussed  [Pt in good spirits- happy, working and positive connections]       Follow up plan: Follow up call scheduled for 06/28/22    Encounter Outcome:  Pt. Visit Completed

## 2022-05-29 NOTE — Patient Instructions (Signed)
Visit Information  Thank you for taking time to visit with me today. Please don't hesitate to contact me if I can be of assistance to you.   Following are the goals we discussed today:   Goals Addressed             This Visit's Progress    Provide support, resources and guidance related to housing, mental health and other SDOH needs       Activities and task to complete in order to accomplish goals.   Continue working and staying busy- glad things are going well ! Follow up with housing resources discussed (may have a waiting period for availability so keep searching and continue with your plan to collect a down payment for security deposit, getting lights turned on, etc.) Continue with compliance of taking medication prescribed by Doctor Personal steps you want to take over the next few weeks ( Pay off debt/accumulate some savings for moving. Glad you enjoyed the [place] trip with your church!) Follow up on the community resources provided by care guide as well as the [place] referral placed for resources             Our next appointment is by telephone on 06/28/22    Please call the care guide team at [phone] if you need to cancel or reschedule your appointment.   If you are experiencing a Mental Health or [place] or need someone to talk to, please call the Suicide and Crisis Lifeline: 988 call 911   The patient verbalized understanding of instructions, educational materials, and care plan provided today and DECLINED offer to receive copy of patient instructions, educational materials, and care plan.   Telephone follow up appointment with care management team member scheduled for:06/28/22 Eduard Clos, MSW, [place] Worker [employer] [phone]

## 2022-06-05 DIAGNOSIS — H43822 Vitreomacular adhesion, left eye: Secondary | ICD-10-CM | POA: Diagnosis not present

## 2022-06-05 DIAGNOSIS — H31093 Other chorioretinal scars, bilateral: Secondary | ICD-10-CM | POA: Diagnosis not present

## 2022-06-05 DIAGNOSIS — E113513 Type 2 diabetes mellitus with proliferative diabetic retinopathy with macular edema, bilateral: Secondary | ICD-10-CM | POA: Diagnosis not present

## 2022-06-10 ENCOUNTER — Encounter (HOSPITAL_COMMUNITY): Payer: Self-pay

## 2022-06-10 ENCOUNTER — Other Ambulatory Visit: Payer: Self-pay

## 2022-06-10 ENCOUNTER — Emergency Department (HOSPITAL_COMMUNITY)
Admission: EM | Admit: 2022-06-10 | Discharge: 2022-06-11 | Disposition: A | Discharge status: Home / Self Care | Payer: Medicare PPO | Attending: Emergency Medicine | Admitting: Emergency Medicine

## 2022-06-10 DIAGNOSIS — R519 Headache, unspecified: Secondary | ICD-10-CM | POA: Diagnosis not present

## 2022-06-10 DIAGNOSIS — I10 Essential (primary) hypertension: Secondary | ICD-10-CM

## 2022-06-10 LAB — CBC WITH DIFFERENTIAL/PLATELET
Abs Immature Granulocytes: 0.02 10*3/uL (ref 0.00–0.07)
Basophils Absolute: 0 10*3/uL (ref 0.0–0.1)
Basophils Relative: 1 %
Eosinophils Absolute: 0.2 10*3/uL (ref 0.0–0.5)
Eosinophils Relative: 3 %
HCT: 33.9 % — ABNORMAL LOW (ref 36.0–46.0)
Hemoglobin: 11 g/dL — ABNORMAL LOW (ref 12.0–15.0)
Immature Granulocytes: 0 %
Lymphocytes Relative: 32 %
Lymphs Abs: 2.5 10*3/uL (ref 0.7–4.0)
MCH: 27.9 pg (ref 26.0–34.0)
MCHC: 32.4 g/dL (ref 30.0–36.0)
MCV: 86 fL (ref 80.0–100.0)
Monocytes Absolute: 0.6 10*3/uL (ref 0.1–1.0)
Monocytes Relative: 7 %
Neutro Abs: 4.4 10*3/uL (ref 1.7–7.7)
Neutrophils Relative %: 57 %
Platelets: 253 10*3/uL (ref 150–400)
RBC: 3.94 MIL/uL (ref 3.87–5.11)
RDW: 15.4 % (ref 11.5–15.5)
WBC: 7.7 10*3/uL (ref 4.0–10.5)
nRBC: 0 % (ref 0.0–0.2)

## 2022-06-10 LAB — BASIC METABOLIC PANEL
Anion gap: 11 (ref 5–15)
BUN: 27 mg/dL — ABNORMAL HIGH (ref 8–23)
CO2: 20 mmol/L — ABNORMAL LOW (ref 22–32)
Calcium: 8.8 mg/dL — ABNORMAL LOW (ref 8.9–10.3)
Chloride: 109 mmol/L (ref 98–111)
Creatinine, Ser: 1.47 mg/dL — ABNORMAL HIGH (ref 0.44–1.00)
GFR, Estimated: 38 mL/min — ABNORMAL LOW (ref >60)
Glucose, Bld: 114 mg/dL — ABNORMAL HIGH (ref 70–99)
Potassium: 3.6 mmol/L (ref 3.5–5.1)
Sodium: 140 mmol/L (ref 135–145)

## 2022-06-10 LAB — URINALYSIS, ROUTINE W REFLEX MICROSCOPIC
Bilirubin Urine: NEGATIVE
Glucose, UA: NEGATIVE mg/dL
Hgb urine dipstick: NEGATIVE
Ketones, ur: NEGATIVE mg/dL
Nitrite: NEGATIVE
Protein, ur: >=300 mg/dL — AB
Specific Gravity, Urine: 1.017 (ref 1.005–1.030)
pH: 5 (ref 5.0–8.0)

## 2022-06-10 NOTE — ED Provider Triage Note (Signed)
Emergency Medicine Provider Triage Evaluation Note  Heather [name] , a 72 y.o. female  was evaluated in triage.  Pt complains of intermittent left temporal headache she describes as throbbing since yesterday.  She denies associated visual changes, nausea, vomiting, chest pain, shortness of breath.  She denies a history of these headaches.  She states that she has not been taking her antihypertensives for the last 3 weeks.  Review of Systems  Positive: See HPI Negative: See HPI  Physical Exam  BP (!) 216/112 (BP Location: Right Arm)   Pulse 98   Temp 98 F (36.7 C)   Resp 17   Ht 5\' 7"  (1.702 m)   Wt 87.8 kg   SpO2 98%   BMI 30.32 kg/m  Gen:   Awake, no distress   Resp:  Normal effort lungs to auscultation MSK:   Moves extremities without difficulty  Other:  Alert and oriented, cranial nerves intact, 5/5 strength to bilateral upper and lower extremities, abdomen soft and nontender, regular rate and rhythm, PERRL, EOMI, no tenderness over temporal arteries  Medical Decision Making  Medically screening exam initiated at 9:27 PM.  Appropriate orders placed.  Heather [name] was informed that the remainder of the evaluation will be completed by another provider, this initial triage assessment does not replace that evaluation, and the importance of remaining in the ED until their evaluation is complete.     Richardson Dopp 06/10/22 2128

## 2022-06-10 NOTE — ED Triage Notes (Signed)
Pt is getting sharp 6/10 pains to area around left temple. Pain comes and goes. No blurry vision. No other symptoms. This has been happening since yesterday and nothing makes it better or worse. No previous falls or LOC

## 2022-06-10 NOTE — ED Triage Notes (Signed)
PT has not taken blood pressure medication that she is prescribed.

## 2022-06-11 ENCOUNTER — Telehealth: Payer: Self-pay

## 2022-06-11 ENCOUNTER — Emergency Department (HOSPITAL_COMMUNITY): Payer: Medicare PPO

## 2022-06-11 DIAGNOSIS — R519 Headache, unspecified: Secondary | ICD-10-CM | POA: Diagnosis not present

## 2022-06-11 LAB — SEDIMENTATION RATE: Sed Rate: 49 mm/hr — ABNORMAL HIGH (ref 0–22)

## 2022-06-11 MED ORDER — IRBESARTAN 300 MG PO TABS
300.0000 mg | ORAL_TABLET | ORAL | Status: AC
  Administered 2022-06-11: 300 mg via ORAL
  Filled 2022-06-11: qty 1

## 2022-06-11 MED ORDER — LABETALOL HCL 5 MG/ML IV SOLN
10.0000 mg | Freq: Once | INTRAVENOUS | Status: AC
  Administered 2022-06-11: 10 mg via INTRAVENOUS
  Filled 2022-06-11: qty 4

## 2022-06-11 MED ORDER — CARVEDILOL 3.125 MG PO TABS
3.1250 mg | ORAL_TABLET | Freq: Two times a day (BID) | ORAL | Status: DC
  Administered 2022-06-11: 3.125 mg via ORAL
  Filled 2022-06-11: qty 1

## 2022-06-11 MED ORDER — ACETAMINOPHEN 325 MG PO TABS
650.0000 mg | ORAL_TABLET | Freq: Once | ORAL | Status: AC
  Administered 2022-06-11: 650 mg via ORAL
  Filled 2022-06-11: qty 2

## 2022-06-11 MED ORDER — AMLODIPINE BESYLATE 5 MG PO TABS
10.0000 mg | ORAL_TABLET | Freq: Once | ORAL | Status: AC
  Administered 2022-06-11: 10 mg via ORAL
  Filled 2022-06-11: qty 2

## 2022-06-11 NOTE — ED Notes (Signed)
Pt transported to MRI 

## 2022-06-11 NOTE — Discharge Instructions (Signed)
Return for any problem.  ?

## 2022-06-11 NOTE — Telephone Encounter (Signed)
Call to resolve issue with new sensor placement.   Patient encouraged to replace CGM sensor and swipe to activate.   Patient reports doing well. Thanked me for the follow-up.

## 2022-06-11 NOTE — Telephone Encounter (Signed)
Patient calls nurse line in regards to [facility].   She reports she was in the ED yesterday and the sensor was removed due to imaging. She assumes she just places a new one back on. She does report the one removed was not due to come off for ~ 10 days. She does have another sensor on hand.   Will forward to pharmacy team for advisement.

## 2022-06-11 NOTE — ED Notes (Signed)
Pt ambulated to the restroom and back to room with steady gait and no reported difficulties.

## 2022-06-11 NOTE — ED Provider Notes (Signed)
[place] EMERGENCY DEPARTMENT AT [facility] Provider Note   CSN: [id number] Arrival date & time: 06/10/22  2059     History  Chief Complaint  Patient presents with   Headache    Heather [name] is a 72 y.o. female.  Patient presents to the emergency department for evaluation of headache.  Patient reports a throbbing headache on the left side of her head, near the temple.  Symptoms intermittent through the day.  Patient with a history of high blood pressure, has not been taking her medications for several weeks.  Denies visual disturbance, jaw claudication.       Home Medications Prior to Admission medications   Medication Sig Start Date End Date Taking? Authorizing Provider  amLODipine-olmesartan (AZOR) 10-40 MG tablet Take 1 tablet by mouth daily. Patient not taking: Reported on 05/23/2022 03/29/22   Sabino Dick, DO  apixaban (ELIQUIS) 5 MG TABS tablet Take 1 tablet (5 mg total) by mouth 2 (two) times daily. Patient not taking: Reported on 05/09/2022 05/09/22   McDiarmid, Leighton Roach, MD  blood glucose meter kit and supplies KIT Dispense based on patient and insurance preference. Use up to four times daily as directed. Patient not taking: Reported on 09/12/2021 04/26/20   Leeroy Bock, MD  carvedilol (COREG) 3.125 MG tablet Take 1 tablet (3.125 mg total) by mouth 2 (two) times daily with a meal. Patient not taking: Reported on 05/09/2022 05/09/22   McDiarmid, Leighton Roach, MD  clopidogrel (PLAVIX) 75 MG tablet TAKE 1 TABLET BY MOUTH EVERY DAY WITH BREAKFAST Patient not taking: Reported on 05/09/2022 05/09/22   McDiarmid, Leighton Roach, MD  diclofenac Sodium (VOLTAREN) 1 % GEL APPLY 2 TO 4 GRAMS TOPICALLY 4 (FOUR) TIMES DAILY. Patient not taking: Reported on 09/12/2021 08/09/21   Sabino Dick, DO  DULoxetine (CYMBALTA) 60 MG capsule Take 1 capsule (60 mg total) by mouth daily. Patient not taking: Reported on 05/09/2022 05/09/22   McDiarmid, Leighton Roach, MD  empagliflozin (JARDIANCE)  25 MG TABS tablet Take 1 tablet (25 mg total) by mouth daily. Patient not taking: Reported on 05/09/2022 05/09/22   McDiarmid, Leighton Roach, MD  Evolocumab (REPATHA SURECLICK) 140 MG/ML SOAJ Inject 140 mg into the skin every 14 (fourteen) days. Patient not taking: Reported on 05/23/2022 05/17/22   Corky Crafts, MD  Lancets [facility] DELICA PLUS [place]) MISC USE AS DIRECTED to check sugars once daily Patient not taking: Reported on 09/12/2021 01/29/21   Carney Living, MD  Needles & Syringes MISC 1 Container by Does not apply route daily. Patient not taking: Reported on 05/09/2022 03/29/22   Sabino Dick, DO  nitroGLYCERIN (NITROSTAT) 0.4 MG SL tablet Place 1 tablet (0.4 mg total) under the tongue every 5 (five) minutes as needed for chest pain. Patient not taking: Reported on 09/12/2021 07/12/21   Moses Manners, MD  prednisoLONE acetate (PRED FORTE) 1 % ophthalmic suspension Place 1 drop into the right eye 4 (four) times daily. Patient not taking: Reported on 09/12/2021 06/19/21   [provider]  rosuvastatin (CRESTOR) 40 MG tablet Take 1 tablet (40 mg total) by mouth daily. Patient not taking: Reported on 05/09/2022 05/09/22   McDiarmid, Leighton Roach, MD  Semaglutide,0.25 or 0.5MG /DOS, 2 MG/1.5ML SOPN Inject 0.25 mg into the skin once a week. 0.25 mg once weekly for 4 weeks then increase to 0.5 mg weekly for at least 4 weeks,max 1 mg Patient not taking: Reported on 05/09/2022 05/09/22   McDiarmid, Leighton Roach, MD  Allergies    Patient has no known allergies.    Review of Systems   Review of Systems  Physical Exam Updated Vital Signs BP (!) 205/87   Pulse (!) 57   Temp (!) 97.4 F (36.3 C) (Oral)   Resp 19   Ht 5\' 7"  (1.702 m)   Wt 87.8 kg   SpO2 100%   BMI 30.32 kg/m  Physical Exam Vitals and nursing note reviewed.  Constitutional:      General: She is not in acute distress.    Appearance: She is well-developed.  HENT:     Head: Normocephalic and atraumatic.      Mouth/Throat:     Mouth: Mucous membranes are moist.  Eyes:     General: Vision grossly intact. Gaze aligned appropriately.     Extraocular Movements: Extraocular movements intact.     Conjunctiva/sclera: Conjunctivae normal.  Cardiovascular:     Rate and Rhythm: Normal rate and regular rhythm.     Pulses: Normal pulses.     Heart sounds: Normal heart sounds, S1 normal and S2 normal. No murmur heard.    No friction rub. No gallop.  Pulmonary:     Effort: Pulmonary effort is normal. No respiratory distress.     Breath sounds: Normal breath sounds.  Abdominal:     General: Bowel sounds are normal.     Palpations: Abdomen is soft.     Tenderness: There is no abdominal tenderness. There is no guarding or rebound.     Hernia: No hernia is present.  Musculoskeletal:        General: No swelling.     Cervical back: Full passive range of motion without pain, normal range of motion and neck supple. No spinous process tenderness or muscular tenderness. Normal range of motion.     Right lower leg: No edema.     Left lower leg: No edema.  Skin:    General: Skin is warm and dry.     Capillary Refill: Capillary refill takes less than 2 seconds.     Findings: No ecchymosis, erythema, rash or wound.  Neurological:     General: No focal deficit present.     Mental Status: She is alert and oriented to person, place, and time.     GCS: GCS eye subscore is 4. GCS verbal subscore is 5. GCS motor subscore is 6.     Cranial Nerves: Cranial nerves 2-12 are intact.     Sensory: Sensation is intact.     Motor: Motor function is intact.     Coordination: Coordination is intact.  Psychiatric:        Attention and Perception: Attention normal.        Mood and Affect: Mood normal.        Speech: Speech normal.        Behavior: Behavior normal.     ED Results / Procedures / Treatments   Labs (all labs ordered are listed, but only abnormal results are displayed) Labs Reviewed  CBC WITH  DIFFERENTIAL/PLATELET - Abnormal; Notable for the following components:      Result Value   Hemoglobin 11.0 (*)    HCT 33.9 (*)    All other components within normal limits  BASIC METABOLIC PANEL - Abnormal; Notable for the following components:   CO2 20 (*)    Glucose, Bld 114 (*)    BUN 27 (*)    Creatinine, Ser 1.47 (*)    Calcium 8.8 (*)    GFR,  Estimated 38 (*)    All other components within normal limits  URINALYSIS, ROUTINE W REFLEX MICROSCOPIC - Abnormal; Notable for the following components:   APPearance CLOUDY (*)    Protein, ur >=300 (*)    Leukocytes,Ua SMALL (*)    Bacteria, UA MANY (*)    All other components within normal limits  SEDIMENTATION RATE - Abnormal; Notable for the following components:   Sed Rate 49 (*)    All other components within normal limits    EKG EKG Interpretation  Date/Time:  Monday June 10 2022 21:44:27 EDT Ventricular Rate:  66 PR Interval:  132 QRS Duration: 72 QT Interval:  394 QTC Calculation: 413 R Axis:   7 Text Interpretation: Normal sinus rhythm Left ventricular hypertrophy with repolarization abnormality ( R in aVL , Romhilt-Estes ) Possible Inferior infarct , age undetermined Abnormal ECG When compared with ECG of 16-Aug-2020 05:05, PREVIOUS ECG IS PRESENT Confirmed by Gilda Crease [phone]) on 06/11/2022 6:09:46 AM  Radiology CT HEAD WO CONTRAST ( )  Result Date: 06/11/2022 CLINICAL DATA:  72 year old female with new onset headache. Intermittent left temple pain. Symptom onset yesterday. EXAM: CT HEAD WITHOUT CONTRAST TECHNIQUE: Contiguous axial images were obtained from the base of the skull through the vertex without intravenous contrast. RADIATION DOSE REDUCTION: This exam was performed according to the departmental dose-optimization program which includes automated exposure control, adjustment of the mA and/or kV according to patient size and/or use of iterative reconstruction technique. COMPARISON:  None Available.  FINDINGS: Brain: Cerebral volume is within normal limits for age. No midline shift, ventriculomegaly, mass effect, evidence of mass lesion, intracranial hemorrhage or evidence of cortically based acute infarction. Incidental midline dural calcification along the falx. Patchy mild to moderate scattered bilateral cerebral white matter hypodensity. No cortical encephalomalacia identified. Deep gray nuclei and posterior fossa appear within normal limits. Vascular: Calcified atherosclerosis at the skull base. No suspicious intracranial vascular hyperdensity. Skull: No acute osseous abnormality identified. Sinuses/Orbits: Trace paranasal sinus mucosal thickening and posterior left ethmoid bubbly opacity. Tympanic cavities and mastoids appear clear. No visible sinus fluid level. Other: Postoperative changes to both globes. Wig artifact. No acute orbit or scalp soft tissue finding. IMPRESSION: 1. No acute intracranial abnormality. 2. Up to moderate for age nonspecific cerebral white matter changes, most commonly due to chronic small vessel disease. 3. Minimal paranasal sinus inflammation, significance doubtful. Electronically Signed   By: [place] Fleming M.D.   On: 06/11/2022 06:05    Procedures Procedures    Medications Ordered in ED Medications  amLODipine (NORVASC) tablet 10 mg (has no administration in time range)  irbesartan (AVAPRO) tablet 300 mg (has no administration in time range)  carvedilol (COREG) tablet 3.125 mg (has no administration in time range)  labetalol (NORMODYNE) injection 10 mg (has no administration in time range)  acetaminophen (TYLENOL) tablet 650 mg (650 mg Oral Given 06/11/22 0356)    ED Course/ Medical Decision Making/ A&P                             Medical Decision Making Amount and/or Complexity of Data Reviewed Labs: ordered. Radiology: ordered.  Risk OTC drugs. Prescription drug management.   Patient presents to the emergency department for evaluation of headache.   Patient reports that she does have a history of high blood pressure and has been off of all of her medications for several weeks.  She has had an intermittent left-sided throbbing headache throughout the  day.  Differential diagnosis considered includes, but not limited to: Hypertensive urgency/press; intracranial hemorrhage; migraine headache; temporal arteritis  Patient denies visual disturbance and jaw claudication.  No abnormality palpated over temporal artery area.  Sed rate is 49.  Discussed with Dr. Wilford Corner, feels that with her age being 3, this number is not high enough to suggest temporal arteritis.  Patient's blood pressure did temporarily come down but then go back up.  Will administer home meds plus labetalol to get her blood pressure improved.  Dr. Wilford Corner to commence performing MRI to rule out other structural abnormalities causing this headache.  Will be signed to oncoming ER physician.  If MRI is normal and blood pressure control is achieved, can discharge with outpatient follow-up.        Final Clinical Impression(s) / ED Diagnoses Final diagnoses:  Primary hypertension  Bad headache    Rx / DC Orders ED Discharge Orders     None         Darcey Demma, Canary Brim, MD 06/11/22 [phone]

## 2022-06-11 NOTE — ED Provider Notes (Signed)
Patient seen after prior EDP.  MRI imaging is without acute abnormality.  Patient is feeling significantly improved.  Her blood pressure is improved.  She desires discharge home.  Patient does understand need for close outpatient follow-up.  Strict return precautions given and understood.     Wynetta Fines, MD 06/11/22 1007

## 2022-06-12 NOTE — Telephone Encounter (Signed)
Reviewed and agree with Dr Koval's plan.   

## 2022-06-14 ENCOUNTER — Telehealth: Payer: Self-pay

## 2022-06-14 ENCOUNTER — Ambulatory Visit: Payer: Medicare PPO

## 2022-06-14 NOTE — Telephone Encounter (Signed)
        Patient  visited [facility] on 4/16  Telephone encounter attempt :  2nd  A HIPAA compliant voice message was left requesting a return call.  Instructed patient to call back .    Joreen Swearingin [facility] Guide, [place] [phone] [address], [place], [place] [address] Phone: [phone] Email: [email or web address]       

## 2022-06-14 NOTE — Telephone Encounter (Signed)
        Patient  visited [place] on 4/19     Telephone encounter attempt :  1st  A HIPAA compliant voice message was left requesting a return call.  Instructed patient to call back    Lenard Forth [facility] Guide, [facility] [phone] [address] [place], [place], [place] [address] Phone: [phone] Email: [email or web address]

## 2022-06-25 ENCOUNTER — Ambulatory Visit: Payer: Medicare PPO | Admitting: Pharmacist

## 2022-06-26 ENCOUNTER — Other Ambulatory Visit: Payer: Self-pay | Admitting: Family Medicine

## 2022-06-26 ENCOUNTER — Ambulatory Visit: Payer: Medicare PPO | Attending: Cardiovascular Disease | Admitting: Pharmacist

## 2022-06-26 DIAGNOSIS — E1165 Type 2 diabetes mellitus with hyperglycemia: Secondary | ICD-10-CM | POA: Diagnosis not present

## 2022-06-26 DIAGNOSIS — I10 Essential (primary) hypertension: Secondary | ICD-10-CM | POA: Diagnosis not present

## 2022-06-26 DIAGNOSIS — I214 Non-ST elevation (NSTEMI) myocardial infarction: Secondary | ICD-10-CM | POA: Diagnosis not present

## 2022-06-26 DIAGNOSIS — I480 Paroxysmal atrial fibrillation: Secondary | ICD-10-CM | POA: Diagnosis not present

## 2022-06-26 DIAGNOSIS — E782 Mixed hyperlipidemia: Secondary | ICD-10-CM | POA: Diagnosis not present

## 2022-06-26 MED ORDER — ROSUVASTATIN CALCIUM 40 MG PO TABS
40.0000 mg | ORAL_TABLET | Freq: Every day | ORAL | 3 refills | Status: DC
Start: 2022-06-26 — End: 2022-09-19

## 2022-06-26 MED ORDER — AMLODIPINE-OLMESARTAN 10-40 MG PO TABS
ORAL_TABLET | ORAL | 5 refills | Status: DC
Start: 2022-06-26 — End: 2022-08-22

## 2022-06-26 MED ORDER — APIXABAN 5 MG PO TABS
5.0000 mg | ORAL_TABLET | Freq: Two times a day (BID) | ORAL | 5 refills | Status: DC
Start: 2022-06-26 — End: 2022-08-22

## 2022-06-26 MED ORDER — EMPAGLIFLOZIN 25 MG PO TABS
25.0000 mg | ORAL_TABLET | Freq: Every day | ORAL | 5 refills | Status: DC
Start: 2022-06-26 — End: 2023-01-28

## 2022-06-26 MED ORDER — REPATHA SURECLICK 140 MG/ML ~~LOC~~ SOAJ: 1.0000 | SUBCUTANEOUS | 11 refills | Status: DC

## 2022-06-26 MED ORDER — CLOPIDOGREL BISULFATE 75 MG PO TABS
ORAL_TABLET | ORAL | 5 refills | Status: DC
Start: 2022-06-26 — End: 2023-01-28

## 2022-06-26 NOTE — Patient Instructions (Signed)
Remove the duloxetine (Cymbalta) - green/blue capsule in your pill pack for the next few days. Take all of your other medications in the pill pack as prescribed. See if you tolerate this ok or if you are still feeling really fatigued   I sent in refills on your other medications to your pharmacy  Repatha is the twice monthly cholesterol injection that will replace your Praluent  I'll call you on Monday to see how you're feeling. If you're feeling better off of the duloxetine, I'll send a message to your primary care team to see if there's a different medication they can prescribe instead

## 2022-06-26 NOTE — Progress Notes (Unsigned)
Patient ID: Heather [name]                 DOB: [birth year]                      MRN: [id number]     HPI: Heather [name] is a 72 y.o. female referred by Dr. Eldridge Dace to HTN clinic. 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 Previously seen in lipid clinic for LDL of 195. Improved to 54 on atorvastatin 80mg  daily and Praluent 150mg  Q2W. Last seen by Dr Eldridge Dace 01/14/22, had stopped taking all meds in September due to fatigue, "didn't feel like doing anything." Fatigue improved notably when off of her meds. BP was 170/82 at that visit. She was restarted on Eliquis, olmesartan, and metformin and referred to PharmD for follow up.   I saw pt on 01/22/22 where BP was elevated at 182/92. Was dealing with a lot of stress - current housing did not have heat and her oven had broken over Thanksgiving. I resumed her amlodipine, Praluent, replaced atorvastatin with rosuvastatin in case it had been contributing to fatigue, restarted Jardiance, and Plavix. Had remained off her duloxetine and carvedilol as they were most likely med culprits for causing he fatigue. Has had a few PharmD f/u visits be canceled since then. She did present to [facility] on 3/14 where she again reported being off of all of her medications since January for unclear reason. Her BP was 154/67 at the time. She was restarted on Ozempic, Jardiance, duloxetine, rosuvastatin, Praluent, Eliquis, Plavix, and carvedilol, and stayed off metformin to help decrease pill burden.  Pt presents today for follow up. Generally nonadherent with her meds. Using a pill pack that contains rosuvastatin, Jardiance, duloxetine, Plavix, carvedilol, and Eliquis. Taking these intermittently, reports she took them yesterday and felt poorly today. Main symptom continues to be fatigue, felt wiped of energy today. Also vomited earlier today but she thinks this is due to her Ozempic as  she's experienced that in the past. Micah Flesher to the ER 2 weeks ago with a pounding headache, BP on recheck was 205/87, MRI was normal. Her BP med isn't in her pill pack. Never started on Repatha (insurance changed formulary this year and no longer covers Praluent). On her feet all day working 2 jobs. Generally doesn't like taking meds, thinks she gets in her head so if she feels poorly she stops all of her medications. Have discussed the importance of medication adherence.  Family History: Alcohol abuse in her father and mother; CAD in her maternal grandmother; Diabetes in her father; Stroke in her mother.    Social History: The patient  reports that she has never smoked. She has never used smokeless tobacco. She reports that she does not drink alcohol and does not use drugs.    Diet: watches her salt  Exercise: not active lately  Home BP readings: needs new battery for her cuff  Wt Readings from Last 3 Encounters:  06/10/22 193 lb 9 oz (87.8 kg)  05/23/22 193 lb 9.6 oz (87.8 kg)  04/09/22 200 lb (90.7 kg)   BP Readings from Last 3 Encounters:  06/11/22 (!) 165/76  05/23/22 (!) 151/79  05/09/22 (!) 154/67   Pulse Readings from Last 3 Encounters:  06/11/22 (!) 56  05/23/22 72  05/09/22 76    Renal function: CrCl cannot be calculated (Unknown ideal  weight.).  Past Medical History:  Diagnosis Date   Abscess, gluteal, right 05/25/2016   Depression    Diabetes mellitus    Encounter for hepatitis C screening test for low risk patient 04/13/2018   Screening 04/13/2018   Food insecurity 04/13/2018   Glaucoma    Hyperlipidemia    Hypertension    Medicare annual wellness visit, initial 04/13/2018   Patient is able to have AWV initial   Myocardial infarction [facility])    Neuropathy    Osteoporosis    Other chest pain 01/01/2016   Screening for breast cancer 04/13/2018   Sepsis (HCC) 05/25/2016   Tibial plateau fracture, left 02/16/2015   Uncontrolled diabetes mellitus with diabetic  neuropathy, with long-term current use of insulin 01/01/2016    Current Outpatient Medications on File Prior to Visit  Medication Sig Dispense Refill   amLODipine-olmesartan (AZOR) 10-40 MG tablet Take 1 tablet by mouth daily. (Patient not taking: Reported on 05/23/2022) 30 tablet 1   apixaban (ELIQUIS) 5 MG TABS tablet Take 1 tablet (5 mg total) by mouth 2 (two) times daily. (Patient not taking: Reported on 05/09/2022) 60 tablet 5   blood glucose meter kit and supplies KIT Dispense based on patient and insurance preference. Use up to four times daily as directed. (Patient not taking: Reported on 09/12/2021) 1 each 0   carvedilol (COREG) 3.125 MG tablet Take 1 tablet (3.125 mg total) by mouth 2 (two) times daily with a meal. (Patient not taking: Reported on 05/09/2022) 60 tablet 5   clopidogrel (PLAVIX) 75 MG tablet TAKE 1 TABLET BY MOUTH EVERY DAY WITH BREAKFAST (Patient not taking: Reported on 05/09/2022) 30 tablet 5   diclofenac Sodium (VOLTAREN) 1 % GEL APPLY 2 TO 4 GRAMS TOPICALLY 4 (FOUR) TIMES DAILY. (Patient not taking: Reported on 09/12/2021) 100 g 1   DULoxetine (CYMBALTA) 60 MG capsule Take 1 capsule (60 mg total) by mouth daily. (Patient not taking: Reported on 05/09/2022) 30 capsule 5   empagliflozin (JARDIANCE) 25 MG TABS tablet Take 1 tablet (25 mg total) by mouth daily. (Patient not taking: Reported on 05/09/2022) 30 tablet 5   Evolocumab (REPATHA SURECLICK) 140 MG/ML SOAJ Inject 140 mg into the skin every 14 (fourteen) days. (Patient not taking: Reported on 05/23/2022) 2 mL 11   Lancets (ONETOUCH DELICA PLUS LANCET33G) MISC USE AS DIRECTED to check sugars once daily (Patient not taking: Reported on 09/12/2021) 100 each 3   Needles & Syringes MISC 1 Container by Does not apply route daily. (Patient not taking: Reported on 05/09/2022) 90 each 2   nitroGLYCERIN (NITROSTAT) 0.4 MG SL tablet Place 1 tablet (0.4 mg total) under the tongue every 5 (five) minutes as needed for chest pain. (Patient not  taking: Reported on 09/12/2021) 25 tablet 2   prednisoLONE acetate (PRED FORTE) 1 % ophthalmic suspension Place 1 drop into the right eye 4 (four) times daily. (Patient not taking: Reported on 09/12/2021)     rosuvastatin (CRESTOR) 40 MG tablet Take 1 tablet (40 mg total) by mouth daily. (Patient not taking: Reported on 05/09/2022) 90 tablet 3   Semaglutide,0.25 or 0.5MG /DOS, 2 MG/1.5ML SOPN Inject 0.25 mg into the skin once a week. 0.25 mg once weekly for 4 weeks then increase to 0.5 mg weekly for at least 4 weeks,max 1 mg (Patient not taking: Reported on 05/09/2022) 1.5 mL 3   No current facility-administered medications on file prior to visit.    No Known Allergies   Assessment/Plan:  Medication nonadherence - Ongoing issue  with pt stopping all of her medications when she feels poorly. Main symptom continues to be fatigue. Has a few of her meds in a pill pack but isn't taking them consistently. Went to the ER a few weeks ago with severe HTN, her BP med isn't in her pill pack at all. Reports she took meds yesterday and felt fatigued today. Had a similar discussion with her as I did at last visit in December. Her Cymbalta is most likely to be contributing to her fatigue. Again advised her to take her other medications as prescribed except for Cymbalta and see if she is still fatigued. If so, will send message to PCP for alternative med for her peripheral neuropathy. If not, carvedilol would be the next likely culprit causing fatigue. I will call her on Monday to follow up with symptoms.  Ricci Dirocco E. Arend Bahl, PharmD, BCACP, CPP [facility] [address] [place], [place] [address] Phone: [phone]; Fax: [phone] 06/26/2022 9:14 AM

## 2022-06-28 ENCOUNTER — Ambulatory Visit: Payer: Self-pay | Admitting: *Deleted

## 2022-06-28 NOTE — Patient Instructions (Signed)
Visit Information  Thank you for taking time to visit with me today. Please don't hesitate to contact me if I can be of assistance to you.   Following are the goals we discussed today:   Goals Addressed             This Visit's Progress    Provide support, resources and guidance related to housing, mental health and other SDOH needs       Activities and task to complete in order to accomplish goals.   Continue working and staying busy- glad things are going well ! Follow up with housing resources discussed (may have a waiting period for availability so keep searching and continue with your plan to collect a down payment for security deposit, getting lights turned on, etc.) Continue with compliance of taking medication prescribed by Doctor Personal steps you want to take over the next few weeks ( Pay off debt/accumulate some savings for moving) Follow up on the community resources provided by care guide as well as the [place] referral placed for resources             Our next appointment is by telephone on 07/29/22   Please call the care guide team at [phone] if you need to cancel or reschedule your appointment.   If you are experiencing a Mental Health or Behavioral Health Crisis or need someone to talk to, please call the Suicide and Crisis Lifeline: 988 call 911   The patient verbalized understanding of instructions, educational materials, and care plan provided today and DECLINED offer to receive copy of patient instructions, educational materials, and care plan.   Telephone follow up appointment with care management team member scheduled for:07/29/22  Reece Levy, MSW, LCSW Clinical Social Worker [employer] [employer] [phone]

## 2022-06-28 NOTE — Patient Outreach (Signed)
  Care Coordination   Follow Up Visit Note   06/28/2022 Name: Heather [name] MRN: [id number] DOB: [birth year]  Heather [name] is a 72 y.o. year old female who sees Sabino Dick, DO for primary care. I spoke with  Heather [name] by phone today.  What matters to the patients health and wellness today?  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    Goals Addressed             This Visit's Progress    Provide support, resources and guidance related to housing, mental health and other SDOH needs       Activities and task to complete in order to accomplish goals.   Continue working and staying busy- glad things are going well ! Follow up with housing resources discussed (may have a waiting period for availability so keep searching and continue with your plan to collect a down payment for security deposit, getting lights turned on, etc.) Continue with compliance of taking medication prescribed by Doctor Personal steps you want to take over the next few weeks ( Pay off debt/accumulate some savings for moving) Follow up on the community resources provided by care guide as well as the NC360 referral placed for resources             SDOH assessments and interventions completed:  Yes     Care Coordination Interventions:  Yes, provided  Interventions Today    Flowsheet Row Most Recent Value  Chronic Disease   Chronic disease during today's visit Other  [Depression/housing issues]  General Interventions   General Interventions Discussed/Reviewed Community Resources  Mental Health Interventions   Mental Health Discussed/Reviewed Other, Anxiety, Mental Health Discussed, Depression  [Pt is enjoying her work at [employer] as well as [employer]- saving up money to ensure she has a nest egg for deposits, etc]       Follow up plan: Follow up call scheduled for 07/29/22    Encounter Outcome:  Pt. Visit Completed

## 2022-07-01 ENCOUNTER — Telehealth: Payer: Self-pay | Admitting: Pharmacist

## 2022-07-01 NOTE — Telephone Encounter (Signed)
Contacted patient following missed appoint previous week.   CGM report data was shared with the patient.  We agreed that she is doing very well.  95% of readings in Range and only 5% in the 180-250 range.   We discussed rescheduling.  She will call when her work schedule is available for an afternoon appointment.

## 2022-07-01 NOTE — Telephone Encounter (Signed)
Called pt to follow up if her fatigue improved off of her duloxetine. Pt states she hasn't taken any of her medications. Have discussed with pt multiple times she needs to take her medications. Asked if there is anything we can do to help her with her adherence. She states no. Again advised pt to take her medications as instructed, except for her duloxetine, and monitor for improvement in fatigue. Will call pt in 1 week.

## 2022-07-03 DIAGNOSIS — H31093 Other chorioretinal scars, bilateral: Secondary | ICD-10-CM | POA: Diagnosis not present

## 2022-07-03 DIAGNOSIS — H43822 Vitreomacular adhesion, left eye: Secondary | ICD-10-CM | POA: Diagnosis not present

## 2022-07-03 DIAGNOSIS — E113513 Type 2 diabetes mellitus with proliferative diabetic retinopathy with macular edema, bilateral: Secondary | ICD-10-CM | POA: Diagnosis not present

## 2022-07-08 NOTE — Telephone Encounter (Signed)
Reviewed and agree with Dr Koval's plan.   

## 2022-07-10 NOTE — Telephone Encounter (Signed)
Called pt to follow up on med adherence. Left message, will wait for her return call.

## 2022-07-29 ENCOUNTER — Encounter: Payer: Self-pay | Admitting: *Deleted

## 2022-07-30 ENCOUNTER — Telehealth: Payer: Self-pay | Admitting: *Deleted

## 2022-07-30 NOTE — Patient Outreach (Signed)
  Care Coordination   07/30/2022 Name: IVERNA FENRICH MRN: [id number] DOB: May 01, 1950   Care Coordination Outreach Attempts:  An unsuccessful telephone outreach was attempted today to offer the patient information about available care coordination services.  Follow Up Plan:  Additional outreach attempts will be made to offer the patient care coordination information and services.   Encounter Outcome:  No Answer   Care Coordination Interventions:  No, not indicated    Reece Levy, MSW, LCSW Clinical Social Worker [facility] [phone]

## 2022-07-31 DIAGNOSIS — H43822 Vitreomacular adhesion, left eye: Secondary | ICD-10-CM | POA: Diagnosis not present

## 2022-07-31 DIAGNOSIS — E113513 Type 2 diabetes mellitus with proliferative diabetic retinopathy with macular edema, bilateral: Secondary | ICD-10-CM | POA: Diagnosis not present

## 2022-07-31 DIAGNOSIS — H31093 Other chorioretinal scars, bilateral: Secondary | ICD-10-CM | POA: Diagnosis not present

## 2022-08-05 ENCOUNTER — Telehealth: Payer: Self-pay | Admitting: *Deleted

## 2022-08-05 NOTE — Patient Outreach (Signed)
  Care Coordination   08/05/2022 Name: HERMELA HARDT MRN: [id number] DOB: [birth year]   Care Coordination Outreach Attempts:  A second unsuccessful outreach was attempted today to offer the patient with information about available care coordination services.  Follow Up Plan:  Additional outreach attempts will be made to offer the patient care coordination information and services.   Encounter Outcome:  No Answer   Care Coordination Interventions:  No, not indicated    Reece Levy, MSW, LCSW Clinical Social Worker [facility] [phone]

## 2022-08-15 ENCOUNTER — Telehealth: Payer: Self-pay | Admitting: *Deleted

## 2022-08-15 ENCOUNTER — Ambulatory Visit: Payer: Self-pay | Admitting: *Deleted

## 2022-08-15 NOTE — Patient Outreach (Signed)
  Care Coordination   Follow Up Visit Note   08/15/2022 Name: Heather [name] MRN: [id number] DOB: 1950-04-23  Heather [name] is a 72 y.o. year old female who sees Sabino Dick, DO for primary care. I spoke with  Heather [name] by phone today.  What matters to the patients health and wellness today?  Pt continues to do well- working,  seeking permanent housing, working on finances/budgeting and denies current needs.   Goals Addressed             This Visit's Progress    COMPLETED: Provide support, resources and guidance related to housing, mental health and other SDOH needs       Activities and task to complete in order to accomplish goals.   Continue working and staying busy- glad things are going well ! Follow up with housing resources discussed (may have a waiting period for availability so keep searching and continue with your plan to collect a down payment for security deposit, getting lights turned on, etc.) Continue with compliance of taking medication prescribed by Doctor Personal steps you want to take over the next few weeks ( Pay off debt/accumulate some savings for moving) Follow up on the community resources provided by care guide as well as the [place]360 referral placed for resources  Reach out as needed            SDOH assessments and interventions completed:  Yes     Care Coordination Interventions:  Yes, provided  Interventions Today    Flowsheet Row Most Recent Value  General Interventions   General Interventions Discussed/Reviewed Community Resources  [Pt continues to stay with friend at motel while paying down her debt and saving up for moving in the future]  Mental Health Interventions   Mental Health Discussed/Reviewed Mental Health Discussed, Other  [Pt working her 2 jobs- at [employer] and [employer]. Doing well,  spririts good- denies any concerns or needs]       Follow up plan: No further intervention required.   Encounter Outcome:  Pt.  Visit Completed

## 2022-08-22 ENCOUNTER — Ambulatory Visit (INDEPENDENT_AMBULATORY_CARE_PROVIDER_SITE_OTHER): Payer: Medicare PPO | Admitting: Pharmacist

## 2022-08-22 ENCOUNTER — Encounter: Payer: Self-pay | Admitting: Pharmacist

## 2022-08-22 VITALS — BP 153/79 | HR 75 | Ht 67.0 in | Wt 187.8 lb

## 2022-08-22 DIAGNOSIS — I10 Essential (primary) hypertension: Secondary | ICD-10-CM | POA: Diagnosis not present

## 2022-08-22 DIAGNOSIS — I480 Paroxysmal atrial fibrillation: Secondary | ICD-10-CM | POA: Diagnosis not present

## 2022-08-22 DIAGNOSIS — E1165 Type 2 diabetes mellitus with hyperglycemia: Secondary | ICD-10-CM

## 2022-08-22 LAB — POCT GLYCOSYLATED HEMOGLOBIN (HGB A1C): HbA1c, POC (controlled diabetic range): 6.8 % (ref 0.0–7.0)

## 2022-08-22 MED ORDER — AMLODIPINE BESYLATE 10 MG PO TABS: 10.0000 mg | ORAL_TABLET | Freq: Every day | ORAL | 3 refills | Status: DC

## 2022-08-22 MED ORDER — APIXABAN 5 MG PO TABS
5.0000 mg | ORAL_TABLET | Freq: Two times a day (BID) | ORAL | 5 refills | Status: DC
Start: 2022-08-22 — End: 2023-01-28

## 2022-08-22 NOTE — Patient Instructions (Signed)
Nice to see you today!  Your weight and your A1c are both great today.   Please restart your Eliquis twice daily AND amlodipine once daily.

## 2022-08-22 NOTE — Assessment & Plan Note (Addendum)
LIBERATE Study:  - 8 sensors provided for a 3 month supply. Educated to contact the office if the sensor falls off early and replacements are needed before their next [employer].   Diabetes longstanding currently best control she has ever had with use of Ozempic (semaglutide)  She is also at the lowest weight she has measured in her chart. Patient is able to verbalize appropriate hypoglycemia management plan. Medication adherence appears problematic with all other therapies. Glucose control appears to be excellent.  -Continued GLP-1 Ozempic (semaglutide) at 0.5mg  once weekly.  -Patient educated on purpose, proper use, and potential adverse effects.  -Extensively discussed pathophysiology of diabetes, recommended lifestyle interventions, dietary effects on blood sugar control.

## 2022-08-22 NOTE — Assessment & Plan Note (Signed)
ASCVD risk - secondary prevention in patient with diabetes and NON-adherence will all pills for the last three weeks.   -Following discussion, patient willing to restart Eliquis (apixaban) and amlodipine 10mg  daily.

## 2022-08-22 NOTE — Progress Notes (Signed)
    S:     Chief Complaint  Patient presents with   Medication Management    Diabetes - CGM - Liberate 3 month   72 y.o. female who presents for diabetes evaluation, education, and management in the context of the LIBERATE Study.  PMH is significant for Hypertension, Hyperlipidema, Atrial Fibrillation.  Patient was referred and last seen by Primary Care Provider, Dr. Melba Coon, on 04/10/2022.   At last visit with me patient reported adherence with medication.    Today, patient arrives in spirits and presents without any assistance. She admits to not taking any of her medication except for Ozempic (semaglutide).  She states the pills make her feel lethargic and "bad".    Current diabetes medications include: Ozempic (semaglutide) only - 0.5mg  weekly Current hypertension medications include: Not taking amlodipine/olmesartan Current hyperlipidemia medications include: Not taking rosuvastatin and repatha (evolocumab)  Patient denies adherence with medications, reports missing all medications for the last three weeks with the exception of Ozempic.   Have you been experiencing any side effects to the medications prescribed? Yes ("feels bad" Do you have any problems obtaining medications due to transportation or finances? no  Patient denies hypoglycemic events.   O:   Review of Systems  All other systems reviewed and are negative.   Physical Exam Pulmonary:     Effort: Pulmonary effort is normal.  Neurological:     Mental Status: She is alert.  Psychiatric:        Thought Content: Thought content normal.    Lab Results  Component Value Date   HGBA1C 6.8 08/22/2022     Vitals:   08/22/22 1349 08/22/22 1354  BP: (!) 154/75 (!) 153/79  Pulse: 75   SpO2: 100%      Lipid Panel     Component Value Date/Time   CHOL 280 (H) 04/09/2022 1658   TRIG 179 (H) 04/09/2022 1658   HDL 78 04/09/2022 1658   CHOLHDL 3.6 04/09/2022 1658   CHOLHDL 3.6 08/15/2020 0335   VLDL 20  08/15/2020 0335   LDLCALC 170 (H) 04/09/2022 1658    Clinical Atherosclerotic Cardiovascular Disease (ASCVD): Yes  The ASCVD Risk score (Arnett DK, et al., 2019) failed to calculate for the following reasons:   The patient has a prior MI or stroke diagnosis    A/P:  LIBERATE Study:  - 8 sensors provided for a 3 month supply. Educated to contact the office if the sensor falls off early and replacements are needed before their next [employer].   Diabetes longstanding currently best control she has ever had with use of Ozempic (semaglutide)  She is also at the lowest weight she has measured in her chart. Patient is able to verbalize appropriate hypoglycemia management plan. Medication adherence appears problematic with all other therapies. Glucose control appears to be excellent.  -Continued GLP-1 Ozempic (semaglutide) at 0.5mg  once weekly.  -Patient educated on purpose, proper use, and potential adverse effects.  -Extensively discussed pathophysiology of diabetes, recommended lifestyle interventions, dietary effects on blood sugar control.   ASCVD risk - secondary prevention in patient with diabetes and NON-adherence will all pills for the last three weeks.   -Following discussion, patient willing to restart Eliquis (apixaban) and amlodipine 10mg  daily.   Written patient instructions provided. Patient verbalized understanding of treatment plan.  Total time in face to face counseling 35 minutes.    Follow-up:  Pharmacist 4 weeks. PCP clinic visit in PRN.

## 2022-08-23 NOTE — Progress Notes (Signed)
Reviewed and agree with Dr Koval's plan.   

## 2022-09-04 DIAGNOSIS — E113513 Type 2 diabetes mellitus with proliferative diabetic retinopathy with macular edema, bilateral: Secondary | ICD-10-CM | POA: Diagnosis not present

## 2022-09-04 DIAGNOSIS — H43822 Vitreomacular adhesion, left eye: Secondary | ICD-10-CM | POA: Diagnosis not present

## 2022-09-04 DIAGNOSIS — H31093 Other chorioretinal scars, bilateral: Secondary | ICD-10-CM | POA: Diagnosis not present

## 2022-09-19 ENCOUNTER — Encounter: Payer: Self-pay | Admitting: Pharmacist

## 2022-09-19 ENCOUNTER — Ambulatory Visit: Payer: Medicare PPO | Admitting: Pharmacist

## 2022-09-19 VITALS — BP 149/70 | HR 67 | Wt 187.2 lb

## 2022-09-19 DIAGNOSIS — E1165 Type 2 diabetes mellitus with hyperglycemia: Secondary | ICD-10-CM | POA: Diagnosis not present

## 2022-09-19 DIAGNOSIS — E782 Mixed hyperlipidemia: Secondary | ICD-10-CM | POA: Diagnosis not present

## 2022-09-19 DIAGNOSIS — I10 Essential (primary) hypertension: Secondary | ICD-10-CM | POA: Diagnosis not present

## 2022-09-19 MED ORDER — ROSUVASTATIN CALCIUM 40 MG PO TABS
40.0000 mg | ORAL_TABLET | Freq: Every day | ORAL | 3 refills | Status: DC
Start: 2022-09-19 — End: 2023-01-28

## 2022-09-19 NOTE — Assessment & Plan Note (Signed)
Diabetes longstanding remains best control she has ever had with use of Ozempic (semaglutide) 0.5mg  weekly.  Her weight is stable.  Patient is able to verbalize appropriate hypoglycemia management plan.  Mild GI intolerance persists on day 2-3 following weekly dose administration. -Continued GLP-1 Ozempic (semaglutide) at 0.5mg  once weekly however reduced by 5 clicks in attempt to improve tolerability.   -Patient educated on purpose, proper use, and potential adverse effects.  -Extensively discussed pathophysiology of diabetes, recommended lifestyle interventions, dietary effects on blood sugar control.  - IF willing, consider SGLT2 for renal benefit and glucose support in the future.

## 2022-09-19 NOTE — Progress Notes (Signed)
S:     Chief Complaint  Patient presents with   Medication Management    Diabetes - Liberate Follow-up   72 y.o. female who presents for diabetes evaluation, education, and management in the context of the LIBERATE Study.  PMH is significant for Hypertension, Hyperlipidema, Atrial Fibrillation.  Patient was referred and last seen by Primary Care Provider, Dr. Melba Coon, on 04/09/2022.    At last visit with me patient reported non-adherence with medication.     Today, patient arrives in spirits and presents without any assistance. She reports taking her Ozempic (semaglutide) weekly.  She admits to not taking her other medication pills on schedule.  She reports taking amlodipine for the last week and her eliquis (apixaban) most days only in the evening.        Current diabetes medications include: Ozempic (semaglutide) only - 0.5mg  weekly Current hypertension medications include: amlodipine  Current hyperlipidemia medications include: Not taking rosuvastatin or repatha (evolocumab)  She is willing to consider adding 1 additional therapy to her current regimen.    Patient denies hypoglycemic events.     O:   Review of Systems  Constitutional:  Negative for malaise/fatigue.    Physical Exam Pulmonary:     Effort: Pulmonary effort is normal.  Neurological:     Mental Status: She is alert.  Psychiatric:        Mood and Affect: Mood normal.        Behavior: Behavior normal.        Thought Content: Thought content normal.    Libre3  CGM Download: 09/19/2022 % Time CGM is active: 96% Average Glucose: 127 mg/dL Glucose Management Indicator: 6.3  Glucose Variability: 21.3 (goal <36%) Time in Goal:  - Time in range 70-180: 96% - Time above range: 4% - Time below range: 0% Observed patterns: great control overall   Lab Results  Component Value Date   HGBA1C 6.8 08/22/2022   Vitals:   09/19/22 1323 09/19/22 1335  BP: (!) 142/76 (!) 149/70  Pulse: 67   SpO2: 100%      Lipid Panel     Component Value Date/Time   CHOL 280 (H) 04/09/2022 1658   TRIG 179 (H) 04/09/2022 1658   HDL 78 04/09/2022 1658   CHOLHDL 3.6 04/09/2022 1658   CHOLHDL 3.6 08/15/2020 0335   VLDL 20 08/15/2020 0335   LDLCALC 170 (H) 04/09/2022 1658    Clinical Atherosclerotic Cardiovascular Disease (ASCVD): Yes  The ASCVD Risk score (Arnett DK, et al., 2019) failed to calculate for the following reasons:   The patient has a prior MI or stroke diagnosis   A/P Diabetes longstanding remains best control she has ever had with use of Ozempic (semaglutide) 0.5mg  weekly.  Her weight is stable.  Patient is able to verbalize appropriate hypoglycemia management plan.  Mild GI intolerance persists on day 2-3 following weekly dose administration. -Continued GLP-1 Ozempic (semaglutide) at 0.5mg  once weekly however reduced by 5 clicks in attempt to improve tolerability.   -Patient educated on purpose, proper use, and potential adverse effects.  -Extensively discussed pathophysiology of diabetes, recommended lifestyle interventions, dietary effects on blood sugar control.  - IF willing, consider SGLT2 for renal benefit and glucose support in the future.    ASCVD risk - secondary prevention in patient with diabetes and history of CAD. Willing to take statin daily - Restart Rosuvastatin 40mg  daily  Hypertension, improved control with use of amlopine 10mg  daily for the last week.  Patient admits non-adherence most  days prior to the last week.  She is motivated to remain adherent immediately prior to scheduled visits.  -Continue amlodipine 10mg  daily (asked to take this EVERY day)  Afib - intermittent adherence with eliquis (apixaban) Encouraged to use phone alarm to help patient remember to take AM Eliquis dose.    Written patient instructions provided. Patient verbalized understanding of treatment plan.  Total time in face to face counseling 33 minutes.    Follow-up:  Pharmacist 1  month. PCP clinic visit in within the upcoming 1-2 months to assist with adherence support and to meet to PCP.

## 2022-09-19 NOTE — Assessment & Plan Note (Signed)
Hypertension, improved control with use of amlopine 10mg  daily for the last week.  Patient admits non-adherence most days prior to the last week.  She is motivated to remain adherent immediately prior to scheduled visits.  -Continue amlodipine 10mg  daily (asked to take this EVERY day)

## 2022-09-19 NOTE — Patient Instructions (Addendum)
It was nice to see you today!  Your goal blood sugar is 80-130 before eating and less than 180 after eating.  Medication Changes: Continue taking your eliquis, amlodipine and Ozempic (reduce by 5 clicks with next doses)  Restart Rosuvastatin 40mg  daily  Keep up the good work with diet and exercise. Aim for a diet full of vegetables, fruit and lean meats (chicken, [place], fish). Try to limit salt intake by eating fresh or frozen vegetables (instead of canned), rinse canned vegetables prior to cooking and do not add any additional salt to meals.

## 2022-09-19 NOTE — Assessment & Plan Note (Signed)
ASCVD risk - secondary prevention in patient with diabetes and history of CAD. Willing to take statin daily - Restart Rosuvastatin 40mg  daily

## 2022-09-20 NOTE — Progress Notes (Signed)
Reviewed and agree with Dr Koval's plan.   

## 2022-09-26 ENCOUNTER — Other Ambulatory Visit (HOSPITAL_COMMUNITY): Payer: Self-pay

## 2022-09-26 ENCOUNTER — Other Ambulatory Visit: Payer: Self-pay | Admitting: Pharmacist

## 2022-09-26 DIAGNOSIS — E1165 Type 2 diabetes mellitus with hyperglycemia: Secondary | ICD-10-CM

## 2022-09-26 MED ORDER — SEMAGLUTIDE(0.25 OR 0.5MG/DOS) 2 MG/3ML ~~LOC~~ SOPN
0.5000 mg | PEN_INJECTOR | SUBCUTANEOUS | 3 refills | Status: DC
Start: 2022-09-26 — End: 2023-01-28
  Filled 2022-09-26: qty 3, 28d supply, fill #0
  Filled 2022-10-24: qty 3, 28d supply, fill #1
  Filled 2022-11-21: qty 3, 28d supply, fill #2
  Filled 2022-12-19: qty 3, 28d supply, fill #3

## 2022-09-26 NOTE — Progress Notes (Signed)
Patient arrives in office stating she needs help with Ozempic access.   Patient reports her pharmacy is no longer stocking Ozempic (semglutide)   Current Medications include: Ozempic (semaglutide) 0.5 minus five clicks for tolerability only.  Patient denies any significant medication related side effects.  Medication Plan: -continue    Ozempic (semaglutide) at 0.5mg  weekly (minus 5 clicks if GI intolerance continues.    Agreed to switch prescription to [facility].    Patient aware of location for pick-up  Total time with patient call and documentation of interaction: 11 minutes.

## 2022-09-26 NOTE — Progress Notes (Signed)
Reviewed and agree with Dr Koval's plan.   

## 2022-10-09 DIAGNOSIS — E113513 Type 2 diabetes mellitus with proliferative diabetic retinopathy with macular edema, bilateral: Secondary | ICD-10-CM | POA: Diagnosis not present

## 2022-10-09 DIAGNOSIS — H43822 Vitreomacular adhesion, left eye: Secondary | ICD-10-CM | POA: Diagnosis not present

## 2022-10-09 DIAGNOSIS — H31093 Other chorioretinal scars, bilateral: Secondary | ICD-10-CM | POA: Diagnosis not present

## 2022-10-14 ENCOUNTER — Encounter: Payer: Self-pay | Admitting: Pharmacist

## 2022-10-14 NOTE — Progress Notes (Addendum)
Adherence check-in. Appears adherent based on Ozempic 28 day supply on 09/26/22.

## 2022-10-17 ENCOUNTER — Ambulatory Visit (INDEPENDENT_AMBULATORY_CARE_PROVIDER_SITE_OTHER): Payer: Medicare PPO | Admitting: Pharmacist

## 2022-10-17 ENCOUNTER — Encounter: Payer: Self-pay | Admitting: Pharmacist

## 2022-10-17 VITALS — BP 152/70 | HR 72 | Ht 68.0 in | Wt 185.2 lb

## 2022-10-17 DIAGNOSIS — I10 Essential (primary) hypertension: Secondary | ICD-10-CM

## 2022-10-17 DIAGNOSIS — E782 Mixed hyperlipidemia: Secondary | ICD-10-CM

## 2022-10-17 DIAGNOSIS — I25110 Atherosclerotic heart disease of native coronary artery with unstable angina pectoris: Secondary | ICD-10-CM | POA: Diagnosis not present

## 2022-10-17 DIAGNOSIS — E1165 Type 2 diabetes mellitus with hyperglycemia: Secondary | ICD-10-CM

## 2022-10-17 MED ORDER — NITROGLYCERIN 0.4 MG SL SUBL: 0.4000 mg | SUBLINGUAL_TABLET | SUBLINGUAL | 2 refills | Status: DC | PRN

## 2022-10-17 MED ORDER — CARVEDILOL 3.125 MG PO TABS
3.1250 mg | ORAL_TABLET | Freq: Two times a day (BID) | ORAL | 5 refills | Status: DC
Start: 2022-10-17 — End: 2023-05-01

## 2022-10-17 NOTE — Assessment & Plan Note (Signed)
ASCVD risk - secondary prevention in patient with diabetes. Last LDL is 170 not at goal of <16 mg/dL. Patient reports not taking cholesterol medications due to unwillingness to take pills. - Non-adherent with rosuvastatin 40 mg once daily.  - Non-adherent with Repatha (evolocumab) once every 14 days. -Asked patient to schedule with Cardiology/Lipid Clinic to discuss options, possibly Leqvio (inclisiran) to improve potential for goal achievement.

## 2022-10-17 NOTE — Patient Instructions (Signed)
It was nice to see you today!  Your goal blood sugar is 80-130 before eating and less than 180 after eating.  Medication Changes: Restart Eliquis (apixaban) 5 mg twice daily.  Restart Coreg (carvedilol) 3.125 mg twice daily.  Take the nitroglycerin as needed for chest pain.  We have sent the scripts for nitroglycerin and carvedilol to the pharmacy for pick up.  Schedule follow up appointment with cardiology clinic.  Continue all other medication the same.   Monitor blood sugars at home and keep a log (glucometer or piece of paper) to bring with you to your next visit.  Keep up the good work with diet and exercise. Aim for a diet full of vegetables, fruit and lean meats (chicken, [place], fish). Try to limit salt intake by eating fresh or frozen vegetables (instead of canned), rinse canned vegetables prior to cooking and do not add any additional salt to meals.

## 2022-10-17 NOTE — Assessment & Plan Note (Signed)
Hypertension longstanding currently uncontrolled with office reading of 158/77 mm Hg. Blood pressure goal of <130 mmHg systolic. Blood pressure control is suboptimal due to nonadherence with medication. Patient reports not taking BP medication. - Non-adherent with amlodipine 10 mg once daily. - Reevaluate willingness to take oral medication vs. clonidine patch at next visit

## 2022-10-17 NOTE — Assessment & Plan Note (Signed)
Diabetes longstanding currently controlled with just Ozempic (semaglutide) with most recent GMI of 6.2%. Patient is not taking Jardiance (empagliflozin). Reports reluctance to take oral  medications. Patient is able to verbalize appropriate hypoglycemia management plan.  - Continued Ozempic (semaglutide) 0.5 mg once weekly. -Patient educated on purpose, proper use, and potential adverse effects. -Extensively discussed pathophysiology of diabetes, recommended lifestyle interventions, dietary effects on blood sugar control.  -Counseled on s/sx of and management of hypoglycemia.

## 2022-10-17 NOTE — Progress Notes (Signed)
S:     Chief Complaint  Patient presents with   Diabetes Management Plan   72 y.o. female who presents for diabetes evaluation, education, and management. Patient arrives in good spirits and presents without any assistance.  Patient was referred and last seen by Primary Care Provider, Dr. Melba Coon, on 04/09/22.   PMH is significant for Hypertension, Hyperlipidema, Atrial Fibrillation.  At last visit, continued Ozempic (semaglutide) and amlodipine. Restarted rosuvastatin.  Current diabetes medications include: Ozempic (semaglutide) 0.5mg  weekly, Not taking Jardiance (empagliflozin) Current hypertension medications include: Not taking amlodipine  Current hyperlipidemia medications include: Not taking rosuvastatin or Repatha (evolocumab)   Patient denies adherence with medications, reports not taking any oral medications as she does not like pills. Only medication she has been adherent to is Ozempic (semaglutide).   Patient reports chest pain that feels like burps she cannot release.   Insurance coverage: [employer]  Patient denies hypoglycemic events. Patient reports self foot exams.   O:   Review of Systems  All other systems reviewed and are negative.   Physical Exam Constitutional:      Appearance: Normal appearance.  Neurological:     Mental Status: She is alert.  Psychiatric:        Mood and Affect: Mood normal.        Behavior: Behavior normal.        Thought Content: Thought content normal.        Judgment: Judgment normal.    Libre3  CGM Download: 10/17/22 % Time CGM is active: 94% Average Glucose: 119 mg/dL Glucose Management Indicator: 6.2% Glucose Variability: 18.3 (goal <36%) Time in Goal:  - Time in range 70-180: 99% - Time above range: 1% - Time below range: 0% Observed patterns: great control overall     Lab Results  Component Value Date   HGBA1C 6.8 08/22/2022   Vitals:   10/17/22 1330 10/17/22 1338  BP: (!) 158/77 (!) 152/70   Pulse: 74 72  SpO2: 100% 100%    Lipid Panel     Component Value Date/Time   CHOL 280 (H) 04/09/2022 1658   TRIG 179 (H) 04/09/2022 1658   HDL 78 04/09/2022 1658   CHOLHDL 3.6 04/09/2022 1658   CHOLHDL 3.6 08/15/2020 0335   VLDL 20 08/15/2020 0335   LDLCALC 170 (H) 04/09/2022 1658    Clinical Atherosclerotic Cardiovascular Disease (ASCVD): Yes  Known CAD and history of NSTEMI The ASCVD Risk score (Arnett DK, et al., 2019) failed to calculate for the following reasons:   The patient has a prior MI or stroke diagnosis   A/P: Diabetes longstanding currently controlled with just Ozempic (semaglutide) with most recent GMI of 6.2%. Patient is not taking Jardiance (empagliflozin). Reports reluctance to take oral  medications. Patient is able to verbalize appropriate hypoglycemia management plan.  - Continued Ozempic (semaglutide) 0.5 mg once weekly. -Patient educated on purpose, proper use, and potential adverse effects. -Extensively discussed pathophysiology of diabetes, recommended lifestyle interventions, dietary effects on blood sugar control.  -Counseled on s/sx of and management of hypoglycemia.   ASCVD risk - secondary prevention in patient with diabetes. Last LDL is 170 not at goal of <13 mg/dL. Patient reports not taking cholesterol medications due to unwillingness to take pills. - Non-adherent with rosuvastatin 40 mg once daily.  - Non-adherent with Repatha (evolocumab) once every 14 days. -Asked patient to schedule with Cardiology/Lipid Clinic to discuss options, possibly Leqvio (inclisiran) to improve potential for goal achievement.   Hypertension longstanding currently uncontrolled  with office reading of 158/77 mm Hg. Blood pressure goal of <130 mmHg systolic. Blood pressure control is suboptimal due to nonadherence with medication. Patient reports not taking BP medication. - Non-adherent with amlodipine 10 mg once daily. - Reevaluate willingness to take oral medication vs.  clonidine patch at next visit  Thoroughly educated patient on the importance of medication adherence. Restarted Eliquis (apixaban) 5 mg twice daily, carvedilol 3.125 mg twice daily, and nitroglycerin 0.4 mg as needed for chest pain. Patient has Eliquis on hand so refilled carvedilol and nitroglycerin for patient to pick up from pharmacy.   Given nonadherence to medications, advised patient to follow up with cardiology clinic for further evaluation.  Written patient instructions provided. Patient verbalized understanding of treatment plan.  Total time in face to face counseling 38 minutes.    Follow-up:  Pharmacist visit with Dr. Raymondo Band on 11/07/22 PCP clinic visit PRN Patient seen with Rickey Primus, PharmD Candidate and Andee Poles, PharmD Candidate.

## 2022-10-18 ENCOUNTER — Other Ambulatory Visit: Payer: Self-pay | Admitting: Family Medicine

## 2022-10-18 ENCOUNTER — Encounter: Payer: Self-pay | Admitting: Family Medicine

## 2022-10-18 DIAGNOSIS — E1149 Type 2 diabetes mellitus with other diabetic neurological complication: Secondary | ICD-10-CM

## 2022-10-18 NOTE — Progress Notes (Signed)
Reviewed and agree with Dr Koval's plan.   

## 2022-10-18 NOTE — Progress Notes (Signed)
Referring patient for diabetic foot care per last visit with Dr. Raymondo Band.

## 2022-10-24 ENCOUNTER — Other Ambulatory Visit (HOSPITAL_COMMUNITY): Payer: Self-pay

## 2022-11-04 DIAGNOSIS — Z794 Long term (current) use of insulin: Secondary | ICD-10-CM | POA: Diagnosis not present

## 2022-11-04 DIAGNOSIS — Z961 Presence of intraocular lens: Secondary | ICD-10-CM | POA: Diagnosis not present

## 2022-11-04 DIAGNOSIS — E113411 Type 2 diabetes mellitus with severe nonproliferative diabetic retinopathy with macular edema, right eye: Secondary | ICD-10-CM | POA: Diagnosis not present

## 2022-11-04 DIAGNOSIS — H35033 Hypertensive retinopathy, bilateral: Secondary | ICD-10-CM | POA: Diagnosis not present

## 2022-11-04 DIAGNOSIS — H35373 Puckering of macula, bilateral: Secondary | ICD-10-CM | POA: Diagnosis not present

## 2022-11-04 DIAGNOSIS — E113312 Type 2 diabetes mellitus with moderate nonproliferative diabetic retinopathy with macular edema, left eye: Secondary | ICD-10-CM | POA: Diagnosis not present

## 2022-11-06 ENCOUNTER — Ambulatory Visit: Payer: Medicare PPO | Admitting: Podiatry

## 2022-11-07 ENCOUNTER — Encounter (INDEPENDENT_AMBULATORY_CARE_PROVIDER_SITE_OTHER): Payer: Medicare PPO | Admitting: Pharmacist

## 2022-11-07 ENCOUNTER — Encounter: Payer: Self-pay | Admitting: Pharmacist

## 2022-11-07 VITALS — BP 145/78 | HR 88 | Ht 67.0 in | Wt 187.0 lb

## 2022-11-07 DIAGNOSIS — I10 Essential (primary) hypertension: Secondary | ICD-10-CM

## 2022-11-07 DIAGNOSIS — E782 Mixed hyperlipidemia: Secondary | ICD-10-CM

## 2022-11-07 DIAGNOSIS — E1165 Type 2 diabetes mellitus with hyperglycemia: Secondary | ICD-10-CM

## 2022-11-07 MED ORDER — DICLOFENAC SODIUM 1 % EX GEL: 2.0000 g | Freq: Four times a day (QID) | CUTANEOUS | 1 refills | Status: DC

## 2022-11-07 MED ORDER — CLONIDINE 0.1 MG/24HR TD PTWK
0.1000 mg | MEDICATED_PATCH | TRANSDERMAL | 12 refills | Status: DC
Start: 2022-11-07 — End: 2023-01-28

## 2022-11-07 NOTE — Research (Signed)
S:     Chief Complaint  Patient presents with   Medication Management    Diabetes and HTN   72 y.o. female who presents for diabetes evaluation, education, and management. Patient arrives in good spirits and presents without any assistance.   Patient was referred and last seen by Primary Care Provider, Dr. Melba Coon, on 04/09/22.   PMH is significant for  Hypertension, Hyperlipidema, Atrial Fibrillation.  At last visit, restarted Eliquis (apixaban) 5 mg twice daily, carvedilol 3.125 mg twice daily, and nitroglycerin 0.4 mg as needed for chest pain.  Diabetes was diagnosed in ~2017 according to Epic.   Current diabetes medications include: Ozempic (semaglutide) 0.5mg  weekly, Not taking Jardiance (empagliflozin)  Current hypertension medications include: Not taking amlodipine or carvedilol Current hyperlipidemia medications include: Not taking rosuvastatin or Repatha (evolocumab)   Patient denies adherence with any oral medications, reports not taking any oral medications as she does not like pills. Only medication she has been adherent to is Ozempic (semaglutide).   Discussed her willingness to use a patch and she was open to the option.   Insurance coverage: [employer]   O:   Review of Systems  All other systems reviewed and are negative.   Physical Exam Constitutional:      Appearance: Normal appearance.  Pulmonary:     Effort: Pulmonary effort is normal.  Neurological:     Mental Status: She is alert.  Psychiatric:        Mood and Affect: Mood normal.        Behavior: Behavior normal.        Thought Content: Thought content normal.        Judgment: Judgment normal.      Lab Results  Component Value Date   HGBA1C 6.8 08/22/2022   Vitals:   11/07/22 1513 11/07/22 1517  BP: (!) 143/78 (!) 145/78  Pulse: 88   SpO2: 100%     Lipid Panel     Component Value Date/Time   CHOL 280 (H) 04/09/2022 1658   TRIG 179 (H) 04/09/2022 1658   HDL 78 04/09/2022  1658   CHOLHDL 3.6 04/09/2022 1658   CHOLHDL 3.6 08/15/2020 0335   VLDL 20 08/15/2020 0335   LDLCALC 170 (H) 04/09/2022 1658    Clinical Atherosclerotic Cardiovascular Disease (ASCVD): Yes  Prior NSTEMI  A/P: Diabetes longstanding currently controlled with just Ozempic (semaglutide) with most recent GMI of 6.2%. Patient is not taking Jardiance (empagliflozin). Reports reluctance to take oral  medications. Patient is able to verbalize appropriate hypoglycemia management plan.  - Continued Ozempic (semaglutide) 0.5 mg once weekly. -Patient educated on purpose, proper use, and potential adverse effects. -Extensively discussed pathophysiology of diabetes, recommended lifestyle interventions, dietary effects on blood sugar control.  -Counseled on s/sx of and management of hypoglycemia.  -Next A1c anticipated at Liberate visit in 2 weeks.    ASCVD risk - secondary prevention in patient with diabetes. Last LDL is 170 not at goal of <40 mg/dL. Patient reports not taking cholesterol medications due to unwillingness to take pills. - Non-adherent with rosuvastatin 40 mg once daily.  - Non-adherent with Repatha (evolocumab) once every 14 days. - Asked patient to reschedule with Cardiology/Lipid Clinic to discuss options, possibly Leqvio (inclisiran) to improve potential for goal achievement/adherence.   Hypertension longstanding currently uncontrolled with office reading of 143/78 mm Hg. Blood pressure goal of <130 mmHg systolic. Blood pressure control is suboptimal due to nonadherence with medication. Patient reports taking BP medications last week. -  Non-adherent with amlodipine 10 mg once daily. - Non-adherent with carvedilol 3.125 mg twice daily. - Initiated Catapres (clonidine patch once weekly.  Thoroughly educated patient on the importance of medication adherence. Encouraged patient to continue Eliquis (apixaban) 5 mg twice daily and rosuvastatin 40 mg once daily. Patient has medications on  hand.  Refilled Voltaren gel as per patient's request.  Written patient instructions provided. Patient verbalized understanding of treatment plan.  Total time in face to face counseling 32 minutes.    Follow-up:  Pharmacist visit 11/26/22 for 6 month Liberate end of study visit.  PCP clinic visit PRN Patient seen with Alesia Banda, PharmD Candidate and Andee Poles, PharmD Candidate.

## 2022-11-07 NOTE — Assessment & Plan Note (Signed)
Hypertension longstanding currently uncontrolled with office reading of 143/78 mm Hg. Blood pressure goal of <130 mmHg systolic. Blood pressure control is suboptimal due to nonadherence with medication. Patient reports taking BP medications last week. - Non-adherent with amlodipine 10 mg once daily. - Non-adherent with carvedilol 3.125 mg twice daily. - Initiated Catapres (clonidine patch once weekly.

## 2022-11-07 NOTE — Patient Instructions (Signed)
It was nice to see you today!  Your goal blood pressure is <130/80 mm Hg.  Medication Changes: START Clonidine patch once weekly.  Pick up Voltaren gel and clonidine patch from the pharmacy.   Call Cardiology/Lipid Clinic to schedule appointment.   Continue all other medication the same.   Monitor blood pressure at home daily and keep a log (on your phone or piece of paper) to bring with you to your next visit. Write down date, time, blood pressure and pulse.  Keep up the good work with diet and exercise. Aim for a diet full of vegetables, fruit and lean meats (chicken, [place], fish). Try to limit salt intake by eating fresh or frozen vegetables (instead of canned), rinse canned vegetables prior to cooking and do not add any additional salt to meals.

## 2022-11-07 NOTE — Assessment & Plan Note (Signed)
Diabetes longstanding currently controlled with just Ozempic (semaglutide) with most recent GMI of 6.2%. Patient is not taking Jardiance (empagliflozin). Reports reluctance to take oral  medications. Patient is able to verbalize appropriate hypoglycemia management plan.  - Continued Ozempic (semaglutide) 0.5 mg once weekly. -Next A1c anticipated at Liberate visit in 2 weeks.

## 2022-11-07 NOTE — Assessment & Plan Note (Signed)
ASCVD risk - secondary prevention in patient with diabetes. Last LDL is 170 not at goal of <16 mg/dL. Patient reports not taking cholesterol medications due to unwillingness to take pills. - Non-adherent with rosuvastatin 40 mg once daily.  - Non-adherent with Repatha (evolocumab) once every 14 days. - Asked patient to reschedule with Cardiology/Lipid Clinic to discuss options, possibly Leqvio (inclisiran) to improve potential for goal achievement/adherence.

## 2022-11-08 NOTE — Progress Notes (Signed)
Reviewed and agree with Dr Koval's plan.   

## 2022-11-13 DIAGNOSIS — E113513 Type 2 diabetes mellitus with proliferative diabetic retinopathy with macular edema, bilateral: Secondary | ICD-10-CM | POA: Diagnosis not present

## 2022-11-13 DIAGNOSIS — H31093 Other chorioretinal scars, bilateral: Secondary | ICD-10-CM | POA: Diagnosis not present

## 2022-11-13 DIAGNOSIS — H43822 Vitreomacular adhesion, left eye: Secondary | ICD-10-CM | POA: Diagnosis not present

## 2022-11-20 ENCOUNTER — Encounter: Payer: Self-pay | Admitting: Podiatry

## 2022-11-20 ENCOUNTER — Ambulatory Visit (INDEPENDENT_AMBULATORY_CARE_PROVIDER_SITE_OTHER): Payer: Medicare PPO | Admitting: Podiatry

## 2022-11-20 DIAGNOSIS — M79674 Pain in right toe(s): Secondary | ICD-10-CM

## 2022-11-20 DIAGNOSIS — M79675 Pain in left toe(s): Secondary | ICD-10-CM | POA: Diagnosis not present

## 2022-11-20 DIAGNOSIS — E1165 Type 2 diabetes mellitus with hyperglycemia: Secondary | ICD-10-CM | POA: Diagnosis not present

## 2022-11-20 DIAGNOSIS — B351 Tinea unguium: Secondary | ICD-10-CM | POA: Diagnosis not present

## 2022-11-20 MED ORDER — PREGABALIN 100 MG PO CAPS
100.0000 mg | ORAL_CAPSULE | Freq: Two times a day (BID) | ORAL | 0 refills | Status: DC
Start: 2022-11-20 — End: 2023-01-28

## 2022-11-20 NOTE — Progress Notes (Signed)
Subjective:  Patient ID: Heather [name], female    DOB: 1951-01-21,  MRN: [id number]  Chief Complaint  Patient presents with   Numbness   72 y.o. female returns for the above complaint.  Patient presents with thickened likely dystrophic mycotic toenails x 10 mild pain on palpation hurts with ambulation or shoe pressure.  She would like.-Is not a regular self  Objective:  There were no vitals filed for this visit. Podiatric Exam: Vascular: dorsalis pedis and posterior tibial pulses are palpable bilateral. Capillary return is immediate. Temperature gradient is WNL. Skin turgor WNL  Sensorium: Normal Semmes Weinstein monofilament test. Normal tactile sensation bilaterally. Nail Exam: Pt has thick disfigured discolored nails with subungual debris noted bilateral entire nail hallux through fifth toenails.  Pain on palpation to the nails. Ulcer Exam: There is no evidence of ulcer or pre-ulcerative changes or infection. Orthopedic Exam: Muscle tone and strength are WNL. No limitations in general ROM. No crepitus or effusions noted.  Skin: No Porokeratosis. No infection or ulcers    Assessment & Plan:   1. Pain due to onychomycosis of toenails of both feet   2. Type 2 diabetes mellitus with hyperglycemia, without long-term current use of insulin (HCC)     Patient was evaluated and treated and all questions answered.  Onychomycosis with pain  -Nails palliatively debrided as below. -Educated on self-care  Procedure: Nail Debridement Rationale: pain  Type of Debridement: manual, sharp debridement. Instrumentation: Nail nipper, rotary burr. Number of Nails: 10  Procedures and Treatment: Consent by patient was obtained for treatment procedures. The patient understood the discussion of treatment and procedures well. All questions were answered thoroughly reviewed. Debridement of mycotic and hypertrophic toenails, 1 through 5 bilateral and clearing of subungual debris. No ulceration, no  infection noted.  Return Visit-Office Procedure: Patient instructed to return to the office for a follow up visit 3 months for continued evaluation and treatment.  Nicholes Rough, DPM    No follow-ups on file.

## 2022-11-21 ENCOUNTER — Other Ambulatory Visit (HOSPITAL_COMMUNITY): Payer: Self-pay

## 2022-11-25 ENCOUNTER — Ambulatory Visit (INDEPENDENT_AMBULATORY_CARE_PROVIDER_SITE_OTHER): Payer: Medicare PPO | Admitting: Podiatry

## 2022-11-25 ENCOUNTER — Encounter: Payer: Self-pay | Admitting: Podiatry

## 2022-11-25 DIAGNOSIS — M79674 Pain in right toe(s): Secondary | ICD-10-CM | POA: Insufficient documentation

## 2022-11-25 DIAGNOSIS — G629 Polyneuropathy, unspecified: Secondary | ICD-10-CM

## 2022-11-25 DIAGNOSIS — E1165 Type 2 diabetes mellitus with hyperglycemia: Secondary | ICD-10-CM

## 2022-11-25 DIAGNOSIS — B351 Tinea unguium: Secondary | ICD-10-CM | POA: Diagnosis not present

## 2022-11-25 DIAGNOSIS — M79675 Pain in left toe(s): Secondary | ICD-10-CM | POA: Diagnosis not present

## 2022-11-25 NOTE — Progress Notes (Signed)
This patient returns to my office for at risk foot care.  This patient requires this care by a professional since this patient will be at risk due to having diabetic neuropathy. This patient is unable to cut nails herself since the patient cannot reach her nails.These nails are painful walking and wearing shoes.  This patient presents for at risk foot care today. Dr.  Allena Katz treated her last week.  General Appearance  Alert, conversant and in no acute stress.  Vascular  Dorsalis pedis and posterior tibial  pulses are palpable  bilaterally.  Capillary return is within normal limits  bilaterally. Temperature is within normal limits  bilaterally.  Neurologic  Senn-Weinstein monofilament wire test within normal limits  bilaterally. Muscle power within normal limits bilaterally.  Nails Thick disfigured discolored nails with subungual debris  from hallux to fifth toes bilaterally. No evidence of bacterial infection or drainage bilaterally.  Orthopedic  No limitations of motion  feet .  No crepitus or effusions noted.  No bony pathology or digital deformities noted.  Skin  normotropic skin with no porokeratosis noted bilaterally.  No signs of infections or ulcers noted.     Onychomycosis  Pain in right toes  Pain in left toes  Consent was obtained for treatment procedures.   Mechanical debridement of nails 1-5  bilaterally performed with a nail nipper.  Filed with dremel without incident.    Return office visit    3 months                  Told patient to return for periodic foot care and evaluation due to potential at risk complications.   Helane Gunther DPM

## 2022-11-26 ENCOUNTER — Encounter (INDEPENDENT_AMBULATORY_CARE_PROVIDER_SITE_OTHER): Payer: Medicare PPO | Admitting: Pharmacist

## 2022-11-26 ENCOUNTER — Encounter: Payer: Self-pay | Admitting: Pharmacist

## 2022-11-26 VITALS — BP 177/80 | HR 76 | Wt 193.4 lb

## 2022-11-26 DIAGNOSIS — Z7984 Long term (current) use of oral hypoglycemic drugs: Secondary | ICD-10-CM

## 2022-11-26 DIAGNOSIS — I10 Essential (primary) hypertension: Secondary | ICD-10-CM

## 2022-11-26 DIAGNOSIS — E1165 Type 2 diabetes mellitus with hyperglycemia: Secondary | ICD-10-CM

## 2022-11-26 LAB — POCT GLYCOSYLATED HEMOGLOBIN (HGB A1C): HbA1c, POC (controlled diabetic range): 6.6 % (ref 0.0–7.0)

## 2022-11-26 NOTE — Patient Instructions (Addendum)
It was nice to see you today!  Today was the last visit for the 6 month Liberate Study with the [place] [facility]. Thank you for participating.  It is appropriate to stop using your CGM as your glucose readings are doing very well.   Your goal blood sugar is 80-130 before eating and less than 180 after eating.  Medication Changes: Continue all other medication the same. Ozempic (semaglutide) at 0.5mg  once weekly  Continue with blood pressure patch medication - apply one patch weekly.  RESTART - amlodipine 10mg  once a day  Keep up the good work with diet and exercise. Aim for a diet full of vegetables, fruit and lean meats (chicken, [place], fish). Try to limit salt intake by eating fresh or frozen vegetables (instead of canned), rinse canned vegetables prior to cooking and do not add any additional salt to meals.

## 2022-11-26 NOTE — Assessment & Plan Note (Signed)
-   Provided update on end of sensor supply information as she is not taking insulin and insurance coverage is minimal.  We agreed that $75 anticipated cost may be too much currently as her glucose control with Ozempic (semaglutide) has been good.  -Continued GLP-1 Ozempic (semaglutide) at 0.5mg  weekly. .  -Extensively discussed pathophysiology of diabetes, recommended lifestyle interventions, dietary effects on blood sugar control.

## 2022-11-26 NOTE — Assessment & Plan Note (Signed)
Hypertension longstanding and remains currently poorly controlled due to lack of willingness to take multiple oral medications. Blood pressure goal of <130 mmHg. Medication adherence poor with oral. Blood pressure control is suboptimal due to unwillingness to take amlodipine or carvedilol. Following discussion patient agreed to restart amlodipine 10mg  daily -Continue clonidine patch at this time.

## 2022-11-26 NOTE — Research (Signed)
    S:     Chief Complaint  Patient presents with   Medication Management    Diabetes - Hypertension - Liberate 6 month end of study   72 y.o. female who presents for diabetes evaluation, education, and management. Patient arrives in good spirits and presents without any assistance. She was appreciative of the help with her glucose control over the last 6 months.    Patient was referred and last seen by Primary Care Provider, Dr. Melba Coon, on 04/09/22.    PMH is significant for  Hypertension, Hyperlipidema, Atrial Fibrillation.  At last visit, patient was minimally adherent with oral medications and clonidine patch was initiated as an alternative.    Diabetes was diagnosed in ~2017 according to Epic.    Current diabetes medications include: Ozempic (semaglutide) 0.5mg  weekly,  Current hypertension medications include: Not taking amlodipine or carvedilol Current hyperlipidemia medications include: Not taking rosuvastatin or Repatha (evolocumab)    Patient denies adherence with any oral medications, reports not taking any oral medications as she does not like pills. Only medication she has been adherent to is Ozempic (semaglutide), pregabalin and clonidine patch. weekly.    O:   Review of Systems  All other systems reviewed and are negative.   Physical Exam Pulmonary:     Effort: Pulmonary effort is normal.  Musculoskeletal:     Right lower leg: No edema.     Left lower leg: No edema.  Neurological:     Mental Status: She is alert.  Psychiatric:        Mood and Affect: Mood normal.        Behavior: Behavior normal.        Thought Content: Thought content normal.      Lab Results  Component Value Date   HGBA1C 6.6 11/26/2022     Vitals:   11/26/22 1520  BP: (!) 177/80  Pulse: 76  SpO2: 100%     Lipid Panel     Component Value Date/Time   CHOL 280 (H) 04/09/2022 1658   TRIG 179 (H) 04/09/2022 1658   HDL 78 04/09/2022 1658   CHOLHDL 3.6 04/09/2022 1658    CHOLHDL 3.6 08/15/2020 0335   VLDL 20 08/15/2020 0335   LDLCALC 170 (H) 04/09/2022 1658     A/P:  LIBERATE Study:  - Provided update on end of sensor supply information as she is not taking insulin and insurance coverage is minimal.  We agreed that $75 anticipated cost may be too much currently as her glucose control with Ozempic (semaglutide) has been good.  -Continued GLP-1 Ozempic (semaglutide) at 0.5mg  weekly. .  -Extensively discussed pathophysiology of diabetes, recommended lifestyle interventions, dietary effects on blood sugar control.   Hypertension longstanding and remains currently poorly controlled due to lack of willingness to take multiple oral medications. Blood pressure goal of <130 mmHg. Medication adherence poor with oral. Blood pressure control is suboptimal due to unwillingness to take amlodipine or carvedilol. Following discussion patient agreed to restart amlodipine 10mg  daily -Continue clonidine patch at this time.   Written patient instructions provided. Patient verbalized understanding of treatment plan.  Total time in face to face counseling 22 minutes.    Follow-up:  Pharmacist  PCP clinic visit 12/10/2022.  Patient seen with Caprice Beaver, PharmD Candidate and Shona Simpson, PharmD Candidate. Marland Kitchen

## 2022-12-02 NOTE — Progress Notes (Signed)
Reviewed and agree with Dr Koval's plan.   

## 2022-12-10 ENCOUNTER — Ambulatory Visit: Payer: Medicare PPO | Admitting: Pharmacist

## 2022-12-19 ENCOUNTER — Other Ambulatory Visit (HOSPITAL_COMMUNITY): Payer: Self-pay

## 2022-12-30 ENCOUNTER — Ambulatory Visit: Payer: Self-pay | Admitting: Family Medicine

## 2023-01-27 ENCOUNTER — Observation Stay (HOSPITAL_COMMUNITY)
Admission: EM | Admit: 2023-01-27 | Discharge: 2023-01-28 | Disposition: A | Discharge status: Home / Self Care | Payer: Medicare HMO | Attending: Family Medicine | Admitting: Family Medicine

## 2023-01-27 ENCOUNTER — Inpatient Hospital Stay (HOSPITAL_COMMUNITY): Payer: Medicare HMO

## 2023-01-27 ENCOUNTER — Encounter (HOSPITAL_COMMUNITY): Payer: Self-pay

## 2023-01-27 ENCOUNTER — Other Ambulatory Visit: Payer: Self-pay

## 2023-01-27 ENCOUNTER — Emergency Department (HOSPITAL_COMMUNITY): Payer: Medicare HMO

## 2023-01-27 DIAGNOSIS — E785 Hyperlipidemia, unspecified: Secondary | ICD-10-CM | POA: Insufficient documentation

## 2023-01-27 DIAGNOSIS — Z87891 Personal history of nicotine dependence: Secondary | ICD-10-CM | POA: Diagnosis not present

## 2023-01-27 DIAGNOSIS — Z79899 Other long term (current) drug therapy: Secondary | ICD-10-CM | POA: Insufficient documentation

## 2023-01-27 DIAGNOSIS — Z7901 Long term (current) use of anticoagulants: Secondary | ICD-10-CM | POA: Insufficient documentation

## 2023-01-27 DIAGNOSIS — Z7902 Long term (current) use of antithrombotics/antiplatelets: Secondary | ICD-10-CM | POA: Insufficient documentation

## 2023-01-27 DIAGNOSIS — I10 Essential (primary) hypertension: Secondary | ICD-10-CM | POA: Diagnosis not present

## 2023-01-27 DIAGNOSIS — E1140 Type 2 diabetes mellitus with diabetic neuropathy, unspecified: Secondary | ICD-10-CM | POA: Insufficient documentation

## 2023-01-27 DIAGNOSIS — E1169 Type 2 diabetes mellitus with other specified complication: Secondary | ICD-10-CM | POA: Diagnosis present

## 2023-01-27 DIAGNOSIS — Z23 Encounter for immunization: Secondary | ICD-10-CM | POA: Diagnosis not present

## 2023-01-27 DIAGNOSIS — I6381 Other cerebral infarction due to occlusion or stenosis of small artery: Secondary | ICD-10-CM | POA: Diagnosis not present

## 2023-01-27 DIAGNOSIS — Z955 Presence of coronary angioplasty implant and graft: Secondary | ICD-10-CM | POA: Insufficient documentation

## 2023-01-27 DIAGNOSIS — I6389 Other cerebral infarction: Secondary | ICD-10-CM | POA: Diagnosis not present

## 2023-01-27 DIAGNOSIS — E1159 Type 2 diabetes mellitus with other circulatory complications: Secondary | ICD-10-CM | POA: Diagnosis not present

## 2023-01-27 DIAGNOSIS — E1139 Type 2 diabetes mellitus with other diabetic ophthalmic complication: Secondary | ICD-10-CM | POA: Insufficient documentation

## 2023-01-27 DIAGNOSIS — R2681 Unsteadiness on feet: Secondary | ICD-10-CM | POA: Diagnosis not present

## 2023-01-27 DIAGNOSIS — I480 Paroxysmal atrial fibrillation: Secondary | ICD-10-CM | POA: Diagnosis not present

## 2023-01-27 DIAGNOSIS — I639 Cerebral infarction, unspecified: Secondary | ICD-10-CM | POA: Diagnosis present

## 2023-01-27 DIAGNOSIS — R42 Dizziness and giddiness: Secondary | ICD-10-CM | POA: Diagnosis present

## 2023-01-27 DIAGNOSIS — I2510 Atherosclerotic heart disease of native coronary artery without angina pectoris: Secondary | ICD-10-CM | POA: Diagnosis not present

## 2023-01-27 DIAGNOSIS — R270 Ataxia, unspecified: Principal | ICD-10-CM

## 2023-01-27 DIAGNOSIS — E1165 Type 2 diabetes mellitus with hyperglycemia: Secondary | ICD-10-CM

## 2023-01-27 DIAGNOSIS — I152 Hypertension secondary to endocrine disorders: Secondary | ICD-10-CM

## 2023-01-27 LAB — HEMOGLOBIN A1C
Hgb A1c MFr Bld: 6.4 % — ABNORMAL HIGH (ref 4.8–5.6)
Mean Plasma Glucose: 136.98 mg/dL

## 2023-01-27 LAB — CBC WITH DIFFERENTIAL/PLATELET
Abs Immature Granulocytes: 0.03 10*3/uL (ref 0.00–0.07)
Basophils Absolute: 0.1 10*3/uL (ref 0.0–0.1)
Basophils Relative: 1 %
Eosinophils Absolute: 0.3 10*3/uL (ref 0.0–0.5)
Eosinophils Relative: 3 %
HCT: 41.9 % (ref 36.0–46.0)
Hemoglobin: 13.2 g/dL (ref 12.0–15.0)
Immature Granulocytes: 0 %
Lymphocytes Relative: 27 %
Lymphs Abs: 2.7 10*3/uL (ref 0.7–4.0)
MCH: 27.6 pg (ref 26.0–34.0)
MCHC: 31.5 g/dL (ref 30.0–36.0)
MCV: 87.5 fL (ref 80.0–100.0)
Monocytes Absolute: 0.5 10*3/uL (ref 0.1–1.0)
Monocytes Relative: 5 %
Neutro Abs: 6.3 10*3/uL (ref 1.7–7.7)
Neutrophils Relative %: 64 %
Platelets: 350 10*3/uL (ref 150–400)
RBC: 4.79 MIL/uL (ref 3.87–5.11)
RDW: 14.8 % (ref 11.5–15.5)
WBC: 9.9 10*3/uL (ref 4.0–10.5)
nRBC: 0 % (ref 0.0–0.2)

## 2023-01-27 LAB — LIPID PANEL
Cholesterol: 279 mg/dL — ABNORMAL HIGH (ref 0–200)
HDL: 66 mg/dL
LDL Cholesterol: 173 mg/dL — ABNORMAL HIGH (ref 0–99)
Total CHOL/HDL Ratio: 4.2 ratio
Triglycerides: 198 mg/dL — ABNORMAL HIGH
VLDL: 40 mg/dL (ref 0–40)

## 2023-01-27 LAB — I-STAT CHEM 8, ED
BUN: 29 mg/dL — ABNORMAL HIGH (ref 8–23)
Calcium, Ion: 1.23 mmol/L (ref 1.15–1.40)
Chloride: 109 mmol/L (ref 98–111)
Creatinine, Ser: 1.7 mg/dL — ABNORMAL HIGH (ref 0.44–1.00)
Glucose, Bld: 95 mg/dL (ref 70–99)
HCT: 44 % (ref 36.0–46.0)
Hemoglobin: 15 g/dL (ref 12.0–15.0)
Potassium: 3.9 mmol/L (ref 3.5–5.1)
Sodium: 143 mmol/L (ref 135–145)
TCO2: 22 mmol/L (ref 22–32)

## 2023-01-27 LAB — COMPREHENSIVE METABOLIC PANEL
ALT: 17 U/L (ref 0–44)
AST: 20 U/L (ref 15–41)
Albumin: 3.8 g/dL (ref 3.5–5.0)
Alkaline Phosphatase: 78 U/L (ref 38–126)
Anion gap: 10 (ref 5–15)
BUN: 25 mg/dL — ABNORMAL HIGH (ref 8–23)
CO2: 22 mmol/L (ref 22–32)
Calcium: 9.6 mg/dL (ref 8.9–10.3)
Chloride: 110 mmol/L (ref 98–111)
Creatinine, Ser: 1.67 mg/dL — ABNORMAL HIGH (ref 0.44–1.00)
GFR, Estimated: 32 mL/min — ABNORMAL LOW (ref >60)
Glucose, Bld: 95 mg/dL (ref 70–99)
Potassium: 4.2 mmol/L (ref 3.5–5.1)
Sodium: 142 mmol/L (ref 135–145)
Total Bilirubin: 0.3 mg/dL (ref <1.2)
Total Protein: 8 g/dL (ref 6.5–8.1)

## 2023-01-27 LAB — GLUCOSE, CAPILLARY: Glucose-Capillary: 81 mg/dL (ref 70–99)

## 2023-01-27 MED ORDER — ACETAMINOPHEN 650 MG RE SUPP: 650.0000 mg | Freq: Four times a day (QID) | RECTAL | Status: DC | PRN

## 2023-01-27 MED ORDER — INSULIN ASPART 100 UNIT/ML IJ SOLN: 0.0000 [IU] | Freq: Three times a day (TID) | INTRAMUSCULAR | Status: DC

## 2023-01-27 MED ORDER — SODIUM CHLORIDE 0.9 % IV SOLN: 250.0000 mL | INTRAVENOUS | Status: AC | PRN

## 2023-01-27 MED ORDER — ROSUVASTATIN CALCIUM 20 MG PO TABS
40.0000 mg | ORAL_TABLET | Freq: Every day | ORAL | Status: DC
  Administered 2023-01-28: 40 mg via ORAL
  Filled 2023-01-27: qty 2

## 2023-01-27 MED ORDER — STROKE: EARLY STAGES OF RECOVERY BOOK
Freq: Once | Status: AC
  Filled 2023-01-27: qty 1

## 2023-01-27 MED ORDER — SODIUM CHLORIDE 0.9% FLUSH: 3.0000 mL | INTRAVENOUS | Status: DC | PRN

## 2023-01-27 MED ORDER — SODIUM CHLORIDE 0.9% FLUSH
3.0000 mL | Freq: Two times a day (BID) | INTRAVENOUS | Status: DC
  Administered 2023-01-27 – 2023-01-28 (×2): 3 mL via INTRAVENOUS

## 2023-01-27 MED ORDER — MECLIZINE HCL 25 MG PO TABS
25.0000 mg | ORAL_TABLET | Freq: Once | ORAL | Status: AC
  Administered 2023-01-27: 25 mg via ORAL
  Filled 2023-01-27: qty 1

## 2023-01-27 MED ORDER — INFLUENZA VAC A&B SURF ANT ADJ 0.5 ML IM SUSY
0.5000 mL | PREFILLED_SYRINGE | INTRAMUSCULAR | Status: AC
  Administered 2023-01-28: 0.5 mL via INTRAMUSCULAR
  Filled 2023-01-27: qty 0.5

## 2023-01-27 MED ORDER — ACETAMINOPHEN 325 MG PO TABS: 650.0000 mg | ORAL_TABLET | Freq: Four times a day (QID) | ORAL | Status: DC | PRN

## 2023-01-27 MED ORDER — ENOXAPARIN SODIUM 40 MG/0.4ML IJ SOSY
40.0000 mg | PREFILLED_SYRINGE | INTRAMUSCULAR | Status: DC
  Administered 2023-01-28: 40 mg via SUBCUTANEOUS
  Filled 2023-01-27: qty 0.4

## 2023-01-27 MED ORDER — PREGABALIN 100 MG PO CAPS
100.0000 mg | ORAL_CAPSULE | Freq: Two times a day (BID) | ORAL | Status: DC
  Administered 2023-01-27 – 2023-01-28 (×2): 100 mg via ORAL
  Filled 2023-01-27 (×2): qty 1

## 2023-01-27 MED ORDER — CLOPIDOGREL BISULFATE 75 MG PO TABS
75.0000 mg | ORAL_TABLET | Freq: Every day | ORAL | Status: DC
  Administered 2023-01-27 – 2023-01-28 (×2): 75 mg via ORAL
  Filled 2023-01-27 (×2): qty 1

## 2023-01-27 MED ORDER — SODIUM CHLORIDE 0.9 % IV BOLUS
500.0000 mL | Freq: Once | INTRAVENOUS | Status: AC
  Administered 2023-01-27: 500 mL via INTRAVENOUS

## 2023-01-27 NOTE — Assessment & Plan Note (Addendum)
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  31.6% 10-year CVD risk via PREVENT score and has poor medication adherence.  No significant focal deficits on neurological exam, though did not observe patient ambulate and she is still reporting dizziness. Admit to inpatient, med-tele, attending Dr. McDiarmid Continuous telemetry monitoring Nursing: Vitals per unit routine, out of bed with assistance only, neuro checks q2 SLP, PT, OT eval and treat NPO until passes speech eval or bedside swallow Permissive HTN up to 220/120 x 24-48 hours Optimize risk factors as below: A1c 6.4, LDL 173 Follow up TTE Start clopidogrel 75 mg now, resume Eliquis 5 mg BID in next 48 hours (switch from Lovenox) Neuro stroke team consulted, appreciate recs: Imaging: MRA neck, Carotid Dopplers

## 2023-01-27 NOTE — ED Notes (Signed)
Was going to get labs and start IV, but patient not in room.
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
## 2023-01-27 NOTE — ED Provider Notes (Signed)
[place] EMERGENCY DEPARTMENT AT [facility] Provider Note   CSN: [id number] Arrival date & time: 01/27/23  1201     History {Add pertinent medical, surgical, social history, OB history to HPI:1} Chief Complaint  Patient presents with   Dizziness    Heather [name] is a 72 y.o. female.  72 year old female with prior medical history as detailed below presents for evaluation.  Patient reports onset of significant dizziness/unsteadiness this morning.  She reports that she woke with her symptoms.  She did have a similar episode 1 week ago.  Symptoms resolved at that time.  Symptoms were present this morning when she awoke.  She went to work.  She felt unsteady at work and had trouble performing her activities at work.  She decided to come to the ED for evaluation.  She reports that with walking she feels like she is going to fall over.  She feels better with holding onto furniture or other items to keep her self upright.  She denies focal weakness.  She denies recent fever, illness, headache, vision change, focal weakness.  Additionally patient with multiple medications previously prescribed.  She reports that she stopped taking these approximately 2 months ago.  She decided last night that she would take some of her medications including her antihypertensive and her Eliquis.  The history is provided by the patient and medical records.       Home Medications Prior to Admission medications   Medication Sig Start Date End Date Taking? Authorizing Provider  amLODipine (NORVASC) 10 MG tablet Take 1 tablet (10 mg total) by mouth at bedtime. Patient not taking: Reported on 11/26/2022 08/22/22   McDiarmid, Leighton Roach, MD  apixaban (ELIQUIS) 5 MG TABS tablet Take 1 tablet (5 mg total) by mouth 2 (two) times daily. 08/22/22   McDiarmid, Leighton Roach, MD  blood glucose meter kit and supplies KIT Dispense based on patient and insurance preference. Use up to four times daily as directed. Patient not  taking: Reported on 09/12/2021 04/26/20   Leeroy Bock, MD  carvedilol (COREG) 3.125 MG tablet Take 1 tablet (3.125 mg total) by mouth 2 (two) times daily with a meal. Patient not taking: Reported on 11/26/2022 10/17/22   McDiarmid, Leighton Roach, MD  cloNIDine (CATAPRES - DOSED IN MG/24 HR) 0.1 mg/24hr patch Place 1 patch (0.1 mg total) onto the skin once a week. 11/07/22   McDiarmid, Leighton Roach, MD  clopidogrel (PLAVIX) 75 MG tablet TAKE 1 TABLET BY MOUTH EVERY DAY WITH BREAKFAST Patient not taking: Reported on 08/22/2022 06/26/22   Corky Crafts, MD  diclofenac Sodium (VOLTAREN) 1 % GEL Apply 2 g topically 4 (four) times daily. Patient not taking: Reported on 11/26/2022 11/07/22   McDiarmid, Leighton Roach, MD  DULoxetine (CYMBALTA) 60 MG capsule Take 1 capsule (60 mg total) by mouth daily. Patient not taking: Reported on 05/09/2022 05/09/22   McDiarmid, Leighton Roach, MD  empagliflozin (JARDIANCE) 25 MG TABS tablet Take 1 tablet (25 mg total) by mouth daily. Patient not taking: Reported on 08/22/2022 06/26/22   Corky Crafts, MD  Evolocumab (REPATHA SURECLICK) 140 MG/ML SOAJ Inject 140 mg into the skin every 14 (fourteen) days. Patient not taking: Reported on 08/22/2022 06/26/22   Corky Crafts, MD  Lancets [facility] DELICA PLUS [place]) MISC USE AS DIRECTED to check sugars once daily Patient not taking: Reported on 09/12/2021 01/29/21   Carney Living, MD  Needles & Syringes MISC 1 Container by Does not  apply route daily. Patient not taking: Reported on 05/09/2022 03/29/22   Sabino Dick, DO  nitroGLYCERIN (NITROSTAT) 0.4 MG SL tablet Place 1 tablet (0.4 mg total) under the tongue every 5 (five) minutes as needed for chest pain. Patient not taking: Reported on 11/26/2022 10/17/22   McDiarmid, Leighton Roach, MD  prednisoLONE acetate (PRED FORTE) 1 % ophthalmic suspension Place 1 drop into the right eye 4 (four) times daily. Patient not taking: Reported on 09/12/2021 06/19/21   [provider]   pregabalin (LYRICA) 100 MG capsule Take 1 capsule (100 mg total) by mouth 2 (two) times daily. 11/20/22   Candelaria Stagers, DPM  rosuvastatin (CRESTOR) 40 MG tablet Take 1 tablet (40 mg total) by mouth daily. Patient not taking: Reported on 10/17/2022 09/19/22   McDiarmid, Leighton Roach, MD  Semaglutide,0.25 or 0.5MG /DOS, 2 MG/3ML SOPN Inject 0.5 mg into the skin once a week. 09/26/22   McDiarmid, Leighton Roach, MD      Allergies    Patient has no known allergies.    Review of Systems   Review of Systems  All other systems reviewed and are negative.   Physical Exam Updated Vital Signs BP (!) 175/73   Pulse 64   Temp (!) 97.5 F (36.4 C) (Oral)   Resp (!) 22   Ht 5\' 7"  (1.702 m)   Wt 87.7 kg   SpO2 100%   BMI 30.28 kg/m  Physical Exam Vitals and nursing note reviewed.  Constitutional:      General: She is not in acute distress.    Appearance: Normal appearance. She is well-developed.  HENT:     Head: Normocephalic and atraumatic.  Eyes:     Conjunctiva/sclera: Conjunctivae normal.     Pupils: Pupils are equal, round, and reactive to light.  Cardiovascular:     Rate and Rhythm: Normal rate and regular rhythm.     Heart sounds: Normal heart sounds.  Pulmonary:     Effort: Pulmonary effort is normal. No respiratory distress.     Breath sounds: Normal breath sounds.  Abdominal:     General: There is no distension.     Palpations: Abdomen is soft.     Tenderness: There is no abdominal tenderness.  Musculoskeletal:        General: No deformity. Normal range of motion.     Cervical back: Normal range of motion and neck supple.  Skin:    General: Skin is warm and dry.  Neurological:     General: No focal deficit present.     Mental Status: She is alert and oriented to person, place, and time.     Comments: Alert, oriented x 4, no facial droop, normal speech, 5 out of 5 strength in both upper and lower extremities.  Reports significant ataxia with attempted ambulation.     ED Results /  Procedures / Treatments   Labs (all labs ordered are listed, but only abnormal results are displayed) Labs Reviewed  CBC WITH DIFFERENTIAL/PLATELET  COMPREHENSIVE METABOLIC PANEL  I-STAT CHEM 8, ED    EKG EKG Interpretation Date/Time:  Monday January 27 2023 12:09:22 EST Ventricular Rate:  67 PR Interval:  156 QRS Duration:  78 QT Interval:  404 QTC Calculation: 427 R Axis:   25  Text Interpretation: Sinus rhythm Abnormal R-wave progression, early transition Confirmed by Kristine Royal [phone]) on 01/27/2023 12:40:54 PM  Radiology No results found.  Procedures Procedures  {Document cardiac monitor, telemetry assessment procedure when appropriate:1}  Medications Ordered in ED  Medications  sodium chloride 0.9 % bolus 500 mL (has no administration in time range)  meclizine (ANTIVERT) tablet 25 mg (has no administration in time range)    ED Course/ Medical Decision Making/ A&P   {   Click here for ABCD2, HEART and other calculatorsREFRESH Note before signing :1}                              Medical Decision Making Amount and/or Complexity of Data Reviewed Labs: ordered. Radiology: ordered.    Medical Screen Complete  This patient presented to the ED with complaint of ataxia.  This complaint involves an extensive number of treatment options. The initial differential diagnosis includes, but is not limited to, CVA, vertigo, metabolic abnormality, etc.  This presentation is: Acute, Self-Limited, Previously Undiagnosed, Uncertain Prognosis, Complicated, Systemic Symptoms, and Threat to Life/Bodily Function    Co morbidities that complicated the patient's evaluation  ***   Additional history obtained:  Additional history obtained from {History source:19196::"EMS","Spouse","Family","Friend","Caregiver"} External records from outside sources obtained and reviewed including prior ED visits and prior Inpatient records.    Lab Tests:  I ordered and personally  interpreted labs.  The pertinent results include:  ***   Imaging Studies ordered:  I ordered imaging studies including ***  I independently visualized and interpreted obtained imaging which showed *** I agree with the radiologist interpretation.   Cardiac Monitoring:  The patient was maintained on a cardiac monitor.  I personally viewed and interpreted the cardiac monitor which showed an underlying rhythm of: ***   Medicines ordered:  I ordered medication including ***  for ***  Reevaluation of the patient after these medicines showed that the patient: {resolved/improved/worsened:23923::"improved"}    Test Considered:  ***   Critical Interventions:  ***   Consultations Obtained:  I consulted ***,  and discussed lab and imaging findings as well as pertinent plan of care.    Problem List / ED Course:  ***   Reevaluation:  After the interventions noted above, I reevaluated the patient and found that they have: {resolved/improved/worsened:23923::"improved"}   Social Determinants of Health:  ***   Disposition:  After consideration of the diagnostic results and the patients response to treatment, I feel that the patent would benefit from ***.    {Document critical care time when appropriate:1} {Document review of labs and clinical decision tools ie heart score, Chads2Vasc2 etc:1}  {Document your independent review of radiology images, and any outside records:1} {Document your discussion with family members, caretakers, and with consultants:1} {Document social determinants of health affecting pt's care:1} {Document your decision making why or why not admission, treatments were needed:1} Final Clinical Impression(s) / ED Diagnoses Final diagnoses:  None    Rx / DC Orders ED Discharge Orders     None

## 2023-01-27 NOTE — Plan of Care (Signed)
FMTS Brief Progress Note  S: Patient resting comfortably in bed on exam this evening.  She reports no current pain or discomfort.  No dizziness at rest.  O: BP (!) 197/86   Pulse 72   Temp 97.7 F (36.5 C) (Oral)   Resp 20   Ht 5\' 7"  (1.702 m)   Wt 87.7 kg   SpO2 100%   BMI 30.28 kg/m   General: Alert and oriented, no apparent distress Cardiac: RRR, no M/R/G Respiratory: CTAB, no increased work of breathing Neuro: Moves all 4 limbs spontaneously, intact strength bilaterally  A/P: Patient is currently stable.  Continue with current plan including SLP, PT OT eval in the morning.  Patient passed bedside swallow test can have diet.  Permissive hypertension continued for 24 to 48 hours. - Orders reviewed. Labs for AM ordered, which was adjusted as needed.  - If condition changes, plan includes rapid reassessment.   Gerrit Heck, DO 01/27/2023, 7:33 PM PGY-1, [facility] Night Resident  Please page [phone] with questions.
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
## 2023-01-27 NOTE — ED Provider Notes (Incomplete)
[place] EMERGENCY DEPARTMENT AT [facility] Provider Note   CSN: [id number] Arrival date & time: 01/27/23  1201     History {Add pertinent medical, surgical, social history, OB history to HPI:1} Chief Complaint  Patient presents with   Dizziness    Heather [name] is a 72 y.o. female.   Dizziness      Home Medications Prior to Admission medications   Medication Sig Start Date End Date Taking? Authorizing Provider  amLODipine (NORVASC) 10 MG tablet Take 1 tablet (10 mg total) by mouth at bedtime. Patient not taking: Reported on 11/26/2022 08/22/22   McDiarmid, Leighton Roach, MD  apixaban (ELIQUIS) 5 MG TABS tablet Take 1 tablet (5 mg total) by mouth 2 (two) times daily. 08/22/22   McDiarmid, Leighton Roach, MD  blood glucose meter kit and supplies KIT Dispense based on patient and insurance preference. Use up to four times daily as directed. Patient not taking: Reported on 09/12/2021 04/26/20   Leeroy Bock, MD  carvedilol (COREG) 3.125 MG tablet Take 1 tablet (3.125 mg total) by mouth 2 (two) times daily with a meal. Patient not taking: Reported on 11/26/2022 10/17/22   McDiarmid, Leighton Roach, MD  cloNIDine (CATAPRES - DOSED IN MG/24 HR) 0.1 mg/24hr patch Place 1 patch (0.1 mg total) onto the skin once a week. 11/07/22   McDiarmid, Leighton Roach, MD  clopidogrel (PLAVIX) 75 MG tablet TAKE 1 TABLET BY MOUTH EVERY DAY WITH BREAKFAST Patient not taking: Reported on 08/22/2022 06/26/22   Corky Crafts, MD  diclofenac Sodium (VOLTAREN) 1 % GEL Apply 2 g topically 4 (four) times daily. Patient not taking: Reported on 11/26/2022 11/07/22   McDiarmid, Leighton Roach, MD  DULoxetine (CYMBALTA) 60 MG capsule Take 1 capsule (60 mg total) by mouth daily. Patient not taking: Reported on 05/09/2022 05/09/22   McDiarmid, Leighton Roach, MD  empagliflozin (JARDIANCE) 25 MG TABS tablet Take 1 tablet (25 mg total) by mouth daily. Patient not taking: Reported on 08/22/2022 06/26/22   Corky Crafts, MD  Evolocumab (REPATHA  SURECLICK) 140 MG/ML SOAJ Inject 140 mg into the skin every 14 (fourteen) days. Patient not taking: Reported on 08/22/2022 06/26/22   Corky Crafts, MD  Lancets [facility] DELICA PLUS Bells) MISC USE AS DIRECTED to check sugars once daily Patient not taking: Reported on 09/12/2021 01/29/21   Carney Living, MD  Needles & Syringes MISC 1 Container by Does not apply route daily. Patient not taking: Reported on 05/09/2022 03/29/22   Sabino Dick, DO  nitroGLYCERIN (NITROSTAT) 0.4 MG SL tablet Place 1 tablet (0.4 mg total) under the tongue every 5 (five) minutes as needed for chest pain. Patient not taking: Reported on 11/26/2022 10/17/22   McDiarmid, Leighton Roach, MD  prednisoLONE acetate (PRED FORTE) 1 % ophthalmic suspension Place 1 drop into the right eye 4 (four) times daily. Patient not taking: Reported on 09/12/2021 06/19/21   [provider]  pregabalin (LYRICA) 100 MG capsule Take 1 capsule (100 mg total) by mouth 2 (two) times daily. 11/20/22   Candelaria Stagers, DPM  rosuvastatin (CRESTOR) 40 MG tablet Take 1 tablet (40 mg total) by mouth daily. Patient not taking: Reported on 10/17/2022 09/19/22   McDiarmid, Leighton Roach, MD  Semaglutide,0.25 or 0.5MG /DOS, 2 MG/3ML SOPN Inject 0.5 mg into the skin once a week. 09/26/22   McDiarmid, Leighton Roach, MD      Allergies    Patient has no known allergies.    Review of Systems  Review of Systems  Neurological:  Positive for dizziness.    Physical Exam Updated Vital Signs BP (!) 193/91   Pulse 68   Resp 12   Ht 5\' 7"  (1.702 m)   Wt 87.7 kg   SpO2 100%   BMI 30.28 kg/m  Physical Exam  ED Results / Procedures / Treatments   Labs (all labs ordered are listed, but only abnormal results are displayed) Labs Reviewed - No data to display  EKG None  Radiology No results found.  Procedures Procedures  {Document cardiac monitor, telemetry assessment procedure when appropriate:1}  Medications Ordered in ED Medications - No data to  display  ED Course/ Medical Decision Making/ A&P   {   Click here for ABCD2, HEART and other calculatorsREFRESH Note before signing :1}                              Medical Decision Making  ***  {Document critical care time when appropriate:1} {Document review of labs and clinical decision tools ie heart score, Chads2Vasc2 etc:1}  {Document your independent review of radiology images, and any outside records:1} {Document your discussion with family members, caretakers, and with consultants:1} {Document social determinants of health affecting pt's care:1} {Document your decision making why or why not admission, treatments were needed:1} Final Clinical Impression(s) / ED Diagnoses Final diagnoses:  None    Rx / DC Orders ED Discharge Orders     None

## 2023-01-27 NOTE — Progress Notes (Signed)
Patient ID: Heather [name], female   DOB: March 18, 1950, 72 y.o.   MRN: [id number]  I have seen and examined this patient, reviewed their chart. I have discussed this patient with the resident. I agree with the resident's findings, assessment and care plan.   Principal Problem:   Thalamic stroke (HCC) Active Problems:   Hypertension associated with diabetes (HCC)   Type 2 diabetes mellitus with ophthalmic complication (HCC)   Hyperlipidemia associated with type 2 diabetes mellitus (HCC)   General unsteadiness   Hold Apixaban for 48 hours Start Plavix - Stop when Apixaban started.  Transthoracic echocardiogram pending Start home antihypertensive medications in 24-48 hours Encourage patient to restart her Repatha

## 2023-01-27 NOTE — Assessment & Plan Note (Addendum)
Only has carvedilol 3.125 mg twice daily on hand, not aware of any other meds and unclear what actual goal therapy is.  Currently in permissive hypertension window. -Permissive HTN up to 220/120 x 24-48 hours, then restart below oral anti-hypertensives -Hold carvedilol 3.125 mg twice daily, amlodipine 10 mg daily, clonidine 0.5 mg 24-hour patch weekly

## 2023-01-27 NOTE — ED Notes (Signed)
Pt went to MRI.

## 2023-01-27 NOTE — ED Triage Notes (Signed)
Woke up this AM with difficulty walking and dizziness. LKW at 2100 last night. Sent by [facility]. Handwriting and hand coordination were off when pt was at work this AM. Dizziness that lasted for an hour on Monday. Fell on Wednesday and hit back of head. Prescribed Eliquis but has not taken until last night.

## 2023-01-27 NOTE — Assessment & Plan Note (Addendum)
A1c 6.4 today, goal <7.  Has been on semaglutide until 1 week ago when she ran out, not adherent with any other medicines. -CBGs ACHS -Sensitive SSI -Hold Jardiance 5 mg daily

## 2023-01-27 NOTE — Assessment & Plan Note (Signed)
LDL 173 today, goal <70.  Unclear why patient discontinued statin and Repatha, will benefit from outpatient follow-up. -Restart previous dose rosuvastatin 40 mg -Encourage to restart Repatha outpatient

## 2023-01-28 ENCOUNTER — Inpatient Hospital Stay (HOSPITAL_BASED_OUTPATIENT_CLINIC_OR_DEPARTMENT_OTHER): Payer: Medicare HMO

## 2023-01-28 ENCOUNTER — Other Ambulatory Visit (HOSPITAL_COMMUNITY): Payer: Self-pay

## 2023-01-28 DIAGNOSIS — I6389 Other cerebral infarction: Secondary | ICD-10-CM

## 2023-01-28 DIAGNOSIS — I639 Cerebral infarction, unspecified: Secondary | ICD-10-CM | POA: Diagnosis not present

## 2023-01-28 DIAGNOSIS — I6381 Other cerebral infarction due to occlusion or stenosis of small artery: Secondary | ICD-10-CM | POA: Diagnosis not present

## 2023-01-28 LAB — GLUCOSE, CAPILLARY
Glucose-Capillary: 85 mg/dL (ref 70–99)
Glucose-Capillary: 115 mg/dL — ABNORMAL HIGH (ref 70–99)

## 2023-01-28 LAB — CBC
HCT: 33.4 % — ABNORMAL LOW (ref 36.0–46.0)
Hemoglobin: 10.4 g/dL — ABNORMAL LOW (ref 12.0–15.0)
MCH: 26.9 pg (ref 26.0–34.0)
MCHC: 31.1 g/dL (ref 30.0–36.0)
MCV: 86.5 fL (ref 80.0–100.0)
Platelets: 260 10*3/uL (ref 150–400)
RBC: 3.86 MIL/uL — ABNORMAL LOW (ref 3.87–5.11)
RDW: 14.9 % (ref 11.5–15.5)
WBC: 6.5 10*3/uL (ref 4.0–10.5)
nRBC: 0 % (ref 0.0–0.2)

## 2023-01-28 LAB — ECHOCARDIOGRAM COMPLETE
AR max vel: 1.32 cm2
AV Area VTI: 1.41 cm2
AV Area mean vel: 1.38 cm2
AV Mean grad: 10 mm[Hg]
AV Peak grad: 18.8 mm[Hg]
Ao pk vel: 2.17 m/s
Area-P 1/2: 2.55 cm2
Height: 67 in
S' Lateral: 2.8 cm
Weight: 3093.49 [oz_av]

## 2023-01-28 LAB — BASIC METABOLIC PANEL
Anion gap: 8 (ref 5–15)
BUN: 24 mg/dL — ABNORMAL HIGH (ref 8–23)
CO2: 21 mmol/L — ABNORMAL LOW (ref 22–32)
Calcium: 8.7 mg/dL — ABNORMAL LOW (ref 8.9–10.3)
Chloride: 112 mmol/L — ABNORMAL HIGH (ref 98–111)
Creatinine, Ser: 1.51 mg/dL — ABNORMAL HIGH (ref 0.44–1.00)
GFR, Estimated: 37 mL/min — ABNORMAL LOW (ref >60)
Glucose, Bld: 90 mg/dL (ref 70–99)
Potassium: 3.9 mmol/L (ref 3.5–5.1)
Sodium: 141 mmol/L (ref 135–145)

## 2023-01-28 LAB — MAGNESIUM: Magnesium: 1.8 mg/dL (ref 1.7–2.4)

## 2023-01-28 MED ORDER — CLOPIDOGREL BISULFATE 75 MG PO TABS: 75.0000 mg | ORAL_TABLET | Freq: Every day | ORAL | Status: DC

## 2023-01-28 MED ORDER — SEMAGLUTIDE(0.25 OR 0.5MG/DOS) 2 MG/3ML ~~LOC~~ SOPN
0.5000 mg | PEN_INJECTOR | SUBCUTANEOUS | 0 refills | Status: DC
  Filled 2023-01-28: qty 3, 28d supply, fill #0

## 2023-01-28 MED ORDER — APIXABAN 5 MG PO TABS
5.0000 mg | ORAL_TABLET | Freq: Two times a day (BID) | ORAL | 0 refills | Status: DC
  Filled 2023-01-28: qty 60, 30d supply, fill #0

## 2023-01-28 MED ORDER — ATORVASTATIN CALCIUM 80 MG PO TABS
80.0000 mg | ORAL_TABLET | Freq: Every day | ORAL | 0 refills | Status: DC
  Filled 2023-01-28: qty 30, 30d supply, fill #0

## 2023-01-28 MED ORDER — ATORVASTATIN CALCIUM 80 MG PO TABS: 80.0000 mg | ORAL_TABLET | Freq: Every day | ORAL | Status: DC

## 2023-01-28 MED ORDER — APIXABAN 5 MG PO TABS: 5.0000 mg | ORAL_TABLET | Freq: Two times a day (BID) | ORAL | Status: DC

## 2023-01-28 MED ORDER — CLOPIDOGREL BISULFATE 75 MG PO TABS
75.0000 mg | ORAL_TABLET | Freq: Every day | ORAL | 0 refills | Status: DC
  Filled 2023-01-28: qty 30, 30d supply, fill #0

## 2023-01-28 NOTE — Progress Notes (Addendum)
STROKE TEAM PROGRESS NOTE   BRIEF HPI Heather [name] is a 72 y.o. female with PMH significant for diabetes, hyperlipidemia, hypertension, MI 2022, PCI with DES to Lcx, peripheral neuropathy, atrial fibrillation, on Eliquis and Plavix at home with recent noncompliance with Eliquis who presented 12/2 with dizziness and imbalance x 1 day and multiple episodes of feeling off over the last week.  NIH on Admission: 1 for right upper extremity mild ataxia  INTERIM HISTORY/SUBJECTIVE  Speaking with patient today she admitted that she stopped taking her Eliquis at home.  Said she had no specific reason for it and she was okay with restarting it. On exam patient had some bilateral tingling in her fingers and stated she had some dizziness when walking earlier today.  Oriented with no aphasia or dysarthria present, no focal weakness seen.    Patient educated on secondary stroke prevention and follow-up as needed.  OBJECTIVE  CBC    Component Value Date/Time   WBC 6.5 01/28/2023 0537   RBC 3.86 (L) 01/28/2023 0537   HGB 10.4 (L) 01/28/2023 0537   HGB 11.7 07/13/2021 1004   HCT 33.4 (L) 01/28/2023 0537   HCT 35.6 07/13/2021 1004   PLT 260 01/28/2023 0537   PLT 302 07/13/2021 1004   MCV 86.5 01/28/2023 0537   MCV 83 07/13/2021 1004   MCH 26.9 01/28/2023 0537   MCHC 31.1 01/28/2023 0537   RDW 14.9 01/28/2023 0537   RDW 14.6 07/13/2021 1004   LYMPHSABS 2.7 01/27/2023 1421   MONOABS 0.5 01/27/2023 1421   EOSABS 0.3 01/27/2023 1421   BASOSABS 0.1 01/27/2023 1421    BMET    Component Value Date/Time   NA 141 01/28/2023 0537   NA 141 04/09/2022 1658   K 3.9 01/28/2023 0537   CL 112 (H) 01/28/2023 0537   CO2 21 (L) 01/28/2023 0537   GLUCOSE 90 01/28/2023 0537   BUN 24 (H) 01/28/2023 0537   BUN 31 (H) 04/09/2022 1658   CREATININE 1.51 (H) 01/28/2023 0537   CALCIUM 8.7 (L) 01/28/2023 0537   EGFR 29 (L) 04/09/2022 1658   GFRNONAA 37 (L) 01/28/2023 0537    IMAGING past 24  hours ECHOCARDIOGRAM COMPLETE  Result Date: 01/28/2023    ECHOCARDIOGRAM REPORT   Patient Name:   Heather [name] Date of Exam: 01/28/2023 Medical Rec #:  [id number]     Height:       67.0 in Accession #:    [id number]    Weight:       193.3 lb Date of Birth:  28-Jun-1950      BSA:          1.993 m Patient Age:    72 years      BP:           144/62 mmHg Patient Gender: F             HR:           60 bpm. Exam Location:  Inpatient Procedure: 2D Echo, 3D Echo, Cardiac Doppler, Color Doppler, Strain Analysis and            Saline Contrast Bubble Study Indications:    Stroke  History:        Patient has prior history of Echocardiogram examinations, most                 recent 05/18/2020. Risk Factors:Hypertension, Diabetes,  Dyslipidemia and Former Smoker.  Sonographer:    Karma Ganja Referring Phys: [id number] TODD D MCDIARMID  Sonographer Comments: Global longitudinal strain was attempted. IMPRESSIONS  1. Left ventricular ejection fraction, by estimation, is 55 to 60%. The left ventricle has normal function. The left ventricle has no regional wall motion abnormalities. Left ventricular diastolic function could not be evaluated.  2. Right ventricular systolic function is normal. The right ventricular size is mildly enlarged. There is mildly elevated pulmonary artery systolic pressure.  3. Left atrial size was mildly dilated.  4. The mitral valve is degenerative. No evidence of mitral valve regurgitation. Severe mitral annular calcification.  5. The aortic valve is tricuspid. There is mild calcification of the aortic valve. There is mild thickening of the aortic valve. Aortic valve regurgitation is not visualized. Aortic valve sclerosis/calcification is present, without any evidence of aortic stenosis.  6. Aortic dilatation noted. There is mild dilatation of the aortic arch, measuring 35 mm.  7. Agitated saline contrast bubble study was negative, with no evidence of any interatrial shunt. Comparison(s): No  significant change from prior study. FINDINGS  Left Ventricle: Left ventricular ejection fraction, by estimation, is 55 to 60%. The left ventricle has normal function. The left ventricle has no regional wall motion abnormalities. Global longitudinal strain performed but not reported based on interpreter judgement due to suboptimal tracking. 3D ejection fraction reviewed and evaluated as part of the interpretation. Alternate measurement of EF is felt to be most reflective of LV function. The left ventricular internal cavity size was normal in  size. There is no left ventricular hypertrophy. Left ventricular diastolic function could not be evaluated due to mitral annular calcification (moderate or greater). Left ventricular diastolic function could not be evaluated. Right Ventricle: The right ventricular size is mildly enlarged. No increase in right ventricular wall thickness. Right ventricular systolic function is normal. There is mildly elevated pulmonary artery systolic pressure. The tricuspid regurgitant velocity is 3.18 m/s, and with an assumed right atrial pressure of 3 mmHg, the estimated right ventricular systolic pressure is 43.4 mmHg. Left Atrium: Left atrial size was mildly dilated. Right Atrium: Right atrial size was normal in size. Pericardium: Trivial pericardial effusion is present. The pericardial effusion is lateral to the left ventricle. Mitral Valve: The mitral valve is degenerative in appearance. Severe mitral annular calcification. No evidence of mitral valve regurgitation. The mean mitral valve gradient is 2.5 mmHg with average heart rate of 71 bpm. Tricuspid Valve: The tricuspid valve is normal in structure. Tricuspid valve regurgitation is trivial. Aortic Valve: The aortic valve is tricuspid. There is mild calcification of the aortic valve. There is mild thickening of the aortic valve. There is mild to moderate aortic valve annular calcification. Aortic valve regurgitation is not visualized.  Aortic  valve sclerosis/calcification is present, without any evidence of aortic stenosis. Aortic valve mean gradient measures 10.0 mmHg. Aortic valve peak gradient measures 18.8 mmHg. Aortic valve area, by VTI measures 1.41 cm. Pulmonic Valve: The pulmonic valve was normal in structure. Pulmonic valve regurgitation is not visualized. No evidence of pulmonic stenosis. Aorta: The aortic root and ascending aorta are structurally normal, with no evidence of dilitation and aortic dilatation noted. There is mild dilatation of the aortic arch, measuring 35 mm. IAS/Shunts: The atrial septum is grossly normal. Agitated saline contrast was given intravenously to evaluate for intracardiac shunting. Agitated saline contrast bubble study was negative, with no evidence of any interatrial shunt.  LEFT VENTRICLE PLAX 2D LVIDd:  4.10 cm   Diastology LVIDs:         2.80 cm   LV e' medial:    4.13 cm/s LV PW:         0.90 cm   LV E/e' medial:  20.4 LV IVS:        0.90 cm   LV e' lateral:   5.77 cm/s LVOT diam:     1.90 cm   LV E/e' lateral: 14.6 LV SV:         67 LV SV Index:   34 LVOT Area:     2.84 cm                           3D Volume EF:                          3D EF:        53 %                          LV EDV:       165 ml                          LV ESV:       77 ml                          LV SV:        88 ml RIGHT VENTRICLE RV Basal diam:  4.00 cm RV S prime:     11.30 cm/s TAPSE (M-mode): 3.3 cm LEFT ATRIUM             Index        RIGHT ATRIUM           Index LA diam:        4.60 cm 2.31 cm/m   RA Area:     16.80 cm LA Vol (A2C):   75.6 ml 37.93 ml/m  RA Volume:   46.20 ml  23.18 ml/m LA Vol (A4C):   67.4 ml 33.81 ml/m LA Biplane Vol: 72.8 ml 36.52 ml/m  AORTIC VALVE AV Area (Vmax):    1.32 cm AV Area (Vmean):   1.38 cm AV Area (VTI):     1.41 cm AV Vmax:           217.00 cm/s AV Vmean:          141.000 cm/s AV VTI:            0.473 m AV Peak Grad:      18.8 mmHg AV Mean Grad:      10.0 mmHg LVOT Vmax:          101.00 cm/s LVOT Vmean:        68.400 cm/s LVOT VTI:          0.236 m LVOT/AV VTI ratio: 0.50  AORTA Ao Root diam: 2.80 cm Ao Asc diam:  2.70 cm MITRAL VALVE                TRICUSPID VALVE MV Area (PHT): 2.55 cm     TR Peak grad:   40.4 mmHg MV Mean grad:  2.5 mmHg     TR Vmax:        318.00 cm/s MV Decel Time: 298 msec MV E velocity: 84.40 cm/s  SHUNTS MV A velocity: 148.00 cm/s  Systemic VTI:  0.24 m MV E/A ratio:  0.57         Systemic Diam: 1.90 cm Riley Lam MD Electronically signed by Riley Lam MD Signature Date/Time: 01/28/2023/12:11:40 PM    Final    MR ANGIO HEAD WO CONTRAST  Result Date: 01/27/2023 CLINICAL DATA:  Stroke/TIA, determine embolic source. EXAM: MRA HEAD WITHOUT CONTRAST TECHNIQUE: Angiographic images of the Circle of Willis were acquired using MRA technique without intravenous contrast. COMPARISON:  Brain MRI from earlier today FINDINGS: Anterior circulation: Major vessels are sufficiently patent with overall mild atheromatous irregularity. No proximal flow reducing stenosis. Negative for aneurysm or vascular malformation. Posterior circulation: Left dominant vertebral artery. Prominent stenosis at the right vertebrobasilar junction. Small vertebral and basilar arteries in the setting of fetal type PCA flow. Basilar is smoothly contoured and diffusely patent. Atheromatous irregularity of the posterior cerebral arteries with a high-grade narrowing of the upper branch left PCA causing flow gap. Anatomic variants: As above IMPRESSION: 1. No emergent finding. 2. Intracranial atherosclerosis as described. No proximal or correctable flow reducing stenosis. Electronically Signed   By: Tiburcio Pea M.D.   On: 01/27/2023 19:24    Vitals:   01/28/23 0007 01/28/23 0331 01/28/23 0855 01/28/23 1132  BP: 128/69 (!) 144/62 (!) 157/78 (!) 154/72  Pulse: 62 62 62 66  Resp: 16 16  17   Temp: 97.8 F (36.6 C) 98.4 F (36.9 C) 97.8 F (36.6 C) 97.8 F (36.6 C)   TempSrc: Oral Oral Oral Oral  SpO2: 96% 99% 100% 99%  Weight:      Height:         PHYSICAL EXAM General: Alert and well-nourished patient, NAD CV: Regular rate and rhythm on monitor Respiratory:  Regular, unlabored respirations on room air  NEURO:  Mental Status: AA&Ox3, patient is able to give clear and coherent history Speech/Language: speech is without dysarthria or aphasia.  Naming, repetition, fluency, and comprehension intact.  Cranial Nerves:  II: PERRL. Visual fields full.  III, IV, VI: EOMI. Eyelids elevate symmetrically.  V: Sensation is intact to light touch and symmetrical to face.  VII: Face is symmetrical resting and smiling VIII: hearing intact to voice. IX, X: Palate elevates symmetrically. Phonation is normal.  ZO:[id number] shrug 5/5. XII: tongue is midline without fasciculations. Motor: 5/5 strength to all muscle groups tested.  Tone: is normal and bulk is normal Sensation- Intact to light touch bilaterally. Extinction absent to light touch to DSS.  Intermittent tingling and numbness reported to bilateral fingers Coordination: Mild right finger-to-nose ataxia.  HKS: no ataxia in BLE.No drift.  Gait- deferred  Most Recent NIH: 1     ASSESSMENT/PLAN  Acute Ischemic Infarct:  left thalamus  Etiology: In the setting of atrial fibrillation, noncompliance with Eliquis anticoagulation  MRI Brain(Personally reviewed): Acute infarct in the left thalamus. No hemorrhage.  MR Angio head without contrast and Carotid Duplex BL(Personally reviewed): No emergent finding. Intracranial atherosclerosis as described. No proximal or correctable flow reducing stenosis [right vertebrobasilar junction, as well as left Carotid Doppler: <39% stenosis bilateral ICAs. Left ECA appears >50% stenosed. Bilateral subclavians with normal flow.  2D Echo: LVEF 55 to 60%, no LVH, mildly dilated left atria, no shunt LDL 173 HgbA1c 6.4 VTE prophylaxis - eliquis clopidogrel 75 mg daily  prior to admission, now on  eliquis 5mg  BID  Patient had recently stopped taking Eliquis at home, no indicated reason, educated and willing to restart.  Therapy recommendations:  Outpatient  PT/OT/ST Disposition:  home  Atrial fibrillation Home Meds:  Continue telemetry monitoring Resume anticoagulation with Eliquis  Hypertension Home meds: Amlodipine 10 mg, carvedilol 3.125 mg, clonidine patch Stable BP goal normotensive as she is outside of the permissive hypertension window due to symptoms presenting for multiple days  Hyperlipidemia Home meds: Crestor 40 mg LDL 173, goal < 70 Change to Lipitor 80 mg Continue statin at discharge  Diabetes type II Uncontrolled Home meds: Jardiance, semaglutide, insulin HgbA1c 6.4, goal < 7.0 CBGs SSI Recommend close follow-up with PCP for better DM control  Tobacco Abuse Former cigarette smoker  Other Stroke Risk Factors ETOH use, advised to drink no more than 1-2 drink(s) a day Obesity, Body mass index is 30.28 kg/m., BMI >/= 30 associated with increased stroke risk, recommend weight loss, diet and exercise as appropriate  Family hx stroke (mother)  Other Active Problems History of medicine noncompliance Educated on importance of medication, specifically those for secondary stroke prevention  Hospital day # 1   Pt seen by Neuro NP/APP and later by MD. Note/plan to be edited by MD as needed.    Lynnae January, DNP, AGACNP-BC [facility] Please use AMION for contact information & EPIC for messaging.  STROKE MD NOTE :  I have personally obtained history,examined this patient, reviewed notes, independently viewed imaging studies, participated in medical decision making and plan of care.ROS completed by me personally and pertinent positives fully documented  I have made any additions or clarifications directly to the above note. Agree with note above.  She presented with 1 week history of dizziness and ataxia and MRI shows  left thalamic infarct but she has history of A-fib and admits to being noncompliant with her Eliquis.  Patient counseled to be compliant with taking Eliquis.  Discontinue Plavix unless patient has a cardiac need for it.  Continue ongoing stroke workup.  Aggressive risk factor modification.  Greater than 50% time during this 50-minute visit was spent in counseling and coordination of care and discussion about atrial fibrillation and risk-benefit of anticoagulation with Eliquis and stroke prevention and answering questions.  Stroke team will sign off.  Follow-up with an outpatient stroke clinic in 2 months  Delia Heady, MD Medical Director [facility] Pager: [phone] 01/28/2023 4:55 PM   To contact Stroke Continuity provider, please refer to [email or web address]. After hours, contact General Neurology

## 2023-01-28 NOTE — Discharge Summary (Addendum)
Family Medicine Teaching [facility] Discharge Summary  Patient name: Heather [name] [facility] Medical record number: [id number] Date of birth: October 26, 1950 Age: 72 y.o. Gender: female Date of Admission: 01/27/2023  Date of Discharge: 01/28/23 Admitting Physician: Leighton Roach McDiarmid, MD  Primary Care Provider: Penne Lash, MD Consultants: Neurology  Indication for Hospitalization: CVA  Discharge Diagnoses/Problem List:  Principal Problem for Admission: Flexion Other Problems addressed during stay:  Principal Problem:   Thalamic stroke [facility]) Active Problems:   Hypertension associated with diabetes (HCC)   Type 2 diabetes mellitus with ophthalmic complication (HCC)   Hyperlipidemia associated with type 2 diabetes mellitus (HCC)   General unsteadiness   Acute CVA (cerebrovascular accident) [facility])   Brief Hospital Course:  Heather [name] is a 72 y.o. year old with a history of HLD, T2DM, CAD, history of MI, HTN, paroxysmal afib with RVR nonadherent with Eliquis who presented with dizziness and ataxia and was admitted to the [facility] Medicine Teaching service for left thalamic stroke.  Left thalamic stroke Presented after a week of dizziness acutely worsened on day of admission.  Patient did not have any acute focal deficits on neurological exam, but did seem to have coordination difficulties with ambulation.  MRI brain w/o contrast showed acute infarct in left thalamus without hemorrhage.  Neurology consulted and began workup for stroke.  MRA head not showing occlusion or acute stenosis.  Carotid ultrasounds pulmonary results showed less than 50% bilateral stenosis.  TTE showing EF 55-60% patient, severe mitral calcification, mild left atrial dilation.  Had not been compliant with outpatient medical therapy, including Eliquis for history of A-fib.  To optimize risk factors, started on clopidogrel 75 mg daily and Lovenox.  After 48-hour period of permissive hypertension, restarted on outpatient  carvedilol 3.125 mg p.o. at discharge, held other blood pressure meds until follow-up as patient has not been compliant with them.  Started patient on atorvastatin 80 mg daily given LDL of 173.  Given A1c 6.4, restarted semaglutide weekly injections at discharge.  Neurology recommended restarting on Eliquis 5 mg twice daily at discharge. Plavix was stopped.  Other chronic conditions were medically managed with home medications and formulary alternatives as necessary.  PCP Follow-up Recommendations: Restart blood pressure medicines as appropriate, discharged only on carvedilol Ensure compliance with Eliquis Optimize diabetic control (on semaglutide) and cholesterol therapy (formerly on Repatha, now on atorvastatin 80 mg) Set up outpatient PT Follow-up Final Carotid US read   Disposition: Home with outpatient PT  Discharge Condition: Stable  Discharge Exam:  Vitals:   01/28/23 1132 01/28/23 1600  BP: (!) 154/72 139/76  Pulse: 66 73  Resp: 17 17  Temp: 97.8 F (36.6 C) 98.5 F (36.9 C)  SpO2: 99% 100%   Physical Exam: General: Adult female, resting comfortably in bed, NAD, alert and at baseline. Cardiovascular: Regular rate and rhythm. Normal S1/S2. No murmurs, rubs, or gallops appreciated. 2+ radial pulses. Pulmonary: Clear bilaterally to ascultation. No increased WOB, no accessory muscle usage on room air. No wheezes, rales, or crackles. Extremities: No peripheral edema bilaterally. Neuro: Normal strength in all extremities bilaterally.  No dysmetria.  Normal finger-to-nose.  No cranial nerve deficits.  PERRLA. Psych: Pleasant, appropriate, normal attention and concentration.  Significant Procedures: None  Significant Labs and Imaging:  Recent Labs  Lab 01/27/23 1421 01/27/23 1426 01/28/23 0537  WBC 9.9  --  6.5  HGB 13.2 15.0 10.4*  HCT 41.9 44.0 33.4*  PLT 350  --  260   Recent Labs  Lab  01/27/23 1421 01/27/23 1426 01/28/23 0537  NA 142 143 141  K 4.2 3.9 3.9   CL 110 109 112*  CO2 22  --  21*  GLUCOSE 95 95 90  BUN 25* 29* 24*  CREATININE 1.67* 1.70* 1.51*  CALCIUM 9.6  --  8.7*  MG  --   --  1.8  ALKPHOS 78  --   --   AST 20  --   --   ALT 17  --   --   ALBUMIN 3.8  --   --     - MRI brain W/O contrast: Acute infarct in left thalamus without hemorrhage. - MRA head: No emergent finding, no proximal or correctable flow-reducing stenosis. - TTE:  1. Left ventricular ejection fraction, by estimation, is 55 to 60%. The  left ventricle has normal function. The left ventricle has no regional  wall motion abnormalities. Left ventricular diastolic function could not  be evaluated.   2. Right ventricular systolic function is normal. The right ventricular  size is mildly enlarged. There is mildly elevated pulmonary artery  systolic pressure.   3. Left atrial size was mildly dilated.   4. The mitral valve is degenerative. No evidence of mitral valve  regurgitation. Severe mitral annular calcification.   5. The aortic valve is tricuspid. There is mild calcification of the  aortic valve. There is mild thickening of the aortic valve. Aortic valve  regurgitation is not visualized. Aortic valve sclerosis/calcification is  present, without any evidence of  aortic stenosis.   6. Aortic dilatation noted. There is mild dilatation of the aortic arch,  measuring 35 mm.   7. Agitated saline contrast bubble study was negative, with no evidence  of any interatrial shunt.  - US carotids preliminary: <50% stenosis bilateral R and L carotid arteries  Results/Tests Pending at Time of Discharge: Final Carotid US results.   Discharge Medications:  Allergies as of 01/28/2023   No Known Allergies      Medication List     STOP taking these medications    amLODipine 10 MG tablet Commonly known as: NORVASC   cloNIDine 0.1 mg/24hr patch Commonly known as: CATAPRES - Dosed in mg/24 hr   clopidogrel 75 MG tablet Commonly known as: PLAVIX   DULoxetine  60 MG capsule Commonly known as: Cymbalta   empagliflozin 25 MG Tabs tablet Commonly known as: JARDIANCE   pregabalin 100 MG capsule Commonly known as: Lyrica   rosuvastatin 40 MG tablet Commonly known as: CRESTOR       TAKE these medications    apixaban 5 MG Tabs tablet Commonly known as: ELIQUIS Take 1 tablet (5 mg total) by mouth 2 (two) times daily.   atorvastatin 80 MG tablet Commonly known as: LIPITOR Take 1 tablet (80 mg total) by mouth daily. Start taking on: January 29, 2023   blood glucose meter kit and supplies Kit Dispense based on patient and insurance preference. Use up to four times daily as directed.   carvedilol 3.125 MG tablet Commonly known as: COREG Take 1 tablet (3.125 mg total) by mouth 2 (two) times daily with a meal.   Needles & Syringes Misc 1 Container by Does not apply route daily.   nitroGLYCERIN 0.4 MG SL tablet Commonly known as: NITROSTAT Place 1 tablet (0.4 mg total) under the tongue every 5 (five) minutes as needed for chest pain.   OneTouch Delica Plus Lancet33G Misc USE AS DIRECTED to check sugars once daily   Semaglutide(0.25 or 0.5MG /DOS) 2  MG/3ML Sopn Inject 0.5 mg into the skin once a week.               Durable Medical Equipment  (From admission, onward)           Start     Ordered   01/28/23 1204  For home use only DME Walker rolling  Once       Question Answer Comment  Walker: With 5 Inch Wheels   Patient needs a walker to treat with the following condition Weakness      01/28/23 1204            Discharge Instructions: Please refer to Patient Instructions section of EMR for full details.  Patient was counseled important signs and symptoms that should prompt return to medical care, changes in medications, dietary instructions, activity restrictions, and follow up appointments.   Follow-Up Appointments: Future Appointments  Date Time Provider Department Center  01/30/2023  9:50 AM Lincoln Brigham, MD  [facility]  02/13/2023  2:30 PM Penne Lash, MD [facility]  02/24/2023  3:00 PM Helane Gunther, DPM [facility]    Shitarev, Dimitry, MD 01/28/2023, 5:07 PM PGY-1, [facility]  I have verified that the resident's  findings are accurately documented in the resident's note. I have made edits and changes where appropriate, and agree with plan.  Celine Mans, MD, PGY-2 [facility] 5:07 PM 01/28/2023

## 2023-01-28 NOTE — Care Management Obs Status (Signed)
MEDICARE OBSERVATION STATUS NOTIFICATION   Patient Details  Name: Heather [name] MRN: [id number] Date of Birth: February 09, 1951   Medicare Observation Status Notification Given:  Yes    Kermit Balo, RN 01/28/2023, 2:22 PM

## 2023-01-28 NOTE — Assessment & Plan Note (Signed)
Only has carvedilol 3.125 mg twice daily on hand, not aware of any other meds and unclear what actual goal therapy is.  Currently in permissive hypertension window. -Permissive HTN up to 220/120 x 24-48 hours, then restart below oral anti-hypertensives -Hold carvedilol 3.125 mg twice daily, amlodipine 10 mg daily, clonidine 0.5 mg 24-hour patch weekly

## 2023-01-28 NOTE — TOC Transition Note (Addendum)
Transition of Care [facility]) - CM/SW Discharge Note   Patient Details  Name: Heather [name] MRN: [id number] Date of Birth: September 15, 1950  Transition of Care [facility]) CM/SW Contact:  Kermit Balo, RN Phone Number: 01/28/2023, 3:17 PM   Clinical Narrative:     Pt is discharging to her daughters home with outpatient therapy through [facility]. Information sent to [facility] and is on the AVS. Walker ordered through [employer] and will be delivered to the room. Pt has been non compliant with her medications at home prior to the hospital but acknowledges the importance of taking her meds as prescribed. She says she is going to do better. Pt has transportation home.   Final next level of care: OP Rehab Barriers to Discharge: No Barriers Identified   Patient Goals and CMS Choice   Choice offered to / list presented to : Patient, Adult Children  Discharge Placement                         Discharge Plan and Services Additional resources added to the After Visit Summary for                                       Social Determinants of Health (SDOH) Interventions SDOH Screenings   Food Insecurity: Food Insecurity Present (01/27/2023)  Housing: Medium Risk (01/27/2023)  Transportation Needs: No Transportation Needs (01/27/2023)  Utilities: Not At Risk (01/27/2023)  Alcohol Screen: Low Risk  (03/14/2021)  Depression (PHQ2-9): Medium Risk (04/09/2022)  Financial Resource Strain: Low Risk  (05/29/2022)  Recent Concern: Financial Resource Strain - High Risk (03/22/2022)  Physical Activity: Inactive (03/20/2022)  Social Connections: Moderately Isolated (03/14/2021)  Stress: Stress Concern Present (03/20/2022)  Tobacco Use: Medium Risk (01/27/2023)     Readmission Risk Interventions     No data to display

## 2023-01-28 NOTE — Progress Notes (Signed)
  Echocardiogram 2D Echocardiogram has been performed.  Heather [name] 01/28/2023, 11:02 AM

## 2023-01-28 NOTE — Hospital Course (Addendum)
Heather [name] is a 72 y.o. year old with a history of HLD, T2DM, CAD, history of MI, HTN, paroxysmal afib with RVR nonadherent with Eliquis who presented with dizziness and ataxia and was admitted to the [facility] Medicine Teaching service for left thalamic stroke.  Left thalamic stroke Presented after a week of dizziness acutely worsened on day of admission.  Patient did not have any acute focal deficits on neurological exam, but did seem to have coordination difficulties with ambulation.  MRI brain w/o contrast showed acute infarct in left thalamus without hemorrhage.  Neurology consulted and began workup for stroke.  MRA head not showing occlusion or acute stenosis.  Carotid ultrasounds pulmonary results showed less than 50% bilateral stenosis.  TTE showing EF 55-60% patient, severe mitral calcification, mild left atrial dilation.  Had not been compliant with outpatient medical therapy, including Eliquis for history of A-fib.  To optimize risk factors, started on clopidogrel 75 mg daily and Lovenox.  After 48-hour period of permissive hypertension, restarted on outpatient carvedilol 3.125 mg p.o. at discharge, held other blood pressure meds until follow-up as patient has not been compliant with them.  Started patient on atorvastatin 80 mg daily given LDL of 173.  Given A1c 6.4, restarted semaglutide weekly injections at discharge.  Neurology recommended restarting on Eliquis 5 mg twice daily at discharge. Plavix was stopped.  Other chronic conditions were medically managed with home medications and formulary alternatives as necessary.  PCP Follow-up Recommendations: Restart blood pressure medicines as appropriate, discharged only on carvedilol Ensure compliance with Eliquis Optimize diabetic control (on semaglutide) and cholesterol therapy (formerly on Repatha, now on atorvastatin 80 mg) Set up outpatient PT Follow-up Final Carotid US read

## 2023-01-28 NOTE — Progress Notes (Signed)
Daily Progress Note Intern Pager: [phone]  Patient name: Heather [name] [facility] Medical record number: [id number] Date of birth: 1950/11/28 Age: 72 y.o. Gender: female  Primary Care Provider: Penne Lash, MD Consultants: Neurology Code Status: FULL  Pt Overview and Major Events to Date:  12/2 - Admitted  Assessment and Plan: Heather [name] is a 72 y.o. female with a pertinent PMH of  HLD, T2DM, CAD, history of MI, HTN, paroxysmal afib with RVR nonadherent with Eliquis who presented with dizziness and ataxia and was admitted for L thalamic stroke, currently undergoing post CVA workup.  Considering cardioembolic etiology versus carotid stenosis, still awaiting results of carotid ultrasound and TTE.  Patient does not have any residual deficits, physical therapy recommending outpatient PT.  Awaiting OT evaluation.  Progressing towards discharge, will work on better PCP follow-up outpatient to assist with medication adherence.  Will plan to restart anticoagulation and semaglutide at discharge, continue rosuvastatin.  May be good to go later tonight, depending on stroke team recommendations. 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  31.6% 10-year CVD risk via PREVENT score and has poor medication adherence.  Still no focality on physical exam, though does report some coordination difficulties with ambulation. -Admit to inpatient, med-tele, attending Dr. McDiarmid -Continuous telemetry monitoring -Nursing: Vitals per unit routine, out of bed with assistance only, neuro checks q2 -SLP, PT, OT eval and treat -NPO until passes speech eval or bedside swallow -Permissive HTN up to 220/120 x 24-48 hours -Optimize risk factors as below: A1c 6.4, LDL 173 -Follow up TTE, US carotids -Clopidogrel 75 mg, resume Eliquis 5 mg BID at discharge -Neuro stroke team consulted, appreciate recs Hypertension associated with diabetes (HCC) Only has carvedilol 3.125 mg twice  daily on hand, not aware of any other meds and unclear what actual goal therapy is.  Currently in permissive hypertension window. -Permissive HTN up to 220/120 x 24-48 hours, then restart below oral anti-hypertensives -Hold carvedilol 3.125 mg twice daily, amlodipine 10 mg daily, clonidine 0.5 mg 24-hour patch weekly Type 2 diabetes mellitus with ophthalmic complication (HCC) A1c 6.4 today, goal <7.  Has been on semaglutide until 1 week ago when she ran out, not adherent with any other medicines. -CBGs ACHS -Sensitive SSI -Hold Jardiance 5 mg daily Hyperlipidemia associated with type 2 diabetes mellitus (HCC) LDL 173 today, goal <70.  Unclear why patient discontinued statin and Repatha, will benefit from outpatient follow-up. -Restart previous dose rosuvastatin 40 mg -Encourage to restart Repatha outpatient  Chronic and Stable Problems: MDD: Not adherent with duloxetine 60 mg daily, will not restart Hx paroxysmal Afib: Hold Eliquis 48 hours per Neuro, not adherent outpatient  FEN/GI: Carb modified diet PPx: Lovenox Dispo: Pending PT recommendations . Barriers include stoke workup, PT/OT eval.  Subjective:  This morning, patient reports that she is having some balance difficulties with ambulation to restroom, however does not have any other notable symptoms.  Denies further dizziness, headache.  Objective: Temp:  [97.5 F (36.4 C)-98.4 F (36.9 C)] 98.4 F (36.9 C) (12/03 0331) Pulse Rate:  [60-72] 62 (12/03 0331) Resp:  [12-22] 16 (12/03 0331) BP: (128-197)/(62-94) 144/62 (12/03 0331) SpO2:  [96 %-100 %] 99 % (12/03 0331) Weight:  [87.7 kg] 87.7 kg (12/02 1206)  Physical Exam: General: Adult female, resting comfortably in bed, NAD, alert and at baseline. Cardiovascular: Regular rate and rhythm. Normal S1/S2. No murmurs, rubs, or gallops appreciated. 2+ radial pulses. Pulmonary: Clear bilaterally to ascultation. No increased  WOB, no accessory muscle usage on room air. No wheezes,  rales, or crackles. Extremities: No peripheral edema bilaterally. Neuro: Normal strength in all extremities bilaterally.  No dysmetria.  Normal finger-to-nose.  No cranial nerve deficits.  PERRLA.  Laboratory: Most recent CBC Lab Results  Component Value Date   WBC 6.5 01/28/2023   HGB 10.4 (L) 01/28/2023   HCT 33.4 (L) 01/28/2023   MCV 86.5 01/28/2023   PLT 260 01/28/2023   Most recent BMP    Latest Ref Rng & Units 01/28/2023    5:37 AM  BMP  Glucose 70 - 99 mg/dL 90   BUN 8 - 23 mg/dL 24   Creatinine 1.30 - 1.00 mg/dL 8.65   Sodium 784 - 696 mmol/L 141   Potassium 3.5 - 5.1 mmol/L 3.9   Chloride 98 - 111 mmol/L 112   CO2 22 - 32 mmol/L 21   Calcium 8.9 - 10.3 mg/dL 8.7     Other pertinent labs: -None  New Imaging/Diagnostic Tests: -TTE pending -Vasc US carotid pending -MRA head: No emergent finding, no proximal or correctable flow-reducing stenosis.  Kapil Petropoulos Sharion Dove, MD 01/28/2023, 7:55 AM  PGY-1, [facility] Family Medicine FPTS Intern pager: [phone], text pages welcome Secure chat group [facility] Teaching Service

## 2023-01-28 NOTE — Evaluation (Signed)
Physical Therapy Evaluation Patient Details Name: Heather [name] MRN: [id number] DOB: [birth year] Today's Date: 01/28/2023  History of Present Illness  72 y.o. female presents to ED 12/2 with dizziness and ataxia. 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  PMH of HLD, T2DM, CAD, history of MI, HTN, paroxysmal afib with RVR nonadherent with Eliquis  Clinical Impression  PTA pt independent living with friend in hotel room with level entry. 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 Pt is currently limited in safe mobility by decreased muscle recruitment and associated decrease in coordination on R side UE>LE. Pt is supervision for bed mobility, contact guard for transfers and min-mod A for ambulation without AD, contact guard for ambulation with RW. PT recommending OP Neuro PT at discharge, pt reports health insurance should be able to provide transportation. PT will continue to follow acutely.         If plan is discharge home, recommend the following: A little help with walking and/or transfers;Assistance with cooking/housework;Direct supervision/assist for medications management;Assist for transportation;Help with stairs or ramp for entrance   Can travel by private vehicle    yes    Equipment Recommendations Rolling walker (2 wheels)  Recommendations for Other Services  OT consult    Functional Status Assessment Patient has had a recent decline in their functional status and demonstrates the ability to make significant improvements in function in a reasonable and predictable amount of time.     Precautions / Restrictions Precautions Precautions: Fall Precaution Comments: fall previous to hospitalization Restrictions Weight Bearing Restrictions: No      Mobility  Bed Mobility Overal bed mobility: Needs Assistance Bed Mobility: Supine to Sit     Supine to sit: Modified independent (Device/Increase time)     General bed mobility comments: use of bed rail to  pull to EoB    Transfers Overall transfer level: Needs assistance Equipment used: None Transfers: Sit to/from Stand Sit to Stand: Contact guard assist           General transfer comment: able to come to standing without physical assist but reliance on L LE initially before being able to shift weight back to R    Ambulation/Gait Ambulation/Gait assistance: Min assist, Mod assist, Contact guard assist Gait Distance (Feet): 40 Feet (+40) Assistive device: 1 person hand held assist, Rolling walker (2 wheels) Gait Pattern/deviations: Step-through pattern, Decreased weight shift to right, Scissoring, Narrow base of support, Decreased step length - right Gait velocity: slowed Gait velocity interpretation: <1.8 ft/sec, indicate of risk for recurrent falls   General Gait Details: started with min A for steadying progressing to modA with fatigue, and increasing scissoring gait, decreased coordination of R LE, trialed RW and pt able to ambulate with CGA, still has decreased R hip abduction but is able to steady with UE support  Modified Rankin (Stroke Patients Only) Modified Rankin (Stroke Patients Only) Pre-Morbid Rankin Score: Slight disability Modified Rankin: No symptoms     Balance Overall balance assessment: Needs assistance Sitting-balance support: Feet supported, No upper extremity supported Sitting balance-Leahy Scale: Normal     Standing balance support: No upper extremity supported, During functional activity Standing balance-Leahy Scale: Fair Standing balance comment: unable to accept challenge                             Pertinent Vitals/Pain Pain Assessment Pain Assessment: No/denies pain    Home Living Family/patient expects to be discharged to::  Other (Comment) (hotel) Living Arrangements: Non-relatives/Friends                 Additional Comments: has cane and RW in storage    Prior Function Prior Level of Function : Independent/Modified  Independent             Mobility Comments: works as a Production assistant, radio at a senior living facility, drives       Extremity/Trunk Assessment   Upper Extremity Assessment Upper Extremity Assessment: Defer to OT evaluation    Lower Extremity Assessment Lower Extremity Assessment: RLE deficits/detail;LLE deficits/detail RLE Deficits / Details: ROM WFL, has difficulty with recruitment of hip flexors, knee ext/flex and dorsi/plantar flexors, once activated grossly 4/5 RLE Sensation: history of peripheral neuropathy RLE Coordination: decreased fine motor LLE Deficits / Details: Strength 4/5, AROM WFL LLE Sensation: history of peripheral neuropathy    Cervical / Trunk Assessment Cervical / Trunk Assessment: Kyphotic  Communication   Communication Communication: No apparent difficulties  Cognition Arousal: Alert Behavior During Therapy: WFL for tasks assessed/performed Overall Cognitive Status: Within Functional Limits for tasks assessed                                          General Comments General comments (skin integrity, edema, etc.): HR  78-90 with ambulation, visual deficits blurriness not helped by her glasses, OT aware     Assessment/Plan    PT Assessment Patient needs continued PT services  PT Problem List Decreased strength;Decreased activity tolerance;Decreased balance;Decreased mobility;Decreased coordination;Impaired sensation       PT Treatment Interventions DME instruction;Gait training;Functional mobility training;Therapeutic activities;Therapeutic exercise;Balance training;Neuromuscular re-education;Patient/family education    PT Goals (Current goals can be found in the Care Plan section)  Acute Rehab PT Goals PT Goal Formulation: With patient Time For Goal Achievement: 02/11/23    Frequency Min 1X/week        AM-PAC PT "6 Clicks" Mobility  Outcome Measure Help needed turning from your back to your side while in a flat bed without using  bedrails?: None Help needed moving from lying on your back to sitting on the side of a flat bed without using bedrails?: A Little Help needed moving to and from a bed to a chair (including a wheelchair)?: A Little Help needed standing up from a chair using your arms (e.g., wheelchair or bedside chair)?: A Little Help needed to walk in hospital room?: A Little Help needed climbing 3-5 steps with a railing? : A Lot 6 Click Score: 18    End of Session Equipment Utilized During Treatment: Gait belt Activity Tolerance: Patient tolerated treatment well Patient left: in chair;with call bell/phone within reach;with chair alarm set Nurse Communication: Mobility status (needs RW) PT Visit Diagnosis: Other abnormalities of gait and mobility (R26.89);Unsteadiness on feet (R26.81);Muscle weakness (generalized) (M62.81);Hemiplegia and hemiparesis;Difficulty in walking, not elsewhere classified (R26.2);History of falling (Z91.81) Hemiplegia - Right/Left: Right Hemiplegia - dominant/non-dominant: Dominant Hemiplegia - caused by: Cerebral infarction    Time: 7829-5621 PT Time Calculation (min) (ACUTE ONLY): 42 min   Charges:   PT Evaluation $PT Eval Moderate Complexity: 1 Mod PT Treatments $Gait Training: 8-22 mins $Therapeutic Activity: 8-22 mins PT General Charges $$ ACUTE PT VISIT: 1 Visit         Julissa Browning B. Beverely Risen PT, DPT Acute Rehabilitation Services Please use secure chat or  Call Office [phone]   Elon Alas [employer]  01/28/2023, 9:59 AM

## 2023-01-28 NOTE — Discharge Instructions (Addendum)
Dear Heather [name],   Thank you for letting us participate in your care! In this section, you will find a brief hospital admission summary of why you were admitted to the hospital, what happened during your admission, your diagnosis/diagnoses, and recommended follow up.   *Please restart your Eliquis on 12/4 in the evening and continue taking it You had a stroke that we were able to see on MRI.  It is very important that you take both your Eliquis follow-up with your primary care provider and your neurologist outside of the hospital. You will find a list of medications that you should continue and stop below.   POST-HOSPITAL & CARE INSTRUCTIONS We recommend following up with your PCP within 1 week from being discharged from the hospital. Please let PCP/Specialists know of any changes in medications that were made which you will be able to see in the medications section of this packet.  DOCTOR'S APPOINTMENTS & FOLLOW UP Future Appointments  Date Time Provider Department Center  01/30/2023  9:50 AM Lincoln Brigham, MD [facility]  02/13/2023  2:30 PM Penne Lash, MD [facility]  02/24/2023  3:00 PM Helane Gunther, DPM [facility]     Thank you for choosing [facility]! Take care and be well!  Family Medicine Teaching Service Inpatient Team [place]  [facility]  [address] [place], [place] [address] [phone]

## 2023-01-28 NOTE — Assessment & Plan Note (Signed)
LDL 173 today, goal <70.  Unclear why patient discontinued statin and Repatha, will benefit from outpatient follow-up. -Restart previous dose rosuvastatin 40 mg -Encourage to restart Repatha outpatient

## 2023-01-28 NOTE — Care Management CC44 (Signed)
Condition Code 44 Documentation Completed  Patient Details  Name: MAYSUN WHEELUS MRN: [id number] Date of Birth: 03-08-1950   Condition Code 44 given:  Yes Patient signature on Condition Code 44 notice:  Yes Documentation of 2 MD's agreement:  Yes Code 44 added to claim:  Yes    Kermit Balo, RN 01/28/2023, 2:22 PM

## 2023-01-28 NOTE — Plan of Care (Signed)
Pt alert and oriented x 4. Denies any pain or discomfort. IV was patent and saline locked. IV removed for discharge. Continent of bowel and bladder. Discharge instructions complete with pt and family. All questions and concerns answered. Pt stable upon discharge. Problem: Education: Goal: Ability to describe self-care measures that may prevent or decrease complications (Diabetes Survival Skills Education) will improve Outcome: Progressing Goal: Individualized Educational Video(s) Outcome: Progressing   Problem: Coping: Goal: Ability to adjust to condition or change in health will improve Outcome: Progressing   Problem: Fluid Volume: Goal: Ability to maintain a balanced intake and output will improve Outcome: Progressing   Problem: Health Behavior/Discharge Planning: Goal: Ability to identify and utilize available resources and services will improve Outcome: Progressing Goal: Ability to manage health-related needs will improve Outcome: Progressing   Problem: Metabolic: Goal: Ability to maintain appropriate glucose levels will improve Outcome: Progressing   Problem: Nutritional: Goal: Maintenance of adequate nutrition will improve Outcome: Progressing Goal: Progress toward achieving an optimal weight will improve Outcome: Progressing   Problem: Skin Integrity: Goal: Risk for impaired skin integrity will decrease Outcome: Progressing   Problem: Tissue Perfusion: Goal: Adequacy of tissue perfusion will improve Outcome: Progressing   Problem: Education: Goal: Knowledge of General Education information will improve Description: Including pain rating scale, medication(s)/side effects and non-pharmacologic comfort measures Outcome: Progressing   Problem: Health Behavior/Discharge Planning: Goal: Ability to manage health-related needs will improve Outcome: Progressing   Problem: Clinical Measurements: Goal: Ability to maintain clinical measurements within normal limits will  improve Outcome: Progressing Goal: Will remain free from infection Outcome: Progressing Goal: Diagnostic test results will improve Outcome: Progressing Goal: Respiratory complications will improve Outcome: Progressing Goal: Cardiovascular complication will be avoided Outcome: Progressing   Problem: Activity: Goal: Risk for activity intolerance will decrease Outcome: Progressing   Problem: Nutrition: Goal: Adequate nutrition will be maintained Outcome: Progressing   Problem: Coping: Goal: Level of anxiety will decrease Outcome: Progressing   Problem: Elimination: Goal: Will not experience complications related to bowel motility Outcome: Progressing Goal: Will not experience complications related to urinary retention Outcome: Progressing   Problem: Pain Management: Goal: General experience of comfort will improve Outcome: Progressing   Problem: Safety: Goal: Ability to remain free from injury will improve Outcome: Progressing   Problem: Skin Integrity: Goal: Risk for impaired skin integrity will decrease Outcome: Progressing

## 2023-01-28 NOTE — Assessment & Plan Note (Addendum)
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  31.6% 10-year CVD risk via PREVENT score and has poor medication adherence.  Still no focality on physical exam, though does report some coordination difficulties with ambulation. -Admit to inpatient, med-tele, attending Dr. McDiarmid -Continuous telemetry monitoring -Nursing: Vitals per unit routine, out of bed with assistance only, neuro checks q2 -SLP, PT, OT eval and treat -NPO until passes speech eval or bedside swallow -Permissive HTN up to 220/120 x 24-48 hours -Optimize risk factors as below: A1c 6.4, LDL 173 -Follow up TTE, US carotids -Clopidogrel 75 mg, resume Eliquis 5 mg BID at discharge -Neuro stroke team consulted, appreciate recs

## 2023-01-28 NOTE — Evaluation (Signed)
Occupational Therapy Evaluation Patient Details Name: Heather [name] MRN: [id number] DOB: 1951/02/02 Today's Date: 01/28/2023   History of Present Illness 72 y.o. female presents to ED 12/2 with dizziness and ataxia. 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  PMH of HLD, T2DM, CAD, history of MI, HTN, paroxysmal afib with RVR nonadherent with Eliquis   Clinical Impression   Pt independent at baseline with ADLs and was using cane 1 week PTA for functional mobility, was living in a hotel PTA, but plans to go to her daughter's home in [facility] at d/c. Pt currently needing set up - min A for ADLs, mod I for bed mobility and CGA for transfers with RW. Pt with decr coordination in RUE and reports blurred vision but functional for BADL/reading tasks. Pt presenting with impairments listed below, will follow acutely. Recommend OP OT at d/c.       If plan is discharge home, recommend the following: A little help with walking and/or transfers;A little help with bathing/dressing/bathroom;Assistance with cooking/housework;Direct supervision/assist for medications management;Direct supervision/assist for financial management;Assist for transportation    Functional Status Assessment  Patient has had a recent decline in their functional status and demonstrates the ability to make significant improvements in function in a reasonable and predictable amount of time.  Equipment Recommendations  Tub/shower seat    Recommendations for Other Services PT consult     Precautions / Restrictions Precautions Precautions: Fall Precaution Comments: fall previous to hospitalization Restrictions Weight Bearing Restrictions: No      Mobility Bed Mobility Overal bed mobility: Modified Independent                  Transfers Overall transfer level: Needs assistance Equipment used: None Transfers: Sit to/from Stand Sit to Stand: Contact guard assist                  Balance Overall balance  assessment: Needs assistance Sitting-balance support: Feet supported, No upper extremity supported Sitting balance-Leahy Scale: Normal     Standing balance support: No upper extremity supported, During functional activity Standing balance-Leahy Scale: Fair                             ADL either performed or assessed with clinical judgement   ADL Overall ADL's : Needs assistance/impaired Eating/Feeding: Set up   Grooming: Set up   Upper Body Bathing: Minimal assistance   Lower Body Bathing: Minimal assistance   Upper Body Dressing : Minimal assistance   Lower Body Dressing: Minimal assistance   Toilet Transfer: Contact guard assist;Rolling walker (2 wheels)   Toileting- Clothing Manipulation and Hygiene: Supervision/safety       Functional mobility during ADLs: Contact guard assist;Rolling walker (2 wheels)       Vision Baseline Vision/History: 1 Wears glasses Ability to See in Adequate Light: 1 Impaired Patient Visual Report: Blurring of vision Vision Assessment?: Yes Eye Alignment: Within Functional Limits Ocular Range of Motion: Within Functional Limits Alignment/Gaze Preference: Within Defined Limits Tracking/Visual Pursuits: Able to track stimulus in all quads without difficulty Convergence: Within functional limits Additional Comments: reports, blurred vision, can read paragraph with glasses donned without error     Perception Perception: Not tested       Praxis Praxis: Not tested       Pertinent Vitals/Pain Pain Assessment Pain Assessment: No/denies pain     Extremity/Trunk Assessment Upper Extremity Assessment Upper Extremity Assessment: Right hand dominant;RUE deficits/detail RUE Deficits / Details:  75% ROM, 3+/5 strength, decr fine motor coordination RUE Sensation: decreased light touch (reports tingling in fingertips on R hand) RUE Coordination: decreased fine motor;decreased gross motor   Lower Extremity Assessment Lower  Extremity Assessment: Defer to PT evaluation       Communication Communication Communication: No apparent difficulties   Cognition Arousal: Alert Behavior During Therapy: WFL for tasks assessed/performed Overall Cognitive Status: Within Functional Limits for tasks assessed                                 General Comments: SBT without error     General Comments  VSS    Exercises     Shoulder Instructions      Home Living Family/patient expects to be discharged to:: Other (Comment) (hotel) Living Arrangements: Non-relatives/Friends (may go to daughter's home in [place] as well)                               Additional Comments: has cane and RW in storage      Prior Functioning/Environment Prior Level of Function : Independent/Modified Independent;Driving             Mobility Comments: was using cane for the past week, works as a Production assistant, radio at a senior living facility, drives ADLs Comments: ind        OT Problem List: Decreased strength;Decreased range of motion;Decreased activity tolerance;Impaired balance (sitting and/or standing);Decreased coordination;Impaired vision/perception;Impaired UE functional use;Impaired sensation      OT Treatment/Interventions: Self-care/ADL training;Therapeutic exercise;Energy conservation;DME and/or AE instruction;Therapeutic activities;Balance training;Patient/family education;Visual/perceptual remediation/compensation;Cognitive remediation/compensation    OT Goals(Current goals can be found in the care plan section) Acute Rehab OT Goals Patient Stated Goal: none stated OT Goal Formulation: With patient Time For Goal Achievement: 02/11/23 Potential to Achieve Goals: Good ADL Goals Pt Will Perform Lower Body Dressing: with modified independence;sitting/lateral leans;sit to/from stand Pt Will Transfer to Toilet: with modified independence;ambulating;regular height toilet Pt Will Perform Tub/Shower  Transfer: Tub transfer;Shower transfer;with modified independence;ambulating Pt/caregiver will Perform Home Exercise Program: Right Upper extremity;With written HEP provided;With Supervision  OT Frequency: Min 1X/week    Co-evaluation              AM-PAC OT "6 Clicks" Daily Activity     Outcome Measure Help from another person eating meals?: A Little Help from another person taking care of personal grooming?: A Little Help from another person toileting, which includes using toliet, bedpan, or urinal?: A Little Help from another person bathing (including washing, rinsing, drying)?: A Little Help from another person to put on and taking off regular upper body clothing?: A Little Help from another person to put on and taking off regular lower body clothing?: A Little 6 Click Score: 18   End of Session Equipment Utilized During Treatment: Rolling walker (2 wheels) Nurse Communication: Mobility status  Activity Tolerance: Patient tolerated treatment well Patient left: in bed;with call bell/phone within reach;with bed alarm set  OT Visit Diagnosis: Unsteadiness on feet (R26.81);Other abnormalities of gait and mobility (R26.89);Muscle weakness (generalized) (M62.81);Other symptoms and signs involving the nervous system (R29.898)                Time: 1520-1550 OT Time Calculation (min): 30 min Charges:  OT General Charges $OT Visit: 1 Visit OT Evaluation $OT Eval Moderate Complexity: 1 Mod OT Treatments $Self Care/Home Management : 8-22 mins  Carver Fila,  OTD, OTR/L SecureChat Preferred Acute Rehab [phone]   Carver Fila Koonce 01/28/2023, 4:14 PM

## 2023-01-28 NOTE — Assessment & Plan Note (Signed)
A1c 6.4 today, goal <7.  Has been on semaglutide until 1 week ago when she ran out, not adherent with any other medicines. -CBGs ACHS -Sensitive SSI -Hold Jardiance 5 mg daily

## 2023-01-28 NOTE — Progress Notes (Signed)
SLP Cancellation Note  Patient Details Name: Heather [name] MRN: [id number] DOB: Jan 21, 1951   Cancelled treatment:       Reason Eval/Treat Not Completed: Other (comment)  Patient has passed Yale swallow with nursing. SLP messaged RN via secure Epic chat and she reported patient is eating and drinking without observed difficulties. SLP to s/o but please reorder if needed. Thank you for this consult!  Angela Nevin, MA, CCC-SLP Speech Therapy  Heather [name] 01/28/2023, 2:07 PM

## 2023-01-29 ENCOUNTER — Other Ambulatory Visit (HOSPITAL_COMMUNITY): Payer: Self-pay

## 2023-01-30 ENCOUNTER — Ambulatory Visit (INDEPENDENT_AMBULATORY_CARE_PROVIDER_SITE_OTHER): Payer: Medicare HMO | Admitting: Family Medicine

## 2023-01-30 ENCOUNTER — Encounter: Payer: Self-pay | Admitting: Family Medicine

## 2023-01-30 VITALS — BP 169/83 | HR 72 | Ht 67.0 in | Wt 186.4 lb

## 2023-01-30 DIAGNOSIS — R2681 Unsteadiness on feet: Secondary | ICD-10-CM

## 2023-01-30 DIAGNOSIS — I10 Essential (primary) hypertension: Secondary | ICD-10-CM | POA: Diagnosis not present

## 2023-01-30 DIAGNOSIS — E782 Mixed hyperlipidemia: Secondary | ICD-10-CM

## 2023-01-30 DIAGNOSIS — I639 Cerebral infarction, unspecified: Secondary | ICD-10-CM | POA: Diagnosis not present

## 2023-01-30 LAB — GLUCOSE, CAPILLARY: Glucose-Capillary: 99 mg/dL (ref 70–99)

## 2023-01-30 MED ORDER — LOSARTAN POTASSIUM 50 MG PO TABS: 50.0000 mg | ORAL_TABLET | Freq: Every day | ORAL | 3 refills | Status: DC

## 2023-01-30 MED ORDER — EMPAGLIFLOZIN 10 MG PO TABS: 10.0000 mg | ORAL_TABLET | Freq: Every day | ORAL | 2 refills | Status: DC

## 2023-01-30 NOTE — Patient Instructions (Addendum)
Good to see you today - Thank you for coming in  Things we discussed today:  1) to help prevent future strokes, it is important that we get your blood pressure under control. - Start taking losartan 50 mg once a day.  This will help decrease your blood pressure. - Start taking Jardiance 10 mg once a day. This will help with blood pressure and diabetes.  - Continue taking your carvedilol  2) Continue taking Lipitor, Eliquis, Ozempic.  3) I will send in for a shower chair  Please always bring your medication bottles

## 2023-01-30 NOTE — Progress Notes (Signed)
    SUBJECTIVE:   CHIEF COMPLAINT / HPI:   KM is a 72yo F w/ hx of T2DM, HTN, HLD, afib that p/f hospital f/u. - Admitted 12/2 for L thalamic stroke - started on eliquis, lipitor, coreg - Reports that her strength feels like it is slowly recovering - Daughter present, reports that they would like a shower chair given her gait instability - pt uses a cane and walker to walk - Has appt w/ PT on Wednesday  OBJECTIVE:   BP (!) 169/83   Pulse 72   Ht 5\' 7"  (1.702 m)   Wt 186 lb 6.4 oz (84.6 kg)   SpO2 100%   BMI 29.19 kg/m   General: Alert, pleasant woman. NAD. HEENT: NCAT. MMM. CV: RRR, no murmurs. Resp: CTAB, no wheezing or crackles. Normal WOB on RA.  Abm: Soft, nontender, nondistended. BS present. Ext: Moves all ext spontaneously Skin: Warm, well perfused  Neuro: Pushes up to stand and walks with unsteady gait  ASSESSMENT/PLAN:    Assessment & Plan Cerebrovascular accident (CVA), unspecified mechanism (HCC) Now on high intensity statin, eliquis, and coreg (for hx of afib). Decision was made to stop plavix while admitted. Pt continues to have gait instability, would benefit from shower chair to prevent falls. Will place DME order for shower chair. Educated on stroke risks and prevention. Will need to optimize HTN and diabetes control. - Start losartan 50mg  daily - Start jardiance 10mg  daily - DME shower chair - Cont PT Primary hypertension Uncontrolled, will re-uptitrate BP meds after discharge.  - Losartan as above - will likely need further agents. Was previously on amlodipine.    - f/u in 1 week for BMP  Lincoln Brigham, MD [facility]

## 2023-01-31 LAB — HM DIABETES EYE EXAM

## 2023-01-31 NOTE — Assessment & Plan Note (Signed)
Now on high intensity statin, eliquis, and coreg (for hx of afib). Decision was made to stop plavix while admitted. Pt continues to have gait instability, would benefit from shower chair to prevent falls. Will place DME order for shower chair. Educated on stroke risks and prevention. Will need to optimize HTN and diabetes control. - Start losartan 50mg  daily - Start jardiance 10mg  daily - DME shower chair - Cont PT

## 2023-02-10 ENCOUNTER — Ambulatory Visit: Payer: Self-pay | Admitting: Family Medicine

## 2023-02-13 ENCOUNTER — Ambulatory Visit (INDEPENDENT_AMBULATORY_CARE_PROVIDER_SITE_OTHER): Payer: Medicare HMO | Admitting: Family Medicine

## 2023-02-13 ENCOUNTER — Encounter: Payer: Self-pay | Admitting: Family Medicine

## 2023-02-13 VITALS — BP 167/90 | HR 71 | Ht 67.0 in | Wt 186.0 lb

## 2023-02-13 DIAGNOSIS — E1139 Type 2 diabetes mellitus with other diabetic ophthalmic complication: Secondary | ICD-10-CM | POA: Diagnosis not present

## 2023-02-13 DIAGNOSIS — I152 Hypertension secondary to endocrine disorders: Secondary | ICD-10-CM

## 2023-02-13 DIAGNOSIS — E1159 Type 2 diabetes mellitus with other circulatory complications: Secondary | ICD-10-CM

## 2023-02-13 DIAGNOSIS — I6381 Other cerebral infarction due to occlusion or stenosis of small artery: Secondary | ICD-10-CM

## 2023-02-13 MED ORDER — AMLODIPINE-OLMESARTAN 5-40 MG PO TABS: 1.0000 | ORAL_TABLET | Freq: Every day | ORAL | 0 refills | Status: DC

## 2023-02-13 NOTE — Assessment & Plan Note (Signed)
Last A1c 12/02 was 6.4. Will continue current regimen -Continue Jardiance 10 mg -Continue semaglutide 0.5 mg weekly - Urine microalbumin/Cr today - Patient has close follow up with ophthalmologist, will obtain release of information for records

## 2023-02-13 NOTE — Progress Notes (Signed)
    SUBJECTIVE:   CHIEF COMPLAINT / HPI:  Patient is here for follow-up.  She was recently admitted to the hospital and found to have a stroke, had hospital follow-up with Dr. Sherrilee Gilles.  She reports some residual right-sided weakness, she is following PT for this.  She reports that she feels much more like herself.  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 Neuro: Some residual right-sided weakness. Mild decrease in sensation to right hand Psych: Appropriate mood and affect   ASSESSMENT/PLAN:   Type 2 diabetes mellitus with ophthalmic complication (HCC) Last A1c 12/02 was 6.4. Will continue current regimen -Continue Jardiance 10 mg -Continue semaglutide 0.5 mg weekly - Urine microalbumin/Cr today - Patient has close follow up with ophthalmologist, will obtain release of information for records   Hypertension associated with diabetes (HCC) Hypertensive on exam today.  Was started on losartan 50 mg at last visit.  Will DC losartan and start patient on Azor (amlodipine-olmesartan) today. -Amlodipine-olmesartan 5-40mg  - BMP in one week for K and Cr   Thalamic stroke [facility]) Patient admitted to hospital and found to have stroke 01/27/2023 likely 2/2 to medication nonadherance. - optimize risk factor reduction (continue eliquis, jardiance, coreg)    One month follow up.   Hal Morales, MD [facility]

## 2023-02-13 NOTE — Patient Instructions (Addendum)
It was great to see you today! Thank you for choosing [facility] for your primary care. Heather [name] was seen for follow up.  Today we addressed: Your blood pressure is still high. We will start you on another blood pressure medication- this is a combo medication. Please start this today and follow up with [place] in one week for a lab visit. STOP TAKING YOUR CURRENT BLOOD PRESSURE MEDICATION.  We did a urine test for your diabetes.  Please share your medical release form at your lab visit.   If you haven't already, sign up for My Chart to have easy access to your labs results, and communication with your primary care physician.  We are checking some labs today. If they are abnormal, I will call you. If they are normal, I will send you a MyChart message (if it is active) or a letter in the mail. If you do not hear about your labs in the next 2 weeks, please call the office.   You should return to our clinic No follow-ups on file.  I recommend that you always bring your medications to each appointment as this makes it easy to ensure you are on the correct medications and helps [place] not miss refills when you need them.  Please arrive 15 minutes before your appointment to ensure smooth check in process.  We appreciate your efforts in making this happen.  Please call the clinic at [phone] if your symptoms worsen or you have any concerns.  Thank you for allowing me to participate in your care, Hal Morales, MD 02/13/2023, 2:45 PM PGY-1, [facility]

## 2023-02-13 NOTE — Assessment & Plan Note (Signed)
Patient admitted to hospital and found to have stroke 01/27/2023 likely 2/2 to medication nonadherance. - optimize risk factor reduction (continue eliquis, jardiance, coreg)

## 2023-02-13 NOTE — Assessment & Plan Note (Signed)
Hypertensive on exam today.  Was started on losartan 50 mg at last visit.  Will DC losartan and start patient on Azor (amlodipine-olmesartan) today. -Amlodipine-olmesartan 5-40mg  - BMP in one week for K and Cr

## 2023-02-14 LAB — MICROALBUMIN / CREATININE URINE RATIO
Creatinine, Urine: 98.2 mg/dL
Microalb/Creat Ratio: 618 mg/g{creat} — ABNORMAL HIGH (ref 0–29)
Microalbumin, Urine: 607.2 ug/mL

## 2023-02-16 ENCOUNTER — Telehealth: Payer: Self-pay | Admitting: Family Medicine

## 2023-02-16 NOTE — Telephone Encounter (Signed)
Called patient to discuss results of recent urine microalbumin/creatinine. Patient did not answer. Left number for clinic.

## 2023-02-17 ENCOUNTER — Telehealth: Payer: Self-pay | Admitting: Family Medicine

## 2023-02-17 ENCOUNTER — Other Ambulatory Visit: Payer: Self-pay | Admitting: Family Medicine

## 2023-02-17 DIAGNOSIS — E1139 Type 2 diabetes mellitus with other diabetic ophthalmic complication: Secondary | ICD-10-CM

## 2023-02-17 MED ORDER — AMLODIPINE-OLMESARTAN 5-40 MG PO TABS: 1.0000 | ORAL_TABLET | Freq: Every day | ORAL | 0 refills | Status: DC

## 2023-02-17 NOTE — Telephone Encounter (Signed)
Called patient to discuss results- urine Microalbumin/Cr ratio elevated, likely indicating some kidney insult from diabetes.  Patient expressed understanding.  She reports that she was unable to pick up her Azor because the pharmacy did not carry it.  Will call pharmacy today to ensure that she is able to receive this medication.  Patient is scheduled for a BMP later this week/next week.  Will follow-up on these results and manage medications accordingly.

## 2023-02-17 NOTE — Telephone Encounter (Signed)
[facility] pharmacy as patient reported not being able to pick up her Azor. Busy signal multiple times. Called [facility] at [address] and was able to ensure they have this in stock. Re-ordered medication and called patient to inform her of this change. Patient expressed understanding and will pick up script later today.

## 2023-02-20 ENCOUNTER — Other Ambulatory Visit: Payer: Medicare HMO

## 2023-02-20 DIAGNOSIS — E1139 Type 2 diabetes mellitus with other diabetic ophthalmic complication: Secondary | ICD-10-CM

## 2023-02-21 ENCOUNTER — Telehealth: Payer: Self-pay

## 2023-02-21 ENCOUNTER — Other Ambulatory Visit: Payer: Self-pay

## 2023-02-21 LAB — BASIC METABOLIC PANEL
BUN/Creatinine Ratio: 13 (ref 12–28)
BUN: 23 mg/dL (ref 8–27)
CO2: 18 mmol/L — ABNORMAL LOW (ref 20–29)
Calcium: 9.3 mg/dL (ref 8.7–10.3)
Chloride: 110 mmol/L — ABNORMAL HIGH (ref 96–106)
Creatinine, Ser: 1.82 mg/dL — ABNORMAL HIGH (ref 0.57–1.00)
Glucose: 146 mg/dL — ABNORMAL HIGH (ref 70–99)
Potassium: 4.6 mmol/L (ref 3.5–5.2)
Sodium: 146 mmol/L — ABNORMAL HIGH (ref 134–144)
eGFR: 29 mL/min/{1.73_m2} — ABNORMAL LOW (ref >59)

## 2023-02-21 NOTE — Telephone Encounter (Signed)
-----   Message from [facility] sent at 02/21/2023  1:36 PM EST ----- Can we call her to schedule a lab appointment for repeat BMP in 2-3 weeks? Her Cr is elevated likely due to starting a new hypertension medication. She can continue to take it, but we do need to keep an eye on her kidney function ----- Message ----- From: Doreene Eland, MD Sent: 02/21/2023   8:28 AM EST To: Carney Living, MD; Penne Lash, MD   ----- Message ----- From: Nell Range Lab Results In Sent: 02/21/2023   8:12 AM EST To: Doreene Eland, MD

## 2023-02-21 NOTE — Telephone Encounter (Signed)
Called patient no answer, unable to LVM. Will try again later. Penni Bombard CMA

## 2023-02-24 ENCOUNTER — Ambulatory Visit (INDEPENDENT_AMBULATORY_CARE_PROVIDER_SITE_OTHER): Payer: Medicare HMO | Admitting: Podiatry

## 2023-02-24 ENCOUNTER — Telehealth: Payer: Self-pay

## 2023-02-24 ENCOUNTER — Encounter: Payer: Self-pay | Admitting: Podiatry

## 2023-02-24 DIAGNOSIS — B351 Tinea unguium: Secondary | ICD-10-CM

## 2023-02-24 DIAGNOSIS — M79675 Pain in left toe(s): Secondary | ICD-10-CM | POA: Diagnosis not present

## 2023-02-24 DIAGNOSIS — E1165 Type 2 diabetes mellitus with hyperglycemia: Secondary | ICD-10-CM | POA: Diagnosis not present

## 2023-02-24 DIAGNOSIS — M79674 Pain in right toe(s): Secondary | ICD-10-CM | POA: Diagnosis not present

## 2023-02-24 DIAGNOSIS — G629 Polyneuropathy, unspecified: Secondary | ICD-10-CM

## 2023-02-24 NOTE — Progress Notes (Signed)
This patient returns to my office for at risk foot care.  This patient requires this care by a professional since this patient will be at risk due to having diabetic neuropathy. This patient is unable to cut nails herself since the patient cannot reach her nails.These nails are painful walking and wearing shoes.  This patient presents for at risk foot care today. Dr.  Allena Katz treated her last week.  General Appearance  Alert, conversant and in no acute stress.  Vascular  Dorsalis pedis and posterior tibial  pulses are palpable  bilaterally.  Capillary return is within normal limits  bilaterally. Temperature is within normal limits  bilaterally.  Neurologic  Senn-Weinstein monofilament wire test within normal limits  bilaterally. Muscle power within normal limits bilaterally.  Nails Thick disfigured discolored nails with subungual debris  from hallux to fifth toes bilaterally. No evidence of bacterial infection or drainage bilaterally.  Orthopedic  No limitations of motion  feet .  No crepitus or effusions noted.  No bony pathology or digital deformities noted.  Skin  normotropic skin with no porokeratosis noted bilaterally.  No signs of infections or ulcers noted.     Onychomycosis  Pain in right toes  Pain in left toes  Consent was obtained for treatment procedures.   Mechanical debridement of nails 1-5  bilaterally performed with a nail nipper.  Filed with dremel without incident. Patient requests an appointment for evaluation of her neuropathy.   Return office visit    3 months                  Told patient to return for periodic foot care and evaluation due to potential at risk complications.   Helane Gunther DPM

## 2023-02-24 NOTE — Telephone Encounter (Signed)
Called patient again, no answer. I will able to LVM informing her to call back the office regarding lab results. Also tried calling her granddaughter but that number is currently not in service. Penni Bombard CMA

## 2023-02-24 NOTE — Telephone Encounter (Signed)
-----   Message from [facility] sent at 02/21/2023  1:36 PM EST ----- Can we call her to schedule a lab appointment for repeat BMP in 2-3 weeks? Her Cr is elevated likely due to starting a new hypertension medication. She can continue to take it, but we do need to keep an eye on her kidney function ----- Message ----- From: Doreene Eland, MD Sent: 02/21/2023   8:28 AM EST To: Carney Living, MD; Penne Lash, MD   ----- Message ----- From: Nell Range Lab Results In Sent: 02/21/2023   8:12 AM EST To: Doreene Eland, MD

## 2023-02-27 ENCOUNTER — Other Ambulatory Visit: Payer: Self-pay | Admitting: Family Medicine

## 2023-03-04 ENCOUNTER — Telehealth: Payer: Self-pay | Admitting: Family Medicine

## 2023-03-04 NOTE — Telephone Encounter (Signed)
 Patient called requesting letter to be able to go back to work.   Please Advise and let patient know.   Thanks!

## 2023-03-05 ENCOUNTER — Encounter: Payer: Self-pay | Admitting: Family Medicine

## 2023-03-05 NOTE — Telephone Encounter (Signed)
 Called patient using the number [phone]) she did not answer but I was able to LVM informing her that her work letter is ready for pick up or we can mail it to her. Also tried calling her granddaughter but that number is no longer in service. Advised her to call the office back to let us  know what she would like to do. Will try again later. Cassell Mary CMA

## 2023-03-05 NOTE — Telephone Encounter (Signed)
Called patient again no answer. Penni Bombard CMA

## 2023-03-06 ENCOUNTER — Other Ambulatory Visit: Payer: Self-pay

## 2023-03-06 NOTE — Telephone Encounter (Signed)
 Patient returns call to nurse line regarding lab results.   Advised of elevated Cr level and that PCP would like her to receive repeat labs to monitor kidney function.   Scheduled patient for lab visit Monday afternoon.   Forwarding to PCP to place future orders.   Chiquita JAYSON English, RN

## 2023-03-07 ENCOUNTER — Encounter: Payer: Self-pay | Admitting: Family Medicine

## 2023-03-07 DIAGNOSIS — E1139 Type 2 diabetes mellitus with other diabetic ophthalmic complication: Secondary | ICD-10-CM

## 2023-03-10 ENCOUNTER — Ambulatory Visit: Payer: Medicare HMO | Admitting: Podiatry

## 2023-03-10 ENCOUNTER — Other Ambulatory Visit: Payer: Medicare HMO

## 2023-03-10 DIAGNOSIS — E1139 Type 2 diabetes mellitus with other diabetic ophthalmic complication: Secondary | ICD-10-CM

## 2023-03-11 ENCOUNTER — Other Ambulatory Visit: Payer: Medicare HMO

## 2023-03-11 LAB — COMPREHENSIVE METABOLIC PANEL
ALT: 16 [IU]/L (ref 0–32)
AST: 14 [IU]/L (ref 0–40)
Albumin: 3.9 g/dL (ref 3.8–4.8)
Alkaline Phosphatase: 120 [IU]/L (ref 44–121)
BUN/Creatinine Ratio: 12 (ref 12–28)
BUN: 20 mg/dL (ref 8–27)
Bilirubin Total: 0.3 mg/dL (ref 0.0–1.2)
CO2: 20 mmol/L (ref 20–29)
Calcium: 9.4 mg/dL (ref 8.7–10.3)
Chloride: 110 mmol/L — ABNORMAL HIGH (ref 96–106)
Creatinine, Ser: 1.65 mg/dL — ABNORMAL HIGH (ref 0.57–1.00)
Globulin, Total: 2.9 g/dL (ref 1.5–4.5)
Glucose: 113 mg/dL — ABNORMAL HIGH (ref 70–99)
Potassium: 4.1 mmol/L (ref 3.5–5.2)
Sodium: 146 mmol/L — ABNORMAL HIGH (ref 134–144)
Total Protein: 6.8 g/dL (ref 6.0–8.5)
eGFR: 33 mL/min/{1.73_m2} — ABNORMAL LOW (ref >59)

## 2023-03-11 LAB — HM DIABETES EYE EXAM

## 2023-03-12 ENCOUNTER — Ambulatory Visit: Payer: Medicare HMO | Admitting: Podiatry

## 2023-03-12 ENCOUNTER — Encounter: Payer: Self-pay | Admitting: Podiatry

## 2023-03-12 DIAGNOSIS — G6181 Chronic inflammatory demyelinating polyneuritis: Secondary | ICD-10-CM | POA: Diagnosis not present

## 2023-03-12 NOTE — Progress Notes (Signed)
Subjective:  Patient ID: Heather [name], female    DOB: Nov 26, 1950,  MRN: [id number]  Chief Complaint  Patient presents with   Peripheral Neuropathy    RM#14 Patient here for neuropathy last medication didn't give her no relief.     73 y.o. female presents with the above complaint.  Patient presents with complaint of neuropathy that is going on for both feet.  She has had multiple medication and plan including gabapentin and Lyrica and she has failed all treatment for neuropathy.  She has history of diabetes as well as hep C.  She wanted to discuss treatment options for neuropathy which she is interested in Qutenza therapy.   Review of Systems: Negative except as noted in the HPI. Denies N/V/F/Ch.  Past Medical History:  Diagnosis Date   Abscess, gluteal, right 05/25/2016   Depression    Diabetes mellitus    Encounter for hepatitis C screening test for low risk patient 04/13/2018   Screening 04/13/2018   Food insecurity 04/13/2018   Glaucoma    Hyperlipidemia    Hypertension    Medicare annual wellness visit, initial 04/13/2018   Patient is able to have AWV initial   Myocardial infarction [facility])    Neuropathy    Osteoporosis    Other chest pain 01/01/2016   Screening for breast cancer 04/13/2018   Sepsis (HCC) 05/25/2016   Tibial plateau fracture, left 02/16/2015   Uncontrolled diabetes mellitus with diabetic neuropathy, with long-term current use of insulin 01/01/2016    Current Outpatient Medications:    apixaban (ELIQUIS) 5 MG TABS tablet, Take 1 tablet (5 mg total) by mouth 2 (two) times daily., Disp: 60 tablet, Rfl: 0   atorvastatin (LIPITOR) 80 MG tablet, Take 1 tablet (80 mg total) by mouth daily., Disp: 30 tablet, Rfl: 0   blood glucose meter kit and supplies KIT, Dispense based on patient and insurance preference. Use up to four times daily as directed., Disp: 1 each, Rfl: 0   carvedilol (COREG) 3.125 MG tablet, Take 1 tablet (3.125 mg total) by mouth 2 (two) times  daily with a meal., Disp: 60 tablet, Rfl: 5   empagliflozin (JARDIANCE) 10 MG TABS tablet, Take 1 tablet (10 mg total) by mouth daily before breakfast., Disp: 30 tablet, Rfl: 2   Lancets (ONETOUCH DELICA PLUS LANCET33G) MISC, USE AS DIRECTED to check sugars once daily, Disp: 100 each, Rfl: 3   Needles & Syringes MISC, 1 Container by Does not apply route daily., Disp: 90 each, Rfl: 2   nitroGLYCERIN (NITROSTAT) 0.4 MG SL tablet, Place 1 tablet (0.4 mg total) under the tongue every 5 (five) minutes as needed for chest pain., Disp: 25 tablet, Rfl: 2   Semaglutide,0.25 or 0.5MG /DOS, 2 MG/3ML SOPN, Inject 0.5 mg into the skin once a week., Disp: 3 mL, Rfl: 0   amLODipine-olmesartan (AZOR) 5-40 MG tablet, TAKE 1 TABLET BY MOUTH DAILY, Disp: 30 tablet, Rfl: 0  Social History   Tobacco Use  Smoking Status Former   Types: Cigarettes  Smokeless Tobacco Never  Tobacco Comments   Smoked only at age 21   used 1-2 cigs per day for less than 1 year    No Known Allergies Objective:  There were no vitals filed for this visit. There is no height or weight on file to calculate BMI. Constitutional Well developed. Well nourished.  Vascular Dorsalis pedis pulses palpable bilaterally. Posterior tibial pulses palpable bilaterally. Capillary refill normal to all digits.  No cyanosis or clubbing noted. Pedal hair  growth normal.  Neurologic Normal speech. Oriented to person, place, and time. Decreased protective sensation noted decreased sensation and sensation to the light touch noted.  Vibratory sensation sensation absent  Dermatologic Nails well groomed and normal in appearance. No open wounds. No skin lesions.  Orthopedic: Manual muscle strength 5 out of 5 globally generalized weakness noted.  Hammertoe contractures noted.   Radiographs: None Assessment:   1. Chronic inflammatory demyelinating polyneuropathy (HCC)    Plan:  Patient was evaluated and treated and all questions  answered.  Bilateral neuropathy with failed oral medication -All questions and concerns were discussed with the patient in extensive detail -Given the nature of peripheral neuropathy and the amount of pain that is causing her I believe patient will benefit from Qutenza therapy given that she has failed all oral oral medication including gabapentin and Lyrica and has failed over-the-counter options with topical treatments.  I discussed other conservative treatment options as well.  At this time patient would like to proceed with Qutenza therapy -Will work on the approval process for Qutenza for treatment of peripheral neuropathy  No follow-ups on file.

## 2023-03-13 ENCOUNTER — Telehealth: Payer: Self-pay

## 2023-03-13 NOTE — Telephone Encounter (Signed)
Called patient, no answer, LVM informing her to schedule an appointment in 1-2 months with PCP regarding BP. Penni Bombard CMA

## 2023-03-13 NOTE — Telephone Encounter (Signed)
-----   Message from [facility] sent at 03/11/2023  4:09 PM EST ----- Regarding: Follow up Please call patient to schedule follow up for BP in 1-2 months

## 2023-03-13 NOTE — Telephone Encounter (Signed)
Attempted to call multiple times and used other numbers in the chart and was unsuccessful. If pt calls back scheduled her for bp follow in 1-2 months. Pristine Gladhill Bruna Potter, CMA

## 2023-03-16 ENCOUNTER — Other Ambulatory Visit: Payer: Self-pay | Admitting: Family Medicine

## 2023-03-16 DIAGNOSIS — E1139 Type 2 diabetes mellitus with other diabetic ophthalmic complication: Secondary | ICD-10-CM

## 2023-03-31 ENCOUNTER — Other Ambulatory Visit: Payer: Self-pay | Admitting: Family Medicine

## 2023-03-31 DIAGNOSIS — I10 Essential (primary) hypertension: Secondary | ICD-10-CM

## 2023-04-03 ENCOUNTER — Other Ambulatory Visit: Payer: Self-pay | Admitting: Podiatry

## 2023-04-03 MED ORDER — QUTENZA (4 PATCH) 8 % EX KIT: 4.0000 | PACK | Freq: Once | CUTANEOUS | 0 refills | Status: AC

## 2023-04-15 LAB — HM DIABETES EYE EXAM

## 2023-04-24 ENCOUNTER — Ambulatory Visit: Payer: Medicare HMO

## 2023-04-24 VITALS — Ht 67.0 in | Wt 184.0 lb

## 2023-04-24 DIAGNOSIS — Z0000 Encounter for general adult medical examination without abnormal findings: Secondary | ICD-10-CM
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
## 2023-04-24 NOTE — Patient Instructions (Signed)
 Heather [name] , Thank you for taking time to come for your Medicare Wellness Visit. I appreciate your ongoing commitment to your health goals. Please review the following plan we discussed and let me know if I can assist you in the future.   Referrals/Orders/Follow-Ups/Clinician Recommendations: Yes; Keep maintaining your health by keeping your appointments with Dr. Georg Ruddle and any specialists that you may see.  Call us if you need anything.  Have a great year!!!!  This is a list of the screening recommended for you and due dates:  Health Maintenance  Topic Date Due   Zoster (Shingles) Vaccine (1 of 2) Never done   Eye exam for diabetics  10/26/2019   Complete foot exam   09/13/2022   COVID-19 Vaccine (5 - 2024-25 season) 10/27/2022   Mammogram  04/13/2023   Hemoglobin A1C  07/28/2023   Yearly kidney health urinalysis for diabetes  02/13/2024   Yearly kidney function blood test for diabetes  03/09/2024   Medicare Annual Wellness Visit  04/23/2024   DTaP/Tdap/Td vaccine (2 - Td or Tdap) 02/15/2025   Colon Cancer Screening  02/25/2025   Pneumonia Vaccine  Completed   Flu Shot  Completed   DEXA scan (bone density measurement)  Completed   Hepatitis C Screening  Completed   HPV Vaccine  Aged Out    Advanced directives: (Declined) Advance directive discussed with you today. Even though you declined this today, please call our office should you change your mind, and we can give you the proper paperwork for you to fill out.  Next Medicare Annual Wellness Visit scheduled for next year: Yes

## 2023-05-01 ENCOUNTER — Ambulatory Visit: Payer: Medicare HMO | Admitting: Family Medicine

## 2023-05-01 ENCOUNTER — Encounter: Payer: Self-pay | Admitting: Family Medicine

## 2023-05-01 VITALS — BP 138/64 | HR 77 | Wt 186.0 lb

## 2023-05-01 DIAGNOSIS — I25110 Atherosclerotic heart disease of native coronary artery with unstable angina pectoris: Secondary | ICD-10-CM

## 2023-05-01 DIAGNOSIS — F32A Depression, unspecified: Secondary | ICD-10-CM

## 2023-05-01 DIAGNOSIS — E1139 Type 2 diabetes mellitus with other diabetic ophthalmic complication: Secondary | ICD-10-CM | POA: Diagnosis not present

## 2023-05-01 DIAGNOSIS — E119 Type 2 diabetes mellitus without complications: Secondary | ICD-10-CM | POA: Diagnosis not present

## 2023-05-01 DIAGNOSIS — Z1231 Encounter for screening mammogram for malignant neoplasm of breast: Secondary | ICD-10-CM

## 2023-05-01 DIAGNOSIS — Z7985 Long-term (current) use of injectable non-insulin antidiabetic drugs: Secondary | ICD-10-CM | POA: Diagnosis not present

## 2023-05-01 DIAGNOSIS — I10 Essential (primary) hypertension: Secondary | ICD-10-CM

## 2023-05-01 DIAGNOSIS — E1165 Type 2 diabetes mellitus with hyperglycemia: Secondary | ICD-10-CM

## 2023-05-01 LAB — POCT GLYCOSYLATED HEMOGLOBIN (HGB A1C): HbA1c, POC (controlled diabetic range): 7.2 % — AB (ref 0.0–7.0)

## 2023-05-01 MED ORDER — EMPAGLIFLOZIN 10 MG PO TABS: 10.0000 mg | ORAL_TABLET | Freq: Every day | ORAL | 2 refills | Status: DC

## 2023-05-01 MED ORDER — ATORVASTATIN CALCIUM 80 MG PO TABS: 80.0000 mg | ORAL_TABLET | Freq: Every day | ORAL | 3 refills | Status: DC

## 2023-05-01 MED ORDER — DULOXETINE HCL 60 MG PO CPEP: 60.0000 mg | ORAL_CAPSULE | Freq: Every day | ORAL | 0 refills | Status: DC

## 2023-05-01 MED ORDER — AMLODIPINE-OLMESARTAN 5-40 MG PO TABS: 1.0000 | ORAL_TABLET | Freq: Every day | ORAL | 3 refills | Status: DC

## 2023-05-01 MED ORDER — SEMAGLUTIDE(0.25 OR 0.5MG/DOS) 2 MG/3ML ~~LOC~~ SOPN: 0.5000 mg | PEN_INJECTOR | SUBCUTANEOUS | 5 refills | Status: DC

## 2023-05-01 MED ORDER — CARVEDILOL 3.125 MG PO TABS: 3.1250 mg | ORAL_TABLET | Freq: Two times a day (BID) | ORAL | 5 refills | Status: DC

## 2023-05-01 MED ORDER — APIXABAN 5 MG PO TABS: 5.0000 mg | ORAL_TABLET | Freq: Two times a day (BID) | ORAL | 3 refills | Status: DC

## 2023-05-01 NOTE — Assessment & Plan Note (Addendum)
 A1c elevated today to 7.2.  Likely in the setting of not having refills for her medications.  Will restart medication regimen of Ozempic 0.5 mg weekly and Jardiance 10 mg daily today. Patient filled out ROI for ophthalmology exam today.

## 2023-05-01 NOTE — Patient Instructions (Addendum)
 It was great to see you today! Thank you for choosing [facility] for your primary care. Heather [name] was seen for follow up.  Today we addressed: I reordered your medications as listed below. Please pick these up at the pharmacy. I ordered a new medication for you to take for depression. Please do not take this medication until you see your eye doctor. Lets plan to see each other in 1 month for follow up.  I ordered a mammogram- you can call for an appointment. The breast cancer for [place] imaging at Phone: [phone]  If you haven't already, sign up for My Chart to have easy access to your labs results, and communication with your primary care physician.  We are checking some labs today. If they are abnormal, I will call you. If they are normal, I will send you a MyChart message (if it is active) or a letter in the mail. If you do not hear about your labs in the next 2 weeks, please call the office.   You should return to our clinic Return in about 4 weeks (around 05/29/2023).  I recommend that you always bring your medications to each appointment as this makes it easy to ensure you are on the correct medications and helps [place] not miss refills when you need them.  Please arrive 15 minutes before your appointment to ensure smooth check in process.  We appreciate your efforts in making this happen.  Please call the clinic at [phone] if your symptoms worsen or you have any concerns.  Thank you for allowing me to participate in your care, Hal Morales, MD 05/01/2023, 3:49 PM PGY-1, [facility]

## 2023-05-01 NOTE — Progress Notes (Signed)
    SUBJECTIVE:   CHIEF COMPLAINT / HPI:   Started back at work since the end of January. Sees Dr. Allyne Gee with ophthalmology.  Is seeing Dr. Dione Booze for eye surgery. Patient has not been taking her medications except for eliquis and farxiga due to [facility] not having them in stock. Would like medication sent to another pharmacy. Reports mood has been down since January. Has started working again and this helps her mood but when she is not at work she just feels like she's tired and has no interest in doing anything.   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 Neuro: Range of motion and strength 5 out of 5 bilaterally.  Mild decrease in sensation to right third and fourth digit, unchanged from prior. Psych: Appropriate mood and affect  Flowsheet Row Office Visit from 05/01/2023 in [facility] Of [place]. [facility]  PHQ-9 Total Score 8        ASSESSMENT/PLAN:    Assessment & Plan Type 2 diabetes mellitus with other ophthalmic complication, without long-term current use of insulin (HCC) A1c elevated today to 7.2.  Likely in the setting of not having refills for her medications.  Will restart medication regimen of Ozempic 0.5 mg weekly and Jardiance 10 mg daily today. Patient filled out ROI for ophthalmology exam today.  Encounter for screening mammogram for malignant neoplasm of breast Screening mammogram last done in 2023.  Ordered mammogram at today's visit. Depression, unspecified depression type PHQ-9 at 8 today.  Patient endorses anhedonia.  Denies any thoughts of self-harm or suicidal ideations.   Denies homicidal ideation.  Given patient has history of depression, likely is experiencing worsening of symptoms.  Patient is interested in trialing medication at this time.  Will start patient on Cymbalta 60 mg daily given concurrent history of neuropathic pain.  Patient unclear regarding ophthalmic conditions, does follow with ophthalmologist regularly.  Advised patient not to start medication until she sees ophthalmologist given she does not know if she has narrow angle glaucoma.  Hal Morales, MD [facility]

## 2023-05-01 NOTE — Assessment & Plan Note (Addendum)
 PHQ-9 at 8 today.  Patient endorses anhedonia.  Denies any thoughts of self-harm or suicidal ideations.  Denies homicidal ideation.  Given patient has history of depression, likely is experiencing worsening of symptoms.  Patient is interested in trialing medication at this time.  Will start patient on Cymbalta 60 mg daily given concurrent history of neuropathic pain.  Patient unclear regarding ophthalmic conditions, does follow with ophthalmologist regularly.  Advised patient not to start medication until she sees ophthalmologist given she does not know if she has narrow angle glaucoma.

## 2023-05-06 ENCOUNTER — Other Ambulatory Visit (HOSPITAL_COMMUNITY): Payer: Self-pay

## 2023-05-08 ENCOUNTER — Other Ambulatory Visit (HOSPITAL_COMMUNITY): Payer: Self-pay

## 2023-05-14 ENCOUNTER — Ambulatory Visit: Payer: Medicare HMO | Admitting: Podiatry

## 2023-05-23 ENCOUNTER — Ambulatory Visit: Admitting: Podiatry

## 2023-05-23 DIAGNOSIS — G6181 Chronic inflammatory demyelinating polyneuritis: Secondary | ICD-10-CM

## 2023-05-23 NOTE — Progress Notes (Signed)
 Subjective:  Patient ID: Heather [name], female    DOB: May 08, 1950,  MRN: [id number]  Chief Complaint  Patient presents with   qutenza application    qutenza application    73 y.o. female presents with the above complaint.  Patient presents with follow-up of bilateral neuropathy she is here for Qutenza application she her application was approved denies any other acute complaints   Review of Systems: Negative except as noted in the HPI. Denies N/V/F/Ch.  Past Medical History:  Diagnosis Date   Abscess, gluteal, right 05/25/2016   Depression    Diabetes mellitus    Encounter for hepatitis C screening test for low risk patient 04/13/2018   Screening 04/13/2018   Food insecurity 04/13/2018   Glaucoma    Hyperlipidemia    Hypertension    Medicare annual wellness visit, initial 04/13/2018   Patient is able to have AWV initial   Myocardial infarction [facility])    Neuropathy    Osteoporosis    Other chest pain 01/01/2016   Screening for breast cancer 04/13/2018   Sepsis (HCC) 05/25/2016   Tibial plateau fracture, left 02/16/2015   Uncontrolled diabetes mellitus with diabetic neuropathy, with long-term current use of insulin 01/01/2016    Current Outpatient Medications:    amLODipine-olmesartan (AZOR) 5-40 MG tablet, Take 1 tablet by mouth daily., Disp: 30 tablet, Rfl: 3   apixaban (ELIQUIS) 5 MG TABS tablet, Take 1 tablet (5 mg total) by mouth 2 (two) times daily., Disp: 60 tablet, Rfl: 3   atorvastatin (LIPITOR) 80 MG tablet, Take 1 tablet (80 mg total) by mouth daily., Disp: 30 tablet, Rfl: 3   blood glucose meter kit and supplies KIT, Dispense based on patient and insurance preference. Use up to four times daily as directed. (Patient not taking: Reported on 04/24/2023), Disp: 1 each, Rfl: 0   carvedilol (COREG) 3.125 MG tablet, Take 1 tablet (3.125 mg total) by mouth 2 (two) times daily with a meal., Disp: 60 tablet, Rfl: 5   DULoxetine (CYMBALTA) 60 MG capsule, Take 1 capsule (60 mg  total) by mouth daily., Disp: 30 capsule, Rfl: 0   empagliflozin (JARDIANCE) 10 MG TABS tablet, Take 1 tablet (10 mg total) by mouth daily before breakfast., Disp: 30 tablet, Rfl: 2   Lancets (ONETOUCH DELICA PLUS LANCET33G) MISC, USE AS DIRECTED to check sugars once daily (Patient not taking: Reported on 04/24/2023), Disp: 100 each, Rfl: 3   Needles & Syringes MISC, 1 Container by Does not apply route daily. (Patient not taking: Reported on 04/24/2023), Disp: 90 each, Rfl: 2   nitroGLYCERIN (NITROSTAT) 0.4 MG SL tablet, Place 1 tablet (0.4 mg total) under the tongue every 5 (five) minutes as needed for chest pain. (Patient not taking: Reported on 04/24/2023), Disp: 25 tablet, Rfl: 2   Semaglutide,0.25 or 0.5MG /DOS, 2 MG/3ML SOPN, Inject 0.5 mg into the skin once a week., Disp: 3 mL, Rfl: 5  Social History   Tobacco Use  Smoking Status Former   Types: Cigarettes   Passive exposure: Past  Smokeless Tobacco Never  Tobacco Comments   Smoked only at age 14   used 1-2 cigs per day for less than 1 year    No Known Allergies Objective:  There were no vitals filed for this visit. There is no height or weight on file to calculate BMI. Constitutional Well developed. Well nourished.  Vascular Dorsalis pedis pulses palpable bilaterally. Posterior tibial pulses palpable bilaterally. Capillary refill normal to all digits.  No cyanosis or clubbing noted.  Pedal hair growth normal.  Neurologic Normal speech. Oriented to person, place, and time. Decreased protective sensation noted decreased sensation and sensation to the light touch noted.  Vibratory sensation sensation absent  Dermatologic Nails well groomed and normal in appearance. No open wounds. No skin lesions.  Orthopedic: Manual muscle strength 5 out of 5 globally generalized weakness noted.  Hammertoe contractures noted.   Radiographs: None Assessment:   1. Chronic inflammatory demyelinating polyneuropathy (HCC)     Plan:  Patient  was evaluated and treated and all questions answered.  Bilateral neuropathy with failed oral medication -All questions and concerns were discussed with the patient in extensive detail  Qutenza application -Qutenza therapy was applied to bilateral feet with 2 patches on each foot for entire circumference of both feet.  Standard application according to technique was applied.  No complication noted -I will see her back again in 3 months to repeat the process  No follow-ups on file.

## 2023-05-26 ENCOUNTER — Ambulatory Visit: Payer: Medicare HMO | Admitting: Podiatry

## 2023-05-28 ENCOUNTER — Other Ambulatory Visit: Payer: Self-pay | Admitting: Family Medicine

## 2023-05-28 DIAGNOSIS — F32A Depression, unspecified: Secondary | ICD-10-CM

## 2023-05-29 ENCOUNTER — Ambulatory Visit: Admitting: Family Medicine

## 2023-05-29 ENCOUNTER — Encounter: Payer: Self-pay | Admitting: Family Medicine

## 2023-05-29 VITALS — BP 136/96 | HR 84 | Ht 67.0 in | Wt 179.2 lb

## 2023-05-29 DIAGNOSIS — E1139 Type 2 diabetes mellitus with other diabetic ophthalmic complication: Secondary | ICD-10-CM

## 2023-05-29 DIAGNOSIS — F32A Depression, unspecified: Secondary | ICD-10-CM | POA: Diagnosis not present

## 2023-05-29 DIAGNOSIS — I25110 Atherosclerotic heart disease of native coronary artery with unstable angina pectoris: Secondary | ICD-10-CM

## 2023-05-29 DIAGNOSIS — E1159 Type 2 diabetes mellitus with other circulatory complications: Secondary | ICD-10-CM | POA: Diagnosis not present

## 2023-05-29 DIAGNOSIS — I6381 Other cerebral infarction due to occlusion or stenosis of small artery: Secondary | ICD-10-CM

## 2023-05-29 DIAGNOSIS — Z23 Encounter for immunization: Secondary | ICD-10-CM | POA: Diagnosis not present

## 2023-05-29 DIAGNOSIS — E1165 Type 2 diabetes mellitus with hyperglycemia: Secondary | ICD-10-CM

## 2023-05-29 DIAGNOSIS — I152 Hypertension secondary to endocrine disorders: Secondary | ICD-10-CM

## 2023-05-29 DIAGNOSIS — I10 Essential (primary) hypertension: Secondary | ICD-10-CM

## 2023-05-29 MED ORDER — ATORVASTATIN CALCIUM 80 MG PO TABS
80.0000 mg | ORAL_TABLET | Freq: Every day | ORAL | 3 refills | Status: DC
Start: 2023-05-29 — End: 2023-07-31

## 2023-05-29 MED ORDER — CARVEDILOL 3.125 MG PO TABS: 3.1250 mg | ORAL_TABLET | Freq: Two times a day (BID) | ORAL | 5 refills | Status: DC

## 2023-05-29 MED ORDER — SEMAGLUTIDE(0.25 OR 0.5MG/DOS) 2 MG/3ML ~~LOC~~ SOPN: 0.5000 mg | PEN_INJECTOR | SUBCUTANEOUS | 5 refills | Status: DC

## 2023-05-29 MED ORDER — APIXABAN 5 MG PO TABS: 5.0000 mg | ORAL_TABLET | Freq: Two times a day (BID) | ORAL | 3 refills | Status: DC

## 2023-05-29 MED ORDER — AMLODIPINE-OLMESARTAN 5-40 MG PO TABS: 1.0000 | ORAL_TABLET | Freq: Every day | ORAL | 3 refills | Status: DC

## 2023-05-29 MED ORDER — EMPAGLIFLOZIN 10 MG PO TABS: 10.0000 mg | ORAL_TABLET | Freq: Every day | ORAL | 2 refills | Status: DC

## 2023-05-29 NOTE — Assessment & Plan Note (Addendum)
 Is currently taking jardiance and ozempic. Has been taking these medications. Will continue current regimen. Plan to follow up in 2 months

## 2023-05-29 NOTE — Assessment & Plan Note (Signed)
 Did not take medications because cannot see. BP elevated today. Will have patient return to go over medications with me or Dr. Raymondo Band. In the meantime, will send medications to [facility] since they separate the medication out for her.

## 2023-05-29 NOTE — Progress Notes (Signed)
     SUBJECTIVE:   CHIEF COMPLAINT / HPI:   Patient here for follow up of diabetes and depression. Is not taking her medications except farxiga, eliquis, and ozempic because she cannot read the medications.   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 Psych: Appropriate mood and affect   ASSESSMENT/PLAN:   Assessment & Plan Type 2 diabetes mellitus with other ophthalmic complication, without long-term current use of insulin (HCC) Is currently taking jardiance and ozempic. Has been taking these medications. Will continue current regimen. Plan to follow up in 2 months  Depression, unspecified depression type Feeling a bit better since last time, did not try cymbalta. Will hold off for now and if she feels worse, will consider starting.  Hypertension associated with diabetes [facility]) Did not take medications because cannot see. BP elevated today. Will have patient return to go over medications with me or Dr. Raymondo Band. In the meantime, will send medications to [facility] since they separate the medication out for her.       Hal Morales, MD [facility]

## 2023-05-29 NOTE — Patient Instructions (Addendum)
 It was great to see you today! Thank you for choosing [facility] for your primary care. Heather [name] was seen for follow up.  Today we addressed: Your medications   If you haven't already, sign up for My Chart to have easy access to your labs results, and communication with your primary care physician.  We are checking some labs today. If they are abnormal, I will call you. If they are normal, I will send you a MyChart message (if it is active) or a letter in the mail. If you do not hear about your labs in the next 2 weeks, please call the office.   You should return to our clinic Return in about 8 weeks (around 07/24/2023).  I recommend that you always bring your medications to each appointment as this makes it easy to ensure you are on the correct medications and helps [place] not miss refills when you need them.  Please arrive 15 minutes before your appointment to ensure smooth check in process.  We appreciate your efforts in making this happen.  Please call the clinic at [phone] if your symptoms worsen or you have any concerns.  Thank you for allowing me to participate in your care, Hal Morales, MD 05/29/2023, 3:21 PM PGY-1, [facility]

## 2023-05-29 NOTE — Assessment & Plan Note (Signed)
 Feeling a bit better since last time, did not try cymbalta. Will hold off for now and if she feels worse, will consider starting.

## 2023-06-04 ENCOUNTER — Other Ambulatory Visit (HOSPITAL_COMMUNITY): Payer: Self-pay

## 2023-06-23 ENCOUNTER — Other Ambulatory Visit: Payer: Self-pay | Admitting: Family Medicine

## 2023-06-23 DIAGNOSIS — F32A Depression, unspecified: Secondary | ICD-10-CM

## 2023-06-27 ENCOUNTER — Encounter: Payer: Self-pay | Admitting: Family Medicine

## 2023-06-27 ENCOUNTER — Telehealth: Payer: Self-pay

## 2023-06-27 NOTE — Telephone Encounter (Signed)
 Letter created

## 2023-06-27 NOTE — Telephone Encounter (Signed)
 Placed in fax pile to be faxed to [employer].   Fax: [phone].

## 2023-06-27 NOTE — Telephone Encounter (Signed)
 Received call from [facility] in regards to a 03/29/2022 prescription of Semaglutide .  He reports Dr. Haig Levan sent in Semaglutide  2mg /1.55mL on 03/29/2022. The pharmacy reports during this time the Semaglutide  pens were transitioning from 2mg /1.65mL to 2mg /42mL pens. Same dosing.  During this transition period, the pharmacist Charlcie Conger) reports he was changing the pens in order to dispense the medication to intended patients. As the 2mg /1.60mL pens no longer existed.   He reports he is needing a letter stating the 03/29/2022 prescription of Semaglutide  2mg /1.43mL was meant to be written as Semaglutide  2mg /12mL as prescriber at the time was unaware of pen change.   Will forward to medical director as discussed with her.

## 2023-07-02 ENCOUNTER — Other Ambulatory Visit (HOSPITAL_COMMUNITY): Payer: Self-pay

## 2023-07-31 ENCOUNTER — Encounter: Payer: Self-pay | Admitting: Family Medicine

## 2023-07-31 ENCOUNTER — Ambulatory Visit: Admitting: Family Medicine

## 2023-07-31 VITALS — BP 136/85 | HR 70 | Ht 67.0 in | Wt 178.6 lb

## 2023-07-31 DIAGNOSIS — I6381 Other cerebral infarction due to occlusion or stenosis of small artery: Secondary | ICD-10-CM

## 2023-07-31 DIAGNOSIS — E1165 Type 2 diabetes mellitus with hyperglycemia: Secondary | ICD-10-CM

## 2023-07-31 DIAGNOSIS — E1159 Type 2 diabetes mellitus with other circulatory complications: Secondary | ICD-10-CM | POA: Diagnosis not present

## 2023-07-31 DIAGNOSIS — E1139 Type 2 diabetes mellitus with other diabetic ophthalmic complication: Secondary | ICD-10-CM | POA: Diagnosis not present

## 2023-07-31 DIAGNOSIS — I10 Essential (primary) hypertension: Secondary | ICD-10-CM

## 2023-07-31 DIAGNOSIS — I152 Hypertension secondary to endocrine disorders: Secondary | ICD-10-CM

## 2023-07-31 LAB — POCT GLYCOSYLATED HEMOGLOBIN (HGB A1C): HbA1c, POC (controlled diabetic range): 6.9 % (ref 0.0–7.0)

## 2023-07-31 MED ORDER — SEMAGLUTIDE(0.25 OR 0.5MG/DOS) 2 MG/3ML ~~LOC~~ SOPN
0.5000 mg | PEN_INJECTOR | SUBCUTANEOUS | 7 refills | Status: DC
Start: 2023-07-31 — End: 2023-11-25

## 2023-07-31 MED ORDER — EMPAGLIFLOZIN 10 MG PO TABS: 10.0000 mg | ORAL_TABLET | Freq: Every day | ORAL | 2 refills | Status: DC

## 2023-07-31 MED ORDER — ATORVASTATIN CALCIUM 80 MG PO TABS: 80.0000 mg | ORAL_TABLET | Freq: Every day | ORAL | 3 refills | Status: DC

## 2023-07-31 MED ORDER — AMLODIPINE-OLMESARTAN 5-40 MG PO TABS: 1.0000 | ORAL_TABLET | Freq: Every day | ORAL | 3 refills | Status: DC

## 2023-07-31 NOTE — Assessment & Plan Note (Addendum)
 A1c today 6.9%.  Patient currently taking Ozempic  0.5 mg weekly and Jardiance  10 mg..  Will continue current dose, recheck in 3 months.

## 2023-07-31 NOTE — Progress Notes (Signed)
    SUBJECTIVE:   CHIEF COMPLAINT / HPI:   Patient is here for her 9-month follow-up of diabetes as well as hypertension. Is currently doing some [employer]. Is feeling much better mood wise. Denies any headaches. Showed me medication packet, was being prescribed losartan  but has not been taking it.   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 Neuro: Normal gait, moves all four extremities appropriately. Psych: Appropriate mood and affect   ASSESSMENT/PLAN:   Assessment & Plan Type 2 diabetes mellitus with other ophthalmic complication, without long-term current use of insulin  (HCC) A1c today 6.9%.  Patient currently taking Ozempic  0.5 mg weekly and Jardiance  10 mg..  Will continue current dose, recheck in 3 months.  Hypertension associated with diabetes (HCC) Blood pressure today 136/85.  Currently taking amlodipine /olmesartan  5/40 mg, Coreg  3.125 mg twice daily. Has losartan  prescribed from previous provider, but has not been taking it. Sent message to provider to stop giving refills for medication. Patient is well controlled on current regimen, will have her check BP at home and follow up in 3 months.    Johnella Naas, MD [facility]

## 2023-07-31 NOTE — Assessment & Plan Note (Addendum)
 Blood pressure today 136/85.  Currently taking amlodipine /olmesartan  5/40 mg, Coreg  3.125 mg twice daily. Has losartan  prescribed from previous provider, but has not been taking it. Sent message to provider to stop giving refills for medication. Patient is well controlled on current regimen, will have her check BP at home and follow up in 3 months.

## 2023-07-31 NOTE — Patient Instructions (Addendum)
 It was wonderful to see you today.  Please bring ALL of your medications with you to every visit.   Today we talked about:  Your blood pressure looks good! Lets keep the current regimen. Your A1C was 6.9, I don't want to make any changes to your medications. Continue working on diet and exercise!   Thank you for choosing [facility].   Please call [phone] with any questions about today's appointment.  Please arrive at least 15 minutes prior to your scheduled appointments.   If you had blood work today, I will send you a MyChart message or a letter if results are normal. Otherwise, I will give you a call.   If you had a referral placed, they will call you to set up an appointment. Please give us  a call if you don't hear back in the next 2 weeks.   If you need additional refills before your next appointment, please call your pharmacy first.   You should follow up in our clinic in Return in about 3 months (around 10/31/2023) for follow up.  Johnella Naas, MD Family Medicine

## 2023-08-27 ENCOUNTER — Ambulatory Visit: Admitting: Podiatry

## 2023-08-27 DIAGNOSIS — G6181 Chronic inflammatory demyelinating polyneuritis: Secondary | ICD-10-CM

## 2023-08-27 NOTE — Progress Notes (Signed)
 Subjective:  Patient ID: Heather [name], female    DOB: 1950/09/23,  MRN: [id number]  Chief Complaint  Patient presents with   QUTENZA     QUTENZA     73 y.o. female presents with the above complaint.  Patient presents with follow-up of bilateral neuropathy she is here for Qutenza  application she her application was approved denies any other acute complaints   Review of Systems: Negative except as noted in the HPI. Denies N/V/F/Ch.  Past Medical History:  Diagnosis Date   Abscess, gluteal, right 05/25/2016   Depression    Diabetes mellitus    Encounter for hepatitis C screening test for low risk patient 04/13/2018   Screening 04/13/2018   Food insecurity 04/13/2018   Glaucoma    Hyperlipidemia    Hypertension    Medicare annual wellness visit, initial 04/13/2018   Patient is able to have AWV initial   Myocardial infarction [facility])    Neuropathy    Osteoporosis    Other chest pain 01/01/2016   Screening for breast cancer 04/13/2018   Sepsis (HCC) 05/25/2016   Tibial plateau fracture, left 02/16/2015   Uncontrolled diabetes mellitus with diabetic neuropathy, with long-term current use of insulin  01/01/2016    Current Outpatient Medications:    losartan  (COZAAR ) 50 MG tablet, , Disp: , Rfl:    QUTENZA , 4 PATCH, 8 %, Apply topically., Disp: , Rfl:    amLODipine -olmesartan  (AZOR ) 5-40 MG tablet, Take 1 tablet by mouth daily., Disp: 30 tablet, Rfl: 3   apixaban  (ELIQUIS ) 5 MG TABS tablet, Take 1 tablet (5 mg total) by mouth 2 (two) times daily., Disp: 60 tablet, Rfl: 3   atorvastatin  (LIPITOR ) 80 MG tablet, Take 1 tablet (80 mg total) by mouth daily., Disp: 30 tablet, Rfl: 3   carvedilol  (COREG ) 3.125 MG tablet, Take 1 tablet (3.125 mg total) by mouth 2 (two) times daily with a meal., Disp: 60 tablet, Rfl: 5   empagliflozin  (JARDIANCE ) 10 MG TABS tablet, Take 1 tablet (10 mg total) by mouth daily before breakfast., Disp: 30 tablet, Rfl: 2   Semaglutide ,0.25 or 0.5MG /DOS, 2 MG/3ML  SOPN, Inject 0.5 mg into the skin once a week., Disp: 3 mL, Rfl: 7  Social History   Tobacco Use  Smoking Status Former   Types: Cigarettes   Passive exposure: Past  Smokeless Tobacco Never  Tobacco Comments   Smoked only at age 48   used 1-2 cigs per day for less than 1 year    No Known Allergies Objective:  There were no vitals filed for this visit. There is no height or weight on file to calculate BMI. Constitutional Well developed. Well nourished.  Vascular Dorsalis pedis pulses palpable bilaterally. Posterior tibial pulses palpable bilaterally. Capillary refill normal to all digits.  No cyanosis or clubbing noted. Pedal hair growth normal.  Neurologic Normal speech. Oriented to person, place, and time. Decreased protective sensation noted decreased sensation and sensation to the light touch noted.  Vibratory sensation sensation absent  Dermatologic Nails well groomed and normal in appearance. No open wounds. No skin lesions.  Orthopedic: Manual muscle strength 5 out of 5 globally generalized weakness noted.  Hammertoe contractures noted.   Radiographs: None Assessment:   No diagnosis found.   Plan:  Patient was evaluated and treated and all questions answered.  Bilateral neuropathy with failed oral medication -All questions and concerns were discussed with the patient in extensive detail  Qutenza  application -Qutenza  therapy was applied to bilateral feet with 2 patches on each foot  for entire circumference of both feet.  Standard application according to technique was applied.  No complication noted -I will see her back again in 3 months to repeat the process  No follow-ups on file.

## 2023-09-02 ENCOUNTER — Other Ambulatory Visit: Payer: Self-pay | Admitting: Family Medicine

## 2023-09-17 ENCOUNTER — Other Ambulatory Visit: Payer: Self-pay | Admitting: Family Medicine

## 2023-09-18 ENCOUNTER — Other Ambulatory Visit: Payer: Self-pay | Admitting: Family Medicine

## 2023-09-19 NOTE — Telephone Encounter (Signed)
 Called pharmacy rx has been canceled, no more request should come. Cassell Mary CMA

## 2023-11-25 ENCOUNTER — Encounter: Payer: Self-pay | Admitting: Family Medicine

## 2023-11-25 ENCOUNTER — Other Ambulatory Visit: Payer: Self-pay

## 2023-11-25 ENCOUNTER — Ambulatory Visit (INDEPENDENT_AMBULATORY_CARE_PROVIDER_SITE_OTHER): Admitting: Family Medicine

## 2023-11-25 ENCOUNTER — Other Ambulatory Visit (HOSPITAL_COMMUNITY): Payer: Self-pay

## 2023-11-25 VITALS — BP 122/85 | HR 76 | Ht 67.0 in | Wt 170.4 lb

## 2023-11-25 DIAGNOSIS — Z23 Encounter for immunization: Secondary | ICD-10-CM | POA: Diagnosis not present

## 2023-11-25 DIAGNOSIS — E1159 Type 2 diabetes mellitus with other circulatory complications: Secondary | ICD-10-CM

## 2023-11-25 DIAGNOSIS — I480 Paroxysmal atrial fibrillation: Secondary | ICD-10-CM

## 2023-11-25 DIAGNOSIS — E1139 Type 2 diabetes mellitus with other diabetic ophthalmic complication: Secondary | ICD-10-CM | POA: Diagnosis not present

## 2023-11-25 DIAGNOSIS — I25110 Atherosclerotic heart disease of native coronary artery with unstable angina pectoris: Secondary | ICD-10-CM

## 2023-11-25 DIAGNOSIS — E1165 Type 2 diabetes mellitus with hyperglycemia: Secondary | ICD-10-CM

## 2023-11-25 DIAGNOSIS — I6381 Other cerebral infarction due to occlusion or stenosis of small artery: Secondary | ICD-10-CM

## 2023-11-25 DIAGNOSIS — I152 Hypertension secondary to endocrine disorders: Secondary | ICD-10-CM

## 2023-11-25 LAB — POCT GLYCOSYLATED HEMOGLOBIN (HGB A1C): HbA1c, POC (controlled diabetic range): 6.4 % (ref 0.0–7.0)

## 2023-11-25 MED ORDER — APIXABAN 5 MG PO TABS
5.0000 mg | ORAL_TABLET | Freq: Two times a day (BID) | ORAL | 3 refills | Status: DC
  Filled 2023-11-25 – 2023-12-09 (×3): qty 60, 30d supply, fill #0
  Filled 2023-12-16 – 2024-01-08 (×4): qty 60, 30d supply, fill #1
  Filled 2024-01-29 – 2024-02-02 (×4): qty 60, 30d supply, fill #2
  Filled 2024-03-02: qty 60, 30d supply, fill #3

## 2023-11-25 MED ORDER — CARVEDILOL 3.125 MG PO TABS
3.1250 mg | ORAL_TABLET | Freq: Two times a day (BID) | ORAL | 5 refills | Status: DC
  Filled 2023-11-25 – 2024-01-05 (×5): qty 60, 30d supply, fill #0

## 2023-11-25 MED ORDER — SEMAGLUTIDE(0.25 OR 0.5MG/DOS) 2 MG/3ML ~~LOC~~ SOPN
0.2500 mg | PEN_INJECTOR | SUBCUTANEOUS | 7 refills | Status: DC
  Filled 2023-11-25: qty 3, 28d supply, fill #0
  Filled 2023-11-25: qty 3, 56d supply, fill #0
  Filled 2023-12-09: qty 3, 28d supply, fill #0
  Filled 2024-01-08: qty 3, 56d supply, fill #1
  Filled 2024-02-06: qty 3, 56d supply, fill #2

## 2023-11-25 MED ORDER — AMLODIPINE-OLMESARTAN 5-40 MG PO TABS
1.0000 | ORAL_TABLET | Freq: Every day | ORAL | 3 refills | Status: DC
  Filled 2023-11-25 – 2023-12-09 (×4): qty 30, 30d supply, fill #0
  Filled 2023-12-16 – 2024-01-08 (×4): qty 30, 30d supply, fill #1
  Filled 2024-01-29 – 2024-02-02 (×3): qty 30, 30d supply, fill #2
  Filled 2024-03-02: qty 30, 30d supply, fill #3

## 2023-11-25 MED ORDER — AMLODIPINE-OLMESARTAN 5-40 MG PO TABS
1.0000 | ORAL_TABLET | Freq: Every day | ORAL | 3 refills | Status: DC
  Filled 2023-11-25: qty 30, 30d supply, fill #0

## 2023-11-25 MED ORDER — ATORVASTATIN CALCIUM 80 MG PO TABS
80.0000 mg | ORAL_TABLET | Freq: Every day | ORAL | 3 refills | Status: DC
  Filled 2023-11-25 – 2023-12-09 (×3): qty 30, 30d supply, fill #0
  Filled 2023-12-16 – 2024-01-08 (×4): qty 30, 30d supply, fill #1
  Filled 2024-01-29 – 2024-02-02 (×3): qty 30, 30d supply, fill #2
  Filled 2024-03-02: qty 30, 30d supply, fill #3

## 2023-11-25 NOTE — Assessment & Plan Note (Signed)
 In range, patient to continue taking Azor  5/40 mg

## 2023-11-25 NOTE — Progress Notes (Signed)
    SUBJECTIVE:   CHIEF COMPLAINT / HPI:   Patient presents for diabetes follow-up, patient doing well on current regimen.  A1c today 6.4%.  Patient has no other complaints at this time.  Patient has not seen cardiologist in some time, will call to make an appointment.  PERTINENT  PMH / PSH:  Hypertension Hyperlipidemia DM 2 Thalamic stroke A-fib NSTEMI  OBJECTIVE:   BP 122/85   Pulse 76   Ht 5' 7 (1.702 m)   Wt 170 lb 6.4 oz (77.3 kg)   SpO2 99%   BMI 26.69 kg/m   General: A&O, NAD HEENT: No sign of trauma, EOM grossly intact Cardiac: RRR, no m/r/g Respiratory: CTAB, normal WOB, no w/c/r GI: Soft, NTTP, non-distended  Extremities: NTTP, no peripheral edema. Neuro: Normal gait, moves all four extremities appropriately. Psych: Appropriate mood and affect   ASSESSMENT/PLAN:   Assessment & Plan Type 2 diabetes mellitus with other ophthalmic complication, without long-term current use of insulin  (HCC) A1C 6.4% today. Will decrease dose of Ozempic  to 0.25 mg weekly, patient to discontinue Ozempic  after 4 weeks. -Continue atorvastatin  80 mg Encounter for immunization COVID and flu VAX given today   Hypertension associated with diabetes (HCC) In range, patient to continue taking Azor  5/40 mg PAF (paroxysmal atrial fibrillation) (HCC) Patient with history of A-fib, on Eliquis  5 mg.  Patient also has history of CAD on Coreg  3.125mg . Patient to call cardiologist to follow up    Mammogram discussed again, previously ordered. Provided information to schedule.   Gloriann Ogren, MD [facility]

## 2023-11-25 NOTE — Assessment & Plan Note (Addendum)
 Patient with history of A-fib, on Eliquis  5 mg.  Patient also has history of CAD on Coreg  3.125mg . Patient to call cardiologist to follow up

## 2023-11-25 NOTE — Patient Instructions (Addendum)
 It was wonderful to see you today.  Please bring ALL of your medications with you to every visit.   Today we talked about:  We decreased your ozempic  to 0.25mg . Take this for 4 weeks and then stop.   Please call your cardiologist   Cardiologist:  [facility] [address] [place], [place] [address] [phone]  Mammogram!!   Thank you for choosing [facility].   Please call [phone] with any questions about today's appointment.  Please arrive at least 15 minutes prior to your scheduled appointments.   If you had blood work today, I will send you a MyChart message or a letter if results are normal. Otherwise, I will give you a call.   If you had a referral placed, they will call you to set up an appointment. Please give us  a call if you don't hear back in the next 2 weeks.   If you need additional refills before your next appointment, please call your pharmacy first.   Do you need your medications delivered to your home?   We'll send your prescription to the [facility] for delivery.          Address: [address] [place], [place], [place] [address]          Phone: [phone]  Please call the [facility] to speak with a pharmacist and set up your home medication delivery. If you have any questions, feel free to contact us  -- we're happy to help!  Other [facility] that offer affordable prices on both prescriptions and over-the-counter items, as well as convenient services like vaccinations, are  [facility], at [facility]         Address:  [address], [place], [place] [address]         Phone: [phone]  [facility], located in the [facility]        Address: [address], [place], [place] [address]        Phone: [phone]  [facility], at [facility]       Address: [address], [place], [place] [address]       Phone: [phone]  [facility], at [facility]       Address: [address], [place], [place] [address]       Phone: [phone]  You should follow up in our clinic in Return in about 3 months (around 02/24/2024).  Gloriann Ogren, MD Family Medicine

## 2023-11-25 NOTE — Assessment & Plan Note (Addendum)
 A1C 6.4% today. Will decrease dose of Ozempic  to 0.25 mg weekly, patient to discontinue Ozempic  after 4 weeks. -Continue atorvastatin  80 mg

## 2023-11-28 ENCOUNTER — Ambulatory Visit: Admitting: Podiatry

## 2023-12-04 ENCOUNTER — Other Ambulatory Visit (HOSPITAL_COMMUNITY): Payer: Self-pay

## 2023-12-09 ENCOUNTER — Other Ambulatory Visit (HOSPITAL_COMMUNITY): Payer: Self-pay

## 2023-12-09 ENCOUNTER — Other Ambulatory Visit: Payer: Self-pay

## 2023-12-10 ENCOUNTER — Other Ambulatory Visit: Payer: Self-pay

## 2023-12-10 ENCOUNTER — Ambulatory Visit: Admitting: Podiatry

## 2023-12-10 DIAGNOSIS — G6181 Chronic inflammatory demyelinating polyneuritis: Secondary | ICD-10-CM

## 2023-12-10 NOTE — Progress Notes (Signed)
 Subjective:  Patient ID: Heather [name], female    DOB: 11-03-1950,  MRN: [id number]  Chief Complaint  Patient presents with   [facility]     [facility] no calluses A1c 6.4 10/2023. Eliquis     73 y.o. female presents with the above complaint.  Patient presents with follow-up of bilateral neuropathy she is here for Qutenza  application she her application was approved denies any other acute complaints   Review of Systems: Negative except as noted in the HPI. Denies N/V/F/Ch.  Past Medical History:  Diagnosis Date   Abscess, gluteal, right 05/25/2016   Depression    Diabetes mellitus    Encounter for hepatitis C screening test for low risk patient 04/13/2018   Screening 04/13/2018   Food insecurity 04/13/2018   Glaucoma    Hyperlipidemia    Hypertension    Medicare annual wellness visit, initial 04/13/2018   Patient is able to have AWV initial   Myocardial infarction [facility])    Neuropathy    Osteoporosis    Other chest pain 01/01/2016   Screening for breast cancer 04/13/2018   Sepsis (HCC) 05/25/2016   Tibial plateau fracture, left 02/16/2015   Uncontrolled diabetes mellitus with diabetic neuropathy, with long-term current use of insulin  01/01/2016    Current Outpatient Medications:    amLODipine -olmesartan  (AZOR ) 5-40 MG tablet, Take 1 tablet by mouth daily., Disp: 30 tablet, Rfl: 3   apixaban  (ELIQUIS ) 5 MG TABS tablet, Take 1 tablet (5 mg total) by mouth 2 (two) times daily., Disp: 60 tablet, Rfl: 3   atorvastatin  (LIPITOR ) 80 MG tablet, Take 1 tablet (80 mg total) by mouth daily., Disp: 30 tablet, Rfl: 3   carvedilol  (COREG ) 3.125 MG tablet, Take 1 tablet (3.125 mg total) by mouth 2 (two) times daily with a meal., Disp: 60 tablet, Rfl: 5   empagliflozin  (JARDIANCE ) 10 MG TABS tablet, Take 1 tablet (10 mg total) by mouth daily before breakfast., Disp: 30 tablet, Rfl: 2   QUTENZA , 4 PATCH, 8 %, Apply topically., Disp: , Rfl:    Semaglutide ,0.25 or 0.5MG /DOS, 2 MG/3ML SOPN, Inject 0.25 mg  into the skin once a week., Disp: 3 mL, Rfl: 7  Social History   Tobacco Use  Smoking Status Former   Types: Cigarettes   Passive exposure: Past  Smokeless Tobacco Never  Tobacco Comments   Smoked only at age 1   used 1-2 cigs per day for less than 1 year    No Known Allergies Objective:  There were no vitals filed for this visit. There is no height or weight on file to calculate BMI. Constitutional Well developed. Well nourished.  Vascular Dorsalis pedis pulses palpable bilaterally. Posterior tibial pulses palpable bilaterally. Capillary refill normal to all digits.  No cyanosis or clubbing noted. Pedal hair growth normal.  Neurologic Normal speech. Oriented to person, place, and time. Decreased protective sensation noted decreased sensation improving and sensation to the light touch noted but is improving.  Vibratory sensation sensation absent  Dermatologic Nails well groomed and normal in appearance. No open wounds. No skin lesions.  Orthopedic: Manual muscle strength 5 out of 5 globally generalized weakness noted.  Hammertoe contractures noted.   Radiographs: None Assessment:   1. Chronic inflammatory demyelinating polyneuropathy (HCC)      Plan:  Patient was evaluated and treated and all questions answered.  Bilateral neuropathy with failed oral medication~which is improving. -All questions and concerns were discussed with the patient in extensive detail  Qutenza  application -Qutenza  therapy was applied to bilateral  feet with 2 patches on each foot for entire circumference of both feet.  Standard application according to technique was applied.  No complication noted -I will see her back again in 3 months to repeat the process  No follow-ups on file.

## 2023-12-16 ENCOUNTER — Other Ambulatory Visit (HOSPITAL_COMMUNITY): Payer: Self-pay

## 2023-12-16 ENCOUNTER — Other Ambulatory Visit: Payer: Self-pay

## 2023-12-16 MED ORDER — EMPAGLIFLOZIN 10 MG PO TABS
10.0000 mg | ORAL_TABLET | Freq: Every morning | ORAL | 0 refills | Status: DC
  Filled 2024-01-05 – 2024-01-08 (×3): qty 30, 30d supply, fill #0
  Filled ????-??-??: fill #0

## 2023-12-18 ENCOUNTER — Other Ambulatory Visit: Payer: Self-pay

## 2023-12-22 ENCOUNTER — Other Ambulatory Visit: Payer: Self-pay

## 2023-12-30 NOTE — Progress Notes (Signed)
 Cardiology Office Note:   Date:  12/30/2023  ID:  Heather [name], DOB [birth year], MRN [id number] PCP: Johneric Mcfadden Earnest, MD  [facility] Providers Cardiologist:  Georganna Archer II, MD Chief Complaint:  Chief Complaint  Patient presents with   Hypertension      History of Present Illness:   Heather [name] is a 73 y.o. female with a PMH of CAD w/ NSTEMI s/p PCI to Lcx (07/2020), PAF (on Eliquis ), DM 2 c/b neuropathy, HTN, HLD, and prior CVA who presents for follow up.  Patient was previously followed by Dr. Dann and has not been seen in a while.  Patient says that she feels great has no complaints.  She denies chest pain, SOB, PND, orthopnea, swelling, palpitations, syncope and presyncope.  She is taking all of her medications as prescribed except she is not taking carvedilol  at this time.  I do not think she realized that she was supposed to be taking it.  She notes that she is lost weight on Ozempic  but feels like it is helping.  No other complaints.   Past Medical History:  Diagnosis Date   Abscess, gluteal, right 05/25/2016   Depression    Diabetes mellitus    Encounter for hepatitis C screening test for low risk patient 04/13/2018   Screening 04/13/2018   Food insecurity 04/13/2018   Glaucoma    Hyperlipidemia    Hypertension    Medicare annual wellness visit, initial 04/13/2018   Patient is able to have AWV initial   Myocardial infarction [facility])    Neuropathy    Osteoporosis    Other chest pain 01/01/2016   Screening for breast cancer 04/13/2018   Sepsis (HCC) 05/25/2016   Tibial plateau fracture, left 02/16/2015   Uncontrolled diabetes mellitus with diabetic neuropathy, with long-term current use of insulin  01/01/2016     Studies Reviewed:    EKG:  EKG Interpretation Date/Time:  Wednesday December 31 2023 09:58:35 EST Ventricular Rate:  75 PR Interval:  160 QRS Duration:  68 QT Interval:  368 QTC Calculation: 410 R Axis:   5  Text  Interpretation: Normal sinus rhythm Nonspecific T wave abnormality When compared with ECG of 27-Jan-2023 12:09, PREVIOUS ECG IS PRESENT Confirmed by Archer Georganna [phone]) on 12/31/2023 10:10:35 AM     Cardiac Studies & Procedures   ______________________________________________________________________________________________ CARDIAC CATHETERIZATION  CARDIAC CATHETERIZATION 08/15/2020  Conclusion  CULPRIT LESION: Prox Cx lesion is 80% stenosed.  After scoring balloon angioplasty was performed, A drug-eluting stent was successfully placed using a SYNERGY XD 3.50X16 -> postdilated to 4.1 mm  Post intervention, there is a 0% residual stenosis.  -----------------------  Dist LAD-1 lesion is 45% stenosed. Dist LAD-2 lesion is 50% stenosed.  Ost RCA lesion is 45% stenosed. Prox RCA lesion is 55% stenosed.  LV end diastolic pressure is normal. There is no aortic valve stenosis.  SUMMARY  Severe single-vessel disease with 80% focal stenosis in the proximal-mid LCx, stable moderate disease in the ostial and proximal RCA as well as mid LAD.  Successful DES PCI of LCx with a Synergy DES 3.5 mm x 16 mm postdilated to 4.1 mm.  Normal LVEDP.   RECOMMENDATIONS  Transferred to 6 E. postprocedure unit for post PCI care.  Antiplatelet/anticoagulation per recommendation segment.  Continue Aggressive Risk Factor Modification with Guideline Directed Medical Therapy per primary cardiology team.    Alm Clay, MD  Findings Coronary Findings Diagnostic  Dominance: Right  Left Main Vessel was injected. Vessel is large.  Left  Anterior Descending Vessel is large. Tapers to a moderate then small caliber vessel near the apex. There is mild diffuse disease throughout the vessel. The vessel is tortuous. Dist LAD-1 lesion is 45% stenosed. The lesion is focal and concentric. Dist LAD-2 lesion is 50% stenosed. The lesion is focal and concentric.  First Diagonal Branch Vessel is small in  size.  Second Diagonal Branch Vessel is large in size.  Third Diagonal Branch Vessel is small in size.  Left Circumflex Vessel is large. The vessel is moderately tortuous. Prox Cx lesion is 80% stenosed. The lesion is focal and eccentric. Eccentric shelf like lesion  First Obtuse Marginal Branch Vessel is small in size.  Second Obtuse Marginal Branch Vessel is large in size.  Right Coronary Artery Vessel was injected. Very difficult to get coaxial imaging Vessel is moderate in size. There is mild diffuse disease throughout the vessel. Ost RCA lesion is 45% stenosed. The lesion is focal. Prox RCA lesion is 55% stenosed. The lesion is segmental and eccentric.  Acute Marginal Branch Vessel is small in size.  Right Ventricular Branch Vessel is small in size.  Right Posterior Descending Artery Vessel is small in size. There is mild disease in the vessel.  Right Posterior Atrioventricular Artery Vessel is small in size.  First Right Posterolateral Branch Vessel is small in size.  Intervention  Prox Cx lesion Stent Lesion length:  14 mm. CATHETER LAUNCHER 68F EBU3.0 guide catheter was inserted. Lesion crossed with guidewire using a WIRE ASAHI PROWATER 180CM. Pre-stent angioplasty was performed using a BALLOON SCOREFLEX 3.0X10. Maximum pressure:  12 atm. Inflation time:  20 sec. After initial angioplasty with 2.5 mm x 12 mm balloon had no effect-scoring balloon was used. A drug-eluting stent was successfully placed using a SYNERGY XD 3.50X16. Maximum pressure: 18 atm. Inflation time: 30 sec. Stent strut is well apposed. Postdilated to 4.1 mm. Post-stent angioplasty was performed using a BALLOON SAPPHIRE [place] 4.0X12. Maximum pressure:  18 atm. Inflation time:  20 sec. Post-Intervention Lesion Assessment The intervention was successful. Pre-interventional TIMI flow is 3. Post-intervention TIMI flow is 3. Treated lesion length:  16 mm. No complications occurred at this lesion. There is  a 0% residual stenosis post intervention.   CARDIAC CATHETERIZATION  CARDIAC CATHETERIZATION 01/03/2016  Conclusion  Ost RCA lesion, 50 %stenosed.  Prox RCA lesion, 50 %stenosed.  LVEDP 11 mm Hg.  Single-vessel CAD with 45-50% ostial narrowing in the RCA with subsequent catheter induced spasm to 80%,  and 50% stenosis beyond the proximal bend.  Following  IC nitroglycerin  administration and with the catheter not selectively engaged in the vessel, the stenosis appeared less than 50%.  Normal left coronary circulation.  RECOMMENDATION: Isosorbide  will be added to the patient's medical regimen.  Plan for initial medical therapy.  Findings Coronary Findings Diagnostic  Dominance: Right  Left Main Vessel was injected. Vessel is normal in caliber. Vessel is angiographically normal.  Left Anterior Descending Vessel was injected. Vessel is normal in caliber. Vessel is angiographically normal.  Ramus Intermedius Vessel is small.  Left Circumflex Vessel was injected. Vessel is normal in caliber. Vessel is angiographically normal.  Right Coronary Artery The RCA had an upward takeoff.  There was approximately 45-50% smooth ostial narrowing.  However, there was catheter dampening with selective engagement and resultant pressure dampening.  Following IC nitroglycerin  and with the catheter outside the ostium the stenosis did not appear greater than 50%.  There also was an additional 50% proximal narrowing after the initial bend  in the vessel.  Intervention  No interventions have been documented.   STRESS TESTS  NM MYOCAR MULTI W/SPECT W 01/02/2016  Narrative  There was no ST segment deviation noted during stress.  No T wave inversion was noted during stress.  There is a medium defect of mild severity present in the basal inferior, basal inferolateral, mid inferior and mid inferolateral location. The defect is reversible and consistent with ischemia. The patient has large  pendulous breasts on RAW data so this could also be explained by shifting breast attenuation artifact.  This is a high risk study.  The left ventricular ejection fraction is normal (55-65%).  Nuclear stress EF: 56%.   ECHOCARDIOGRAM  ECHOCARDIOGRAM COMPLETE 01/28/2023  Narrative ECHOCARDIOGRAM REPORT    Patient Name:   MADELYNNE LASKER Date of Exam: 01/28/2023 Medical Rec #:  [id number]     Height:       67.0 in Accession #:    [id number]    Weight:       193.3 lb Date of Birth:  04-Sep-1950      BSA:          1.993 m Patient Age:    72 years      BP:           144/62 mmHg Patient Gender: F             HR:           60 bpm. Exam Location:  Inpatient  Procedure: 2D Echo, 3D Echo, Cardiac Doppler, Color Doppler, Strain Analysis and Saline Contrast Bubble Study  Indications:    Stroke  History:        Patient has prior history of Echocardiogram examinations, most recent 05/18/2020. Risk Factors:Hypertension, Diabetes, Dyslipidemia and Former Smoker.  Sonographer:    Ozell Free Referring Phys: [id number] TODD D MCDIARMID   Sonographer Comments: Global longitudinal strain was attempted. IMPRESSIONS   1. Left ventricular ejection fraction, by estimation, is 55 to 60%. The left ventricle has normal function. The left ventricle has no regional wall motion abnormalities. Left ventricular diastolic function could not be evaluated. 2. Right ventricular systolic function is normal. The right ventricular size is mildly enlarged. There is mildly elevated pulmonary artery systolic pressure. 3. Left atrial size was mildly dilated. 4. The mitral valve is degenerative. No evidence of mitral valve regurgitation. Severe mitral annular calcification. 5. The aortic valve is tricuspid. There is mild calcification of the aortic valve. There is mild thickening of the aortic valve. Aortic valve regurgitation is not visualized. Aortic valve sclerosis/calcification is present, without any evidence of aortic  stenosis. 6. Aortic dilatation noted. There is mild dilatation of the aortic arch, measuring 35 mm. 7. Agitated saline contrast bubble study was negative, with no evidence of any interatrial shunt.  Comparison(s): No significant change from prior study.  FINDINGS Left Ventricle: Left ventricular ejection fraction, by estimation, is 55 to 60%. The left ventricle has normal function. The left ventricle has no regional wall motion abnormalities. Global longitudinal strain performed but not reported based on interpreter judgement due to suboptimal tracking. 3D ejection fraction reviewed and evaluated as part of the interpretation. Alternate measurement of EF is felt to be most reflective of LV function. The left ventricular internal cavity size was normal in size. There is no left ventricular hypertrophy. Left ventricular diastolic function could not be evaluated due to mitral annular calcification (moderate or greater). Left ventricular diastolic function could not be evaluated.  Right Ventricle: The right ventricular  size is mildly enlarged. No increase in right ventricular wall thickness. Right ventricular systolic function is normal. There is mildly elevated pulmonary artery systolic pressure. The tricuspid regurgitant velocity is 3.18 m/s, and with an assumed right atrial pressure of 3 mmHg, the estimated right ventricular systolic pressure is 43.4 mmHg.  Left Atrium: Left atrial size was mildly dilated.  Right Atrium: Right atrial size was normal in size.  Pericardium: Trivial pericardial effusion is present. The pericardial effusion is lateral to the left ventricle.  Mitral Valve: The mitral valve is degenerative in appearance. Severe mitral annular calcification. No evidence of mitral valve regurgitation. The mean mitral valve gradient is 2.5 mmHg with average heart rate of 71 bpm.  Tricuspid Valve: The tricuspid valve is normal in structure. Tricuspid valve regurgitation is  trivial.  Aortic Valve: The aortic valve is tricuspid. There is mild calcification of the aortic valve. There is mild thickening of the aortic valve. There is mild to moderate aortic valve annular calcification. Aortic valve regurgitation is not visualized. Aortic valve sclerosis/calcification is present, without any evidence of aortic stenosis. Aortic valve mean gradient measures 10.0 mmHg. Aortic valve peak gradient measures 18.8 mmHg. Aortic valve area, by VTI measures 1.41 cm.  Pulmonic Valve: The pulmonic valve was normal in structure. Pulmonic valve regurgitation is not visualized. No evidence of pulmonic stenosis.  Aorta: The aortic root and ascending aorta are structurally normal, with no evidence of dilitation and aortic dilatation noted. There is mild dilatation of the aortic arch, measuring 35 mm.  IAS/Shunts: The atrial septum is grossly normal. Agitated saline contrast was given intravenously to evaluate for intracardiac shunting. Agitated saline contrast bubble study was negative, with no evidence of any interatrial shunt.   LEFT VENTRICLE PLAX 2D LVIDd:         4.10 cm   Diastology LVIDs:         2.80 cm   LV e' medial:    4.13 cm/s LV PW:         0.90 cm   LV E/e' medial:  20.4 LV IVS:        0.90 cm   LV e' lateral:   5.77 cm/s LVOT diam:     1.90 cm   LV E/e' lateral: 14.6 LV SV:         67 LV SV Index:   34 LVOT Area:     2.84 cm  3D Volume EF: 3D EF:        53 % LV EDV:       165 ml LV ESV:       77 ml LV SV:        88 ml  RIGHT VENTRICLE RV Basal diam:  4.00 cm RV S prime:     11.30 cm/s TAPSE (M-mode): 3.3 cm  LEFT ATRIUM             Index        RIGHT ATRIUM           Index LA diam:        4.60 cm 2.31 cm/m   RA Area:     16.80 cm LA Vol (A2C):   75.6 ml 37.93 ml/m  RA Volume:   46.20 ml  23.18 ml/m LA Vol (A4C):   67.4 ml 33.81 ml/m LA Biplane Vol: 72.8 ml 36.52 ml/m AORTIC VALVE AV Area (Vmax):    1.32 cm AV Area (Vmean):   1.38 cm AV Area  (VTI):  1.41 cm AV Vmax:           217.00 cm/s AV Vmean:          141.000 cm/s AV VTI:            0.473 m AV Peak Grad:      18.8 mmHg AV Mean Grad:      10.0 mmHg LVOT Vmax:         101.00 cm/s LVOT Vmean:        68.400 cm/s LVOT VTI:          0.236 m LVOT/AV VTI ratio: 0.50  AORTA Ao Root diam: 2.80 cm Ao Asc diam:  2.70 cm  MITRAL VALVE                TRICUSPID VALVE MV Area (PHT): 2.55 cm     TR Peak grad:   40.4 mmHg MV Mean grad:  2.5 mmHg     TR Vmax:        318.00 cm/s MV Decel Time: 298 msec MV E velocity: 84.40 cm/s   SHUNTS MV A velocity: 148.00 cm/s  Systemic VTI:  0.24 m MV E/A ratio:  0.57         Systemic Diam: 1.90 cm  Stanly Leavens MD Electronically signed by Stanly Leavens MD Signature Date/Time: 01/28/2023/12:11:40 PM    Final          ______________________________________________________________________________________________      Risk Assessment/Calculations:    CHA2DS2-VASc Score = 7   This indicates a 11.2% annual risk of stroke. The patient's score is based upon:              Physical Exam:     VS:  BP 130/70 (BP Location: Left Arm, Patient Position: Sitting, Cuff Size: Normal)   Pulse 84   Ht 5' 7 (1.702 m)   Wt 171 lb 6.4 oz (77.7 kg)   SpO2 98%   BMI 26.85 kg/m      Wt Readings from Last 3 Encounters:  11/25/23 170 lb 6.4 oz (77.3 kg)  07/31/23 178 lb 9.6 oz (81 kg)  05/29/23 179 lb 3.2 oz (81.3 kg)     GEN: Well nourished, well developed, in no acute distress NECK: No JVD; No carotid bruits CARDIAC: RRR, II/VI systolic murmur at RUSB, no rubs or gallops RESPIRATORY:  Clear to auscultation without rales, wheezing or rhonchi  ABDOMEN: Soft, non-tender, non-distended, normal bowel sounds EXTREMITIES:  Warm and well perfused, no edema; No deformity, 2+ radial pulses PSYCH: Normal mood and affect   Assessment & Plan Hypertension, unspecified type - Her blood pressure is well-controlled today at  130/70. -She is supposed be taking carvedilol , but she did not realize that she was supposed to.  I will get her back onto that. Start carvedilol  6.25 mg twice daily Continue amlodipine  5 mg daily Continue olmesartan  40 mg daily Check BMP Follow-up in 6 months PAF (paroxysmal atrial fibrillation) (HCC) - Currently in NSR and tolerating Eliquis  well. - Will get back on Coreg  for rate control. Continue Eliquis  5 mg twice daily indefinitely Start Coreg  as described above Coronary artery disease involving native coronary artery of native heart with other form of angina pectoris - Patient suffered an NSTEMI and is status post PCI in 2022. -She needs aggressive secondary prevention of CAD. -Will check a lipid panel today. Continue Eliquis  monotherapy Continue atorvastatin  80 mg daily Check lipid panel Heart murmur - Patient noted to have a systolic murmur in the aortic position  on exam.  I will investigate with an echocardiogram. Complete echo Chronic kidney disease, unspecified CKD stage - The patient has CKD with evidence of proteinuria.  She continues to make urine and has no symptoms of renal insufficiency. - I am very concerned though that if her blood pressure and diabetes are not perfectly controlled that she could eventually develop more significant renal dysfunction. - Her most recent blood pressure and A1c are at goal. - I will look for the patient to get back on Jardiance  for the cardio and renal benefits; however, I need to make sure that her creatinine clearance is sufficient before reinitiating. - I will also refer her to see a nephrologist for ongoing surveillance. Refer to nephrology for monitoring of CKD Check BMP for renal function today Consider starting back Jardiance  if renal function permits           This note was written with the assistance of a dictation microphone or AI dictation software. Please excuse any typos or grammatical errors.   Signed, Georganna Archer, MD 12/30/2023 10:14 PM    [facility]

## 2023-12-31 ENCOUNTER — Ambulatory Visit
Attending: Student in an Organized Health Care Education/Training Program | Admitting: Student in an Organized Health Care Education/Training Program

## 2023-12-31 ENCOUNTER — Other Ambulatory Visit (HOSPITAL_COMMUNITY): Payer: Self-pay

## 2023-12-31 VITALS — BP 130/70 | HR 84 | Ht 67.0 in | Wt 171.4 lb

## 2023-12-31 DIAGNOSIS — R011 Cardiac murmur, unspecified: Secondary | ICD-10-CM | POA: Diagnosis not present

## 2023-12-31 DIAGNOSIS — I25118 Atherosclerotic heart disease of native coronary artery with other forms of angina pectoris: Secondary | ICD-10-CM | POA: Diagnosis not present

## 2023-12-31 DIAGNOSIS — N189 Chronic kidney disease, unspecified: Secondary | ICD-10-CM

## 2023-12-31 DIAGNOSIS — I10 Essential (primary) hypertension: Secondary | ICD-10-CM | POA: Diagnosis not present

## 2023-12-31 DIAGNOSIS — I480 Paroxysmal atrial fibrillation: Secondary | ICD-10-CM | POA: Diagnosis not present

## 2023-12-31 LAB — BASIC METABOLIC PANEL WITH GFR
BUN/Creatinine Ratio: 13 (ref 12–28)
BUN: 21 mg/dL (ref 8–27)
CO2: 20 mmol/L (ref 20–29)
Calcium: 10.3 mg/dL (ref 8.7–10.3)
Chloride: 107 mmol/L — ABNORMAL HIGH (ref 96–106)
Creatinine, Ser: 1.59 mg/dL — ABNORMAL HIGH (ref 0.57–1.00)
Glucose: 106 mg/dL — ABNORMAL HIGH (ref 70–99)
Potassium: 4.4 mmol/L (ref 3.5–5.2)
Sodium: 142 mmol/L (ref 134–144)
eGFR: 34 mL/min/1.73 — ABNORMAL LOW

## 2023-12-31 LAB — LIPID PANEL
Chol/HDL Ratio: 3.2 ratio (ref 0.0–4.4)
Cholesterol, Total: 185 mg/dL (ref 100–199)
HDL: 58 mg/dL (ref >39)
LDL Chol Calc (NIH): 104 mg/dL — ABNORMAL HIGH (ref 0–99)
Triglycerides: 129 mg/dL (ref 0–149)
VLDL Cholesterol Cal: 23 mg/dL (ref 5–40)

## 2023-12-31 MED ORDER — CARVEDILOL 6.25 MG PO TABS
6.2500 mg | ORAL_TABLET | Freq: Two times a day (BID) | ORAL | 3 refills | Status: AC
  Filled 2023-12-31: qty 180, 90d supply, fill #0
  Filled 2024-01-07 – 2024-01-08 (×2): qty 60, 30d supply, fill #0
  Filled 2024-01-29 – 2024-02-02 (×3): qty 60, 30d supply, fill #1
  Filled 2024-03-02: qty 60, 30d supply, fill #2
  Filled 2024-03-31 – 2024-04-02 (×3): qty 60, 30d supply, fill #3

## 2023-12-31 NOTE — Assessment & Plan Note (Signed)
-   Currently in NSR and tolerating Eliquis  well. - Will get back on Coreg  for rate control. Continue Eliquis  5 mg twice daily indefinitely Start Coreg  as described above

## 2023-12-31 NOTE — Patient Instructions (Signed)
 Medication Instructions:  Increase Carvedilol  to 6.25 mg twice daily *If you need a refill on your cardiac medications before your next appointment, please call your pharmacy*  Lab Work: Lipid panel, BMET today at [facility] If you have labs (blood work) drawn today and your tests are completely normal, you will receive your results only by: MyChart Message (if you have MyChart) OR A paper copy in the mail If you have any lab test that is abnormal or we need to change your treatment, we will call you to review the results.  Testing/Procedures: Echocardiogram  Follow-Up: At [facility], you and your health needs are our priority.  As part of our continuing mission to provide you with exceptional heart care, our providers are all part of one team.  This team includes your primary Cardiologist (physician) and Advanced Practice Providers or APPs (Physician Assistants and Nurse Practitioners) who all work together to provide you with the care you need, when you need it.  Your next appointment:   6 month(s)  Provider:   Floretta, MD  We recommend signing up for the patient portal called MyChart.  Sign up information is provided on this After Visit Summary.  MyChart is used to connect with patients for Virtual Visits (Telemedicine).  Patients are able to view lab/test results, encounter notes, upcoming appointments, etc.  Non-urgent messages can be sent to your provider as well.   To learn more about what you can do with MyChart, go to [email or web address].   Other Instructions You have been referred to Nephrology. Someone will reach out to you to make an appointment.

## 2024-01-05 ENCOUNTER — Other Ambulatory Visit: Payer: Self-pay

## 2024-01-05 ENCOUNTER — Other Ambulatory Visit (HOSPITAL_COMMUNITY): Payer: Self-pay

## 2024-01-06 ENCOUNTER — Other Ambulatory Visit: Payer: Self-pay

## 2024-01-06 DIAGNOSIS — Z1231 Encounter for screening mammogram for malignant neoplasm of breast: Secondary | ICD-10-CM

## 2024-01-07 ENCOUNTER — Other Ambulatory Visit: Payer: Self-pay

## 2024-01-07 ENCOUNTER — Other Ambulatory Visit (HOSPITAL_COMMUNITY): Payer: Self-pay

## 2024-01-07 ENCOUNTER — Ambulatory Visit: Payer: Self-pay | Admitting: Student in an Organized Health Care Education/Training Program

## 2024-01-07 MED ORDER — EZETIMIBE 10 MG PO TABS
10.0000 mg | ORAL_TABLET | Freq: Every day | ORAL | 3 refills | Status: AC
  Filled 2024-01-07: qty 90, 90d supply, fill #0
  Filled 2024-01-08: qty 30, 30d supply, fill #0
  Filled 2024-01-29 – 2024-02-02 (×3): qty 30, 30d supply, fill #1
  Filled 2024-03-02: qty 30, 30d supply, fill #2
  Filled 2024-03-31 – 2024-04-02 (×3): qty 30, 30d supply, fill #3

## 2024-01-08 ENCOUNTER — Other Ambulatory Visit (HOSPITAL_COMMUNITY): Payer: Self-pay

## 2024-01-08 ENCOUNTER — Other Ambulatory Visit: Payer: Self-pay

## 2024-01-09 ENCOUNTER — Other Ambulatory Visit: Payer: Self-pay

## 2024-01-09 ENCOUNTER — Other Ambulatory Visit (HOSPITAL_COMMUNITY): Payer: Self-pay

## 2024-01-10 ENCOUNTER — Other Ambulatory Visit: Payer: Self-pay

## 2024-01-20 ENCOUNTER — Other Ambulatory Visit (HOSPITAL_COMMUNITY): Payer: Self-pay

## 2024-01-20 ENCOUNTER — Other Ambulatory Visit: Payer: Self-pay

## 2024-01-20 ENCOUNTER — Other Ambulatory Visit: Payer: Self-pay | Admitting: Family Medicine

## 2024-01-20 MED ORDER — EMPAGLIFLOZIN 10 MG PO TABS
10.0000 mg | ORAL_TABLET | Freq: Every morning | ORAL | 0 refills | Status: DC
  Filled 2024-01-29 – 2024-02-02 (×3): qty 30, 30d supply, fill #0

## 2024-01-29 ENCOUNTER — Other Ambulatory Visit: Payer: Self-pay

## 2024-01-29 ENCOUNTER — Other Ambulatory Visit (HOSPITAL_COMMUNITY): Payer: Self-pay

## 2024-01-30 ENCOUNTER — Other Ambulatory Visit (HOSPITAL_COMMUNITY): Payer: Self-pay

## 2024-02-02 ENCOUNTER — Other Ambulatory Visit (HOSPITAL_COMMUNITY): Payer: Self-pay

## 2024-02-02 ENCOUNTER — Other Ambulatory Visit: Payer: Self-pay

## 2024-02-05 ENCOUNTER — Other Ambulatory Visit (HOSPITAL_COMMUNITY): Payer: Self-pay

## 2024-02-06 ENCOUNTER — Other Ambulatory Visit: Payer: Self-pay | Admitting: Family Medicine

## 2024-02-06 ENCOUNTER — Other Ambulatory Visit (HOSPITAL_COMMUNITY): Payer: Self-pay

## 2024-02-06 ENCOUNTER — Ambulatory Visit (HOSPITAL_COMMUNITY)
Admission: RE | Admit: 2024-02-06 | Discharge: 2024-02-06 | Disposition: A | Discharge status: Home / Self Care | Source: Ambulatory Visit | Attending: Student in an Organized Health Care Education/Training Program | Admitting: Student in an Organized Health Care Education/Training Program

## 2024-02-06 DIAGNOSIS — R011 Cardiac murmur, unspecified: Secondary | ICD-10-CM | POA: Diagnosis not present

## 2024-02-06 DIAGNOSIS — E1165 Type 2 diabetes mellitus with hyperglycemia: Secondary | ICD-10-CM

## 2024-02-06 LAB — ECHOCARDIOGRAM COMPLETE
AR max vel: 1.6 cm2
AV Area VTI: 1.84 cm2
AV Area mean vel: 1.6 cm2
AV Mean grad: 6 mmHg
AV Peak grad: 11.2 mmHg
Ao pk vel: 1.67 m/s
Area-P 1/2: 2.56 cm2
Calc EF: 51.2 %
MV VTI: 1.74 cm2
S' Lateral: 2.7 cm
Single Plane A2C EF: 41.2 %
Single Plane A4C EF: 59.3 %

## 2024-02-10 NOTE — Progress Notes (Signed)
 Letter mailed

## 2024-02-17 ENCOUNTER — Ambulatory Visit

## 2024-03-01 ENCOUNTER — Encounter: Payer: Self-pay | Admitting: Family Medicine

## 2024-03-01 ENCOUNTER — Ambulatory Visit: Admitting: Family Medicine

## 2024-03-01 ENCOUNTER — Other Ambulatory Visit (HOSPITAL_COMMUNITY): Payer: Self-pay

## 2024-03-01 VITALS — BP 128/68 | HR 66 | Ht 67.0 in | Wt 167.8 lb

## 2024-03-01 DIAGNOSIS — E785 Hyperlipidemia, unspecified: Secondary | ICD-10-CM

## 2024-03-01 DIAGNOSIS — E1165 Type 2 diabetes mellitus with hyperglycemia: Secondary | ICD-10-CM

## 2024-03-01 DIAGNOSIS — E1159 Type 2 diabetes mellitus with other circulatory complications: Secondary | ICD-10-CM | POA: Diagnosis not present

## 2024-03-01 DIAGNOSIS — E1169 Type 2 diabetes mellitus with other specified complication: Secondary | ICD-10-CM

## 2024-03-01 DIAGNOSIS — I25110 Atherosclerotic heart disease of native coronary artery with unstable angina pectoris: Secondary | ICD-10-CM | POA: Diagnosis not present

## 2024-03-01 DIAGNOSIS — I152 Hypertension secondary to endocrine disorders: Secondary | ICD-10-CM | POA: Diagnosis not present

## 2024-03-01 LAB — POCT GLYCOSYLATED HEMOGLOBIN (HGB A1C): HbA1c, POC (controlled diabetic range): 6.5 % (ref 0.0–7.0)

## 2024-03-01 MED ORDER — SEMAGLUTIDE(0.25 OR 0.5MG/DOS) 2 MG/3ML ~~LOC~~ SOPN
0.2500 mg | PEN_INJECTOR | SUBCUTANEOUS | 7 refills | Status: AC
  Filled 2024-03-01: qty 3, 56d supply, fill #0

## 2024-03-01 NOTE — Assessment & Plan Note (Addendum)
-   recently saw cardiology who increased coreg  to 6.25 mg BID, continue taking jardiance   - recent echo showed EF 50-55% with Grade I diastolic dysfunction, per cardiology, no concern at this time.

## 2024-03-01 NOTE — Progress Notes (Signed)
" ° ° °  SUBJECTIVE:   CHIEF COMPLAINT / HPI:   Patient presents for follow up for chronic conditions.  HTN: has been at goal, today SBP mildly elevated to 140, currently on azor  5-40 mg   HLD/CAD: LDL above goal at 104, goal <70, recently saw cardiology and prescribed zetia  10 mg, previously on atorvastatin  80 mg. Also increased coreg  6.25 BID   DMII: last A1c 6.4, on ozempic  0.25 mg and jardiance  10 mg   PERTINENT  PMH / PSH:  Reviewed   OBJECTIVE:   BP 128/68   Pulse 66   Ht 5' 7 (1.702 m)   Wt 167 lb 12.8 oz (76.1 kg)   SpO2 95%   BMI 26.28 kg/m   General: A&O, NAD HEENT: No sign of trauma, EOM grossly intact Cardiac: RRR, systolic murmur  Respiratory: normal WOB GI: non-distended  Extremities: no peripheral edema. Neuro: Normal gait, moves all four extremities appropriately Skin: no lesions/rashes visualized Psych: Appropriate mood and affect   ASSESSMENT/PLAN:   Assessment & Plan Type 2 diabetes mellitus with hyperglycemia, without long-term current use of insulin  (HCC) A1C today 6.5. Urine Microalbumin/Cr obtained.  - Continue ozempic  0.25 mg weekly - continue jardiance  10 mg daily  - patient to see nephrology for CKD Hypertension associated with diabetes (HCC) Bps have been at goal. On repeat, WNL. - continue azor  5-40 mg  Coronary artery disease involving native coronary artery of native heart with unstable angina pectoris (HCC) - recently saw cardiology who increased coreg  to 6.25 mg BID, continue taking jardiance   - recent echo showed EF 50-55% with Grade I diastolic dysfunction, per cardiology, no concern at this time.  Hyperlipidemia associated with type 2 diabetes mellitus (HCC) - recent LDL above goal at 104 (goal: <70), cardiology added zetia  10 mg. Continue atorvastatin  80 mg and zetia  10 mg    Heath Maintenance: - mammogram scheduled for tomorrow  3 month follow up.   Gloriann Ogren, MD [facility] "

## 2024-03-01 NOTE — Assessment & Plan Note (Addendum)
-   recent LDL above goal at 104 (goal: <70), cardiology added zetia  10 mg. Continue atorvastatin  80 mg and zetia  10 mg

## 2024-03-01 NOTE — Assessment & Plan Note (Addendum)
 Bps have been at goal. On repeat, WNL. - continue azor  5-40 mg

## 2024-03-01 NOTE — Patient Instructions (Addendum)
 It was wonderful to see you today.  Please bring ALL of your medications with you to every visit.   Today we talked about:  Lets try getting out of the house and active! Here is the address to the adult center:  [facility] [address] [place], [place] [address] [phone]  Your chronic conditions:  Hypertension: lets continue your current medications as prescribed   Hyperlipidemia/Coronary Artery Disease: you were put on new medications by your heart doctor, we will keep these. He also sent a referral for you to see a kidney doctor, please call them.   Diabetes: Your A1C is within goal, lets keep your current medications - Ozempic  0.25 mg - Jardiance  10 mg   I will see you in about three months time   Thank you for choosing [facility].   Please call [phone] with any questions about today's appointment.  Please arrive at least 15 minutes prior to your scheduled appointments.   If you had blood work today, I will send you a MyChart message or a letter if results are normal. Otherwise, I will give you a call.   If you had a referral placed, they will call you to set up an appointment. Please give us  a call if you don't hear back in the next 2 weeks.   If you need additional refills before your next appointment, please call your pharmacy first.   Do you need your medications delivered to your home?   Well send your prescription to the [facility] for delivery.          Address: [address] [place], [place], [place] [address]          Phone: [phone]  Please call the [facility] to speak with a pharmacist and set up your home medication delivery. If you have any questions, feel free to contact us  -- were happy to help!  Other [place] [facility] that offer affordable prices on both prescriptions and over-the-counter items, as well as convenient services like vaccinations, are  [facility], at [facility]         Address:  [address], [place], [place] [address]         Phone: [phone]  [facility], located in the [facility]        Address: [address] [place], [place] [address]        Phone: [phone]  [facility], at [facility]       Address: [address], [place], [place] [address]       Phone: [phone]  [facility], at [facility]       Address: [address], [place], [place] [address]       Phone: [phone]  You should follow up in our clinic in [place] in about 3 months (around 05/30/2024).  Gloriann Ogren, MD Family Medicine

## 2024-03-02 ENCOUNTER — Other Ambulatory Visit (HOSPITAL_COMMUNITY): Payer: Self-pay

## 2024-03-02 ENCOUNTER — Other Ambulatory Visit: Payer: Self-pay

## 2024-03-02 ENCOUNTER — Ambulatory Visit
Admission: RE | Admit: 2024-03-02 | Discharge: 2024-03-02 | Disposition: A | Discharge status: Home / Self Care | Source: Ambulatory Visit

## 2024-03-02 ENCOUNTER — Other Ambulatory Visit: Payer: Self-pay | Admitting: Family Medicine

## 2024-03-02 DIAGNOSIS — Z1231 Encounter for screening mammogram for malignant neoplasm of breast: Secondary | ICD-10-CM

## 2024-03-02 LAB — MICROALBUMIN / CREATININE URINE RATIO
Creatinine, Urine: 131.3 mg/dL
Microalb/Creat Ratio: 220 mg/g{creat} — ABNORMAL HIGH (ref 0–29)
Microalbumin, Urine: 288.9 ug/mL

## 2024-03-03 ENCOUNTER — Other Ambulatory Visit (HOSPITAL_COMMUNITY): Payer: Self-pay

## 2024-03-03 ENCOUNTER — Other Ambulatory Visit: Payer: Self-pay

## 2024-03-03 ENCOUNTER — Ambulatory Visit: Payer: Self-pay | Admitting: Family Medicine

## 2024-03-03 MED ORDER — EMPAGLIFLOZIN 10 MG PO TABS
10.0000 mg | ORAL_TABLET | Freq: Every morning | ORAL | 0 refills | Status: DC
  Filled 2024-03-03: qty 30, 30d supply, fill #0

## 2024-03-04 ENCOUNTER — Telehealth: Payer: Self-pay

## 2024-03-04 NOTE — Telephone Encounter (Signed)
 QBP:Heather [name] is scheduled 03/17/24 for [facility]  treatment. I will need patient's new insurance information before patient can receive treatment. Patient's policy terminated 02/25/24 according to [employer]  benefits inquiry results. I called and spoke with patient she is going to bring new insurance information to the office on 03/05/24.

## 2024-03-05 ENCOUNTER — Other Ambulatory Visit (HOSPITAL_COMMUNITY): Payer: Self-pay

## 2024-03-12 ENCOUNTER — Other Ambulatory Visit: Payer: Self-pay

## 2024-03-12 ENCOUNTER — Other Ambulatory Visit (HOSPITAL_COMMUNITY): Payer: Self-pay

## 2024-03-12 DIAGNOSIS — N1832 Chronic kidney disease, stage 3b: Secondary | ICD-10-CM

## 2024-03-12 MED ORDER — EMPAGLIFLOZIN 25 MG PO TABS
25.0000 mg | ORAL_TABLET | Freq: Every day | ORAL | 3 refills | Status: AC
  Filled 2024-03-12: qty 90, 90d supply, fill #0
  Filled 2024-03-31 – 2024-04-02 (×2): qty 30, 30d supply, fill #0

## 2024-03-13 LAB — LAB REPORT - SCANNED
Albumin, Urine POC: 223.4
Creatinine, POC: 156 mg/dL
EGFR: 37
Microalb Creat Ratio: 143

## 2024-03-15 ENCOUNTER — Other Ambulatory Visit (HOSPITAL_COMMUNITY): Payer: Self-pay

## 2024-03-15 MED ORDER — VITAMIN D (ERGOCALCIFEROL) 1.25 MG (50000 UNIT) PO CAPS
50000.0000 [IU] | ORAL_CAPSULE | ORAL | 0 refills | Status: AC
  Filled 2024-03-15 – 2024-03-19 (×2): qty 8, 56d supply, fill #0

## 2024-03-17 ENCOUNTER — Ambulatory Visit: Payer: Self-pay | Admitting: Podiatry

## 2024-03-19 ENCOUNTER — Other Ambulatory Visit (HOSPITAL_COMMUNITY): Payer: Self-pay

## 2024-03-19 MED ORDER — NITROFURANTOIN MONOHYD MACRO 100 MG PO CAPS
100.0000 mg | ORAL_CAPSULE | Freq: Two times a day (BID) | ORAL | 0 refills | Status: AC
  Filled 2024-03-19: qty 10, 5d supply, fill #0

## 2024-03-25 ENCOUNTER — Other Ambulatory Visit (HOSPITAL_COMMUNITY): Payer: Self-pay

## 2024-03-25 ENCOUNTER — Ambulatory Visit
Admission: RE | Admit: 2024-03-25 | Discharge: 2024-03-25 | Disposition: A | Discharge status: Home / Self Care | Source: Ambulatory Visit

## 2024-03-25 DIAGNOSIS — N1832 Chronic kidney disease, stage 3b: Secondary | ICD-10-CM

## 2024-03-31 ENCOUNTER — Other Ambulatory Visit (HOSPITAL_COMMUNITY): Payer: Self-pay

## 2024-03-31 ENCOUNTER — Other Ambulatory Visit: Payer: Self-pay

## 2024-03-31 ENCOUNTER — Other Ambulatory Visit: Payer: Self-pay | Admitting: Family Medicine

## 2024-03-31 DIAGNOSIS — I6381 Other cerebral infarction due to occlusion or stenosis of small artery: Secondary | ICD-10-CM

## 2024-03-31 DIAGNOSIS — E1139 Type 2 diabetes mellitus with other diabetic ophthalmic complication: Secondary | ICD-10-CM

## 2024-04-01 ENCOUNTER — Other Ambulatory Visit: Payer: Self-pay

## 2024-04-01 ENCOUNTER — Other Ambulatory Visit (HOSPITAL_COMMUNITY): Payer: Self-pay

## 2024-04-02 ENCOUNTER — Other Ambulatory Visit: Payer: Self-pay

## 2024-04-02 ENCOUNTER — Other Ambulatory Visit: Payer: Self-pay | Admitting: Family Medicine

## 2024-04-02 ENCOUNTER — Other Ambulatory Visit (HOSPITAL_COMMUNITY): Payer: Self-pay

## 2024-04-02 DIAGNOSIS — I6381 Other cerebral infarction due to occlusion or stenosis of small artery: Secondary | ICD-10-CM

## 2024-04-02 DIAGNOSIS — E1139 Type 2 diabetes mellitus with other diabetic ophthalmic complication: Secondary | ICD-10-CM

## 2024-04-02 MED ORDER — AMLODIPINE-OLMESARTAN 5-40 MG PO TABS
1.0000 | ORAL_TABLET | Freq: Every day | ORAL | 3 refills | Status: AC
  Filled 2024-04-02: qty 30, 30d supply, fill #0

## 2024-04-02 MED ORDER — APIXABAN 5 MG PO TABS
5.0000 mg | ORAL_TABLET | Freq: Two times a day (BID) | ORAL | 3 refills | Status: AC
  Filled 2024-04-02: qty 60, 30d supply, fill #0

## 2024-04-02 MED ORDER — ATORVASTATIN CALCIUM 80 MG PO TABS
80.0000 mg | ORAL_TABLET | Freq: Every day | ORAL | 3 refills | Status: AC
  Filled 2024-04-02: qty 30, 30d supply, fill #0
